# Patient Record
Sex: Female | Born: 1975 | Race: Black or African American | Hispanic: No | Marital: Single | Smoking: Former smoker
Health system: Southern US, Community
[De-identification: ages and names within clinical notes are randomized; demographics above are authoritative.]

## PROBLEM LIST (undated history)

## (undated) DIAGNOSIS — R519 Headache, unspecified: Secondary | ICD-10-CM

## (undated) DIAGNOSIS — G8929 Other chronic pain: Secondary | ICD-10-CM

## (undated) DIAGNOSIS — R011 Cardiac murmur, unspecified: Secondary | ICD-10-CM

## (undated) DIAGNOSIS — R51 Headache: Secondary | ICD-10-CM

## (undated) DIAGNOSIS — F419 Anxiety disorder, unspecified: Secondary | ICD-10-CM

## (undated) DIAGNOSIS — G4730 Sleep apnea, unspecified: Secondary | ICD-10-CM

## (undated) DIAGNOSIS — M1990 Unspecified osteoarthritis, unspecified site: Secondary | ICD-10-CM

## (undated) DIAGNOSIS — I639 Cerebral infarction, unspecified: Secondary | ICD-10-CM

## (undated) DIAGNOSIS — N946 Dysmenorrhea, unspecified: Secondary | ICD-10-CM

## (undated) DIAGNOSIS — D649 Anemia, unspecified: Secondary | ICD-10-CM

## (undated) HISTORY — DX: Dysmenorrhea, unspecified: N94.6

## (undated) HISTORY — DX: Headache, unspecified: R51.9

## (undated) HISTORY — PX: TUBAL LIGATION: SHX77

## (undated) HISTORY — DX: Headache: R51

## (undated) HISTORY — DX: Other chronic pain: G89.29

## (undated) HISTORY — DX: Cardiac murmur, unspecified: R01.1

## (undated) HISTORY — DX: Anemia, unspecified: D64.9

---

## 1998-01-20 ENCOUNTER — Emergency Department (HOSPITAL_COMMUNITY): Admission: EM | Admit: 1998-01-20 | Discharge: 1998-01-20 | Payer: Self-pay | Admitting: Emergency Medicine

## 1998-08-10 ENCOUNTER — Emergency Department (HOSPITAL_COMMUNITY): Admission: EM | Admit: 1998-08-10 | Discharge: 1998-08-10 | Payer: Self-pay | Admitting: Emergency Medicine

## 1998-12-23 ENCOUNTER — Emergency Department (HOSPITAL_COMMUNITY): Admission: EM | Admit: 1998-12-23 | Discharge: 1998-12-24 | Payer: Self-pay

## 1999-01-05 ENCOUNTER — Emergency Department (HOSPITAL_COMMUNITY): Admission: EM | Admit: 1999-01-05 | Discharge: 1999-01-05 | Payer: Self-pay | Admitting: Emergency Medicine

## 1999-01-19 ENCOUNTER — Inpatient Hospital Stay (HOSPITAL_COMMUNITY): Admission: AD | Admit: 1999-01-19 | Discharge: 1999-01-19 | Payer: Self-pay | Admitting: Obstetrics

## 1999-01-28 ENCOUNTER — Inpatient Hospital Stay (HOSPITAL_COMMUNITY): Admission: EM | Admit: 1999-01-28 | Discharge: 1999-01-28 | Payer: Self-pay | Admitting: Obstetrics

## 1999-09-06 ENCOUNTER — Inpatient Hospital Stay (HOSPITAL_COMMUNITY): Admission: AD | Admit: 1999-09-06 | Discharge: 1999-09-06 | Payer: Self-pay | Admitting: *Deleted

## 1999-11-18 ENCOUNTER — Encounter: Payer: Self-pay | Admitting: Obstetrics & Gynecology

## 1999-11-18 ENCOUNTER — Inpatient Hospital Stay (HOSPITAL_COMMUNITY): Admission: AD | Admit: 1999-11-18 | Discharge: 1999-11-18 | Payer: Self-pay | Admitting: *Deleted

## 1999-12-01 ENCOUNTER — Inpatient Hospital Stay (HOSPITAL_COMMUNITY): Admission: AD | Admit: 1999-12-01 | Discharge: 1999-12-01 | Payer: Self-pay | Admitting: Obstetrics & Gynecology

## 2000-01-30 ENCOUNTER — Emergency Department (HOSPITAL_COMMUNITY): Admission: EM | Admit: 2000-01-30 | Discharge: 2000-01-30 | Payer: Self-pay | Admitting: Emergency Medicine

## 2000-01-30 ENCOUNTER — Encounter: Payer: Self-pay | Admitting: Emergency Medicine

## 2000-02-03 ENCOUNTER — Inpatient Hospital Stay (HOSPITAL_COMMUNITY): Admission: AD | Admit: 2000-02-03 | Discharge: 2000-02-07 | Payer: Self-pay | Admitting: Obstetrics

## 2000-03-16 ENCOUNTER — Ambulatory Visit (HOSPITAL_COMMUNITY): Admission: RE | Admit: 2000-03-16 | Discharge: 2000-03-16 | Payer: Self-pay | Admitting: Obstetrics

## 2000-03-18 ENCOUNTER — Inpatient Hospital Stay (HOSPITAL_COMMUNITY): Admission: AD | Admit: 2000-03-18 | Discharge: 2000-03-18 | Payer: Self-pay | Admitting: Obstetrics

## 2000-03-25 ENCOUNTER — Observation Stay (HOSPITAL_COMMUNITY): Admission: AD | Admit: 2000-03-25 | Discharge: 2000-03-25 | Payer: Self-pay | Admitting: *Deleted

## 2000-03-25 ENCOUNTER — Encounter: Payer: Self-pay | Admitting: *Deleted

## 2000-04-01 ENCOUNTER — Inpatient Hospital Stay (HOSPITAL_COMMUNITY): Admission: AD | Admit: 2000-04-01 | Discharge: 2000-04-01 | Payer: Self-pay | Admitting: Orthopedic Surgery

## 2000-04-04 ENCOUNTER — Inpatient Hospital Stay (HOSPITAL_COMMUNITY): Admission: AD | Admit: 2000-04-04 | Discharge: 2000-04-04 | Payer: Self-pay | Admitting: *Deleted

## 2000-04-11 ENCOUNTER — Inpatient Hospital Stay (HOSPITAL_COMMUNITY): Admission: AD | Admit: 2000-04-11 | Discharge: 2000-04-14 | Payer: Self-pay | Admitting: Obstetrics

## 2000-04-13 ENCOUNTER — Encounter (INDEPENDENT_AMBULATORY_CARE_PROVIDER_SITE_OTHER): Payer: Self-pay

## 2000-07-20 ENCOUNTER — Emergency Department (HOSPITAL_COMMUNITY): Admission: EM | Admit: 2000-07-20 | Discharge: 2000-07-20 | Payer: Self-pay | Admitting: Emergency Medicine

## 2000-11-17 ENCOUNTER — Encounter: Payer: Self-pay | Admitting: Emergency Medicine

## 2000-11-17 ENCOUNTER — Emergency Department (HOSPITAL_COMMUNITY): Admission: EM | Admit: 2000-11-17 | Discharge: 2000-11-17 | Payer: Self-pay | Admitting: Emergency Medicine

## 2001-10-25 ENCOUNTER — Emergency Department (HOSPITAL_COMMUNITY): Admission: EM | Admit: 2001-10-25 | Discharge: 2001-10-25 | Payer: Self-pay | Admitting: Emergency Medicine

## 2001-12-22 ENCOUNTER — Encounter: Payer: Self-pay | Admitting: *Deleted

## 2001-12-22 ENCOUNTER — Emergency Department (HOSPITAL_COMMUNITY): Admission: EM | Admit: 2001-12-22 | Discharge: 2001-12-22 | Payer: Self-pay | Admitting: *Deleted

## 2002-02-16 ENCOUNTER — Emergency Department (HOSPITAL_COMMUNITY): Admission: EM | Admit: 2002-02-16 | Discharge: 2002-02-16 | Payer: Self-pay | Admitting: Emergency Medicine

## 2002-02-16 ENCOUNTER — Encounter: Payer: Self-pay | Admitting: Emergency Medicine

## 2002-02-19 ENCOUNTER — Emergency Department (HOSPITAL_COMMUNITY): Admission: EM | Admit: 2002-02-19 | Discharge: 2002-02-19 | Payer: Self-pay | Admitting: Emergency Medicine

## 2002-06-23 ENCOUNTER — Emergency Department (HOSPITAL_COMMUNITY): Admission: EM | Admit: 2002-06-23 | Discharge: 2002-06-23 | Payer: Self-pay | Admitting: Emergency Medicine

## 2002-07-02 ENCOUNTER — Emergency Department (HOSPITAL_COMMUNITY): Admission: EM | Admit: 2002-07-02 | Discharge: 2002-07-02 | Payer: Self-pay | Admitting: Emergency Medicine

## 2002-12-28 ENCOUNTER — Emergency Department (HOSPITAL_COMMUNITY): Admission: EM | Admit: 2002-12-28 | Discharge: 2002-12-28 | Payer: Self-pay | Admitting: Emergency Medicine

## 2003-01-09 ENCOUNTER — Emergency Department (HOSPITAL_COMMUNITY): Admission: EM | Admit: 2003-01-09 | Discharge: 2003-01-09 | Payer: Self-pay | Admitting: Emergency Medicine

## 2003-07-17 ENCOUNTER — Emergency Department (HOSPITAL_COMMUNITY): Admission: EM | Admit: 2003-07-17 | Discharge: 2003-07-17 | Payer: Self-pay | Admitting: Emergency Medicine

## 2004-06-18 ENCOUNTER — Emergency Department (HOSPITAL_COMMUNITY): Admission: EM | Admit: 2004-06-18 | Discharge: 2004-06-19 | Payer: Self-pay | Admitting: Emergency Medicine

## 2004-06-19 IMAGING — CR DG CHEST 2V
2 series · 2 of 2 positions shown · non-contrast
Comparison: none

CLINICAL DATA: Chest pain

CHEST - 2 VIEW

[view not recorded (1 of 2)]
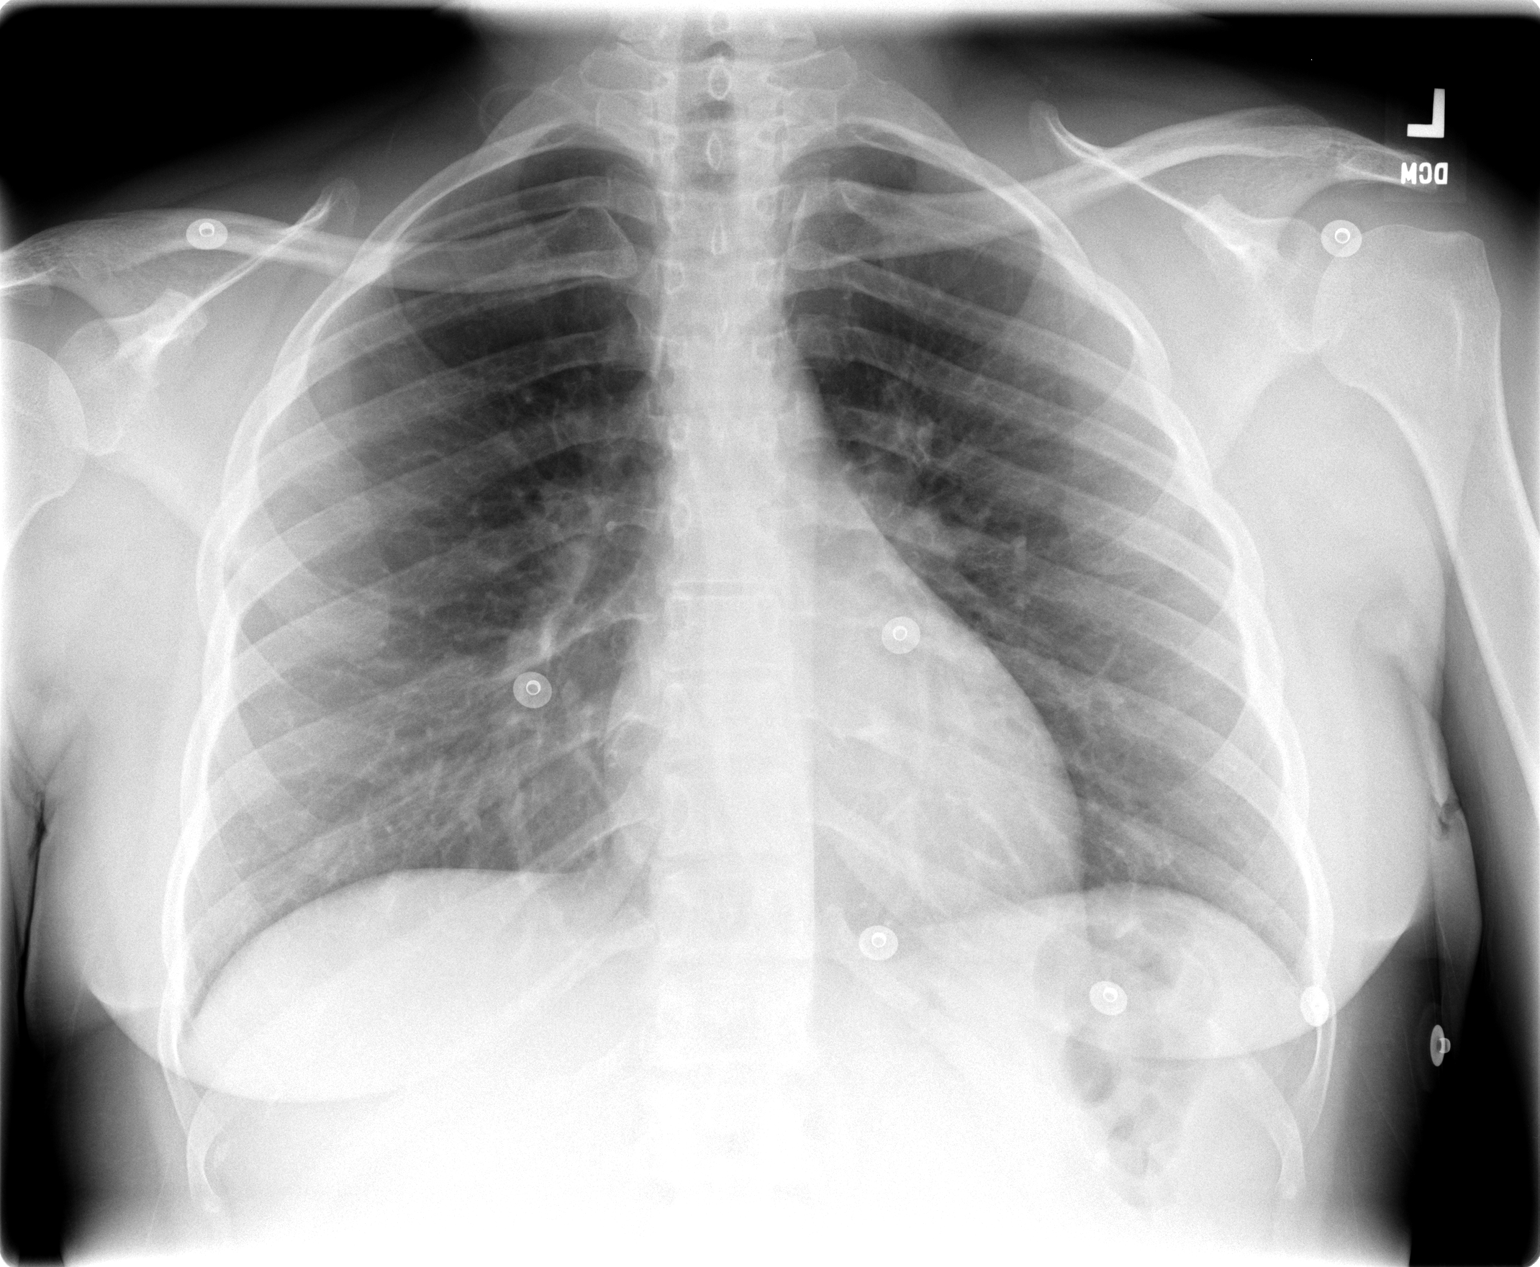

[view not recorded (2 of 2)]
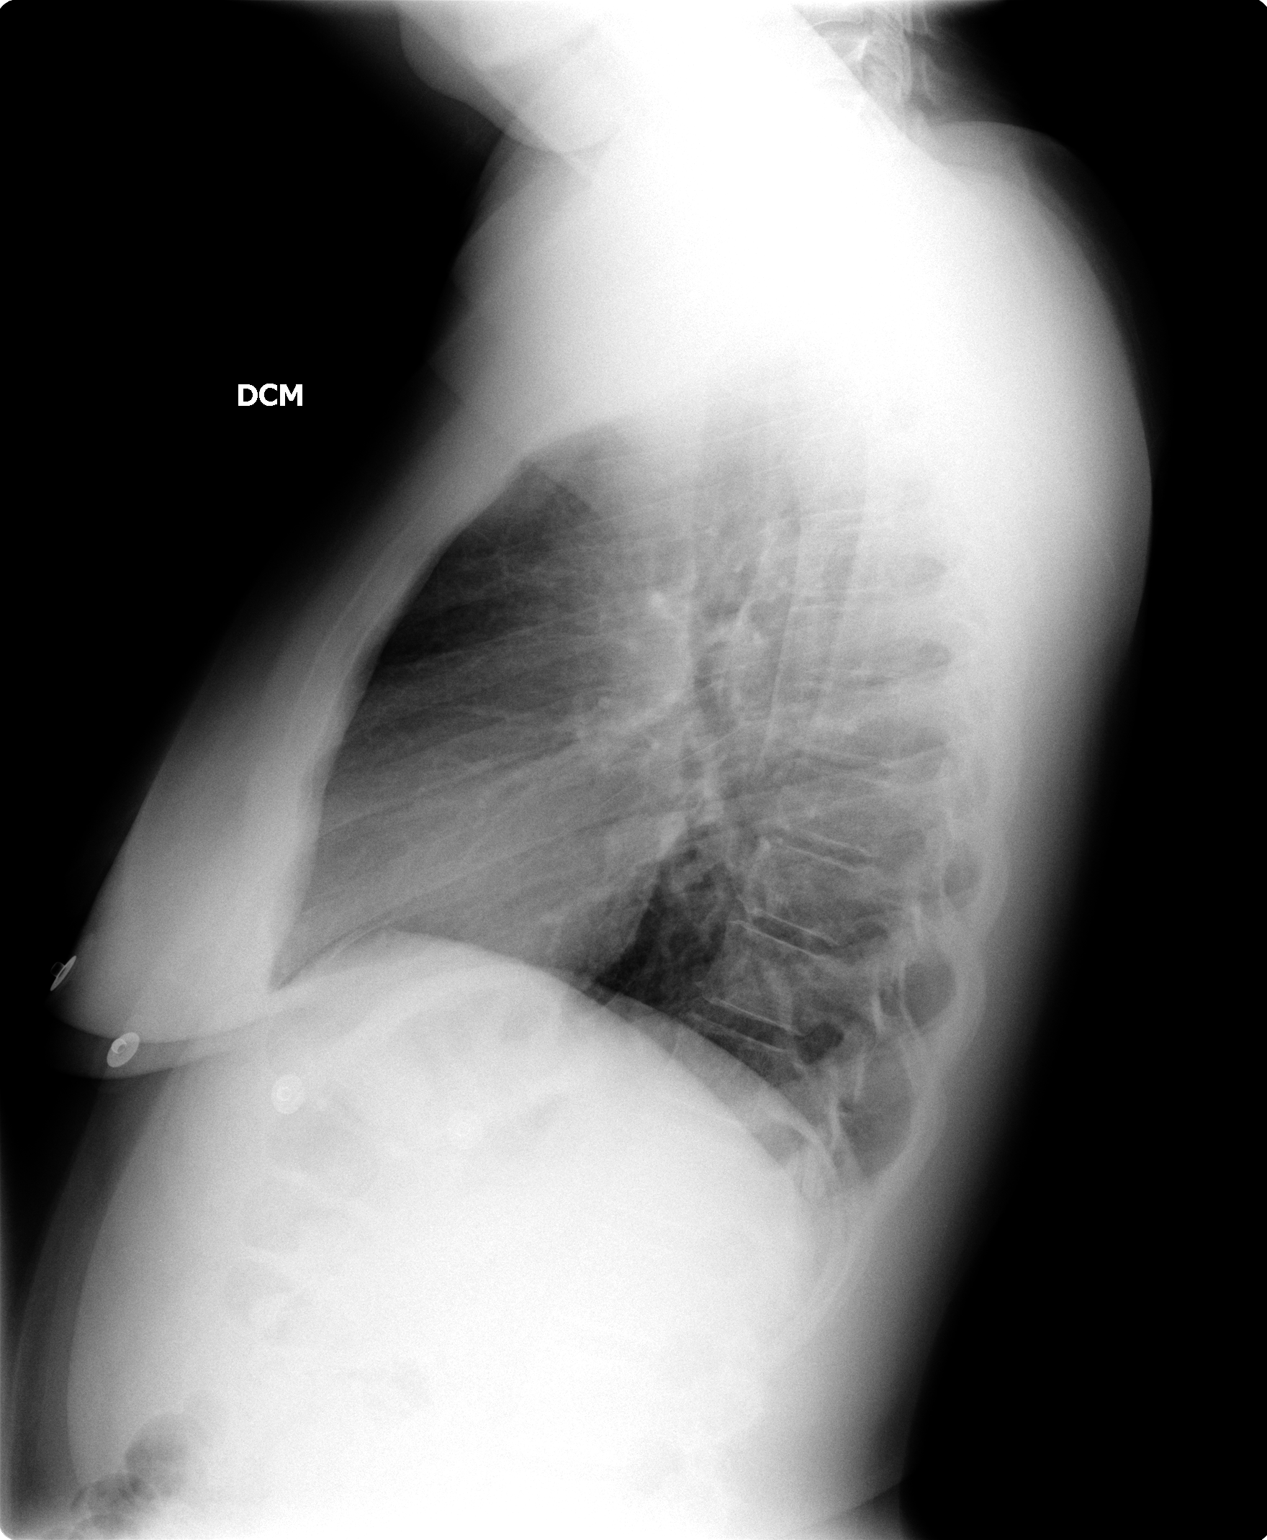

[2 of 2 positions shown; findings below may reference images not displayed]

FINDINGS: The heart size and mediastinal contours are within normal limits.  Both lungs are clear.
The visualized skeletal structures are unremarkable.

IMPRESSION

No active cardiopulmonary disease.

## 2004-10-28 ENCOUNTER — Emergency Department (HOSPITAL_COMMUNITY): Admission: EM | Admit: 2004-10-28 | Discharge: 2004-10-28 | Payer: Self-pay | Admitting: Emergency Medicine

## 2004-10-30 ENCOUNTER — Emergency Department (HOSPITAL_COMMUNITY): Admission: EM | Admit: 2004-10-30 | Discharge: 2004-10-30 | Payer: Self-pay | Admitting: Family Medicine

## 2007-02-25 ENCOUNTER — Emergency Department (HOSPITAL_COMMUNITY): Admission: EM | Admit: 2007-02-25 | Discharge: 2007-02-26 | Payer: Self-pay | Admitting: Emergency Medicine

## 2008-02-25 ENCOUNTER — Emergency Department (HOSPITAL_COMMUNITY): Admission: EM | Admit: 2008-02-25 | Discharge: 2008-02-25 | Payer: Self-pay | Admitting: Family Medicine

## 2009-04-13 ENCOUNTER — Emergency Department (HOSPITAL_COMMUNITY): Admission: EM | Admit: 2009-04-13 | Discharge: 2009-04-13 | Payer: Self-pay | Admitting: Emergency Medicine

## 2009-04-21 ENCOUNTER — Emergency Department (HOSPITAL_COMMUNITY): Admission: EM | Admit: 2009-04-21 | Discharge: 2009-04-21 | Payer: Self-pay | Admitting: Family Medicine

## 2009-04-23 ENCOUNTER — Emergency Department (HOSPITAL_COMMUNITY): Admission: EM | Admit: 2009-04-23 | Discharge: 2009-04-23 | Payer: Self-pay | Admitting: Family Medicine

## 2009-11-21 IMAGING — CR DG KNEE COMPLETE 4+V*L*
4 series · 4 of 4 positions shown · non-contrast
Comparison: None.

CLINICAL DATA: Left knee pain

LEFT KNEE - COMPLETE 4+ VIEW

[t knee ap left]
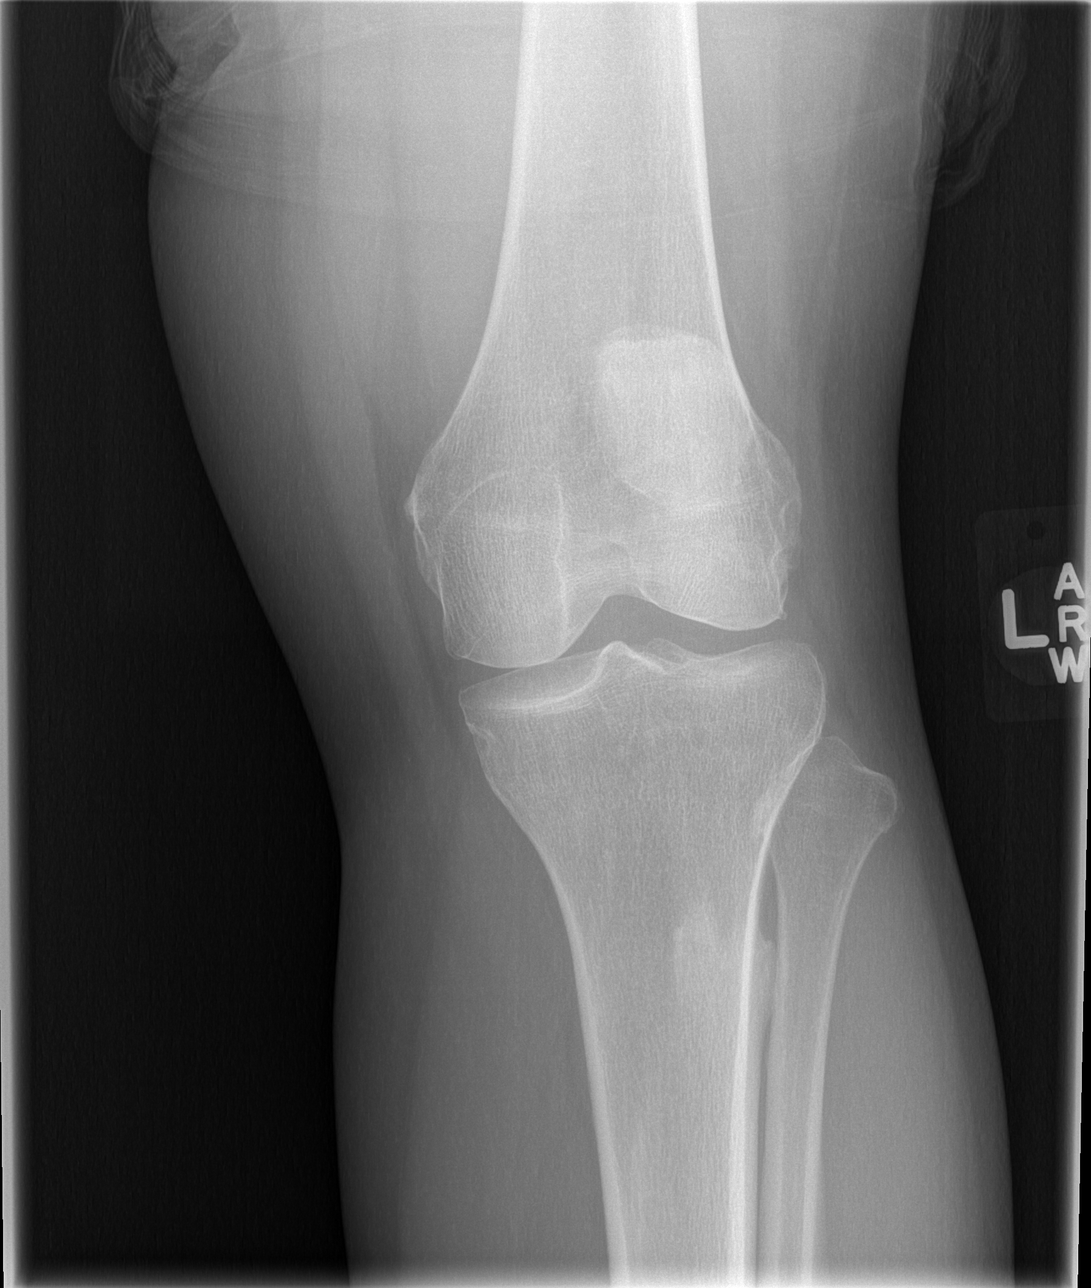

[t knee oblique left (1 of 2)]
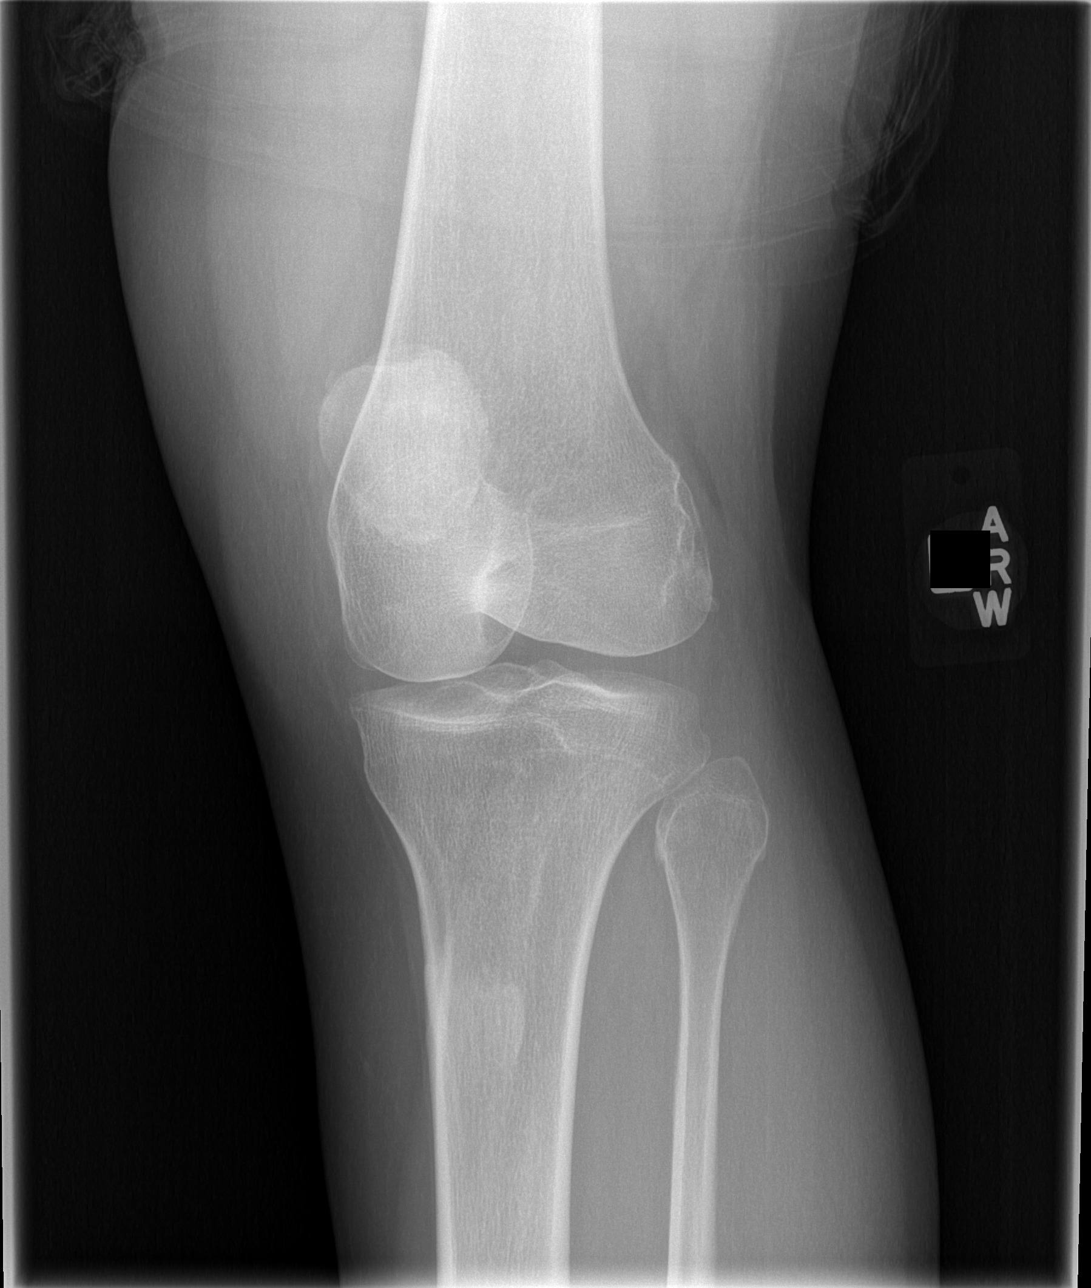

[t knee oblique left (2 of 2)]
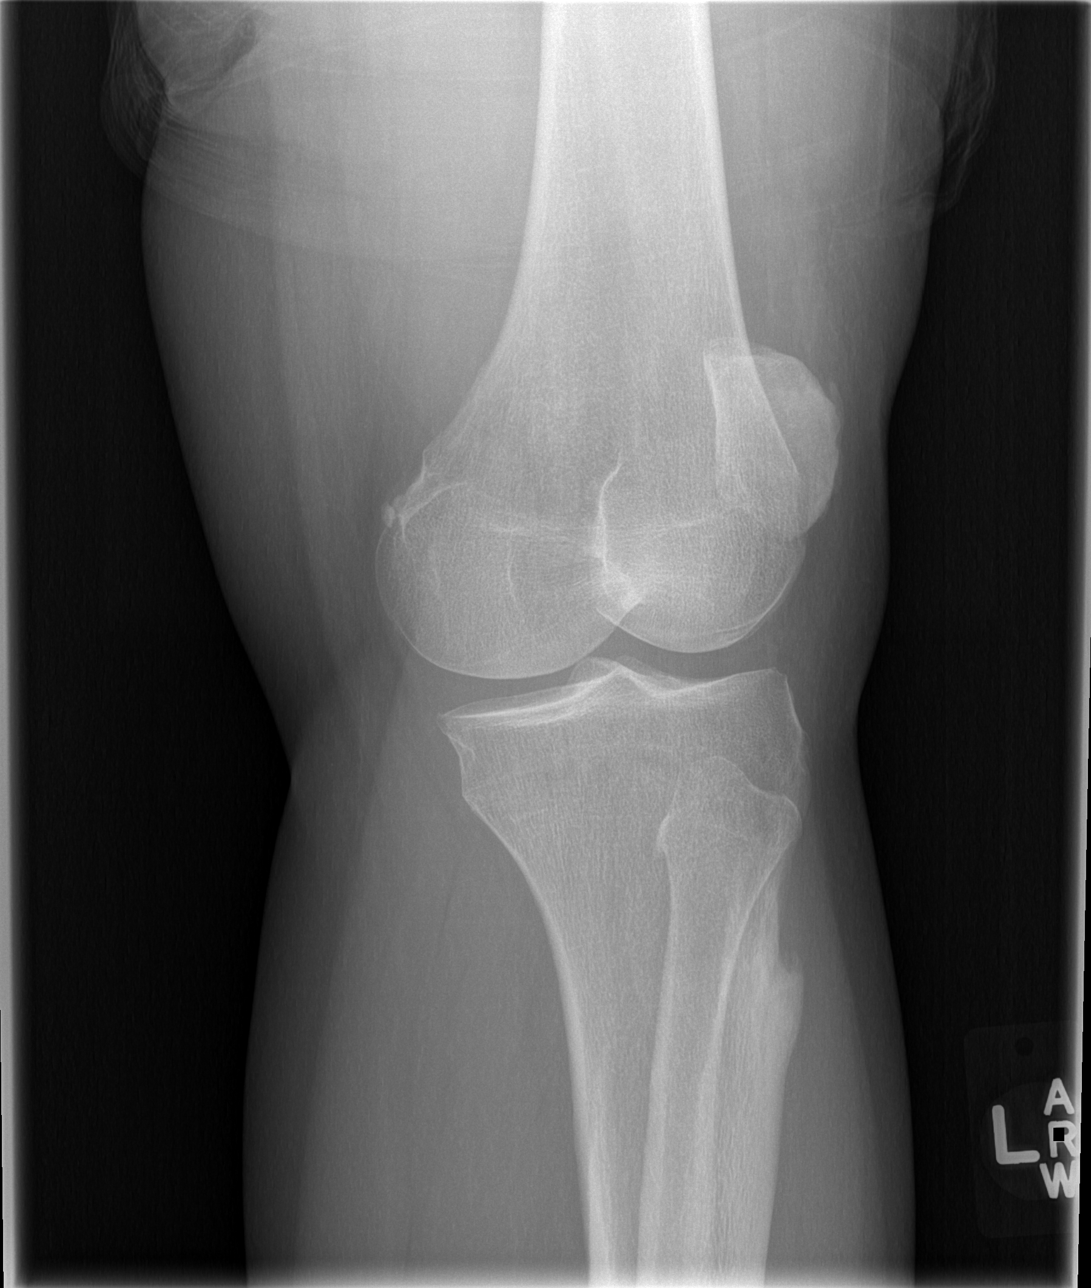

[t knee lat left]
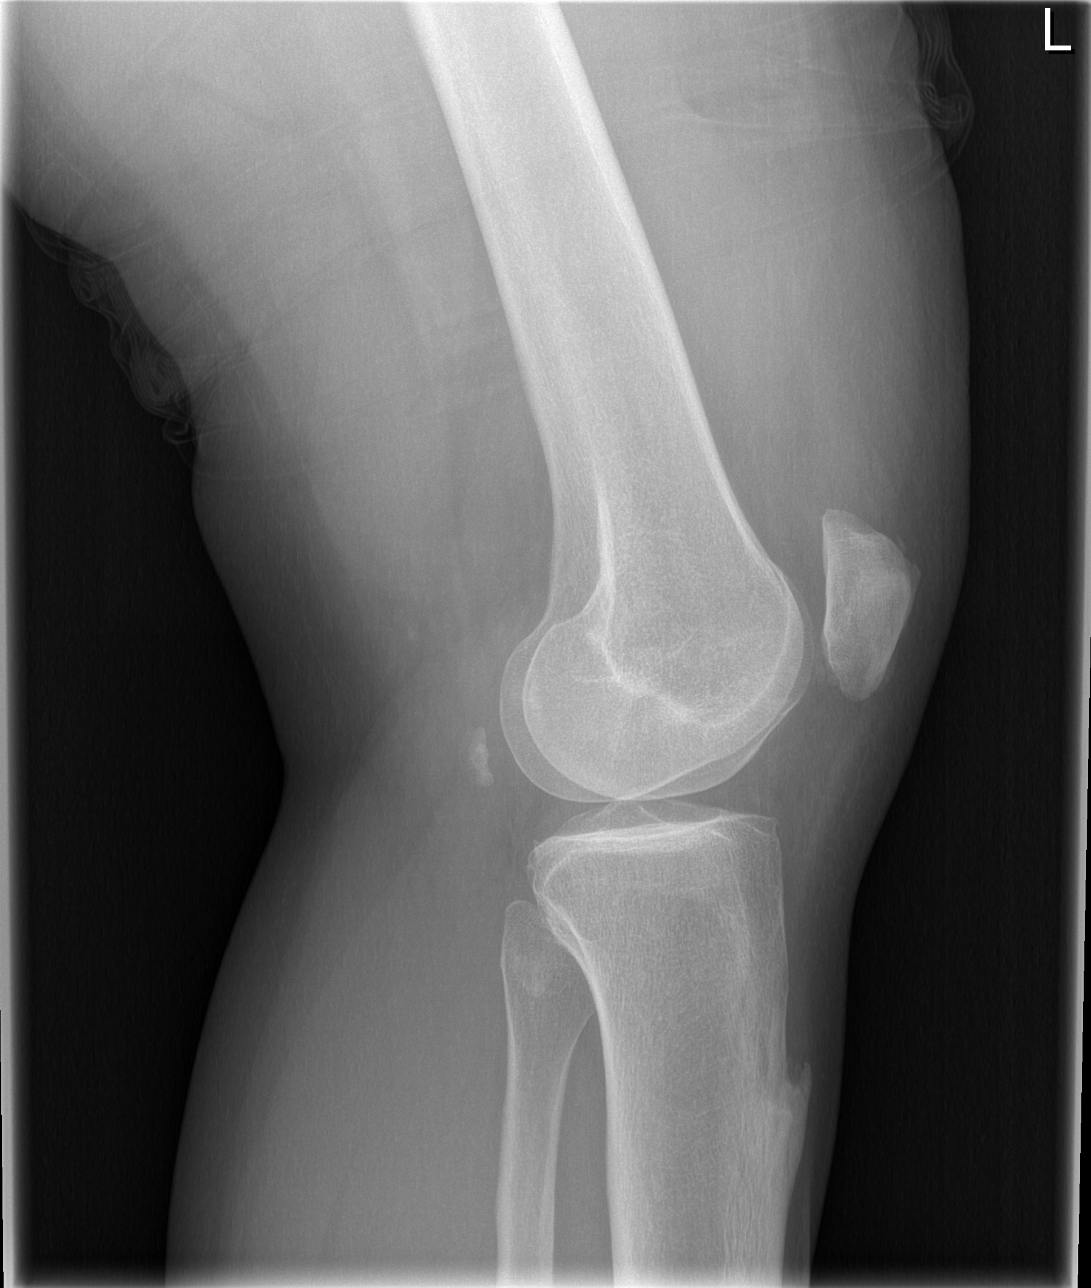

[4 of 4 positions shown; findings below may reference images not displayed]

FINDINGS: Four views of the left knee submitted.  No acute fracture
or subluxation.  Small joint effusion is noted.
IMPRESSION: No acute fracture or subluxation.  Small joint effusion.

## 2010-02-01 ENCOUNTER — Emergency Department (HOSPITAL_COMMUNITY): Admission: EM | Admit: 2010-02-01 | Discharge: 2010-02-01 | Payer: Self-pay | Admitting: Emergency Medicine

## 2010-04-11 ENCOUNTER — Telehealth: Payer: Self-pay | Admitting: Family Medicine

## 2010-09-27 NOTE — Progress Notes (Signed)
Summary: no show for new pt appt  Phone Note Other Incoming   Summary of Call: no show    

## 2010-12-10 ENCOUNTER — Emergency Department (HOSPITAL_COMMUNITY)
Admission: EM | Admit: 2010-12-10 | Discharge: 2010-12-10 | Disposition: A | Discharge status: Home / Self Care | Payer: Worker's Compensation | Attending: Emergency Medicine | Admitting: Emergency Medicine

## 2010-12-10 ENCOUNTER — Emergency Department (HOSPITAL_COMMUNITY): Payer: Worker's Compensation

## 2010-12-10 DIAGNOSIS — Y9269 Other specified industrial and construction area as the place of occurrence of the external cause: Secondary | ICD-10-CM | POA: Insufficient documentation

## 2010-12-10 DIAGNOSIS — S63509A Unspecified sprain of unspecified wrist, initial encounter: Secondary | ICD-10-CM | POA: Insufficient documentation

## 2010-12-10 DIAGNOSIS — Y990 Civilian activity done for income or pay: Secondary | ICD-10-CM | POA: Insufficient documentation

## 2010-12-10 DIAGNOSIS — X500XXA Overexertion from strenuous movement or load, initial encounter: Secondary | ICD-10-CM | POA: Insufficient documentation

## 2010-12-10 IMAGING — CR DG WRIST COMPLETE 3+V*L*
4 series · 4 of 4 positions shown · non-contrast
Comparison: None.

CLINICAL DATA: Hyperextended left wrist during transferred patient.
Radiating pain from the ulnar aspect.

LEFT WRIST - COMPLETE 3+ VIEW

[x wrist pa left]
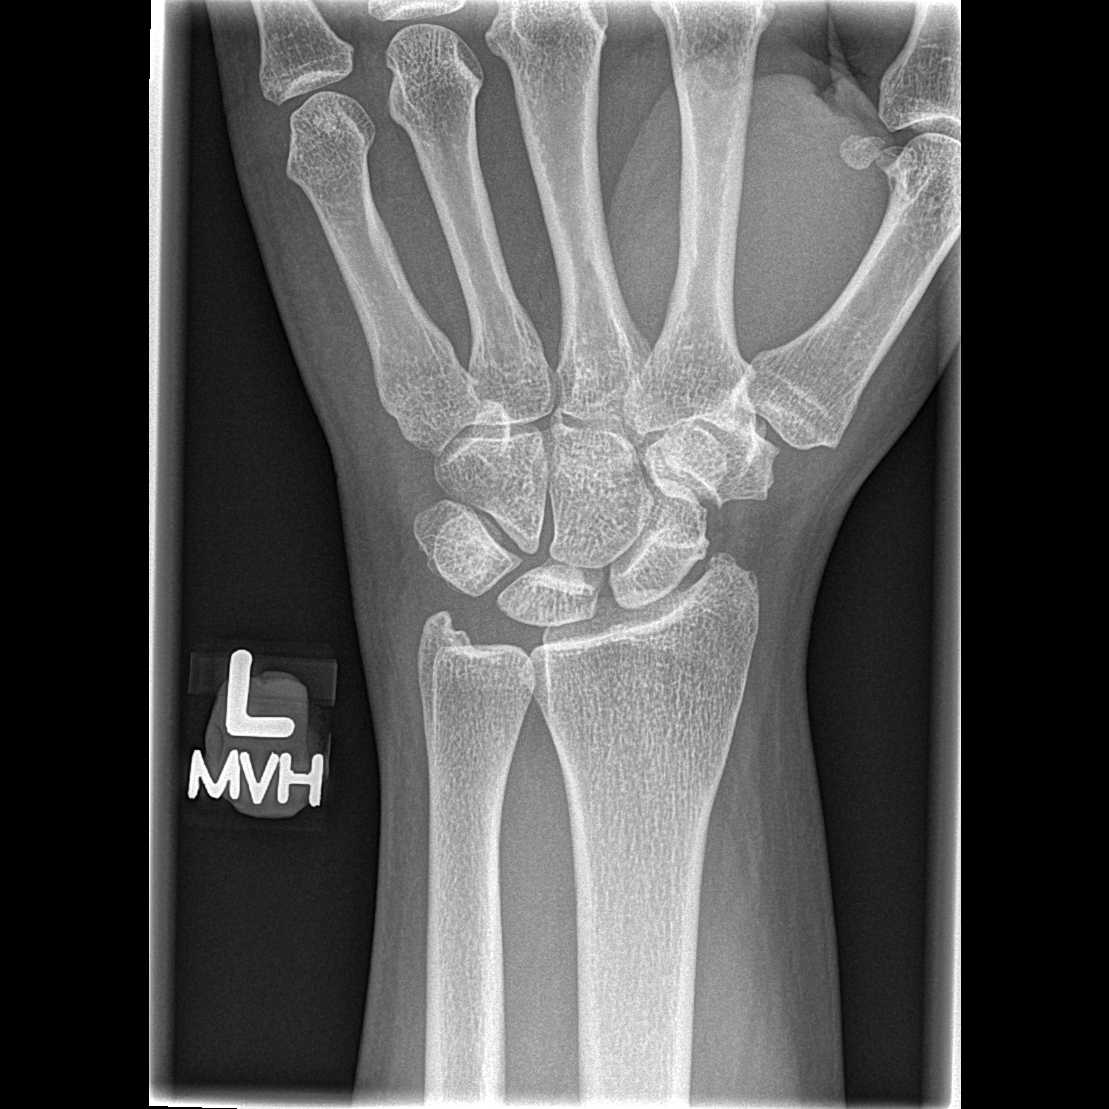

[x wrist obl left]
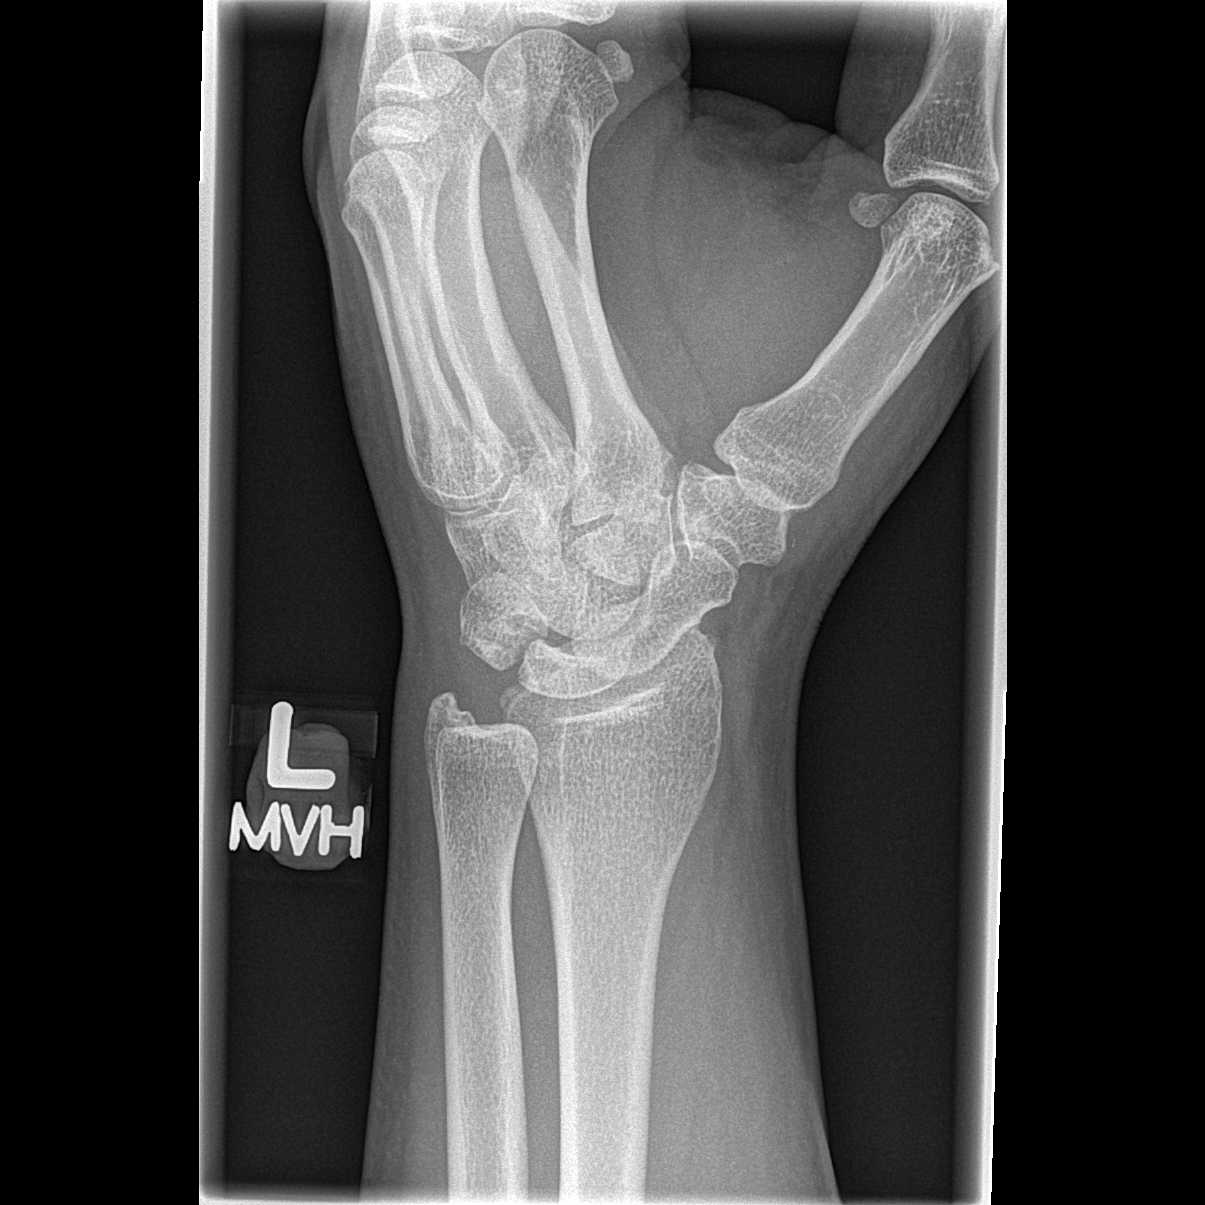

[x wrist lat left]
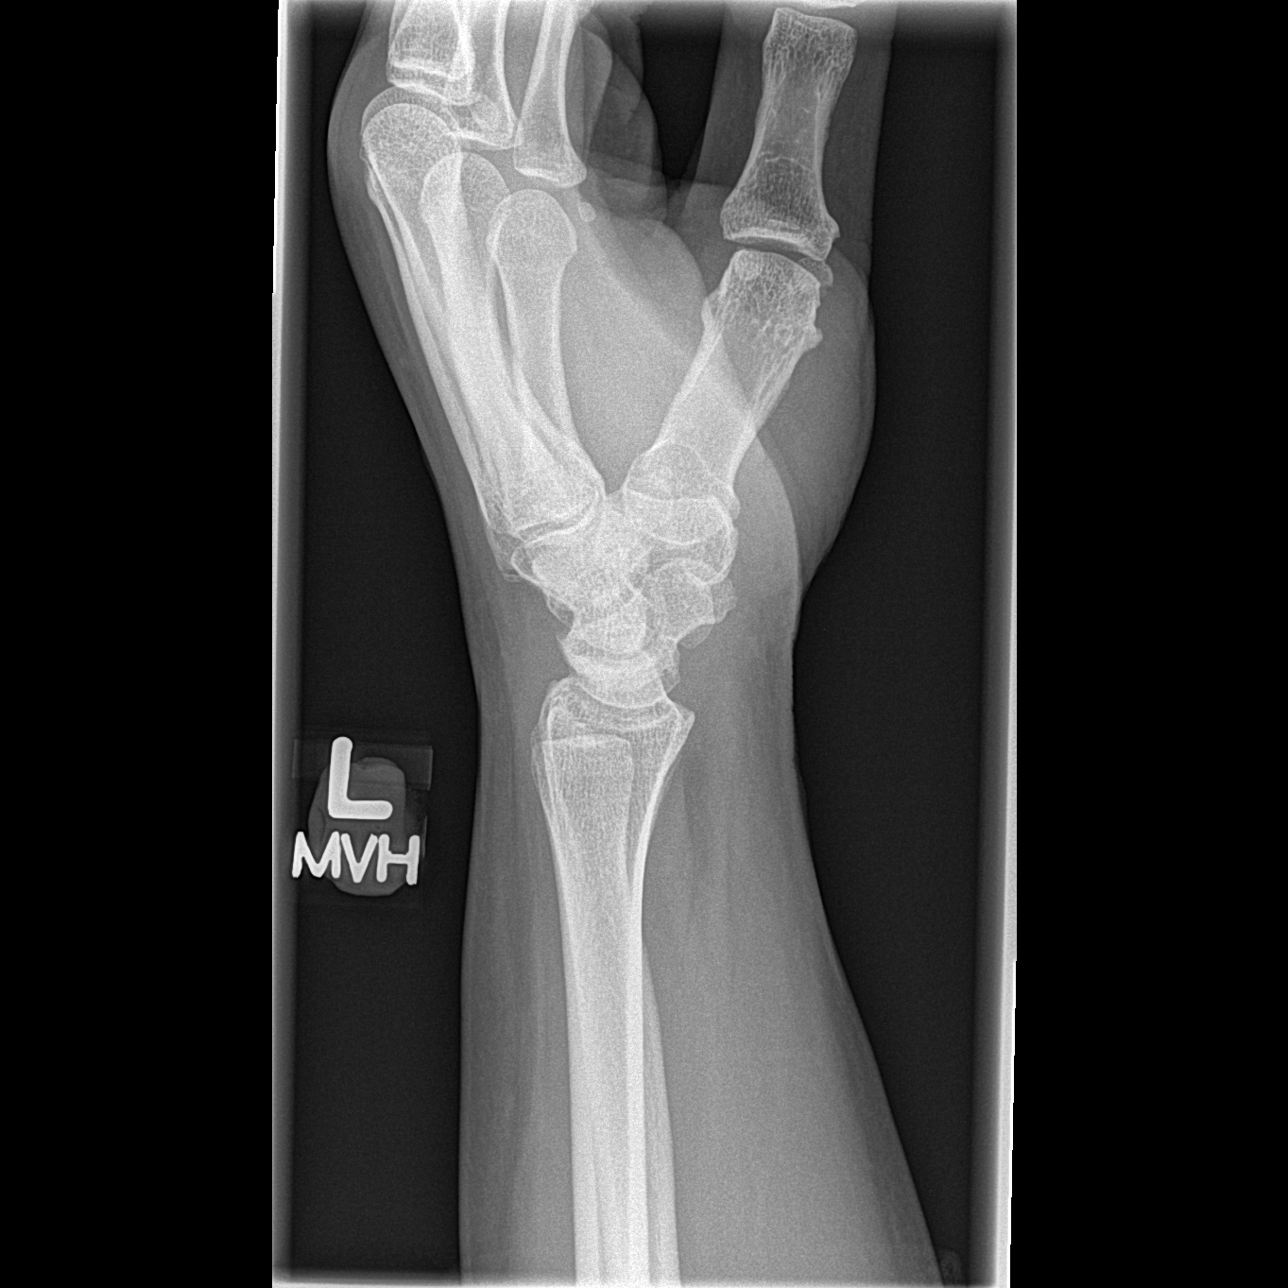

[x wrist navicular]
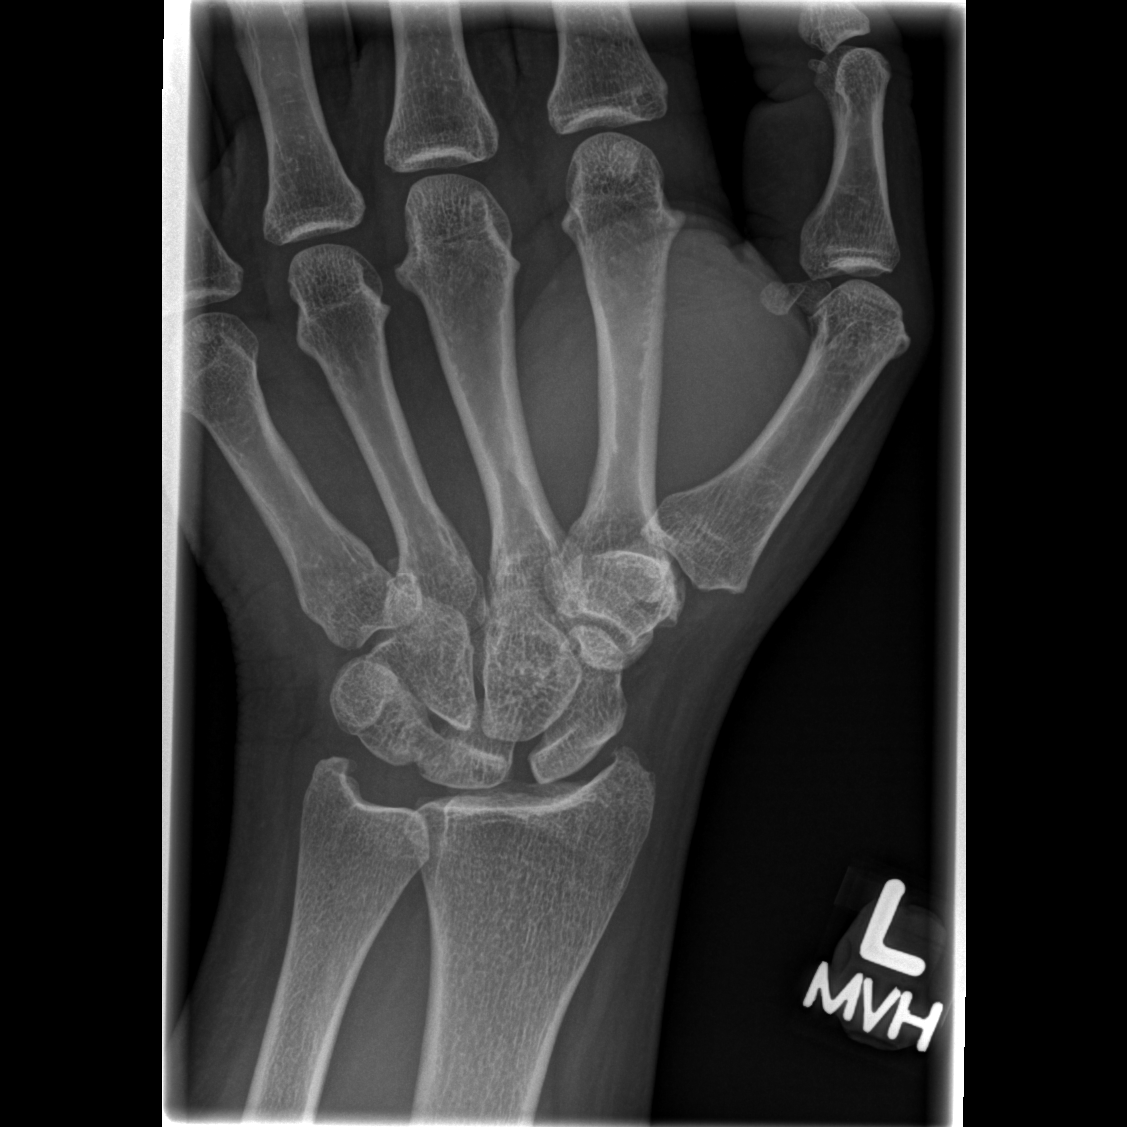

[4 of 4 positions shown; findings below may reference images not displayed]

FINDINGS: Mild degenerative changes of the radial carpal and STT
joints.  No evidence of acute fracture.  There is widening of the
scapholunate space suggesting scapholunate ligament injury.  No
focal bone lesion.  Bone cortex and bone matrix appear intact.
IMPRESSION: Widening of the scapholunate space suggesting scapholunate ligament
injury.  No evidence of acute fracture.  Mild degenerative changes.

## 2011-01-13 NOTE — Discharge Summary (Signed)
[facility]  Patient:    Angela Hahn, Angela Hahn                      MRN: [id number] Adm. Date:  [id number] Disc. Date: [id number] Attending:  Tammi Sou Dictator:   Creed Copper. Cornelius Moras, M.D.                           Discharge Summary  DISCHARGE DIAGNOSES:          1. Bacterial vaginosis with ascending infection                                  leading to preterm contractions.                               2. Intrauterine pregnancy at 31-5/7 weeks.  DISCHARGE MEDICATIONS:        1. Augmentin 875 mg p.o. b.i.d.  FOLLOW-UP:                    The patient is to keep her previously scheduled appointment with her obstetrician.  HISTORY OF PRESENT ILLNESS:   The patient had contractions on evening prior to admission.  Now pain is decreased in her abdomen, but started to have low back ain the day of admission.  Decreased fetal movement x 3 days and positive contractions. The patient denied any gush of fluid, no bleeding, and no change in her vaginal  discharge.  PHYSICAL EXAMINATION:         VITAL SIGNS: Temperature 99.6, blood pressure 126/67, pulse 117, fetal heart tones 170s, positive accellerations, no decellerations.  Toko showed uterine irritability.  CHEST: Clear to auscultation bilaterally. HEART: Regular rate and rhythm with no murmurs.  ABDOMEN: Soft, diffusely tender, especially in the right upper quadrant.  No CVA tenderness.  EXTREMITIES: Deep tendon reflexes 2/2, pulses 2+, no edema, and no clonus.  PELVIC: Examination of the cervix revealed 1 cm of dilation, 0 effacement, and -3 station.  Wet prep showed too many to count bacteria and white blood cells.  HOSPITAL COURSE:              The patient was admitted with bacterial vaginosis  with concern for ascending infection leading to preterm contractions.  She was placed on Unasyn 3 grams every six hours IV x 72 hours and ibuprofen 600 mg every six hours x 48 hours.  The patient did  well during this hospital course, did not have any further increase in her irritability once antibiotics were started. Contractions ceased and pain ceased.  The patient is discharged on February 07, 2000, with antibiotics for one week.  She is to follow up with her regular M.D. as previously noted. DD:  02/07/00 TD:  02/08/00 Job: [id number]

## 2011-01-13 NOTE — Op Note (Signed)
[facility]  Patient:    Angela Hahn, Angela Hahn                      MRN: [id number] Adm. Date:  [id number] Attending:  Tammi Sou Dictator:   Jamey Reas, M.D. CC:         Roseanna Rainbow, M.D.                           Operative Report  DATE OF BIRTH:                [birth year]  PREOPERATIVE DIAGNOSIS:       Desire for permanent sterilization.  POSTOPERATIVE DIAGNOSIS:      Desire for permanent sterilization.  PROCEDURE:                    Postpartum bilateral tubal ligation.  SURGEON:                      Jamey Reas, M.D.  FIRST ASSISTANT:              Roseanna Rainbow, M.D.  FINDINGS:                     Normal tubes other than minimal adhesions to the omentum on the right.  ANESTHESIA:                   General endotracheal.  FLUIDS:                       Lactated Ringers 700 cc.  ESTIMATED BLOOD LOSS:         Less than 50 cc.  SPECIMENS:                    Bilateral portions of tubes.  DESCRIPTION OF PROCEDURE:     The patient was brought to the range of motion after consent was obtained and was placed in the supine position on the operating room table.  Epidural anesthesia was attempted to be achieved without success.  It was then converted to general endotracheal anesthesia. The patient was put to sleep without complications.  The area was prepped and draped in normal sterile fashion.  A #10 blade scalpel was used to make a 4 cm incision just below the umbilicus.  This incision was extended down initially to the fascia and then through the fascia into the peritoneum.  The left tube was found, traced to the end using Babcocks to the fimbria.  A normal appearing tube was then traced back to 1/3 of the length from uterus to fimbria.  Two ties were placed first then second proximal to the first tie. Metzenbaum scissors were used to transect the mesosalpinx and then cut the tied portion of the  tube.  It was hemostatic and was released back into the peritoneal cavity.  The same procedure was used on the right.  The tube was traced to visualized fimbria, then traced back to 1/3 the distance from the uterus to the fimbria.  Using Babcocks, two ties were then placed on the right tube.  Metzenbaums scissors were used to transect the mesosalpinx.   The tube was then cut above the ties.  It was also hemostatic and was released back into the peritoneal cavity.  Then, 4-0 Vicryl suture was used to  close the peritoneal/fascial plane without complication using a running suture.  The subcuticular stitch was then placed using 4-0 Vicryl suture without complication.  The patient was hemostatic adequate was doing well and stable condition when taken to the recovery room.  There were no complications to the procedure. DD:  04/13/00 TD:  04/13/00 Job: [id number]

## 2011-03-27 ENCOUNTER — Ambulatory Visit (INDEPENDENT_AMBULATORY_CARE_PROVIDER_SITE_OTHER): Payer: BC Managed Care – PPO | Admitting: Family Medicine

## 2011-03-27 ENCOUNTER — Encounter: Payer: Self-pay | Admitting: Family Medicine

## 2011-03-27 VITALS — BP 116/78 | HR 120 | Temp 98.3°F | Ht 67.5 in | Wt 201.0 lb

## 2011-03-27 DIAGNOSIS — N920 Excessive and frequent menstruation with regular cycle: Secondary | ICD-10-CM

## 2011-03-27 DIAGNOSIS — J039 Acute tonsillitis, unspecified: Secondary | ICD-10-CM

## 2011-03-27 MED ORDER — NORGESTIM-ETH ESTRAD TRIPHASIC 0.18/0.215/0.25 MG-35 MCG PO TABS: 1.0000 | ORAL_TABLET | Freq: Every day | ORAL | Status: DC

## 2011-03-27 MED ORDER — CEPHALEXIN 500 MG PO CAPS: 500.0000 mg | ORAL_CAPSULE | Freq: Three times a day (TID) | ORAL | Status: AC

## 2011-03-27 NOTE — Progress Notes (Signed)
  Subjective:    Patient ID: Angela Hahn, female    DOB: 1975/11/26, 35 y.o.   MRN: [id number]  HPI 35 yr old female to establish with [place] and to discuss 2 issues. First she has a hx of irregular and heavy menses. She took BCP for several years, and these worked very well for her. [facility]. She stopped the BCP one year ago, and now her cycles have been off for the past 6 months. Also one week ago her right tonsil swelled up and has been painful ever since. She has a mild ST, no fever or cough.    Review of Systems  Constitutional: Negative.   HENT: Positive for sore throat. Negative for congestion, postnasal drip and sinus pressure.   Respiratory: Negative.   Cardiovascular: Negative.        Objective:   Physical Exam  Constitutional: She appears well-developed and well-nourished.  HENT:  Right Ear: External ear normal.  Left Ear: External ear normal.  Nose: Nose normal.  Mouth/Throat: No oropharyngeal exudate.       The right tonsil is swollen and red  Eyes: Conjunctivae are normal.  Neck: Normal range of motion. Neck supple. No thyromegaly present.  Pulmonary/Chest: Effort normal and breath sounds normal.  Lymphadenopathy:    She has no cervical adenopathy.          Assessment & Plan:   Treat the tonsillitis with Keflex. Get back on a BCP

## 2011-05-24 ENCOUNTER — Telehealth: Payer: Self-pay | Admitting: Family Medicine

## 2011-05-24 NOTE — Telephone Encounter (Addendum)
Pt is going on 8 day cruise requesting seasickness patches call into [facility] [address] [phone]

## 2011-05-25 LAB — POCT I-STAT, CHEM 8
Calcium, Ion: 1.27
Chloride: 104
Creatinine, Ser: 0.9
Glucose, Bld: 96
Potassium: 3.7

## 2011-05-26 MED ORDER — SCOPOLAMINE 1 MG/3DAYS TD PT72: 1.0000 | MEDICATED_PATCH | TRANSDERMAL | Status: DC

## 2011-05-26 NOTE — Telephone Encounter (Signed)
Call in transdermal Scopolamine to apply one patch every 3 days , #5 with 5 rf

## 2011-05-26 NOTE — Telephone Encounter (Signed)
Rx sent to pharmacy   

## 2011-06-08 ENCOUNTER — Ambulatory Visit (INDEPENDENT_AMBULATORY_CARE_PROVIDER_SITE_OTHER): Payer: BC Managed Care – PPO | Admitting: Family Medicine

## 2011-06-08 ENCOUNTER — Encounter: Payer: Self-pay | Admitting: Family Medicine

## 2011-06-08 VITALS — BP 120/74 | Temp 98.5°F | Wt 204.0 lb

## 2011-06-08 DIAGNOSIS — J45909 Unspecified asthma, uncomplicated: Secondary | ICD-10-CM

## 2011-06-08 MED ORDER — AZITHROMYCIN 250 MG PO TABS: ORAL_TABLET | ORAL | Status: AC

## 2011-06-08 MED ORDER — METHYLPREDNISOLONE ACETATE 80 MG/ML IJ SUSP
80.0000 mg | Freq: Once | INTRAMUSCULAR | Status: AC
  Administered 2011-06-08: 80 mg via INTRAMUSCULAR

## 2011-06-08 NOTE — Progress Notes (Signed)
  Subjective:    Patient ID: Angela Hahn, female    DOB: 1976-03-13, 35 y.o.   MRN: [id number]  HPI Acute visit. Onset of illness 3 nights ago Monday night. She's had cough with minimal nasal congestion. Cough productive of yellow sputum. Fever up to 101 yesterday. Significant bilateral rib pain from coughing. Has tried multiple medications including Tylenol, Advil, TheraFlu, and NyQuil without much relief. Some mild wheezing off-and-on. Quit smoking 4 months ago. No history of asthma. No dyspnea at rest. Denies any nausea, vomiting, or diarrhea   Review of Systems  Constitutional: Positive for fever, chills and fatigue.  HENT: Negative for sore throat and neck pain.   Respiratory: Positive for cough and wheezing. Negative for shortness of breath.   Cardiovascular: Negative for chest pain.  Neurological: Negative for syncope and headaches.       Objective:   Physical Exam  Constitutional: She appears well-developed and well-nourished.  HENT:  Right Ear: External ear normal.  Left Ear: External ear normal.  Mouth/Throat: Oropharynx is clear and moist.  Neck: Neck supple.  Cardiovascular: Normal rate and regular rhythm.   Pulmonary/Chest:       Patient has some faint diffuse wheezes. No retractions. No rales. Normal respiratory rate. Pulse oximetry 97%  Musculoskeletal: She exhibits no edema.  Lymphadenopathy:    She has no cervical adenopathy.  Neurological: She is alert.          Assessment & Plan:  Acute bronchitis. Mild reactive airway component. Depo-Medrol 80 mg given. No antibiotic at this time but start Zithromax if she has continued productive cough or any recurrent fever

## 2011-06-12 ENCOUNTER — Encounter: Payer: Self-pay | Admitting: Family Medicine

## 2011-06-13 LAB — POCT PREGNANCY, URINE
Operator id: 29459
Preg Test, Ur: NEGATIVE

## 2011-06-13 LAB — BASIC METABOLIC PANEL
BUN: 10
Chloride: 107
Creatinine, Ser: 0.78
Glucose, Bld: 105 — ABNORMAL HIGH
Potassium: 4.1

## 2011-06-13 LAB — URINALYSIS, ROUTINE W REFLEX MICROSCOPIC
Bilirubin Urine: NEGATIVE
Glucose, UA: NEGATIVE
Hgb urine dipstick: NEGATIVE
Specific Gravity, Urine: 1.023
pH: 7.5

## 2011-06-15 ENCOUNTER — Encounter: Payer: Self-pay | Admitting: Family Medicine

## 2011-07-19 ENCOUNTER — Telehealth: Payer: Self-pay | Admitting: Family Medicine

## 2011-07-19 NOTE — Telephone Encounter (Signed)
No she was seen 5 weeks ago. If she is still coughing, she needs another OV

## 2011-07-19 NOTE — Telephone Encounter (Signed)
Pt was given a zpak rx last month. She calls in today stating that the pharmacy lost her rx. She is requesting another [facility] to [facility].

## 2011-07-21 NOTE — Telephone Encounter (Signed)
lmoam with her instructions as indicated below. Thanks.

## 2011-07-25 ENCOUNTER — Encounter: Payer: Self-pay | Admitting: Family Medicine

## 2011-08-17 ENCOUNTER — Encounter: Payer: Self-pay | Admitting: Family Medicine

## 2011-09-19 ENCOUNTER — Encounter: Payer: Self-pay | Admitting: Family Medicine

## 2011-09-28 ENCOUNTER — Ambulatory Visit (INDEPENDENT_AMBULATORY_CARE_PROVIDER_SITE_OTHER): Payer: BC Managed Care – PPO | Admitting: Family

## 2011-09-28 ENCOUNTER — Other Ambulatory Visit: Payer: Self-pay | Admitting: *Deleted

## 2011-09-28 ENCOUNTER — Ambulatory Visit (INDEPENDENT_AMBULATORY_CARE_PROVIDER_SITE_OTHER)
Admission: RE | Admit: 2011-09-28 | Discharge: 2011-09-28 | Disposition: A | Discharge status: Home / Self Care | Payer: BC Managed Care – PPO | Source: Ambulatory Visit | Attending: Family | Admitting: Family

## 2011-09-28 ENCOUNTER — Encounter: Payer: Self-pay | Admitting: Family

## 2011-09-28 VITALS — BP 144/94 | HR 88 | Temp 97.9°F | Wt 210.0 lb

## 2011-09-28 DIAGNOSIS — R071 Chest pain on breathing: Secondary | ICD-10-CM

## 2011-09-28 LAB — CBC WITH DIFFERENTIAL/PLATELET
Basophils Relative: 0.9 % (ref 0.0–3.0)
Eosinophils Relative: 2 % (ref 0.0–5.0)
HCT: 30.5 % — ABNORMAL LOW (ref 36.0–46.0)
Hemoglobin: 9.7 g/dL — ABNORMAL LOW (ref 12.0–15.0)
MCV: 66 fl — ABNORMAL LOW (ref 78.0–100.0)
Monocytes Absolute: 0.5 10*3/uL (ref 0.1–1.0)
Neutro Abs: 3.7 10*3/uL (ref 1.4–7.7)
Neutrophils Relative %: 61.9 % (ref 43.0–77.0)
RBC: 4.63 Mil/uL (ref 3.87–5.11)
WBC: 6 10*3/uL (ref 4.5–10.5)

## 2011-09-28 LAB — BASIC METABOLIC PANEL
Chloride: 103 mEq/L (ref 96–112)
Creatinine, Ser: 0.7 mg/dL (ref 0.4–1.2)
Potassium: 3.9 mEq/L (ref 3.5–5.1)
Sodium: 139 mEq/L (ref 135–145)

## 2011-09-28 IMAGING — CT CT ANGIO CHEST
2 of 6 series · 19 of 36 positions shown · IV contrast (omnipaque)
Comparison: [DATE].

CLINICAL DATA: Chest pain.  Short of breath.

CT ANGIOGRAPHY CHEST
TECHNIQUE: Multidetector CT imaging of the chest using the
standard protocol during bolus administration of intravenous
contrast. Multiplanar reconstructed images including MIPs were
obtained and reviewed to evaluate the vascular anatomy.
Contrast: 100mL OMNIPAQUE IOHEXOL 350 MG/ML IV SOLN

[Series 6: thins (id) / (id) · axial · 0.67mm/px · z∈[-367,-116]mm · 18 of 279 slices shown]
[im 14/279  lung]
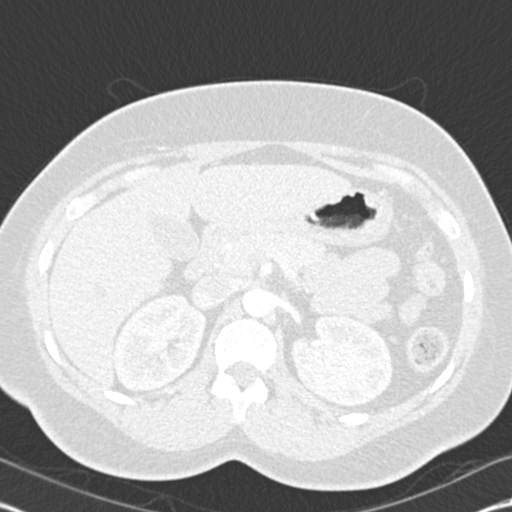
[im 28/279  mediastinal]
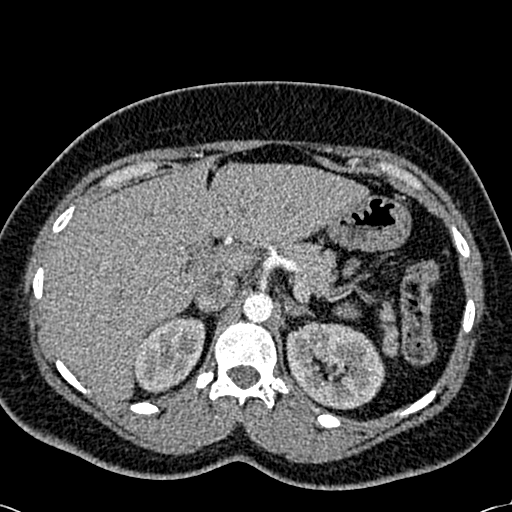
[im 42/279  lung]
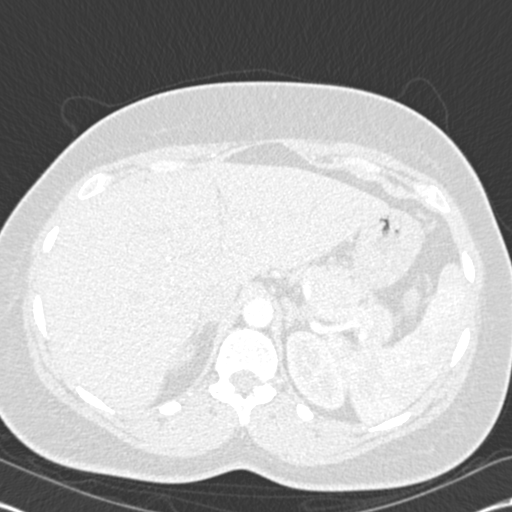
[im 56/279  mediastinal]
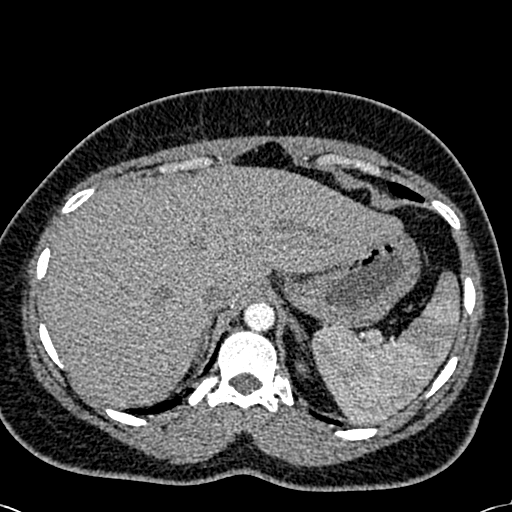
[im 70/279  lung]
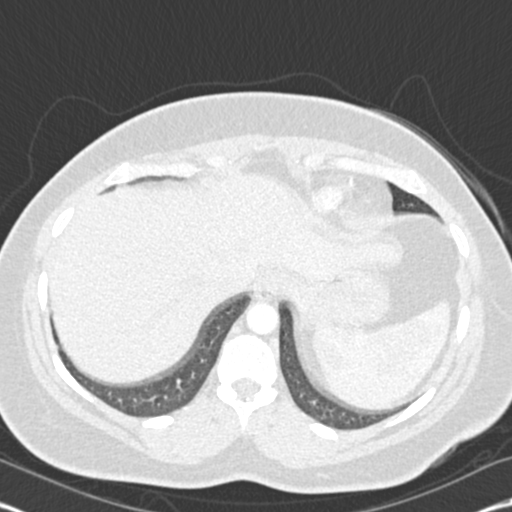
[im 84/279  mediastinal]
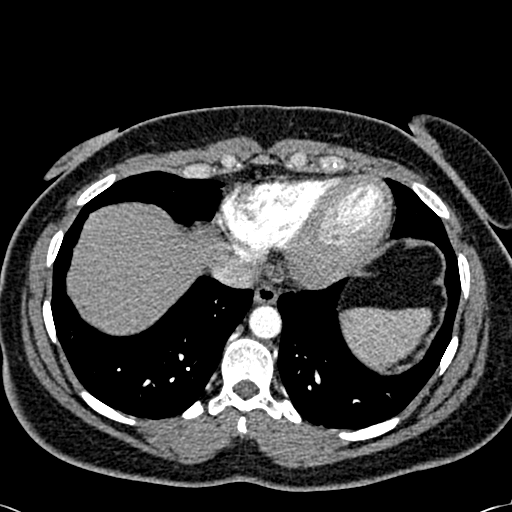
[im 98/279  lung]
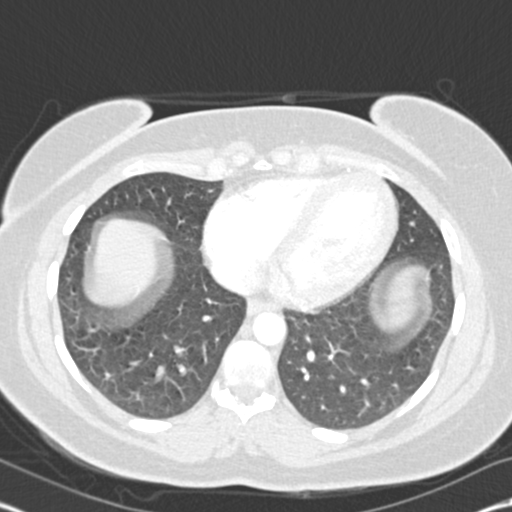
[im 112/279  mediastinal]
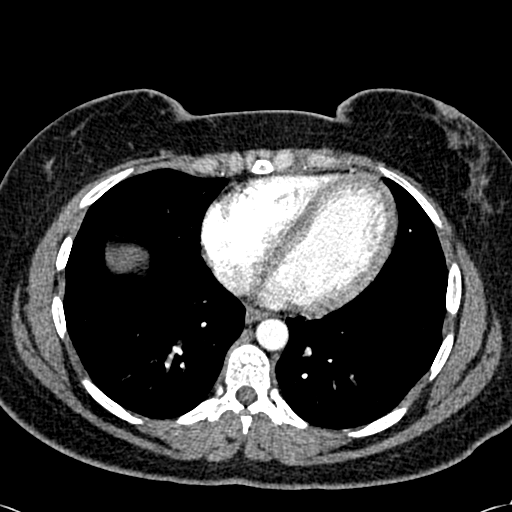
[im 126/279  lung]
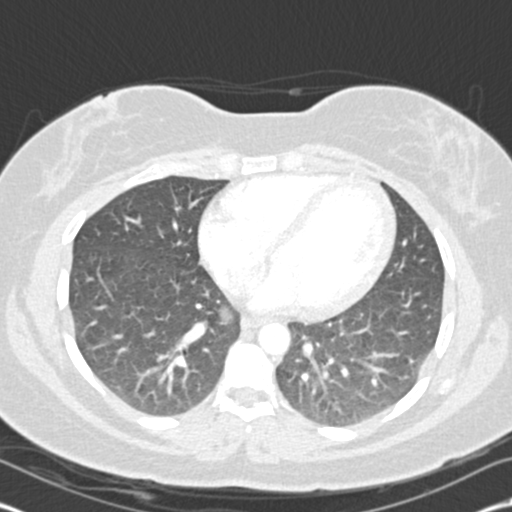
[im 153/279  mediastinal]
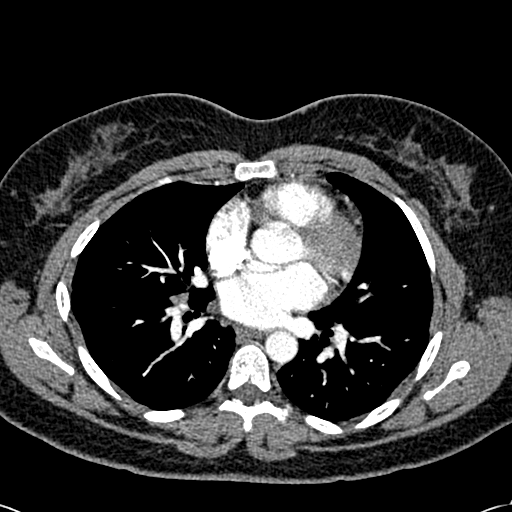
[im 167/279  lung]
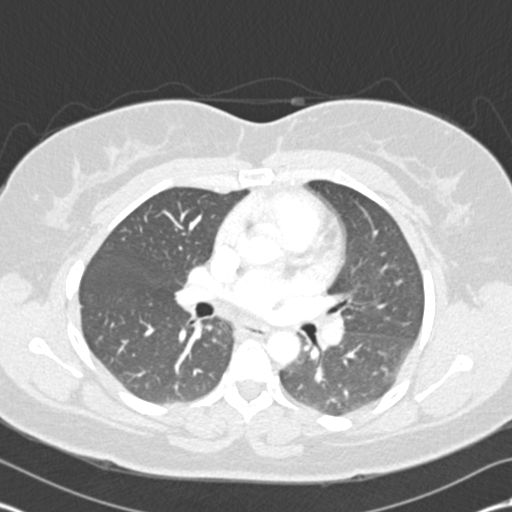
[im 181/279  mediastinal]
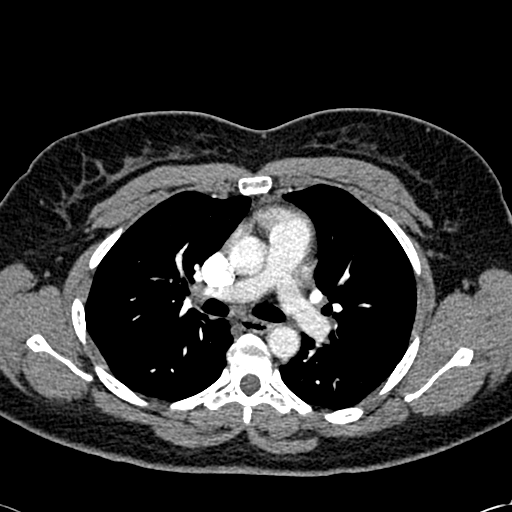
[im 195/279  lung]
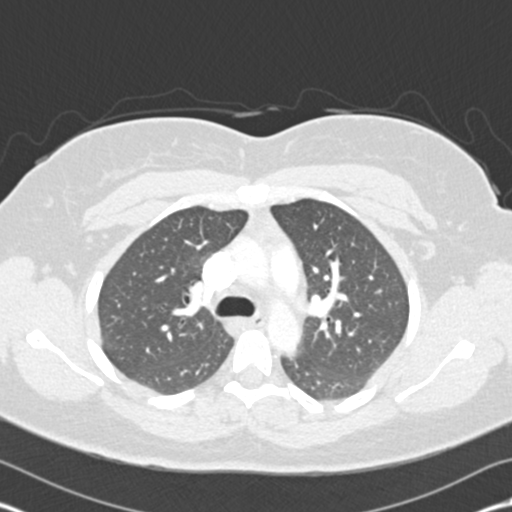
[im 209/279  mediastinal]
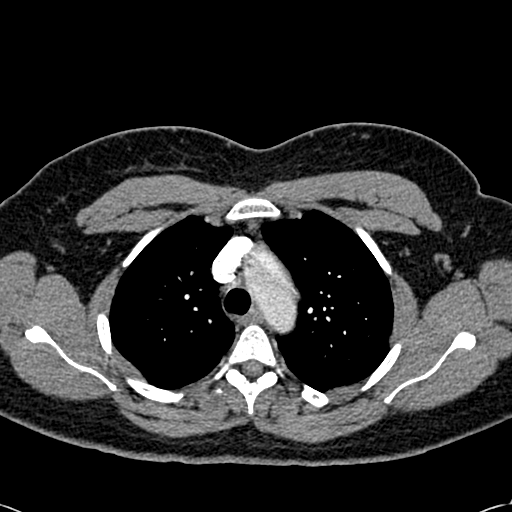
[im 223/279  lung]
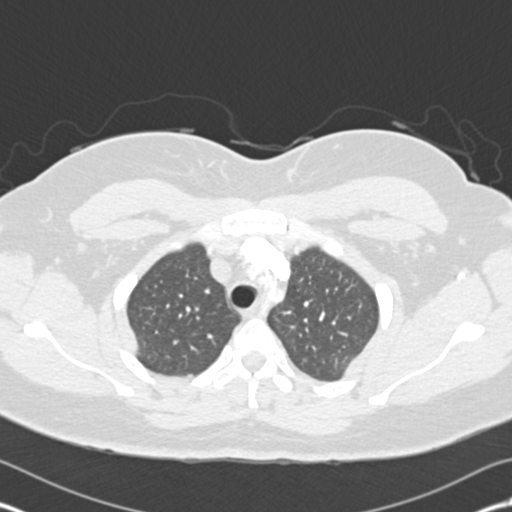
[im 237/279  mediastinal]
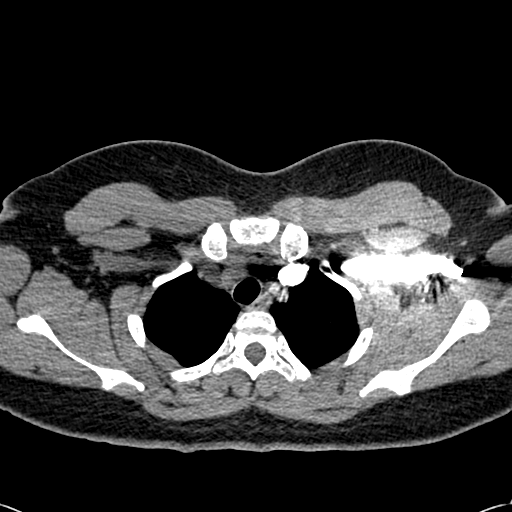
[im 251/279  lung]
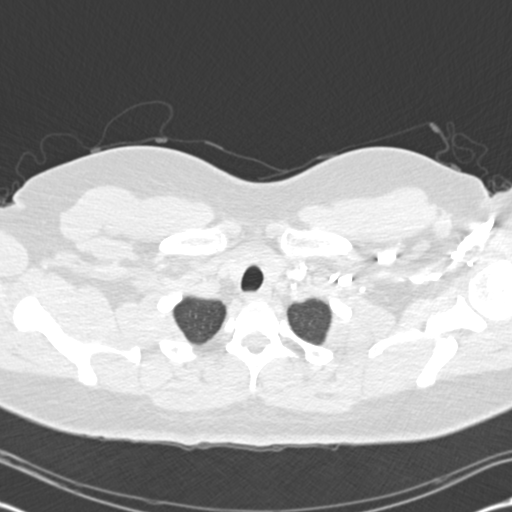
[im 265/279  mediastinal]
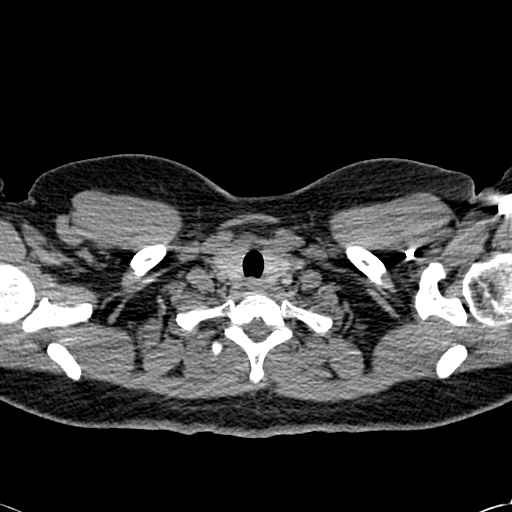

[Series 602: cor · coronal · 0.67mm/px · 1 of 119 slices shown]
[im 60/119  mediastinal]
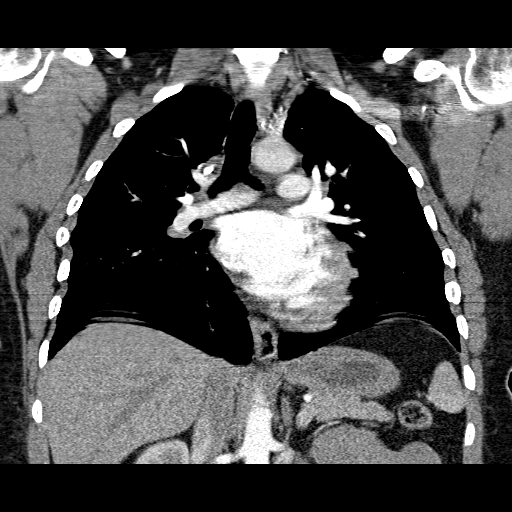

[19 of 36 positions shown; findings below may reference images not displayed]

FINDINGS: Technically adequate study without pulmonary embolus.  No
adenopathy.  No pleural effusion.  Heart grossly appears normal.
Incidental imaging the upper abdomen is normal.  Normal aorta.
Lungs demonstrate mild dependent atelectasis.  Respiratory motion
is present at the lung bases.
IMPRESSION: Technically adequate study.  Negative for pulmonary embolus.  No
acute abnormality.

## 2011-09-28 MED ORDER — TRAMADOL HCL 50 MG PO TABS: 50.0000 mg | ORAL_TABLET | Freq: Three times a day (TID) | ORAL | Status: AC | PRN

## 2011-09-28 MED ORDER — IOHEXOL 350 MG/ML SOLN
100.0000 mL | Freq: Once | INTRAVENOUS | Status: AC | PRN
  Administered 2011-09-28: 100 mL via INTRAVENOUS

## 2011-09-28 MED ORDER — CYCLOBENZAPRINE HCL 10 MG PO TABS: 10.0000 mg | ORAL_TABLET | Freq: Three times a day (TID) | ORAL | Status: AC | PRN

## 2011-09-28 NOTE — Progress Notes (Signed)
  Subjective:    Patient ID: Angela Hahn, female    DOB: 12/14/1975, 36 y.o.   MRN: [id number]  HPI 36 year old African American female, patient of Dr. Clent Ridges is in today with complaints of left upper back pain that occurred suddenly this morning she woke up from her sleep. She rates the pain a 10 out of 10. The pain is worse when she takes a deep breath and with sudden movement. She reports the pain being better when she standing and worse with sitting down.  The pain is sharp. The pain makes her feel short of breath. Patient denies any lightheadedness, dizziness, denies nausea, vomiting or edema. She is on oral contraceptive pills. nonsmoker.   Patient also has a history of GERD has been out of her medication. She denies any feelings of fullness, bloating, no increase in belching or burping, diarrhea or constipation.   Review of Systems  Constitutional: Negative.   HENT: Negative.   Eyes: Negative.   Respiratory: Positive for shortness of breath. Negative for cough, chest tightness, wheezing and stridor.   Cardiovascular: Positive for chest pain.       Worse with deep breathing.  Gastrointestinal: Negative.   Musculoskeletal: Positive for back pain.       Pain in her upper back  Skin: Negative.   Neurological: Negative.   Psychiatric/Behavioral: Negative.        Objective:   Physical Exam  Constitutional: She is oriented to person, place, and time. She appears well-developed and well-nourished.  HENT:  Right Ear: External ear normal.  Left Ear: External ear normal.  Mouth/Throat: Oropharynx is clear and moist.  Neck: Normal range of motion. Neck supple.  Cardiovascular: Normal rate, regular rhythm and normal heart sounds.   Pulmonary/Chest: Effort normal and breath sounds normal. No respiratory distress. She has no wheezes. She exhibits no tenderness.       Pain is not reproducible to palpation. Pain is not reproducible with flexion and extension of the chest/torso. Pain worse with  taking a deep breath.  Abdominal: Soft. Bowel sounds are normal.  Neurological: She is alert and oriented to person, place, and time.  Skin: Skin is warm and dry.  Psychiatric: She has a normal mood and affect.      EKG: Within normal limits    Assessment & Plan:  Assessment: Chest pain with breathing, rule out pulmonary embolism   Plan: Due to the patient's history of being on oral contraceptive pills and being 36 years of age I think it's important to rule out a pulmonary embolism. CTPA was ordered. Labs and to include BMP, d-dimer, CBC and ESR. I don't patient pending results. Patient was sent directly [facility].

## 2011-09-28 NOTE — Telephone Encounter (Signed)
Rx sent 

## 2011-09-29 ENCOUNTER — Other Ambulatory Visit: Payer: Self-pay

## 2011-09-29 LAB — SEDIMENTATION RATE: Sed Rate: 26 mm/hr — ABNORMAL HIGH (ref 0–22)

## 2011-11-23 ENCOUNTER — Encounter: Payer: Self-pay | Admitting: Family Medicine

## 2012-01-02 ENCOUNTER — Other Ambulatory Visit: Payer: Self-pay

## 2012-01-08 ENCOUNTER — Encounter: Payer: Self-pay | Admitting: Family Medicine

## 2012-01-08 DIAGNOSIS — Z0289 Encounter for other administrative examinations: Secondary | ICD-10-CM

## 2012-06-17 ENCOUNTER — Other Ambulatory Visit: Payer: Self-pay | Admitting: Family Medicine

## 2012-06-18 ENCOUNTER — Ambulatory Visit (INDEPENDENT_AMBULATORY_CARE_PROVIDER_SITE_OTHER): Payer: BC Managed Care – PPO | Admitting: Family Medicine

## 2012-06-18 ENCOUNTER — Encounter: Payer: Self-pay | Admitting: Family Medicine

## 2012-06-18 VITALS — BP 130/74 | HR 110 | Temp 98.6°F | Wt 228.0 lb

## 2012-06-18 DIAGNOSIS — M545 Other chronic pain: Secondary | ICD-10-CM | POA: Insufficient documentation

## 2012-06-18 DIAGNOSIS — G8929 Other chronic pain: Secondary | ICD-10-CM | POA: Insufficient documentation

## 2012-06-18 MED ORDER — MELOXICAM 15 MG PO TABS: 15.0000 mg | ORAL_TABLET | Freq: Every day | ORAL | Status: DC
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
## 2012-10-01 ENCOUNTER — Emergency Department (HOSPITAL_COMMUNITY)
Admission: EM | Admit: 2012-10-01 | Discharge: 2012-10-01 | Disposition: A | Discharge status: Home / Self Care | Payer: Self-pay | Attending: Emergency Medicine | Admitting: Emergency Medicine

## 2012-10-01 ENCOUNTER — Encounter (HOSPITAL_COMMUNITY): Payer: Self-pay | Admitting: Emergency Medicine

## 2012-10-01 DIAGNOSIS — Z8679 Personal history of other diseases of the circulatory system: Secondary | ICD-10-CM | POA: Insufficient documentation

## 2012-10-01 DIAGNOSIS — R112 Nausea with vomiting, unspecified: Secondary | ICD-10-CM | POA: Insufficient documentation

## 2012-10-01 DIAGNOSIS — Z862 Personal history of diseases of the blood and blood-forming organs and certain disorders involving the immune mechanism: Secondary | ICD-10-CM | POA: Insufficient documentation

## 2012-10-01 DIAGNOSIS — R011 Cardiac murmur, unspecified: Secondary | ICD-10-CM | POA: Insufficient documentation

## 2012-10-01 DIAGNOSIS — K047 Periapical abscess without sinus: Secondary | ICD-10-CM | POA: Insufficient documentation

## 2012-10-01 MED ORDER — CLINDAMYCIN HCL 150 MG PO CAPS: 150.0000 mg | ORAL_CAPSULE | Freq: Four times a day (QID) | ORAL | Status: DC

## 2012-10-01 MED ORDER — IBUPROFEN 600 MG PO TABS: 600.0000 mg | ORAL_TABLET | Freq: Four times a day (QID) | ORAL | Status: DC | PRN

## 2012-10-01 MED ORDER — PENICILLIN G BENZATHINE 1200000 UNIT/2ML IM SUSP
1.2000 10*6.[IU] | Freq: Once | INTRAMUSCULAR | Status: AC
  Administered 2012-10-01: 1.2 10*6.[IU] via INTRAMUSCULAR
  Filled 2012-10-01: qty 2

## 2012-10-01 NOTE — ED Provider Notes (Signed)
History     CSN: [id number]  Arrival date & time 10/01/12  1602   First MD Initiated Contact with Patient 10/01/12 1657      Chief Complaint  Patient presents with  . Vomiting  . Dental Pain    (Consider location/radiation/quality/duration/timing/severity/associated sxs/prior treatment) Patient is a 37 y.o. female presenting with tooth pain. The history is provided by the patient.  Dental Pain  patient here with left upper gum swelling and drainage that started 5 days ago. Patient noted facial swelling at that time and felt a soft fluctuant area in side of her upper gumline and did express pus from it. Her facial swelling greatly improved but she still has drainage at this time. When she takes the drainage she becomes nauseated. No actual fever. No facial swelling or erythema. Does have an appointment to see a dentist later this week  Past Medical History  Diagnosis Date  . Heart murmur   . Chronic headaches   . Anemia     Past Surgical History  Procedure Date  . Tubal ligation     Family History  Problem Relation Age of Onset  . Arthritis Mother   . Depression Mother   . Hypertension Father     History  Substance Use Topics  . Smoking status: Never Smoker   . Smokeless tobacco: Never Used  . Alcohol Use: No    OB History    Grav Para Term Preterm Abortions TAB SAB Ect Mult Living                  Review of Systems  All other systems reviewed and are negative.    Allergies  Hydrocodone  Home Medications   Current Outpatient Rx  Name  Route  Sig  Dispense  Refill  . CYCLOBENZAPRINE HCL 10 MG PO TABS   Oral   Take 10 mg by mouth 3 (three) times daily as needed.         . MELOXICAM 15 MG PO TABS   Oral   Take 1 tablet (15 mg total) by mouth daily.   30 tablet   11   . NORGESTIM-ETH ESTRAD TRIPHASIC 0.18/0.215/0.25 MG-35 MCG PO TABS   Oral   Take 1 tablet by mouth daily.   1 Package   11   . TRAMADOL HCL 50 MG PO TABS   Oral   Take 50 mg  by mouth every 6 (six) hours as needed.         . TRANSDERM-SCOP 1.5 MG TD PT72      PLACE 1 PATCH ONTO THE SKIN EVERY 3 DAYS.   5 patch   1     BP 133/79  Pulse 111  Temp 98.5 F (36.9 C) (Oral)  Resp 18  SpO2 100%  Physical Exam  Nursing note and vitals reviewed. Constitutional: She is oriented to person, place, and time. She appears well-developed and well-nourished.  Non-toxic appearance. No distress.  HENT:  Head: Normocephalic and atraumatic.       Poor dentition noted. Left upper incisor tooth fracture noted. Periodic drainage appreciated above the tooth with a small abscess.  Eyes: Conjunctivae normal, EOM and lids are normal. Pupils are equal, round, and reactive to light.  Neck: Normal range of motion. Neck supple. No tracheal deviation present. No mass present.  Cardiovascular: Regular rhythm and normal heart sounds.  Tachycardia present.  Exam reveals no gallop.   No murmur heard. Pulmonary/Chest: Effort normal and breath sounds normal. No stridor.  No respiratory distress. She has no decreased breath sounds. She has no wheezes. She has no rhonchi. She has no rales.  Abdominal: Soft. Normal appearance and bowel sounds are normal. She exhibits no distension. There is no tenderness. There is no rebound and no CVA tenderness.  Musculoskeletal: Normal range of motion. She exhibits no edema and no tenderness.  Neurological: She is alert and oriented to person, place, and time. She has normal strength. No cranial nerve deficit or sensory deficit. GCS eye subscore is 4. GCS verbal subscore is 5. GCS motor subscore is 6.  Skin: Skin is warm and dry. No abrasion and no rash noted.  Psychiatric: She has a normal mood and affect. Her speech is normal and behavior is normal.    ED Course  Procedures (including critical care time)  Labs Reviewed - No data to display No results found.   No diagnosis found.    MDM  Patient given Bicillin here and she will followup with  her dentist        Toy Baker, MD 10/01/12 [id number]

## 2012-10-01 NOTE — ED Notes (Signed)
Pt wishes to have blood drawn with IV start

## 2012-10-01 NOTE — ED Notes (Signed)
Pt sts vomiting x 2 over last 4 days and having right upper dental pain with purulent drainage

## 2013-01-26 ENCOUNTER — Encounter (HOSPITAL_COMMUNITY): Payer: Self-pay | Admitting: Emergency Medicine

## 2013-01-26 ENCOUNTER — Emergency Department (HOSPITAL_COMMUNITY)
Admission: EM | Admit: 2013-01-26 | Discharge: 2013-01-26 | Disposition: A | Discharge status: Home / Self Care | Payer: Self-pay | Attending: Emergency Medicine | Admitting: Emergency Medicine

## 2013-01-26 DIAGNOSIS — Z79899 Other long term (current) drug therapy: Secondary | ICD-10-CM | POA: Insufficient documentation

## 2013-01-26 DIAGNOSIS — Z8679 Personal history of other diseases of the circulatory system: Secondary | ICD-10-CM | POA: Insufficient documentation

## 2013-01-26 DIAGNOSIS — J029 Acute pharyngitis, unspecified: Secondary | ICD-10-CM | POA: Insufficient documentation

## 2013-01-26 DIAGNOSIS — J3489 Other specified disorders of nose and nasal sinuses: Secondary | ICD-10-CM | POA: Insufficient documentation

## 2013-01-26 DIAGNOSIS — Z862 Personal history of diseases of the blood and blood-forming organs and certain disorders involving the immune mechanism: Secondary | ICD-10-CM | POA: Insufficient documentation

## 2013-01-26 LAB — RAPID STREP SCREEN (MED CTR MEBANE ONLY): Streptococcus, Group A Screen (Direct): NEGATIVE

## 2013-01-26 MED ORDER — IBUPROFEN 800 MG PO TABS: 800.0000 mg | ORAL_TABLET | Freq: Three times a day (TID) | ORAL | Status: DC

## 2013-01-26 NOTE — ED Notes (Signed)
Pt. Stated, I started having burning in my throat and today its really sore.

## 2013-01-26 NOTE — ED Provider Notes (Signed)
History    This chart was scribed for non-physician practitioner, Lottie Mussel, PA-C, working with Geoffery Lyons, MD by Melba Coon, ED Scribe. This patient was seen in room TR04C/TR04C and the patient's care was started at 5:11PM.   CSN: [id number]  Arrival date & time 01/26/13  1451   None     Chief Complaint  Patient presents with  . Sore Throat    (Consider location/radiation/quality/duration/timing/severity/associated sxs/prior treatment) Patient is a 37 y.o. female presenting with pharyngitis. The history is provided by the patient. No language interpreter was used.  Sore Throat Pertinent negatives include no chest pain, no headaches and no shortness of breath.   HPI Comments: Angela Hahn is a 37 y.o. female who presents to the Emergency Department complaining of a constant, moderate to severe burning sore throat with a gradual onset yesterday. She reports she thought it was acid reflux at the time of onset. However the pain has gotten progressively worse today.She has not tried any treatments at home. Swallowing aggravates the pain. No other pertinent medical symptoms.  Past Medical History  Diagnosis Date  . Heart murmur   . Chronic headaches   . Anemia     Past Surgical History  Procedure Laterality Date  . Tubal ligation      Family History  Problem Relation Age of Onset  . Arthritis Mother   . Depression Mother   . Hypertension Father     History  Substance Use Topics  . Smoking status: Never Smoker   . Smokeless tobacco: Never Used  . Alcohol Use: No    OB History   Grav Para Term Preterm Abortions TAB SAB Ect Mult Living                  Review of Systems  Constitutional: Negative for fever and diaphoresis.  HENT: Positive for sore throat and rhinorrhea. Negative for ear pain, congestion, neck pain and neck stiffness.   Eyes: Negative for visual disturbance.  Respiratory: Negative for apnea, chest tightness and shortness of breath.    Cardiovascular: Negative for chest pain and palpitations.  Gastrointestinal: Negative for nausea, vomiting, diarrhea and constipation.  Genitourinary: Negative for dysuria.  Musculoskeletal: Negative for gait problem.  Skin: Negative for rash.  Neurological: Negative for dizziness, weakness, light-headedness, numbness and headaches.  All other systems reviewed and are negative.    Allergies  Hydrocodone  Home Medications   Current Outpatient Rx  Name  Route  Sig  Dispense  Refill  . acetaminophen (TYLENOL) 500 MG tablet   Oral   Take 500 mg by mouth every 6 (six) hours as needed for pain.         . meloxicam (MOBIC) 15 MG tablet   Oral   Take 15 mg by mouth daily.         Marland Kitchen omeprazole (PRILOSEC) 20 MG capsule   Oral   Take 20 mg by mouth daily as needed (for acid reflux).           BP 121/74  Pulse 98  Temp(Src) 97.8 F (36.6 C)  Resp 20  SpO2 100%  LMP 01/15/2013  Physical Exam  Nursing note and vitals reviewed. Constitutional: She is oriented to person, place, and time. She appears well-developed and well-nourished.  HENT:  Head: Normocephalic.  Right Ear: Tympanic membrane and external ear normal.  Left Ear: Tympanic membrane and external ear normal.  Nose: Nose normal.  Mouth/Throat: Uvula is midline and oropharynx is clear and moist.  No oropharyngeal exudate.  Bilaterally enlagred erythematous tonsils.  Eyes: EOM are normal. Pupils are equal, round, and reactive to light. Right eye exhibits no discharge. Left eye exhibits no discharge.  Neck: Normal range of motion. Neck supple. No tracheal deviation present.  No nuchal rigidity no meningeal signs  Cardiovascular: Normal rate, regular rhythm and normal heart sounds.   No murmur heard. Pulmonary/Chest: Effort normal and breath sounds normal. No stridor. No respiratory distress. She has no wheezes. She has no rales.  Abdominal: Soft. She exhibits no distension and no mass. There is no tenderness.  There is no rebound and no guarding.  Musculoskeletal: Normal range of motion. She exhibits no edema and no tenderness.  Lymphadenopathy:    She has no cervical adenopathy.  Neurological: She is alert and oriented to person, place, and time. She has normal reflexes. No cranial nerve deficit. Coordination normal.  Skin: Skin is warm. No rash noted. She is not diaphoretic. No erythema. No pallor.  No pettechia no purpura    ED Course  Procedures (including critical care time)  COORDINATION OF CARE:  5:14PM - rapid strep test results are negative. Group A strep culture will be sent off. She is advised to not share drinks or saliva at home. Will be contacted if her culture is positive. Advised to f/u with PCP or ED if her symptoms worsen. She is ready for d/c.   Labs Reviewed  RAPID STREP SCREEN  CULTURE, GROUP A STREP   No results found.   1. Pharyngitis       MDM  Pt with sore throat onset yesterday. Mild nasal congestion, mild cough. Strep negative. No signs of peritonsillar abscess. Pt afebrile, non toxic. Suspect most likely viral pharyngitis. Home with symptomatic treatment. Follow up as needed.   Filed Vitals:   01/26/13 1505  BP: 121/74  Pulse: 98  Temp: 97.8 F (36.6 C)  Resp: 20  SpO2: 100%   Filed Vitals:   01/26/13 1505  BP: 121/74  Pulse: 98  Temp: 97.8 F (36.6 C)  Resp: 20  SpO2: 100%        Angela Jacobson Jemeka Wagler, PA-C 01/27/13 0035

## 2013-01-27 NOTE — ED Provider Notes (Signed)
Medical screening examination/treatment/procedure(s) were performed by non-physician practitioner and as supervising physician I was immediately available for consultation/collaboration.  Geoffery Lyons, MD 01/27/13 [phone]

## 2013-01-28 LAB — CULTURE, GROUP A STREP

## 2013-02-03 ENCOUNTER — Ambulatory Visit: Payer: Self-pay | Admitting: Family Medicine

## 2013-02-04 ENCOUNTER — Ambulatory Visit (INDEPENDENT_AMBULATORY_CARE_PROVIDER_SITE_OTHER): Payer: Worker's Compensation | Admitting: Family Medicine

## 2013-02-04 ENCOUNTER — Encounter: Payer: Self-pay | Admitting: Family Medicine

## 2013-02-04 VITALS — BP 128/76 | HR 98 | Temp 98.3°F | Wt 234.0 lb

## 2013-02-04 DIAGNOSIS — IMO0001 Reserved for inherently not codable concepts without codable children: Secondary | ICD-10-CM

## 2013-02-04 DIAGNOSIS — M545 Low back pain: Secondary | ICD-10-CM

## 2013-02-04 NOTE — Progress Notes (Signed)
  Subjective:    Patient ID: Angela Hahn, female    DOB: Jan 19, 1976, 37 y.o.   MRN: [id number]  HPI Here to fill out a form and to discuss leg pains. Of course she has chronic low back pain and has been rated as 5% disabled. Now she is also having burning pains in the lower legs and feet. She is dieting but takes a multivitamin daily. She had taken out an educational loan and has an Brewing technologist on it so that she can be excused from repayment deadlines if she is injured and cannot work. She has a form to that effect.    Review of Systems  Constitutional: Negative.   Musculoskeletal: Positive for myalgias and back pain.       Objective:   Physical Exam  Constitutional: She is oriented to person, place, and time. She appears well-developed and well-nourished.  Cardiovascular: Intact distal pulses.   Musculoskeletal: Normal range of motion. She exhibits no edema and no tenderness.  Neurological: She is alert and oriented to person, place, and time. She has normal reflexes. No cranial nerve deficit. She exhibits normal muscle tone. Coordination normal.          Assessment & Plan:  Using Meloxicam for her back pain. I filled out the loan forgiveness form. Get labs today to investigate the burning pains in her feet.

## 2013-02-05 LAB — CBC WITH DIFFERENTIAL/PLATELET
Basophils Relative: 1.3 % (ref 0.0–3.0)
Eosinophils Relative: 2.8 % (ref 0.0–5.0)
Hemoglobin: 12.2 g/dL (ref 12.0–15.0)
Lymphocytes Relative: 38.4 % (ref 12.0–46.0)
Monocytes Relative: 7.4 % (ref 3.0–12.0)
Neutrophils Relative %: 50.1 % (ref 43.0–77.0)
RBC: 4.56 Mil/uL (ref 3.87–5.11)

## 2013-02-05 LAB — BASIC METABOLIC PANEL
BUN: 11 mg/dL (ref 6–23)
CO2: 25 mEq/L (ref 19–32)
Chloride: 106 mEq/L (ref 96–112)
Creatinine, Ser: 0.7 mg/dL (ref 0.4–1.2)
Glucose, Bld: 92 mg/dL (ref 70–99)
Potassium: 3.8 mEq/L (ref 3.5–5.1)

## 2013-02-05 LAB — VITAMIN B12: Vitamin B-12: 799 pg/mL (ref 211–911)

## 2013-02-05 LAB — VITAMIN D 25 HYDROXY (VIT D DEFICIENCY, FRACTURES): Vit D, 25-Hydroxy: 40 ng/mL (ref 30–89)

## 2013-02-07 ENCOUNTER — Telehealth: Payer: Self-pay | Admitting: Family Medicine

## 2013-02-07 NOTE — Telephone Encounter (Signed)
Pt called in and I did go over the lab results with her.

## 2013-02-07 NOTE — Progress Notes (Signed)
Quick Note:  I tried to reach pt by phone, no answer or option to leave a message. ______

## 2013-03-07 ENCOUNTER — Telehealth: Payer: Self-pay | Admitting: Family Medicine

## 2013-03-07 NOTE — Telephone Encounter (Signed)
Patient Information:  Caller Name: [place]  Phone: [phone]  Patient: Angela Hahn, Angela Hahn  Gender: Female  DOB: 1975-11-20  Age: 37 Years  PCP: [facility])  Pregnant: No  Office Follow Up:  Does the office need to follow up with this patient?: No  Instructions For The Office: N/A  RN Note:  She will call back later today to get appointment scheduled. She is busy at doctor's office with son. She may go to ER tonight if unable to call back. She is waiting for insurance to start next month to schedule dentist appointment  Symptoms  Reason For Call & Symptoms: Calling about swelling on gum on upper top L side- onset 03/04/13. She was checked in ER for tooth pain 4 months ago and given antibiotic. Afebrile today but taking pain reliever every 6 hours. She pressed on gum and felt large amount drainage at 1215 -03/07/13. Area feels better but thinks need antibiotic.  Reviewed Health History In EMR: Yes  Reviewed Medications In EMR: Yes  Reviewed Allergies In EMR: Yes  Reviewed Surgeries / Procedures: Yes  Date of Onset of Symptoms: 03/04/2013  Treatments Tried: Mobic 1 PO QD, Tylenol ES 2 tab PO BID and Ibupurofen 600mg s PO BID  Treatments Tried Worked: No OB / GYN:  LMP: 02/28/2013  Guideline(s) Used:  Toothache  Disposition Per Guideline:   Go to Office Now  Reason For Disposition Reached:   Face is swollen  Advice Given:  Pain Medicines:  For pain relief, you can take either acetaminophen, ibuprofen, or naproxen.  Acetaminophen (e.g., Tylenol):  Regular Strength Tylenol: Take 650 mg (two 325 mg pills) by mouth every 4-6 hours as needed. Each Regular Strength Tylenol pill has 325 mg of acetaminophen.  Extra Strength Tylenol: Take 1,000 mg (two 500 mg pills) every 8 hours as needed. Each Extra Strength Tylenol pill has 500 mg of acetaminophen.  The most you should take each day is 3,000 mg (10 Regular Strength or 6 Extra Strength pills a day).  Ibuprofen (e.g.,  Motrin, Advil):  Take 400 mg (two 200 mg pills) by mouth every 6 hours.  Another choice is to take 600 mg (three 200 mg pills) by mouth every 8 hours.  The most you should take each day is 1,200 mg (six 200 mg pills), unless your doctor has told you to take more.  Call Your Dentist If:  Toothache lasts longer than 24 hours  The toothache becomes worse  Apply Cold to the Area:   Apply an ice pack to the painful jaw for 20 minutes.  Patient Refused Recommendation:  Patient Will Follow Up With Office Later  Unable to schedule appointment at this time d/t busy schedule. She will call back later or go to ER.

## 2014-02-13 ENCOUNTER — Emergency Department (HOSPITAL_COMMUNITY)
Admission: EM | Admit: 2014-02-13 | Discharge: 2014-02-13 | Disposition: A | Discharge status: Home / Self Care | Payer: BC Managed Care – PPO | Attending: Emergency Medicine | Admitting: Emergency Medicine

## 2014-02-13 ENCOUNTER — Encounter (HOSPITAL_COMMUNITY): Payer: Self-pay | Admitting: Emergency Medicine

## 2014-02-13 DIAGNOSIS — K089 Disorder of teeth and supporting structures, unspecified: Secondary | ICD-10-CM | POA: Insufficient documentation

## 2014-02-13 DIAGNOSIS — Z862 Personal history of diseases of the blood and blood-forming organs and certain disorders involving the immune mechanism: Secondary | ICD-10-CM | POA: Insufficient documentation

## 2014-02-13 DIAGNOSIS — G8929 Other chronic pain: Secondary | ICD-10-CM | POA: Insufficient documentation

## 2014-02-13 DIAGNOSIS — Z791 Long term (current) use of non-steroidal anti-inflammatories (NSAID): Secondary | ICD-10-CM | POA: Insufficient documentation

## 2014-02-13 DIAGNOSIS — R011 Cardiac murmur, unspecified: Secondary | ICD-10-CM | POA: Insufficient documentation

## 2014-02-13 DIAGNOSIS — K0381 Cracked tooth: Secondary | ICD-10-CM | POA: Insufficient documentation

## 2014-02-13 DIAGNOSIS — K0889 Other specified disorders of teeth and supporting structures: Secondary | ICD-10-CM

## 2014-02-13 MED ORDER — PENICILLIN V POTASSIUM 500 MG PO TABS: 500.0000 mg | ORAL_TABLET | Freq: Four times a day (QID) | ORAL | Status: DC

## 2014-02-13 MED ORDER — OXYCODONE-ACETAMINOPHEN 5-325 MG PO TABS: 2.0000 | ORAL_TABLET | ORAL | Status: DC | PRN

## 2014-02-13 NOTE — Discharge Instructions (Signed)
Follow up with your dentist. Take Percocet as needed for pain. Take Veetid as directed until gone. Refer to attached documents for more information.

## 2014-02-13 NOTE — ED Provider Notes (Signed)
CSN: [id number]     Arrival date & time 02/13/14  2023 History  This chart was scribed for Alvina Chou, PA-C working with Alfonzo Feller, DO by Randa Evens, ED Scribe. This patient was seen in room TR09C/TR09C and the patient's care was started at 9:25 PM.    Chief Complaint  Patient presents with  . Dental Pain   The history is provided by the patient. No language interpreter was used.   HPI Comments: Angela Hahn is a 38 y.o. female who presents to the Emergency Department complaining of constant left upper dental pain onset 2 days prior. She states she has seen some drainage but she is unsure of the color. Shse states she has been taken ibuprofen and tylenol with no relief. She denies having a fever or other related symptoms    Past Medical History  Diagnosis Date  . Heart murmur   . Chronic headaches   . Anemia    Past Surgical History  Procedure Laterality Date  . Tubal ligation     Family History  Problem Relation Age of Onset  . Arthritis Mother   . Depression Mother   . Hypertension Father    History  Substance Use Topics  . Smoking status: Never Smoker   . Smokeless tobacco: Never Used  . Alcohol Use: No   OB History   Grav Para Term Preterm Abortions TAB SAB Ect Mult Living                 Review of Systems  Constitutional: Negative for fever.  HENT: Positive for dental problem.   All other systems reviewed and are negative.    Allergies  Hydrocodone  Home Medications   Prior to Admission medications   Medication Sig Start Date End Date Taking? Authorizing Provider  acetaminophen (TYLENOL) 500 MG tablet Take 1,200 mg by mouth. Arthritis brand   Yes Historical Provider, MD  ibuprofen (ADVIL,MOTRIN) 800 MG tablet Take 1 tablet (800 mg total) by mouth 3 (three) times daily. 01/26/13  Yes Tatyana A Kirichenko, PA-C   Triage Vitals: BP 161/95  Pulse 92  Temp(Src) 98.9 F (37.2 C) (Oral)  Resp 18  SpO2 99%  LMP 01/24/2014  Physical Exam   Nursing note and vitals reviewed. Constitutional: She is oriented to person, place, and time. She appears well-developed and well-nourished. No distress.  HENT:  Head: Normocephalic and atraumatic.  Fair dentition. Left upper molar cracked and tender to percussion. No abscess noted.   Eyes: Conjunctivae and EOM are normal.  Neck: Neck supple. No tracheal deviation present.  Cardiovascular: Normal rate.   Pulmonary/Chest: Effort normal. No respiratory distress.  Musculoskeletal: Normal range of motion.  Neurological: She is alert and oriented to person, place, and time.  Skin: Skin is warm and dry.  Psychiatric: She has a normal mood and affect. Her behavior is normal.    ED Course  NERVE BLOCK Date/Time: 02/13/2014 9:42 PM Performed by: Alvina Chou Authorized by: Alvina Chou Consent: Verbal consent obtained. Risks and benefits: risks, benefits and alternatives were discussed Consent given by: patient Patient understanding: patient states understanding of the procedure being performed Patient consent: the patient's understanding of the procedure matches consent given Time out: Immediately prior to procedure a "time out" was called to verify the correct patient, procedure, equipment, support staff and site/side marked as required. Indications: pain relief Body area: face/mouth Nerve: infraorbital Laterality: left Patient sedated: no Patient position: sitting Needle gauge: 27 G Location technique: anatomical landmarks Local  anesthetic: bupivacaine 0.5% with epinephrine Anesthetic total: 3 ml Outcome: pain improved Patient tolerance: Patient tolerated the procedure well with no immediate complications.   (including critical care time) DIAGNOSTIC STUDIES: Oxygen Saturation is 99% on RA, normal by my interpretation.    COORDINATION OF CARE:    Labs Review Labs Reviewed - No data to display  Imaging Review No results found.   EKG Interpretation None       MDM   Final diagnoses:  Pain, dental    9:41 PM Patient given dental block for pain relief. Patient reports improvement of her pain. Vitals stable and patient afebrile. Patient will be discharged with Percocet and Veetid. Patient has a dentist she will follow up with.   I personally performed the services described in this documentation, which was scribed in my presence. The recorded information has been reviewed and is accurate.      Alvina Chou, [place] 02/13/14 2146

## 2014-02-13 NOTE — ED Notes (Signed)
Pt c/o upper left dental pain x2 days. sts drainage, unsure of color.

## 2014-02-15 NOTE — ED Provider Notes (Signed)
Medical screening examination/treatment/procedure(s) were performed by non-physician practitioner and as supervising physician I was immediately available for consultation/collaboration.   EKG Interpretation None        Alfonzo Feller, DO 02/15/14 1448

## 2014-09-26 ENCOUNTER — Encounter (HOSPITAL_COMMUNITY): Payer: Self-pay | Admitting: *Deleted

## 2014-09-26 ENCOUNTER — Emergency Department (INDEPENDENT_AMBULATORY_CARE_PROVIDER_SITE_OTHER)
Admission: EM | Admit: 2014-09-26 | Discharge: 2014-09-26 | Disposition: A | Discharge status: Home / Self Care | Payer: BLUE CROSS/BLUE SHIELD | Source: Home / Self Care | Attending: Emergency Medicine | Admitting: Emergency Medicine

## 2014-09-26 ENCOUNTER — Emergency Department (INDEPENDENT_AMBULATORY_CARE_PROVIDER_SITE_OTHER): Payer: BLUE CROSS/BLUE SHIELD

## 2014-09-26 DIAGNOSIS — M25562 Pain in left knee: Secondary | ICD-10-CM

## 2014-09-26 DIAGNOSIS — M7652 Patellar tendinitis, left knee: Secondary | ICD-10-CM

## 2014-09-26 IMAGING — DX DG KNEE COMPLETE 4+V*L*
4 series · 4 of 4 positions shown · non-contrast
Comparison: [DATE].

CLINICAL DATA: 38-year-old female with anterior and medial left
knee pain for the past 2 weeks.

EXAM:
LEFT KNEE - COMPLETE 4+ VIEW

[knee ap]
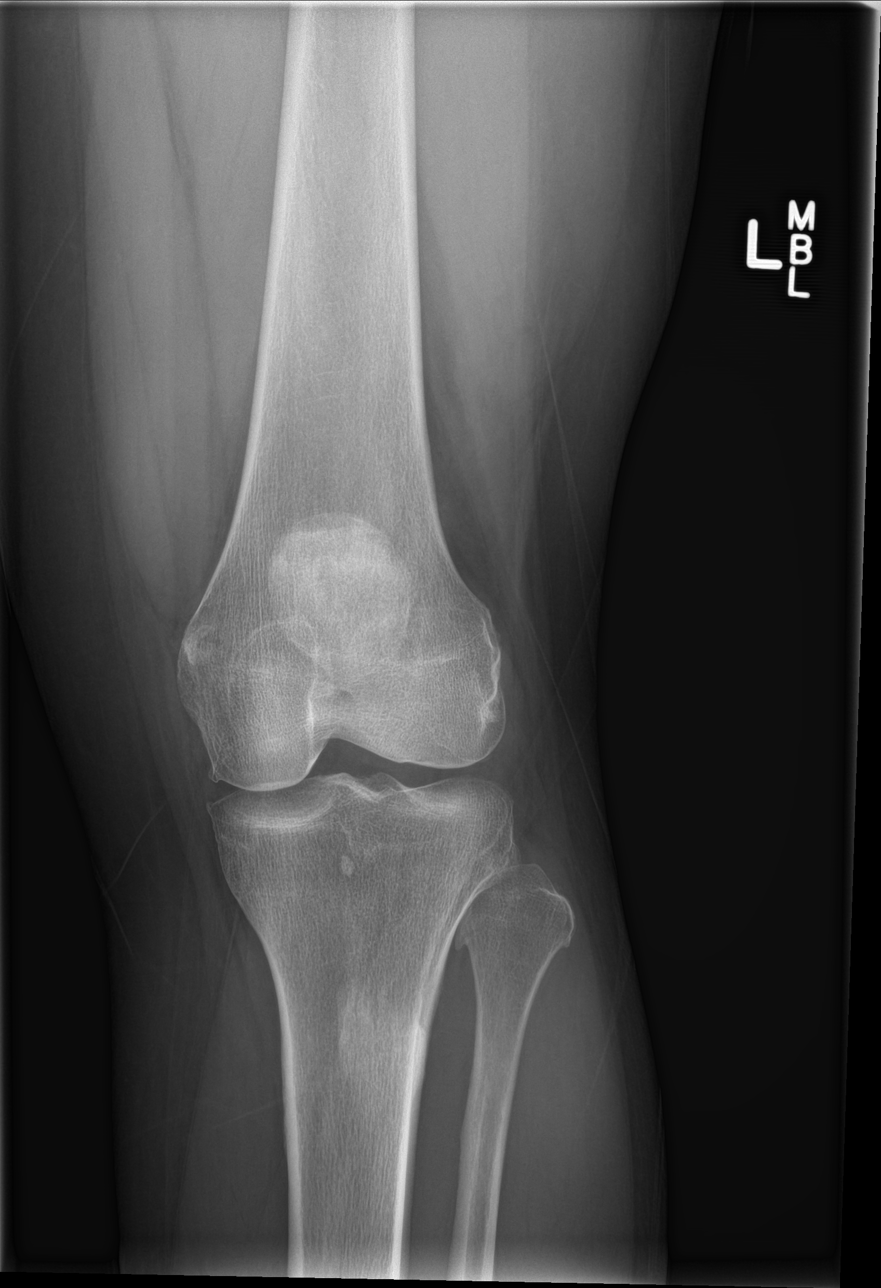

[knee obl (1 of 2)]
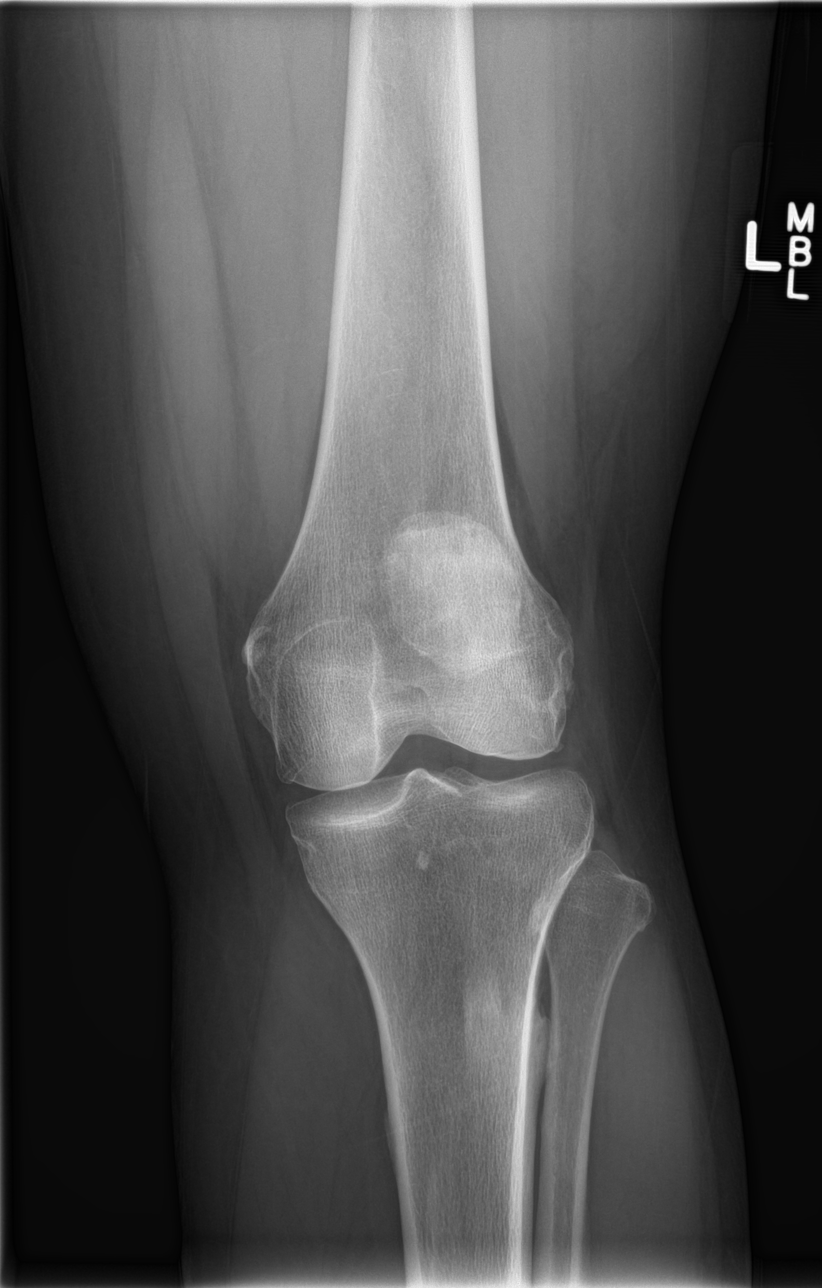

[knee obl (2 of 2)]
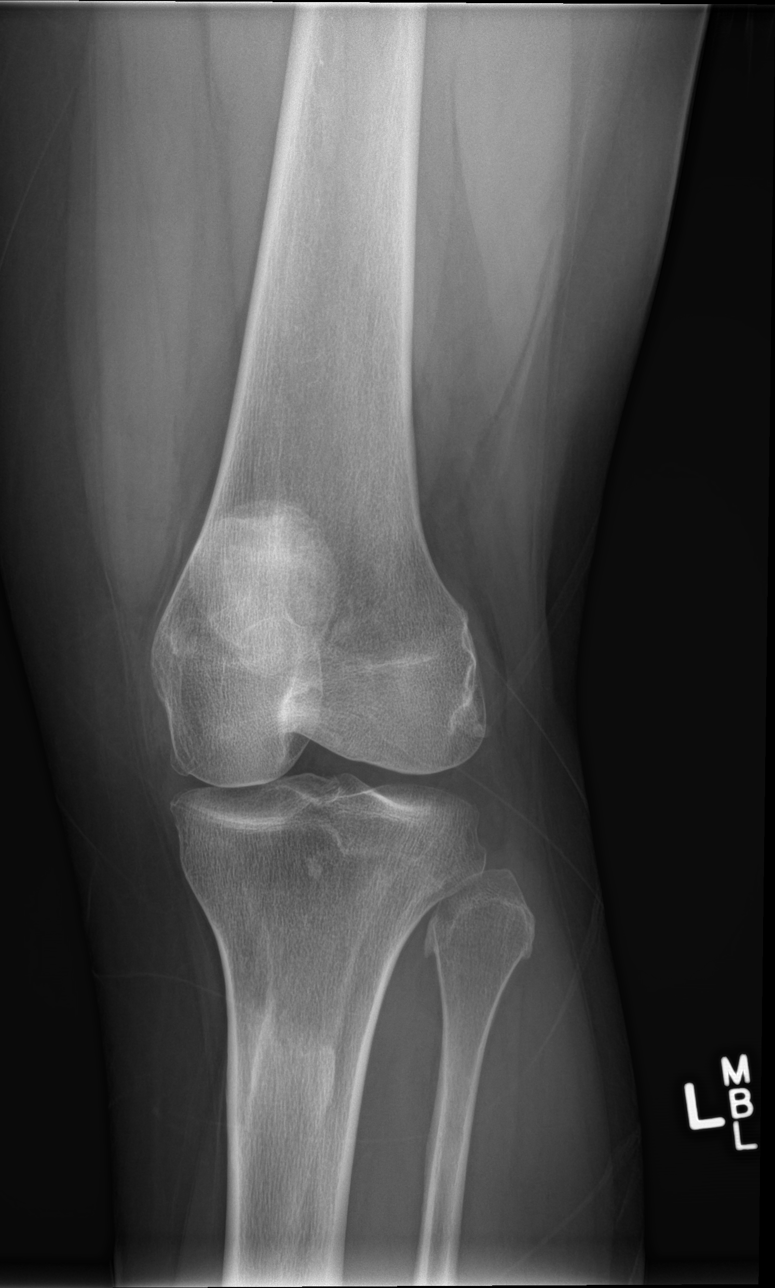

[knee lat]
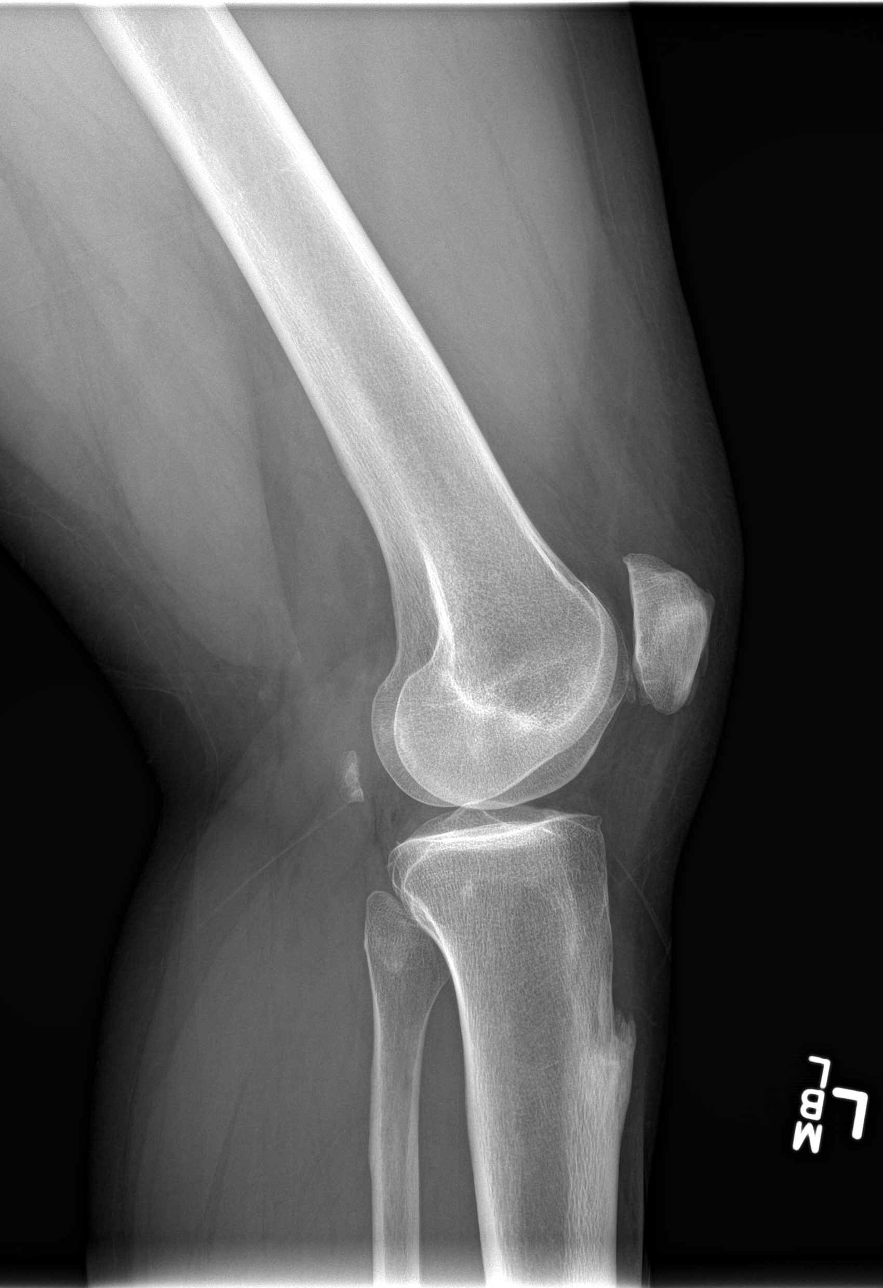

[4 of 4 positions shown; findings below may reference images not displayed]

FINDINGS: Multiple views of the left knee demonstrate no acute displaced
fracture, subluxation, dislocation, or soft tissue abnormality.
IMPRESSION: No acute radiographic abnormality of the left knee.

## 2014-09-26 MED ORDER — DICLOFENAC SODIUM 75 MG PO TBEC: 75.0000 mg | DELAYED_RELEASE_TABLET | Freq: Two times a day (BID) | ORAL | Status: DC

## 2014-09-26 NOTE — ED Notes (Signed)
Reports "banging" her left knee on object approx 3 months ago - did not have medical eval; continued with intermittent pains that occurred if pt sat for a period of time, then attempted to get up and ambulate.  Over past 2 wks pain had increased.  Last night pain was "excruciating" when she attempted to bear weight after being in sitting position.  Has tried 500mg  Tyl without relief.

## 2014-09-26 NOTE — ED Provider Notes (Signed)
CSN: [id number]     Arrival date & time 09/26/14  1440 History   First MD Initiated Contact with Patient 09/26/14 1615     Chief Complaint  Patient presents with  . Knee Pain   (Consider location/radiation/quality/duration/timing/severity/associated sxs/prior Treatment) HPI Comments: Patient presents with a painful left knee. As noted by RN; she had an injury 3 months ago hitting it on a wooden table. She had immediate pain following this; but then it improved. In the last few weeks she noted worsening pain along the patella and upper femur. Pain is worse with flexion and then standing up on an outstretched leg. No swelling, redness, locking or buckling is noted. No pain when the leg is stretched to extension. Pain when ambulating in a flexed view.   Patient is a 39 y.o. female presenting with knee pain. The history is provided by the patient.  Knee Pain   Past Medical History  Diagnosis Date  . Heart murmur   . Chronic headaches   . Anemia    Past Surgical History  Procedure Laterality Date  . Tubal ligation     Family History  Problem Relation Age of Onset  . Arthritis Mother   . Depression Mother   . Hypertension Father    History  Substance Use Topics  . Smoking status: Never Smoker   . Smokeless tobacco: Never Used  . Alcohol Use: No   OB History    No data available     Review of Systems  All other systems reviewed and are negative.   Allergies  Hydrocodone  Home Medications   Prior to Admission medications   Medication Sig Start Date End Date Taking? Authorizing Provider  acetaminophen (TYLENOL) 500 MG tablet Take 1,200 mg by mouth. Arthritis brand   Yes Historical Provider, MD  diclofenac (VOLTAREN) 75 MG EC tablet Take 1 tablet (75 mg total) by mouth 2 (two) times daily. 09/26/14   Bjorn Pippin, PA-C  ibuprofen (ADVIL,MOTRIN) 800 MG tablet Take 1 tablet (800 mg total) by mouth 3 (three) times daily. 01/26/13   Tatyana A Kirichenko, PA-C   oxyCODONE-acetaminophen (PERCOCET/ROXICET) 5-325 MG per tablet Take 2 tablets by mouth every 4 (four) hours as needed for moderate pain or severe pain. 02/13/14   Kaitlyn Szekalski, PA-C  penicillin v potassium (VEETID) 500 MG tablet Take 1 tablet (500 mg total) by mouth 4 (four) times daily. 02/13/14   Kaitlyn Szekalski, PA-C   BP 130/87 mmHg  Pulse 101  Temp(Src) 98 F (36.7 C) (Oral)  Resp 16  SpO2 100%  LMP 09/13/2014 (Exact Date) Physical Exam  Constitutional: She is oriented to person, place, and time. She appears well-developed and well-nourished. No distress.  Pulmonary/Chest: Effort normal.  Musculoskeletal: She exhibits tenderness. She exhibits no edema.  No effusion. Pain to palpation along the patellar inset. Full ROM with pain in full flexion. Positive patellar apprehension test. Negative [place]  Neurological: She is alert and oriented to person, place, and time.  Skin: Skin is warm and dry. She is not diaphoretic.  Psychiatric: Her behavior is normal.  Nursing note and vitals reviewed.   ED Course  Procedures (including critical care time) Labs Review Labs Reviewed - No data to display  Imaging Review Dg Knee Complete 4 Views Left  09/26/2014   CLINICAL DATA:  39 year old female with anterior and medial left knee pain for the past 2 weeks.  EXAM: LEFT KNEE - COMPLETE 4+ VIEW  COMPARISON:  02/01/2010.  FINDINGS: Multiple views of  the left knee demonstrate no acute displaced fracture, subluxation, dislocation, or soft tissue abnormality.  IMPRESSION: No acute radiographic abnormality of the left knee.   Electronically Signed   By: Vinnie Langton M.D.   On: 09/26/2014 16:51     MDM   1. Patellar tendonitis of left knee   2. Knee pain, acute, left    Treat symptomatically with knee brace, NSAIDs and rest. If symptoms persist f/u with Orthopedics (info given)    Bjorn Pippin, PA-C 09/26/14 1713

## 2014-09-26 NOTE — Discharge Instructions (Signed)
Patellofemoral Syndrome If you have had pain in the front of your knee for a long time, chances are good that you have patellofemoral syndrome. The word patella refers to the kneecap. Femoral (or femur) refers to the thigh bone. That is the bone the kneecap sits on. The kneecap is shaped like a triangle. Its job is to protect the knee and to improve the efficiency of your thigh muscles (quadriceps). The underside of the kneecap is made of smooth tissue (cartilage). This lets the kneecap slide up and down as the knee moves. Sometimes this cartilage becomes soft. Your healthcare provider may say the cartilage breaks down. That is patellofemoral syndrome. It can affect one knee, or both. The condition is sometimes called patellofemoral pain syndrome. That is because the condition is painful. The pain usually gets worse with activity. Sitting for a long time with the knee bent also makes the pain worse. It usually gets better with rest and proper treatment. CAUSES  No one is sure why some people develop this problem and others do not. Runners often get it. One name for the condition is "runner's knee." However, some people run for years and never have knee pain. Certain things seem to make patellofemoral syndrome more likely. They include:  Moving out of alignment. The kneecap is supposed to move in a straight line when the thigh muscle pulls on it. Sometimes the kneecap moves in poor alignment. That can make the knee swell and hurt. Some experts believe it also wears down the cartilage.  Injury to the kneecap.  Strain on the knee. This may occur during sports activity. Soccer, running, skiing and cycling can put excess stress on the knee.  Being flat-footed or knock-kneed. SYMPTOMS   Knee pain.  Pain under the kneecap. This is usually a dull, aching pain.  Pain in the knee when doing certain things: squatting, kneeling, going up or down stairs.  Pain in the knee when you stand up after sitting down  for awhile.  Tightness in the knee.  Loss of muscle strength in the thigh.  Swelling of the knee. DIAGNOSIS  Healthcare providers often send people with knee pain to an orthopedic caregiver. This person has special training to treat problems with bones and joints. To decide what is causing your knee pain, your caregiver will probably:  Do a physical exam. This will probably include:  Asking about symptoms you have noticed.  Asking about your activities and any injuries.  Feeling your knee. Moving it. This will help test the knee's strength. It will also check alignment (whether the knee and leg are aligned normally).  Order some tests, such as:  Imaging tests. They create pictures of the inside of the knee. Tests may include:  X-rays.  Computed tomography (CT) scan. This uses X-rays and a computer to show more detail.  Magnetic resonance imaging (MRI). This test uses magnets, radio waves and a computer to make pictures. TREATMENT   Medication is almost always used first. It can relieve pain. It also can reduce swelling. Non-steroidal anti-inflammatory medicines (called NSAIDs) are usually suggested. Sometimes a stronger form is needed. A stronger form would require a prescription.  Other treatment may be needed after the swelling goes down. Possibilities include:  Exercise. Certain exercises can make the muscles around the knee stronger which decreases the pressure on the knee cap. This includes the thigh muscle. Certain exercises also may be suggested to increase your flexibility.  A knee brace. This gives the knee extra support  and helps align the movement of the knee cap.  Orthotics. These are special shoe inserts. They can help keep your leg and knee aligned.  Surgery is sometimes needed. This is rare. Options include:  Arthroscopy. The surgeon uses a special tool to remove any damaged pieces of the kneecap. Only a few small incisions (cuts) are needed.  Realignment.  This is open surgery. The goals are to reduce pressure and fix the way the kneecap moves. HOME CARE INSTRUCTIONS   Take any medication prescribed by your healthcare provider. Follow the directions carefully.  If your knee is swollen:  Put ice or cold packs on it. Do this for 20 to 30 minutes, 3 to 4 times a day.  Keep the knee raised. Make sure it is supported. Put a pillow under it.  Rest your knee. For example, take the elevator instead of the stairs for awhile. Or, take a break from sports activity that strain your knee. Try walking or swimming instead.  Whenever you are active:  Use an elastic bandage on your knee. This gives it support.  After any activity, put ice or cold packs on your knees. Do this for about 10 to 20 minutes.  Make sure you wear shoes that give good support. Make sure they are not worn down. The heels should not slant in or out. SEEK MEDICAL CARE IF:   Knee pain gets worse. Or it does not go away, even after taking pain medicine.  Swelling does not go down.  Your thigh muscle becomes weak.  You have an oral temperature above 102 F (38.9 C). SEEK IMMEDIATE MEDICAL CARE IF:  You have an oral temperature above 102 F (38.9 C), not controlled by medicine. Document Released: 08/02/2009 Document Revised: 11/06/2011 Document Reviewed: 11/03/2013 [facility] Patient Information 2015 [place], [place]. This information is not intended to replace advice given to you by your health care provider. Make sure you discuss any questions you have with your health care provider.

## 2014-11-19 ENCOUNTER — Ambulatory Visit (INDEPENDENT_AMBULATORY_CARE_PROVIDER_SITE_OTHER): Payer: BLUE CROSS/BLUE SHIELD | Admitting: Family Medicine

## 2014-11-19 ENCOUNTER — Encounter: Payer: Self-pay | Admitting: Family Medicine

## 2014-11-19 VITALS — BP 122/80 | Temp 98.4°F | Wt 228.2 lb

## 2014-11-19 DIAGNOSIS — N920 Excessive and frequent menstruation with regular cycle: Secondary | ICD-10-CM

## 2014-11-19 DIAGNOSIS — D649 Anemia, unspecified: Secondary | ICD-10-CM | POA: Diagnosis not present

## 2014-11-19 LAB — HEPATIC FUNCTION PANEL
ALT: 17 U/L (ref 0–35)
AST: 16 U/L (ref 0–37)
Albumin: 4.3 g/dL (ref 3.5–5.2)
Alkaline Phosphatase: 62 U/L (ref 39–117)
BILIRUBIN DIRECT: 0.1 mg/dL (ref 0.0–0.3)
BILIRUBIN TOTAL: 0.3 mg/dL (ref 0.2–1.2)
Total Protein: 8.3 g/dL (ref 6.0–8.3)

## 2014-11-19 LAB — BASIC METABOLIC PANEL
BUN: 10 mg/dL (ref 6–23)
CALCIUM: 9.8 mg/dL (ref 8.4–10.5)
CO2: 28 mEq/L (ref 19–32)
Chloride: 105 mEq/L (ref 96–112)
Creatinine, Ser: 0.75 mg/dL (ref 0.40–1.20)
GFR: 109.8 mL/min (ref >60.00)
GLUCOSE: 117 mg/dL — AB (ref 70–99)
Potassium: 4.1 mEq/L (ref 3.5–5.1)
Sodium: 137 mEq/L (ref 135–145)

## 2014-11-19 LAB — CBC WITH DIFFERENTIAL/PLATELET
BASOS ABS: 0 10*3/uL (ref 0.0–0.1)
Basophils Relative: 0.6 % (ref 0.0–3.0)
Eosinophils Absolute: 0.1 10*3/uL (ref 0.0–0.7)
Eosinophils Relative: 2.1 % (ref 0.0–5.0)
HCT: 34.3 % — ABNORMAL LOW (ref 36.0–46.0)
Hemoglobin: 10.9 g/dL — ABNORMAL LOW (ref 12.0–15.0)
LYMPHS ABS: 1.7 10*3/uL (ref 0.7–4.0)
Lymphocytes Relative: 25.8 % (ref 12.0–46.0)
MCHC: 31.7 g/dL (ref 30.0–36.0)
Monocytes Absolute: 0.6 10*3/uL (ref 0.1–1.0)
Monocytes Relative: 8.3 % (ref 3.0–12.0)
NEUTROS PCT: 63.2 % (ref 43.0–77.0)
Neutro Abs: 4.2 10*3/uL (ref 1.4–7.7)
PLATELETS: 275 10*3/uL (ref 150.0–400.0)
RBC: 5.06 Mil/uL (ref 3.87–5.11)
RDW: 16 % — ABNORMAL HIGH (ref 11.5–15.5)
WBC: 6.7 10*3/uL (ref 4.0–10.5)

## 2014-11-19 LAB — FERRITIN: Ferritin: 3.7 ng/mL — ABNORMAL LOW (ref 10.0–291.0)

## 2014-11-19 LAB — IRON: IRON: 28 ug/dL — AB (ref 42–145)

## 2014-11-19 LAB — FOLATE: Folate: 14.1 ng/mL (ref >5.9)

## 2014-11-19 LAB — TSH: TSH: 2.14 u[IU]/mL (ref 0.35–4.50)

## 2014-11-19 LAB — VITAMIN B12: VITAMIN B 12: 751 pg/mL (ref 211–911)

## 2014-11-19 MED ORDER — PHENTERMINE HCL 37.5 MG PO CAPS
37.5000 mg | ORAL_CAPSULE | ORAL | Status: DC
Start: 2014-11-19 — End: 2015-06-07

## 2014-11-19 MED ORDER — TOPIRAMATE 50 MG PO TABS: 50.0000 mg | ORAL_TABLET | Freq: Every day | ORAL | Status: DC

## 2014-11-19 NOTE — Progress Notes (Signed)
   Subjective:    Patient ID: Angela Hahn, female    DOB: Dec 18, 1975, 39 y.o.   MRN: [id number]  HPI Here for several issues. First she asks for help with weight loss. She tried plain phentermine last year but this did not help her at all. Then she tried some Qsymia as part of a research trial and she had great success for this. However now that the trial is over, she cannot afford to buy this. Also she has been extremely tired for the past few months. She has a hx of anemia and she thinks she is anemic again. She has been taking OTC iron pills with no help. She notes that her menses are very heavy for a full 7 days. She has not had a GYN exam for several years. She is S/P a tubal ligation.    Review of Systems  Constitutional: Positive for fatigue.  Respiratory: Negative.   Cardiovascular: Negative.   Endocrine: Negative.        Objective:   Physical Exam  Constitutional: She appears well-developed and well-nourished.  Neck: No thyromegaly present.  Cardiovascular: Normal rate, regular rhythm, normal heart sounds and intact distal pulses.   Pulmonary/Chest: Effort normal and breath sounds normal.  Lymphadenopathy:    She has no cervical adenopathy.          Assessment & Plan:  First she will try a combination of phentermine and topiramate for the weight problem. As for the fatigue, she most likely is very anemic again. We will get labs today to check her Hgb, ferritin, B12, TSH, etc. I will refer her to GYN for an exam and to evaluate her heavy menses. I think she would be an excellent candidate for endometrial ablation.

## 2014-11-19 NOTE — Progress Notes (Signed)
Pre visit review using our clinic review tool, if applicable. No additional management support is needed unless otherwise documented below in the visit note. 

## 2014-11-24 ENCOUNTER — Telehealth: Payer: Self-pay | Admitting: Family Medicine

## 2014-11-24 ENCOUNTER — Telehealth: Payer: Self-pay | Admitting: Obstetrics and Gynecology

## 2014-11-24 NOTE — Telephone Encounter (Signed)
Mailbox full

## 2014-11-24 NOTE — Telephone Encounter (Signed)
Called patient to schedule a new patient doctor referral but did not leave a message due to "mailbox full."

## 2014-11-24 NOTE — Telephone Encounter (Signed)
Can you take a look at recent lab results? Do we need to do anything now or can this wait until Dr. Sarajane Jews returns?

## 2014-11-24 NOTE — Telephone Encounter (Signed)
Pt would results of labs done last week.  pls call

## 2014-11-24 NOTE — Telephone Encounter (Signed)
Labs show recurrence of anemia  She should  Increase her iron intake and see gyne  theyr are trying to get in touch with her but  mailbox is full Need fu for dr Sarajane Jews review and  Plan FU.

## 2014-11-25 NOTE — Telephone Encounter (Signed)
I tried to reach pt by phone and the mail box was full.

## 2014-11-26 NOTE — Telephone Encounter (Signed)
I left a detailed message for pt with below information.

## 2014-12-02 ENCOUNTER — Encounter: Payer: Self-pay | Admitting: Obstetrics and Gynecology

## 2014-12-02 ENCOUNTER — Ambulatory Visit (INDEPENDENT_AMBULATORY_CARE_PROVIDER_SITE_OTHER): Payer: BLUE CROSS/BLUE SHIELD | Admitting: Obstetrics and Gynecology

## 2014-12-02 VITALS — BP 122/84 | HR 84 | Resp 16 | Ht 66.0 in | Wt 222.4 lb

## 2014-12-02 DIAGNOSIS — D509 Iron deficiency anemia, unspecified: Secondary | ICD-10-CM | POA: Diagnosis not present

## 2014-12-02 DIAGNOSIS — N920 Excessive and frequent menstruation with regular cycle: Secondary | ICD-10-CM | POA: Diagnosis not present

## 2014-12-02 NOTE — Patient Instructions (Signed)
Endometrial Biopsy Endometrial biopsy is a procedure in which a tissue sample is taken from inside the uterus. The tissue sample is then looked at under a microscope to see if the tissue is normal or abnormal. The endometrium is the lining of the uterus. This procedure helps determine where you are in your menstrual cycle and how hormone levels are affecting the lining of the uterus. This procedure may also be used to evaluate uterine bleeding or to diagnose endometrial cancer, tuberculosis, polyps, or inflammatory conditions.  LET [facility] CARE PROVIDER KNOW ABOUT:  Any allergies you have.  All medicines you are taking, including vitamins, herbs, eye drops, creams, and over-the-counter medicines.  Previous problems you or members of your family have had with the use of anesthetics.  Any blood disorders you have.  Previous surgeries you have had.  Medical conditions you have.  Possibility of pregnancy. RISKS AND COMPLICATIONS Generally, this is a safe procedure. However, as with any procedure, complications can occur. Possible complications include:  Bleeding.  Pelvic infection.  Puncture of the uterine wall with the biopsy device (rare). BEFORE THE PROCEDURE   Keep a record of your menstrual cycles as directed by your health care provider. You may need to schedule your procedure for a specific time in your cycle.  You may want to bring a sanitary pad to wear home after the procedure.  Arrange for someone to drive you home after the procedure if you will be given a medicine to help you relax (sedative). PROCEDURE   You may be given a sedative to relax you.  You will lie on an exam table with your feet and legs supported as in a pelvic exam.  Your health care provider will insert an instrument (speculum) into your vagina to see your cervix.  Your cervix will be cleansed with an antiseptic solution. A medicine (local anesthetic) will be used to numb the cervix.  A forceps  instrument (tenaculum) will be used to hold your cervix steady for the biopsy.  A thin, rodlike instrument (uterine sound) will be inserted through your cervix to determine the length of your uterus and the location where the biopsy sample will be removed.  A thin, flexible tube (catheter) will be inserted through your cervix and into the uterus. The catheter is used to collect the biopsy sample from your endometrial tissue.  The catheter and speculum will then be removed, and the tissue sample will be sent to a lab for examination. AFTER THE PROCEDURE  You will rest in a recovery area until you are ready to go home.  You may have mild cramping and a small amount of vaginal bleeding for a few days after the procedure. This is normal.  Make sure you find out how to get your test results. Document Released: 12/15/2004 Document Revised: 04/16/2013 Document Reviewed: 01/29/2013 [facility] Patient Information 2015 [place], [place]. This information is not intended to replace advice given to you by your health care provider. Make sure you discuss any questions you have with your health care provider.  Abdominal or Pelvic Ultrasound Ultrasound uses harmless sound waves instead of X-rays to take pictures of the inside of your body. A probe or wand device (transducer) is held up against your body to capture these pictures. The continually changing images can be recorded on videotape or film. Diagnostic ultrasound imaging is commonly called sonography or ultrasonography. There are different types of ultrasound exams. An ultrasound of the gallbladder, liver, and pancreas can show gallstones, masses, cysts,  inflammation, infection, or enlarged organs. An ultrasound of the kidneys can show cysts, masses, kidney stones, and kidney size and shape. A pelvic ultrasound can show the uterus, ovaries, and cysts or masses. An obstetrical ultrasound shows the position of the fetus, measurements for maturity, fetal  heartbeat, and fetal organs. A breast, thyroid, or testicular ultrasound can show if a nodule is solid or cystic. RISKS AND COMPLICATIONS Ultrasound has been used for many years and has never shown any harmful effects. Studies in humans have shown no direct link between the use of ultrasound and adverse outcomes. BEFORE THE PROCEDURE Other than drinking water, do not eat or drink for 8 to 12 hours before the test or as directed by your caregiver. Follow any other diet instructions from your caregiver. If you are having a pelvic ultrasound, you may need to drink a lot of liquid before the exam. A full bladder helps to see the organs behind the bladder better. PROCEDURE  There is no pain in an ultrasound exam. A gel is applied to your skin, and the transducer is then placed on the area to be examined. The gel may feel cool. The gel wipes off easily, but it is a good idea to wear clothing that is easily washable. The images from inside your body are displayed on one or more monitors that look like small television screens. The returning sound waves produce pictures of the organs that were in the path of the sound sent from the transducer. The room is usually darkened during the exam. This makes it easier to see the images on the monitor. The ultrasound exam should take less than 1 hour. AFTER THE PROCEDURE You can safely drive home and return to regular activities immediately after the exam. Ask when your test results will be ready. Make sure you get your test results. Document Released: 08/11/2000 Document Revised: 11/06/2011 Document Reviewed: 02/02/2011 [facility] Patient Information 2015 [place], [place]. This information is not intended to replace advice given to you by your health care provider. Make sure you discuss any questions you have with your health care provider.

## 2014-12-02 NOTE — Progress Notes (Signed)
Patient ID: Angela Hahn, female   DOB: 1975-12-13, 39 y.o.   MRN: [id number] GYNECOLOGY VISIT  PCP:   Alysia Penna, MD  Referring provider:   HPI: 39 y.o.   Single  African American  female   [phone] with Patient's last menstrual period was 11/29/2014 (exact date).   here for Menorrhagia.  Patient's mother is present for the discussion portion of the visit prior to her examination.   Patient states cycles last 7-10 days.   Painful and heavy menses.  Pad change every 1.5 hours.  Misses work for 3 days.  Doubles pad use at night.   Has a headache the whole time she is on her menses.  Takes iron sulfate 325 mg po q day.  Cycle can come less than every 3 weeks.  No bleeding in between menses.   Status post BTL.  Declines future childbearing.  Took birth control pills in the past and had no problems but still had cramping.  Bleeding was a little better.  Used Depo Provera in the past but had weight gain and hair loss.   Had labs on 10/2414 -  Hgb 10.9, Ferritin 3.7, Iron 28, TSH 2.14.   Pelvic ultrasound - Not done to date.   On weight loss medication - Phentermine and Topamax.  GYNECOLOGIC HISTORY: Patient's last menstrual period was 11/29/2014 (exact date). Sexually active:yes Partner preference: female Contraception:   Tubal Menopausal hormone therapy: none DES exposure: no  Blood transfusions:   no Sexually transmitted diseases:  no  GYN procedures and prior surgeries:  Tubal ligation Last mammogram:  never               Last pap and high risk HPV testing:   18 months ago with [facility]--normal per patient History of abnormal pap smear:  no   OB History    Gravida Para Term Preterm AB TAB SAB Ectopic Multiple Living   3 3 3       3        Past Medical History  Diagnosis Date  . Chronic headaches   . Anemia   . Dysmenorrhea   . Heart murmur     dx'd since childhood    Past Surgical History  Procedure Laterality Date  . Tubal ligation      Current Outpatient  Prescriptions  Medication Sig Dispense Refill  . acetaminophen (TYLENOL) 500 MG tablet Take 1,200 mg by mouth. Arthritis brand    . ferrous sulfate 325 (65 FE) MG tablet Take 325 mg by mouth daily with breakfast.    . phentermine 37.5 MG capsule Take 1 capsule (37.5 mg total) by mouth every morning. 30 capsule 5  . topiramate (TOPAMAX) 50 MG tablet Take 1 tablet (50 mg total) by mouth daily. 30 tablet 5   No current facility-administered medications for this visit.     ALLERGIES: Hydrocodone  Family History  Problem Relation Age of Onset  . Arthritis Mother   . Depression Mother   . Hypertension Mother   . Diabetes Mother   . Hypertension Father   . Diabetes Father     History   Social History  . Marital Status: Single    Spouse Name: N/A  . Number of Children: N/A  . Years of Education: N/A   Occupational History  . Not on file.   Social History Main Topics  . Smoking status: Former Smoker    Quit date: 05/28/2014  . Smokeless tobacco: Never Used  . Alcohol Use: No  .  Drug Use: No  . Sexual Activity:    Partners: Male    Birth Control/ Protection: Surgical     Comment: Tubal   Other Topics Concern  . Not on file   Social History Narrative    ROS:  Pertinent items are noted in HPI.  PHYSICAL EXAMINATION:    BP 122/84 mmHg  Pulse 84  Resp 16  Ht 5\' 6"  (1.676 m)  Wt 222 lb 6.4 oz (100.88 kg)  BMI 35.91 kg/m2  LMP 11/29/2014 (Exact Date)   Wt Readings from Last 3 Encounters:  12/02/14 222 lb 6.4 oz (100.88 kg)  11/19/14 228 lb 3.2 oz (103.511 kg)  02/04/13 234 lb (106.142 kg)     Ht Readings from Last 3 Encounters:  12/02/14 5\' 6"  (1.676 m)  03/27/11 5' 7.5" (1.715 m)    General appearance: alert, cooperative and appears stated age Head: Normocephalic, without obvious abnormality, atraumatic Neck: no adenopathy, supple, symmetrical, trachea midline and thyroid not enlarged, symmetric, no tenderness/mass/nodules Lungs: clear to auscultation  bilaterally Heart: regular rate and rhythm Abdomen: obese, soft, non-tender; no masses,  no organomegaly Extremities: extremities normal, atraumatic, no cyanosis or edema No abnormal inguinal nodes palpated Neurologic: Grossly normal  Pelvic: External genitalia:  no lesions              Urethra:  normal appearing urethra with no masses, tenderness or lesions              Bartholins and Skenes: normal                 Vagina: normal appearing vagina with normal color and discharge, no lesions, small amount of red blood in vagina.               Cervix: normal appearance                 Bimanual Exam:  Uterus:  uterus is normal size, shape, consistency and nontender                                      Adnexa: normal adnexa in size, nontender and no masses  ASSESSMENT  Menorrhagia with iron deficiency anemia.  Dysmenorrhea.  Headache during menses.  Status post BTL.  On weight loss medications.  PLAN  Continue with iron sulfate and take with orange juice.  Discussed foods rich in iron.  Return for pelvic ultrasound and potential EMB.  Explained procedures and reasoning for these.  Discussed potential options for treatment - OCPs, Mirena IUD, endometrial ablation, hysterectomy.   30 minutes face to face time of which over 50% was spent in counseling.   An After Visit Summary was printed and given to the patient.

## 2014-12-03 ENCOUNTER — Other Ambulatory Visit: Payer: Self-pay | Admitting: *Deleted

## 2014-12-03 MED ORDER — FERROUS SULFATE 325 (65 FE) MG PO TABS: 325.0000 mg | ORAL_TABLET | Freq: Two times a day (BID) | ORAL | Status: DC

## 2015-04-30 ENCOUNTER — Encounter: Payer: Self-pay | Admitting: Obstetrics and Gynecology

## 2015-06-02 ENCOUNTER — Other Ambulatory Visit: Payer: Self-pay | Admitting: Family Medicine

## 2015-06-03 ENCOUNTER — Telehealth: Payer: Self-pay | Admitting: Family Medicine

## 2015-06-03 NOTE — Telephone Encounter (Signed)
Pt needs refill on phentermine 37.5 mg send to [facility] on [address]

## 2015-06-04 NOTE — Telephone Encounter (Signed)
Call in #[phone] with 5 rf 

## 2015-06-07 ENCOUNTER — Other Ambulatory Visit: Payer: Self-pay | Admitting: Family Medicine

## 2015-06-07 MED ORDER — PHENTERMINE HCL 37.5 MG PO CAPS: 37.5000 mg | ORAL_CAPSULE | ORAL | Status: DC

## 2015-06-07 NOTE — Telephone Encounter (Signed)
I called in script 

## 2015-06-21 ENCOUNTER — Encounter: Payer: Self-pay | Admitting: Obstetrics and Gynecology

## 2015-06-30 ENCOUNTER — Ambulatory Visit: Payer: BLUE CROSS/BLUE SHIELD | Admitting: Family Medicine

## 2015-07-09 ENCOUNTER — Other Ambulatory Visit: Payer: Self-pay | Admitting: *Deleted

## 2015-07-09 MED ORDER — TOPIRAMATE 50 MG PO TABS: 50.0000 mg | ORAL_TABLET | Freq: Every day | ORAL | Status: DC

## 2015-09-15 ENCOUNTER — Encounter (HOSPITAL_COMMUNITY): Payer: Self-pay | Admitting: *Deleted

## 2015-09-15 ENCOUNTER — Emergency Department (HOSPITAL_COMMUNITY)
Admission: EM | Admit: 2015-09-15 | Discharge: 2015-09-15 | Disposition: A | Discharge status: Home / Self Care | Payer: 59 | Attending: Emergency Medicine | Admitting: Emergency Medicine

## 2015-09-15 ENCOUNTER — Emergency Department (HOSPITAL_COMMUNITY): Payer: 59

## 2015-09-15 DIAGNOSIS — Y9389 Activity, other specified: Secondary | ICD-10-CM | POA: Diagnosis not present

## 2015-09-15 DIAGNOSIS — R011 Cardiac murmur, unspecified: Secondary | ICD-10-CM | POA: Insufficient documentation

## 2015-09-15 DIAGNOSIS — Z79899 Other long term (current) drug therapy: Secondary | ICD-10-CM | POA: Diagnosis not present

## 2015-09-15 DIAGNOSIS — Y92410 Unspecified street and highway as the place of occurrence of the external cause: Secondary | ICD-10-CM | POA: Insufficient documentation

## 2015-09-15 DIAGNOSIS — D649 Anemia, unspecified: Secondary | ICD-10-CM | POA: Diagnosis not present

## 2015-09-15 DIAGNOSIS — S3992XA Unspecified injury of lower back, initial encounter: Secondary | ICD-10-CM | POA: Diagnosis not present

## 2015-09-15 DIAGNOSIS — S199XXA Unspecified injury of neck, initial encounter: Secondary | ICD-10-CM | POA: Insufficient documentation

## 2015-09-15 DIAGNOSIS — S0990XA Unspecified injury of head, initial encounter: Secondary | ICD-10-CM | POA: Insufficient documentation

## 2015-09-15 DIAGNOSIS — S79911A Unspecified injury of right hip, initial encounter: Secondary | ICD-10-CM | POA: Diagnosis not present

## 2015-09-15 DIAGNOSIS — Z8742 Personal history of other diseases of the female genital tract: Secondary | ICD-10-CM | POA: Diagnosis not present

## 2015-09-15 DIAGNOSIS — G8929 Other chronic pain: Secondary | ICD-10-CM | POA: Diagnosis not present

## 2015-09-15 DIAGNOSIS — Z87891 Personal history of nicotine dependence: Secondary | ICD-10-CM | POA: Diagnosis not present

## 2015-09-15 DIAGNOSIS — Y998 Other external cause status: Secondary | ICD-10-CM | POA: Insufficient documentation

## 2015-09-15 DIAGNOSIS — S8991XA Unspecified injury of right lower leg, initial encounter: Secondary | ICD-10-CM | POA: Insufficient documentation

## 2015-09-15 LAB — CBC
HCT: 35.7 % — ABNORMAL LOW (ref 36.0–46.0)
Hemoglobin: 11 g/dL — ABNORMAL LOW (ref 12.0–15.0)
MCH: 23.6 pg — ABNORMAL LOW (ref 26.0–34.0)
MCHC: 30.8 g/dL (ref 30.0–36.0)
MCV: 76.4 fL — ABNORMAL LOW (ref 78.0–100.0)
Platelets: 278 10*3/uL (ref 150–400)
RBC: 4.67 MIL/uL (ref 3.87–5.11)
RDW: 15.6 % — AB (ref 11.5–15.5)
WBC: 7.6 10*3/uL (ref 4.0–10.5)

## 2015-09-15 LAB — BASIC METABOLIC PANEL
Anion gap: 7 (ref 5–15)
BUN: 6 mg/dL (ref 6–20)
CALCIUM: 9.5 mg/dL (ref 8.9–10.3)
CO2: 26 mmol/L (ref 22–32)
CREATININE: 0.74 mg/dL (ref 0.44–1.00)
Chloride: 104 mmol/L (ref 101–111)
GFR calc non Af Amer: >60 mL/min (ref >60)
Glucose, Bld: 117 mg/dL — ABNORMAL HIGH (ref 65–99)
Potassium: 4.8 mmol/L (ref 3.5–5.1)
SODIUM: 137 mmol/L (ref 135–145)
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
## 2015-09-15 MED ORDER — KETOROLAC TROMETHAMINE 15 MG/ML IJ SOLN
15.0000 mg | Freq: Once | INTRAMUSCULAR | Status: AC
  Administered 2015-09-15: 15 mg via INTRAMUSCULAR
  Filled 2015-09-15: qty 1

## 2015-09-15 MED ORDER — CYCLOBENZAPRINE HCL 10 MG PO TABS: 10.0000 mg | ORAL_TABLET | Freq: Two times a day (BID) | ORAL | Status: DC | PRN

## 2015-09-15 MED ORDER — IBUPROFEN 800 MG PO TABS
800.0000 mg | ORAL_TABLET | Freq: Once | ORAL | Status: AC
  Administered 2015-09-15: 800 mg via ORAL
  Filled 2015-09-15: qty 1

## 2015-09-15 NOTE — ED Notes (Signed)
Patient transported to X-ray 
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
## 2015-09-15 NOTE — ED Notes (Signed)
Pt to CT

## 2015-09-20 ENCOUNTER — Ambulatory Visit: Payer: Self-pay | Admitting: Family Medicine

## 2015-09-21 ENCOUNTER — Ambulatory Visit: Payer: 59 | Admitting: Family Medicine

## 2015-09-21 ENCOUNTER — Telehealth: Payer: Self-pay | Admitting: Family Medicine

## 2015-09-21 NOTE — Telephone Encounter (Signed)
Pt was on schedule to see Dr. Sarajane Jews today, I tried to call pt and advise to reschedule, however no answer just busy signal.

## 2015-09-24 ENCOUNTER — Ambulatory Visit (INDEPENDENT_AMBULATORY_CARE_PROVIDER_SITE_OTHER): Payer: 59 | Admitting: Family Medicine

## 2015-09-24 ENCOUNTER — Encounter: Payer: Self-pay | Admitting: Family Medicine

## 2015-09-24 VITALS — BP 111/73 | HR 110 | Temp 99.3°F | Ht 66.0 in | Wt 200.0 lb

## 2015-09-24 DIAGNOSIS — S060X1D Concussion with loss of consciousness of 30 minutes or less, subsequent encounter: Secondary | ICD-10-CM

## 2015-09-24 DIAGNOSIS — S060X9A Concussion with loss of consciousness of unspecified duration, initial encounter: Secondary | ICD-10-CM

## 2015-09-24 DIAGNOSIS — S39012D Strain of muscle, fascia and tendon of lower back, subsequent encounter: Secondary | ICD-10-CM | POA: Diagnosis not present

## 2015-09-24 DIAGNOSIS — S161XXD Strain of muscle, fascia and tendon at neck level, subsequent encounter: Secondary | ICD-10-CM | POA: Diagnosis not present

## 2015-09-24 HISTORY — DX: Concussion with loss of consciousness of unspecified duration, initial encounter: S06.0X9A

## 2015-09-24 MED ORDER — TEMAZEPAM 30 MG PO CAPS: 30.0000 mg | ORAL_CAPSULE | Freq: Every evening | ORAL | Status: DC | PRN

## 2015-09-24 MED ORDER — INDOMETHACIN 50 MG PO CAPS: 50.0000 mg | ORAL_CAPSULE | Freq: Three times a day (TID) | ORAL | Status: DC
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
## 2015-09-24 NOTE — Progress Notes (Signed)
Pre visit review using our clinic review tool, if applicable. No additional management support is needed unless otherwise documented below in the visit note. 

## 2015-09-28 ENCOUNTER — Ambulatory Visit: Payer: Self-pay | Admitting: Family Medicine

## 2015-09-28 ENCOUNTER — Telehealth: Payer: Self-pay | Admitting: Family Medicine

## 2015-09-28 NOTE — Telephone Encounter (Signed)
Pt was on schedule to see Dr. Sarajane Jews today, I called pt and no answer.

## 2015-10-06 ENCOUNTER — Telehealth: Payer: Self-pay | Admitting: Family Medicine

## 2015-10-06 NOTE — Telephone Encounter (Signed)
Patient would like a referral for [facility] for severe back pain.  They have appointments open now and she is trying to get in ASAP.  They just need a referral.

## 2015-10-06 NOTE — Telephone Encounter (Signed)
I need more information. Is this for neck pain or low back pain? Also I find it hard to believe they would see her for the low back pain when she has had no imaging studies of this at all. Neurosurgeons almost always want scans done first

## 2015-10-07 NOTE — Telephone Encounter (Signed)
Pt is on schedule for 10/08/2015 to see Dr. Sarajane Jews to discuss this.

## 2015-10-08 ENCOUNTER — Ambulatory Visit (INDEPENDENT_AMBULATORY_CARE_PROVIDER_SITE_OTHER): Payer: Self-pay | Admitting: Family Medicine

## 2015-10-08 ENCOUNTER — Telehealth: Payer: Self-pay | Admitting: Family Medicine

## 2015-10-08 ENCOUNTER — Encounter: Payer: Self-pay | Admitting: Family Medicine

## 2015-10-08 ENCOUNTER — Ambulatory Visit: Payer: Self-pay | Admitting: Family Medicine

## 2015-10-08 VITALS — BP 116/80 | HR 106 | Temp 98.5°F | Ht 66.0 in | Wt 200.0 lb

## 2015-10-08 DIAGNOSIS — S161XXD Strain of muscle, fascia and tendon at neck level, subsequent encounter: Secondary | ICD-10-CM

## 2015-10-08 DIAGNOSIS — S060X1D Concussion with loss of consciousness of 30 minutes or less, subsequent encounter: Secondary | ICD-10-CM

## 2015-10-08 NOTE — Telephone Encounter (Signed)
I spoke with pt and she tried to call this morning to reschedule, she does not have transportation. She is going to reschedule for this afternoon. Can we remove the no show fee for this morning, okay per Dr. Sarajane Jews.
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
## 2015-10-08 NOTE — Progress Notes (Signed)
Pre visit review using our clinic review tool, if applicable. No additional management support is needed unless otherwise documented below in the visit note. 

## 2015-10-20 ENCOUNTER — Telehealth: Payer: Self-pay | Admitting: Family Medicine

## 2015-10-20 NOTE — Telephone Encounter (Signed)
Pt said she is still having back pain and does not see the neurologist until 10/26/15. She said she can not take tramadol it gives her a headache   Pharmacy; [address]

## 2015-10-20 NOTE — Telephone Encounter (Signed)
Call in a Medrol dose pack  

## 2015-10-21 ENCOUNTER — Encounter: Payer: Self-pay | Admitting: Neurology

## 2015-10-21 ENCOUNTER — Ambulatory Visit (INDEPENDENT_AMBULATORY_CARE_PROVIDER_SITE_OTHER): Payer: 59 | Admitting: Neurology

## 2015-10-21 VITALS — BP 118/76 | HR 68 | Ht 66.0 in | Wt 205.0 lb

## 2015-10-21 DIAGNOSIS — M545 Low back pain: Secondary | ICD-10-CM | POA: Diagnosis not present

## 2015-10-21 DIAGNOSIS — H8110 Benign paroxysmal vertigo, unspecified ear: Secondary | ICD-10-CM

## 2015-10-21 DIAGNOSIS — F0781 Postconcussional syndrome: Secondary | ICD-10-CM

## 2015-10-21 NOTE — Patient Instructions (Addendum)
  I want you to be active.  Start with walking outside daily down the street and back and add a little distance daily.   In addition to this I recommend......    1.  To help reduce HEADACHES: Coenzyme Q10 160mg  ONCE DAILY Riboflavin/Vitamin B2 400mg  ONCE DAILY Magnesium oxide 400mg  ONCE - TWICE DAILY May stop after headaches are resolved.                                                                                               To help with INSOMNIA: Melatonin 3-5mg  AT BEDTIME       Other medicines to help decrease inflammation Alpha Lipoic Acid 100mg  TWICE DAILY Turmeric 500mg  twice daily Iron 65mg  elemental daily  CALL IN 4 WEEKS.  IF SYMPTOMS NOT IMPROVED, THEN WE WILL START ANOTHER MEDICATION (NORTRIPTYLINE) 2.  Limit use of pain relievers to no more than 2 days out of the week 3.  Refer to physical therapy for back pain and vestibular rehab  4.  Follow up in 3 months.
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
## 2015-10-22 MED ORDER — METHYLPREDNISOLONE 4 MG PO TBPK: ORAL_TABLET | ORAL | Status: DC

## 2015-10-22 NOTE — Telephone Encounter (Signed)
I spoke with pt and sent script e-scribe. 

## 2015-10-22 NOTE — Addendum Note (Signed)
Addended by: Aggie Hacker A on: 10/22/2015 10:58 AM   Modules accepted: Orders

## 2015-10-26 ENCOUNTER — Ambulatory Visit: Payer: Self-pay | Admitting: Neurology

## 2015-10-29 ENCOUNTER — Telehealth: Payer: Self-pay | Admitting: Neurology

## 2015-10-29 NOTE — Telephone Encounter (Signed)
2nd fax sent to schedule pt. Also left vm on referral coordinator's voicemail. Will call pt back when I have more information.

## 2015-10-29 NOTE — Telephone Encounter (Signed)
PT called and said she has not heard from a physical therapist yet/Dawn CB#[phone]

## 2015-11-04 ENCOUNTER — Telehealth: Payer: Self-pay

## 2015-11-04 NOTE — Telephone Encounter (Signed)
Voicemail from pt states she has not heard about her PT yet. Referral was sent to [facility]. Per referral, Carole Civil called pt on 10/21/15 @ 2:53, but, "Called pt, vm is full, unable to leave a message". Will contact pt at reasonable hour to give her [facility]'s contact info.   [facility] [address] [place], [place] [address]  [place]: [phone]

## 2015-11-11 ENCOUNTER — Ambulatory Visit: Payer: 59 | Attending: Neurology | Admitting: Physical Therapy

## 2015-11-11 DIAGNOSIS — R42 Dizziness and giddiness: Secondary | ICD-10-CM | POA: Insufficient documentation

## 2015-11-11 DIAGNOSIS — G8929 Other chronic pain: Secondary | ICD-10-CM | POA: Diagnosis present

## 2015-11-11 DIAGNOSIS — M2560 Stiffness of unspecified joint, not elsewhere classified: Secondary | ICD-10-CM | POA: Insufficient documentation

## 2015-11-11 DIAGNOSIS — M545 Low back pain, unspecified: Secondary | ICD-10-CM

## 2015-11-11 NOTE — Patient Instructions (Signed)
Hip Flexor Stretch    Lying on back near edge of bed, bend one leg, foot flat. Hang other leg over edge, relaxed, thigh resting entirely on bed for __20-30__ seconds. Repeat __3__ times. Do __2__ sessions per day. Advanced Exercise: Bend knee back keeping thigh in contact with bed.  http://gt2.exer.us/347   Copyright  VHI. All rights reserved.  Piriformis Stretch, Sitting    Sit, one ankle on opposite knee, same-side hand on crossed knee. Push down on knee, keeping spine straight. Lean torso forward, with flat back, until tension is felt in hamstrings and gluteals of crossed-leg side. Hold _20-30__ seconds.  Repeat _3__ times per session. Do __2_ sessions per day.  Copyright  VHI. All rights reserved.  Chair Sitting    Sit at edge of seat, spine straight, one leg extended. Put a hand on each thigh and bend forward from the hip, keeping spine straight. Allow hand on extended leg to reach toward toes. Support upper body with other arm. Hold _20-30__ seconds. Repeat _3__ times per session. Do _2__ sessions per day.  Copyright  VHI. All rights reserved.
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
## 2015-11-19 ENCOUNTER — Ambulatory Visit: Payer: 59 | Admitting: Physical Therapy

## 2015-11-26 ENCOUNTER — Ambulatory Visit: Payer: 59 | Attending: Neurology | Admitting: Physical Therapy

## 2015-11-26 ENCOUNTER — Telehealth: Payer: Self-pay | Admitting: Physical Therapy

## 2015-11-26 NOTE — Telephone Encounter (Signed)
Attempted to contact patient to notify of missed PT appointments on 11/19/15 and 11/26/15. Unable to leave voicemail due to mailbox full. Will plan to discharge if patient not present for next schedule PT session (12/03/15 at 11:45 am) without calling to cancel.  Billie Ruddy, PT, DPT [facility] [address] [place], [place], [address] Phone: [phone]   Fax:  [phone] 11/26/2015, 12:44 PM

## 2015-12-03 ENCOUNTER — Telehealth: Payer: Self-pay | Admitting: Physical Therapy

## 2015-12-03 ENCOUNTER — Ambulatory Visit: Payer: 59 | Attending: Neurology | Admitting: Physical Therapy

## 2015-12-03 NOTE — Telephone Encounter (Signed)
Attempted to contact patient due to 3 consecutive no-shows. No answer; unable to leave voicemail. Will discharge patient from PT at this time.  Billie Ruddy, PT, DPT [facility] [address] [place], [place], [address] Phone: [phone]   Fax:  [phone] 12/03/2015, 3:27 PM

## 2015-12-04 ENCOUNTER — Encounter: Payer: Self-pay | Admitting: Physical Therapy

## 2015-12-04 NOTE — Therapy (Signed)
[facility] [address] [place], [place], [address] Phone: [phone]   Fax:  [phone]  Patient Details  Name: Angela Hahn MRN: [id number] Date of Birth: 1975/10/12 Referring Provider:  Metta Clines, DO  Encounter Date: 12/04/2015  PHYSICAL THERAPY DISCHARGE SUMMARY  Visits from Start of Care: 1   Current functional level related to goals / functional outcomes: Unknown, as patient did not return to PT after initial session.      PT Long Term Goals - 11/11/15 1523    PT LONG TERM GOAL #1   Title Pt will demonstrate initiation and independence with HEP in ordrer to maximize functional gains made in PT.   Baseline TARGET DATE = 12/11/15   Time 4   Period Weeks   Status New   PT LONG TERM GOAL #2   Title The patient will negotiate 10 stairs with less than or equal to 2 point increase in low back pain to demonstrate improvements in pain and functional mobility.   Time 4   Period Weeks   Status New   PT LONG TERM GOAL #3   Title Assess and treat for positional vertigo/vestibular disorder if warranted.   Time 4   Period Weeks   Status New   PT LONG TERM GOAL #4   Title The patient will improve DHI by at least 18 points in order to demonstrate decreased dizziness and improved function.   Baseline DHI = 74% on 11/11/15   Time 4   Period Weeks   Status New   PT LONG TERM GOAL #5   Title The patient will improve 90/90 hamstring test on R by at least 10 degrees in order to demonstrate inc flexibility to dec pain in low back.   Baseline 90/90 hamstring test R = 68 degrees from 0 deg   Time 4   Period Weeks   Status New        Remaining deficits: Unknown, as patient did not return to PT after initial session.    Education / Equipment: HEP.  Plan:                                                    Patient goals were not met. Patient is being discharged due to not returning since the last visit.  ?????        Patient missed 3 consecutive PT visits without calling/cancelling. This PT attempted to call patient twice; no answer and unable to leave voicemail.    Billie Ruddy, PT, DPT [facility] [address] [place], [place], [address] Phone: [phone]   Fax:  [phone] 12/04/2015, 3:10 PM

## 2015-12-06 ENCOUNTER — Ambulatory Visit: Payer: Self-pay | Admitting: Adult Health

## 2015-12-06 DIAGNOSIS — Z0289 Encounter for other administrative examinations: Secondary | ICD-10-CM

## 2015-12-07 ENCOUNTER — Ambulatory Visit: Payer: Self-pay | Admitting: Physical Therapy

## 2015-12-20 ENCOUNTER — Telehealth: Payer: Self-pay | Admitting: Family Medicine

## 2015-12-20 NOTE — Telephone Encounter (Signed)
Pt is going to need another office visit to discuss time that was missed and also if she is going to miss anymore work. 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 I don't have any record of any forms.
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
## 2015-12-20 NOTE — Telephone Encounter (Signed)
Mailbox is full, unable to leave message.

## 2015-12-21 ENCOUNTER — Ambulatory Visit: Payer: Self-pay | Admitting: Family Medicine

## 2015-12-22 ENCOUNTER — Ambulatory Visit: Payer: 59 | Admitting: Family Medicine

## 2015-12-22 ENCOUNTER — Telehealth: Payer: Self-pay | Admitting: Family Medicine

## 2015-12-22 NOTE — Telephone Encounter (Signed)
Pt was on schedule to see Dr. Sarajane Jews today. I tried to reach pt by phone and no answer, mail box was full. I would have advised pt to reschedule this appointment.

## 2016-01-06 NOTE — Telephone Encounter (Signed)
Pt was a no show on 4/26

## 2016-01-07 ENCOUNTER — Ambulatory Visit (INDEPENDENT_AMBULATORY_CARE_PROVIDER_SITE_OTHER): Payer: 59 | Admitting: Family Medicine

## 2016-01-07 ENCOUNTER — Encounter: Payer: Self-pay | Admitting: Family Medicine

## 2016-01-07 VITALS — BP 115/66 | HR 95 | Temp 98.4°F | Ht 66.0 in | Wt 202.0 lb

## 2016-01-07 DIAGNOSIS — M545 Low back pain: Secondary | ICD-10-CM

## 2016-01-07 DIAGNOSIS — G8929 Other chronic pain: Secondary | ICD-10-CM | POA: Diagnosis not present

## 2016-01-07 MED ORDER — TRIAMCINOLONE ACETONIDE 0.1 % EX CREA: 1.0000 "application " | TOPICAL_CREAM | Freq: Two times a day (BID) | CUTANEOUS | Status: DC

## 2016-01-07 NOTE — Progress Notes (Signed)
Pre visit review using our clinic review tool, if applicable. No additional management support is needed unless otherwise documented below in the visit note. 
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
## 2016-01-10 DIAGNOSIS — Z7689 Persons encountering health services in other specified circumstances: Secondary | ICD-10-CM

## 2016-02-15 ENCOUNTER — Ambulatory Visit: Payer: Self-pay | Admitting: Neurology

## 2016-02-21 ENCOUNTER — Telehealth: Payer: Self-pay | Admitting: Family Medicine

## 2016-02-21 NOTE — Telephone Encounter (Signed)
Pt needs dr fry to check intermittent block on FLMA for her back pain. And refax. Pt said md has the original

## 2016-02-21 NOTE — Telephone Encounter (Signed)
Refill request for Phentermine 37.5 mg capsule and send to [facility].

## 2016-02-21 NOTE — Telephone Encounter (Signed)
I do not have any recent forms, she will need to request another form for signature. I tried to call pt and the mail box was full.

## 2016-02-21 NOTE — Telephone Encounter (Signed)
Call in #[phone] with 5 rf 

## 2016-02-22 MED ORDER — PHENTERMINE HCL 37.5 MG PO CAPS: 37.5000 mg | ORAL_CAPSULE | ORAL | Status: DC

## 2016-02-22 NOTE — Telephone Encounter (Signed)
I called in script 

## 2016-02-23 NOTE — Telephone Encounter (Signed)
Mailbox is full.

## 2016-02-25 NOTE — Telephone Encounter (Signed)
Pt will needs an appt to discuss intermittent fmla. Pt mailbox is full

## 2016-03-14 ENCOUNTER — Telehealth: Payer: Self-pay | Admitting: Family Medicine

## 2016-03-14 NOTE — Telephone Encounter (Signed)
Pt state that her ezcema cream is not working and would like something stronger.   Pharm: [facility]

## 2016-03-14 NOTE — Telephone Encounter (Signed)
Mailbox is full.

## 2016-03-14 NOTE — Telephone Encounter (Signed)
Call in Betamethasone valerate 0.1% cream to apply bid prn, 30 grams with 5 rf

## 2016-03-14 NOTE — Telephone Encounter (Signed)
Pt scheduled and I made patient aware her mail box is full unable to leave msgs.

## 2016-03-15 MED ORDER — BETAMETHASONE VALERATE 0.1 % EX CREA
TOPICAL_CREAM | CUTANEOUS | Status: DC
Start: 2016-03-15 — End: 2020-12-17

## 2016-03-15 NOTE — Telephone Encounter (Signed)
Rx sent to pharmacy   

## 2016-03-16 ENCOUNTER — Ambulatory Visit: Payer: 59 | Admitting: Family Medicine

## 2016-03-21 ENCOUNTER — Ambulatory Visit: Payer: 59 | Admitting: Family Medicine

## 2016-03-21 ENCOUNTER — Telehealth: Payer: Self-pay | Admitting: Family Medicine

## 2016-03-21 DIAGNOSIS — Z0289 Encounter for other administrative examinations: Secondary | ICD-10-CM

## 2016-03-21 NOTE — Telephone Encounter (Signed)
Pt was on schedule to see Dr. Sarajane Jews, I tried to reach pt by phone and no answer, pt can re schedule as needed.

## 2016-04-05 ENCOUNTER — Encounter: Payer: Self-pay | Admitting: Family Medicine

## 2016-04-05 ENCOUNTER — Ambulatory Visit (INDEPENDENT_AMBULATORY_CARE_PROVIDER_SITE_OTHER): Payer: 59 | Admitting: Family Medicine

## 2016-04-05 VITALS — BP 118/70 | HR 101 | Temp 99.0°F | Ht 66.0 in | Wt 199.0 lb

## 2016-04-05 DIAGNOSIS — G8929 Other chronic pain: Secondary | ICD-10-CM | POA: Diagnosis not present

## 2016-04-05 DIAGNOSIS — M545 Low back pain: Secondary | ICD-10-CM | POA: Diagnosis not present

## 2016-04-05 MED ORDER — GABAPENTIN 100 MG PO CAPS: 100.0000 mg | ORAL_CAPSULE | Freq: Three times a day (TID) | ORAL | 2 refills | Status: DC

## 2016-04-05 NOTE — Progress Notes (Signed)
Pre visit review using our clinic review tool, if applicable. No additional management support is needed unless otherwise documented below in the visit note. 
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
## 2016-05-26 ENCOUNTER — Other Ambulatory Visit: Payer: Self-pay | Admitting: Family Medicine

## 2016-05-26 NOTE — Telephone Encounter (Signed)
Call in #[phone] with 5 rf 

## 2016-07-25 ENCOUNTER — Other Ambulatory Visit: Payer: Self-pay | Admitting: Family Medicine

## 2016-07-25 NOTE — Telephone Encounter (Signed)
Is this okay to rx?

## 2016-09-05 ENCOUNTER — Other Ambulatory Visit: Payer: Self-pay | Admitting: Family Medicine

## 2016-09-05 NOTE — Telephone Encounter (Signed)
Call in Phentermine #30 with 5 rf, also Triamcinolone 45 grams with 2 rf

## 2016-09-06 NOTE — Telephone Encounter (Signed)
Rx called in to [facility] for phentermine and Triamcinolone cream. Patient is aware of the the Rx refill.

## 2016-10-21 ENCOUNTER — Encounter (HOSPITAL_COMMUNITY): Payer: Self-pay | Admitting: Emergency Medicine

## 2016-10-21 ENCOUNTER — Emergency Department (HOSPITAL_COMMUNITY): Payer: 59

## 2016-10-21 ENCOUNTER — Emergency Department (HOSPITAL_COMMUNITY)
Admission: EM | Admit: 2016-10-21 | Discharge: 2016-10-21 | Disposition: A | Discharge status: Home / Self Care | Payer: 59 | Attending: Emergency Medicine | Admitting: Emergency Medicine

## 2016-10-21 DIAGNOSIS — S8992XA Unspecified injury of left lower leg, initial encounter: Secondary | ICD-10-CM | POA: Insufficient documentation

## 2016-10-21 DIAGNOSIS — Y939 Activity, unspecified: Secondary | ICD-10-CM | POA: Insufficient documentation

## 2016-10-21 DIAGNOSIS — Y929 Unspecified place or not applicable: Secondary | ICD-10-CM | POA: Diagnosis not present

## 2016-10-21 DIAGNOSIS — Z87891 Personal history of nicotine dependence: Secondary | ICD-10-CM | POA: Diagnosis not present

## 2016-10-21 DIAGNOSIS — W010XXA Fall on same level from slipping, tripping and stumbling without subsequent striking against object, initial encounter: Secondary | ICD-10-CM | POA: Insufficient documentation

## 2016-10-21 DIAGNOSIS — Y999 Unspecified external cause status: Secondary | ICD-10-CM | POA: Diagnosis not present

## 2016-10-21 DIAGNOSIS — Z79899 Other long term (current) drug therapy: Secondary | ICD-10-CM | POA: Insufficient documentation

## 2016-10-21 IMAGING — CR DG KNEE COMPLETE 4+V*L*
4 series · 4 of 4 positions shown · non-contrast
Comparison: None.

CLINICAL DATA: Fall.  Injury to left knee.

EXAM:
LEFT KNEE - COMPLETE 4+ VIEW

[knee ap]
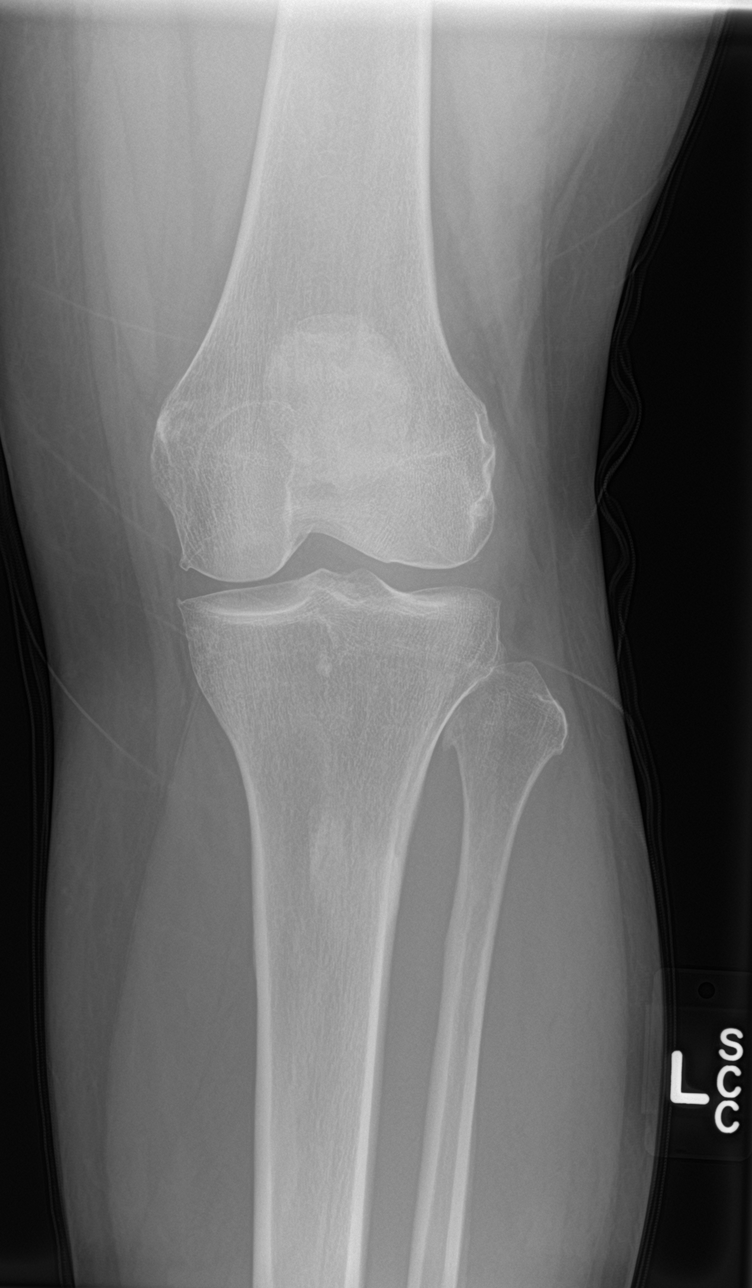

[knee lat]
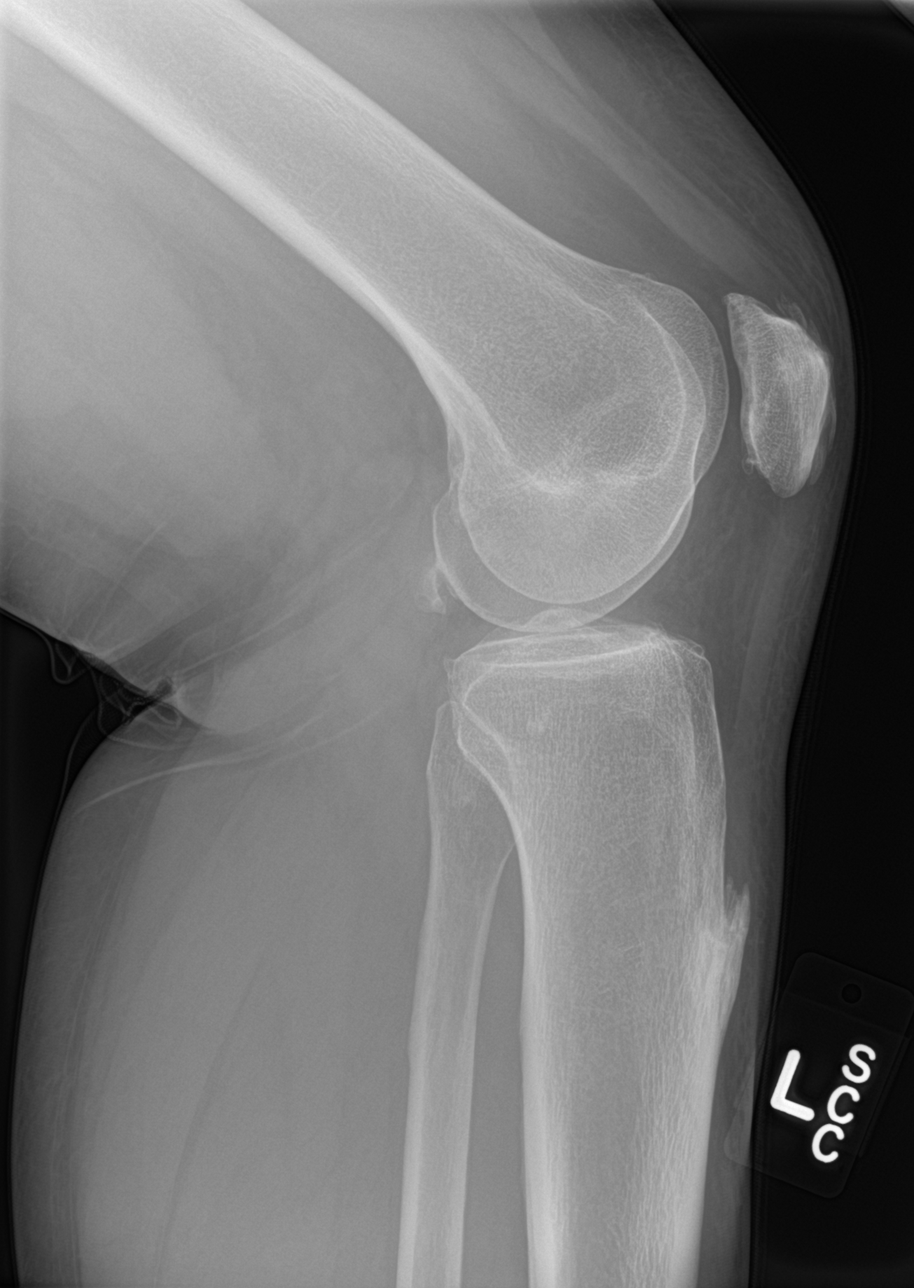

[knee obl (1 of 2)]
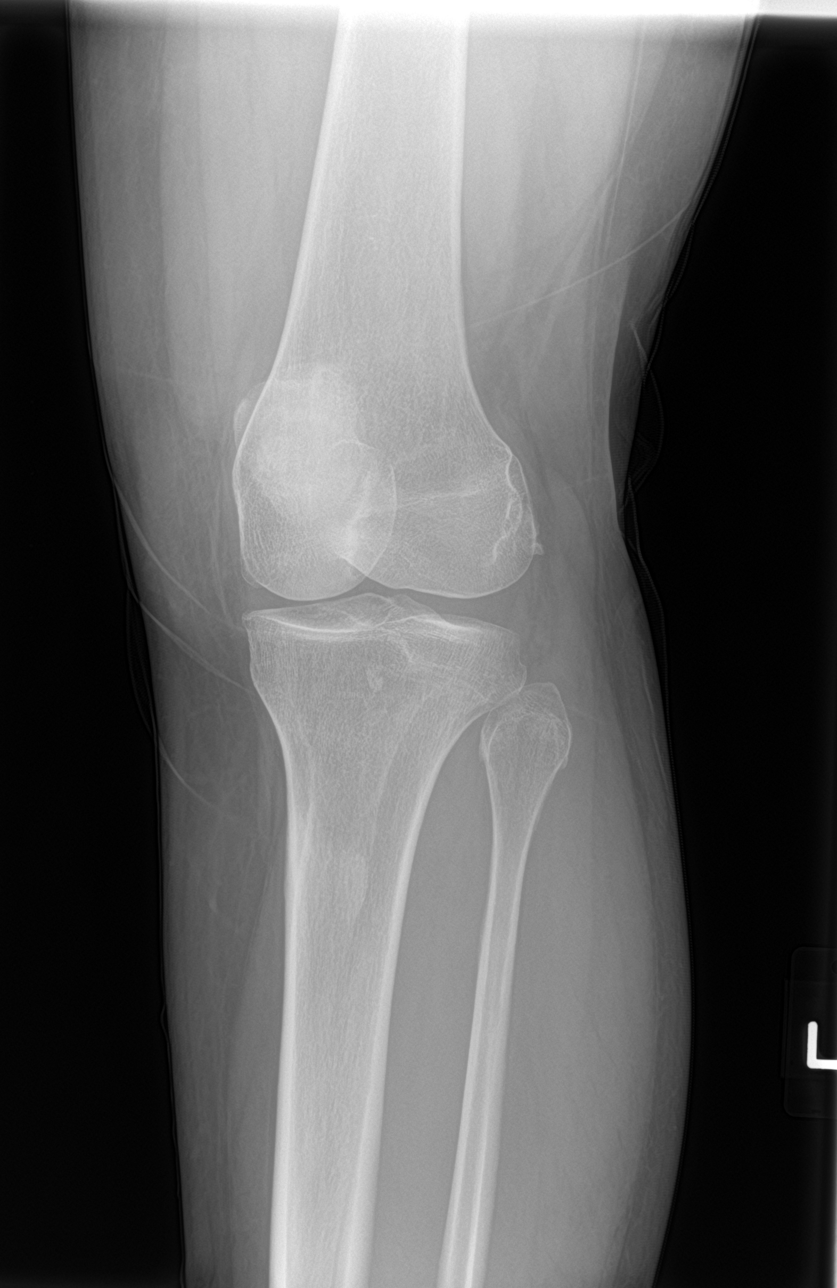

[knee obl (2 of 2)]
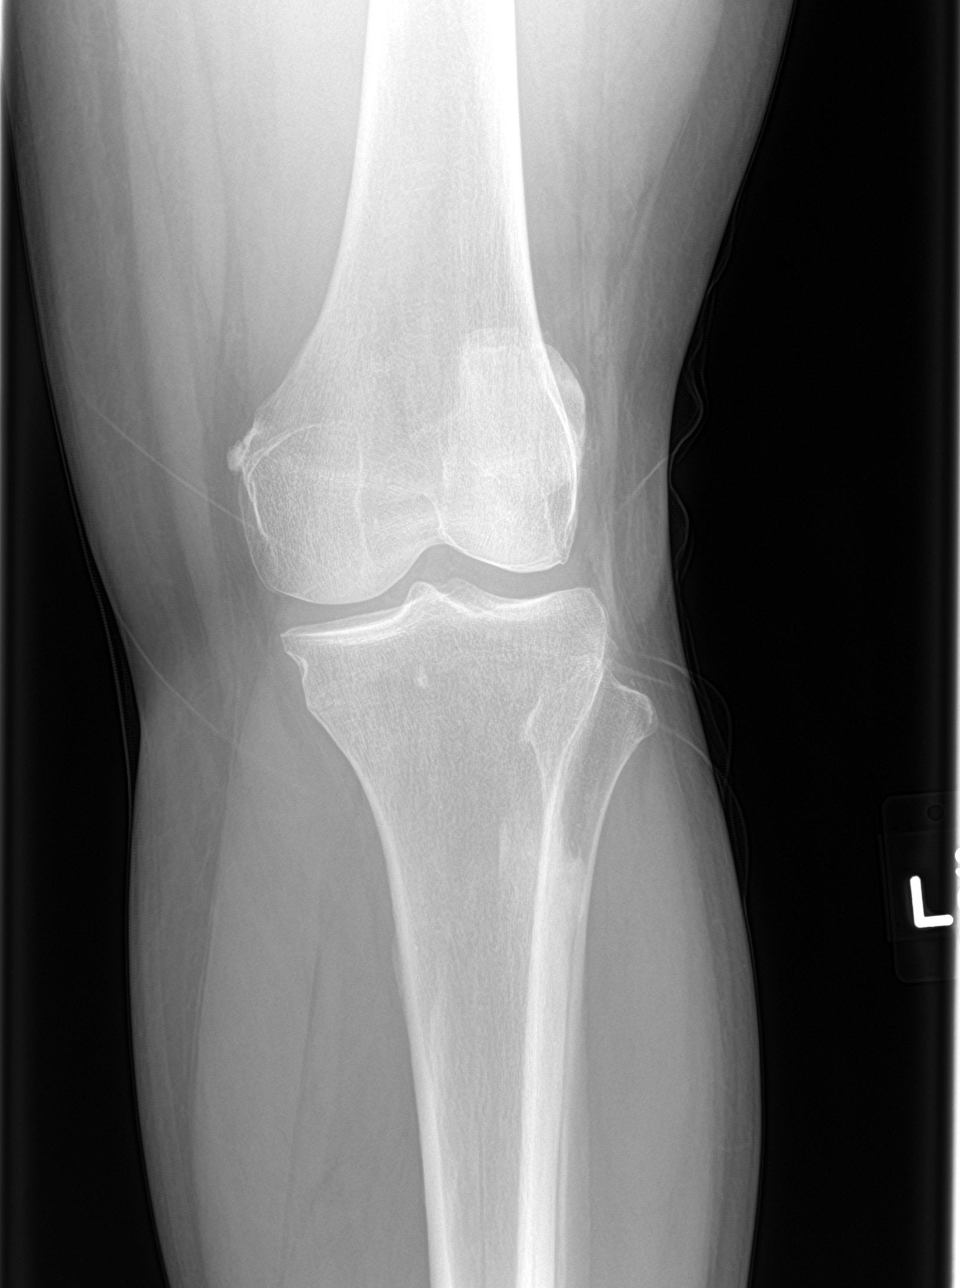

[4 of 4 positions shown; findings below may reference images not displayed]

FINDINGS: No joint effusion. No fracture or subluxation identified. Mild
patellofemoral and medial compartment osteoarthritis noted.
IMPRESSION: 1. No acute bone abnormality.
2. Mild osteoarthritis.

## 2016-10-21 MED ORDER — IBUPROFEN 800 MG PO TABS
800.0000 mg | ORAL_TABLET | Freq: Once | ORAL | Status: AC
  Administered 2016-10-21: 800 mg via ORAL
  Filled 2016-10-21: qty 1

## 2016-10-21 NOTE — ED Notes (Signed)
Patient Alert and oriented X4. Stable and ambulatory. Patient verbalized understanding of the discharge instructions.  Patient belongings were taken by the patient.  

## 2016-10-21 NOTE — Discharge Instructions (Signed)
You may need to have an MRI of your knee.  Please speak with either your primary care doctor or the orthopedic doctor about this.  Continue ibuprofen, ice, rest, and elevation.

## 2016-10-21 NOTE — ED Triage Notes (Signed)
Pt c/o trip and fall onto left knee today. Pt reports usually she constantly bangs that knee on her desk at work. Pt reports pain with bending knee.

## 2016-10-21 NOTE — Progress Notes (Signed)
Orthopedic Tech Progress Note Patient Details:  Angela Hahn [birth year] [id number]  Ortho Devices Type of Ortho Device: Knee Sleeve Ortho Device/Splint Location: lle Ortho Device/Splint Interventions: Ordered, Application   Karolee Stamps 10/21/2016, 11:17 PM

## 2016-10-21 NOTE — ED Provider Notes (Signed)
[facility] DEPT Provider Note   CSN: [id number] Arrival date & time: 10/21/16  1840  By signing my name below, I, Margit Banda and Hansel Feinstein, attest that this documentation has been prepared under the direction and in the presence of Montine Circle, PA-C.  Electronically Signed: Margit Banda and Hansel Feinstein, ED Scribe. 10/21/16. 9:52 PM.   History   Chief Complaint Chief Complaint  Patient presents with  . Knee Injury    HPI Angela Hahn is a 40 y.o. female who presents to the Emergency Department complaining of moderate left knee pain s/p fall that occurred today. Per pt, she tripped and fell forward onto the left knee today. She denies LOC or head injury. Pt also reports that she tripped and fell in the snow onto her left knee weeks ago and has had mild persistent pain since, which was worsened today. Pt states walking doesn't exacerbate the pain, but bending it does. She also reports occasional "popping" with ambulation and the sensation of knee instability. Pt notes that she has tried taking 800 mg ibuprofen with no relief, has elevated the knee applied an ace wrap with mild relief. Pt denies additional injuries.   The history is provided by the patient. No language interpreter was used.    Past Medical History:  Diagnosis Date  . Anemia   . Chronic headaches   . Dysmenorrhea   . Heart murmur    dx'd since childhood    Patient Active Problem List   Diagnosis Date Noted  . Concussion with loss of consciousness 09/24/2015  . Chronic low back pain 06/18/2012    Past Surgical History:  Procedure Laterality Date  . TUBAL LIGATION      OB History    Gravida Para Term Preterm AB Living   3 3 3     3    SAB TAB Ectopic Multiple Live Births                   Home Medications    Prior to Admission medications   Medication Sig Start Date End Date Taking? Authorizing Provider  betamethasone valerate (VALISONE) 0.1 % cream Apply twice a day as needed.  03/15/16   Laurey Morale, MD  ferrous sulfate 325 (65 FE) MG tablet Take 1 tablet (325 mg total) by mouth 2 (two) times daily with a meal. 12/03/14   Laurey Morale, MD  gabapentin (NEURONTIN) 100 MG capsule Take 1 capsule (100 mg total) by mouth 3 (three) times daily. 04/05/16   Laurey Morale, MD  ibuprofen (ADVIL,MOTRIN) 200 MG tablet Take 1,000 mg by mouth 2 (two) times daily.    Historical Provider, MD  phentermine 37.5 MG capsule take 1 capsule by mouth every morning 09/06/16   Laurey Morale, MD  temazepam (RESTORIL) 30 MG capsule take 1 capsule by mouth at bedtime 05/26/16   Laurey Morale, MD  topiramate (TOPAMAX) 50 MG tablet take 1 tablet by mouth once daily 07/27/16   Laurey Morale, MD  triamcinolone cream (KENALOG) 0.1 % APPLY TOPICALLY TWICE DAILY 09/06/16   Laurey Morale, MD    Family History Family History  Problem Relation Age of Onset  . Arthritis Mother   . Depression Mother   . Hypertension Mother   . Diabetes Mother   . Hypertension Father   . Diabetes Father     Social History Social History  Substance Use Topics  . Smoking status: Former Smoker    Quit date: 05/28/2014  .  Smokeless tobacco: Never Used  . Alcohol use No     Allergies   Hydrocodone and Tramadol   Review of Systems Review of Systems  Musculoskeletal: Positive for arthralgias (left knee).  Neurological: Negative for syncope.    Physical Exam Updated Vital Signs BP 148/84 (BP Location: Left Arm)   Pulse 114   Temp 98.4 F (36.9 C) (Oral)   Resp 16   Ht 5\' 6"  (1.676 m)   Wt 204 lb (92.5 kg)   LMP 10/10/2016   SpO2 100%   BMI 32.93 kg/m   Physical Exam Nursing note and vitals reviewed.  Constitutional: Pt appears well-developed and well-nourished. No distress.  HENT:  Head: Normocephalic and atraumatic.  Eyes: Conjunctivae are normal.  Neck: Normal range of motion.  Cardiovascular: Normal rate, regular rhythm. Intact distal pulses.   Capillary refill < 3 sec.  Pulmonary/Chest:  Effort normal and breath sounds normal.  Musculoskeletal:  Left lower extremity Pt exhibits no bony abnormality or deformity about the left knee, there is no obvious swelling, there is mild tenderness to palpation along the medial joint line.   ROM: 5/5  Strength: 5/5 Ambulates without difficulty  Neurological: Pt  is alert. Coordination normal.  Sensation: 5/5  Skin: Skin is warm and dry. Pt is not diaphoretic.  No evidence of open wound or skin tenting Psychiatric: Pt has a normal mood and affect.        ED Treatments / Results  DIAGNOSTIC STUDIES: Oxygen Saturation is 100% on RA, normal by my interpretation.   COORDINATION OF CARE: 9:32 PM-Discussed next steps with pt which includes a knee brace. Pt verbalized understanding and is agreeable with the plan.    Labs (all labs ordered are listed, but only abnormal results are displayed) Labs Reviewed - No data to display  EKG  EKG Interpretation None       Radiology Dg Knee Complete 4 Views Left  Result Date: 10/21/2016 CLINICAL DATA:  Fall.  Injury to left knee. EXAM: LEFT KNEE - COMPLETE 4+ VIEW COMPARISON:  None. FINDINGS: No joint effusion. No fracture or subluxation identified. Mild patellofemoral and medial compartment osteoarthritis noted. IMPRESSION: 1. No acute bone abnormality. 2. Mild osteoarthritis. Electronically Signed   By: Kerby Moors M.D.   On: 10/21/2016 20:02    Procedures Procedures (including critical care time)  Medications Ordered in ED Medications - No data to display   Initial Impression / Assessment and Plan / ED Course  I have reviewed the triage vital signs and the nursing notes.  Pertinent labs & imaging results that were available during my care of the patient were reviewed by me and considered in my medical decision making (see chart for details).     Patient X-Ray negative for obvious fracture or dislocation.  Pt advised to follow up with orthopedics. Patient given knee sleeve  while in ED, conservative therapy recommended and discussed. Patient will be discharged home & is agreeable with above plan. Returns precautions discussed. Pt appears safe for discharge.   Final Clinical Impressions(s) / ED Diagnoses   Final diagnoses:  Injury of left knee, initial encounter    New Prescriptions New Prescriptions   No medications on file    I personally performed the services described in this documentation, which was scribed in my presence. The recorded information has been reviewed and is accurate.      Montine Circle, PA-C 10/21/16 [place], MD 10/23/16 [phone]

## 2017-03-29 ENCOUNTER — Other Ambulatory Visit: Payer: Self-pay | Admitting: Family Medicine

## 2017-03-29 NOTE — Telephone Encounter (Signed)
Call in #30 with no rf. She needs an OV soon

## 2017-04-04 ENCOUNTER — Encounter (HOSPITAL_COMMUNITY): Payer: Self-pay | Admitting: *Deleted

## 2017-04-04 ENCOUNTER — Emergency Department (HOSPITAL_COMMUNITY)
Admission: EM | Admit: 2017-04-04 | Discharge: 2017-04-04 | Disposition: A | Discharge status: Home / Self Care | Payer: 59 | Attending: Emergency Medicine | Admitting: Emergency Medicine

## 2017-04-04 ENCOUNTER — Emergency Department (HOSPITAL_COMMUNITY): Payer: 59

## 2017-04-04 DIAGNOSIS — M79641 Pain in right hand: Secondary | ICD-10-CM | POA: Diagnosis present

## 2017-04-04 DIAGNOSIS — D649 Anemia, unspecified: Secondary | ICD-10-CM | POA: Diagnosis not present

## 2017-04-04 DIAGNOSIS — Z791 Long term (current) use of non-steroidal anti-inflammatories (NSAID): Secondary | ICD-10-CM | POA: Insufficient documentation

## 2017-04-04 DIAGNOSIS — Z87891 Personal history of nicotine dependence: Secondary | ICD-10-CM | POA: Diagnosis not present

## 2017-04-04 DIAGNOSIS — Z79899 Other long term (current) drug therapy: Secondary | ICD-10-CM | POA: Insufficient documentation

## 2017-04-04 IMAGING — DX DG HAND COMPLETE 3+V*R*
3 series · 3 of 3 positions shown · non-contrast
Comparison: None.

CLINICAL DATA: Crush injury in car door 3 days ago.

EXAM:
RIGHT HAND - COMPLETE 3+ VIEW

[hand pa]
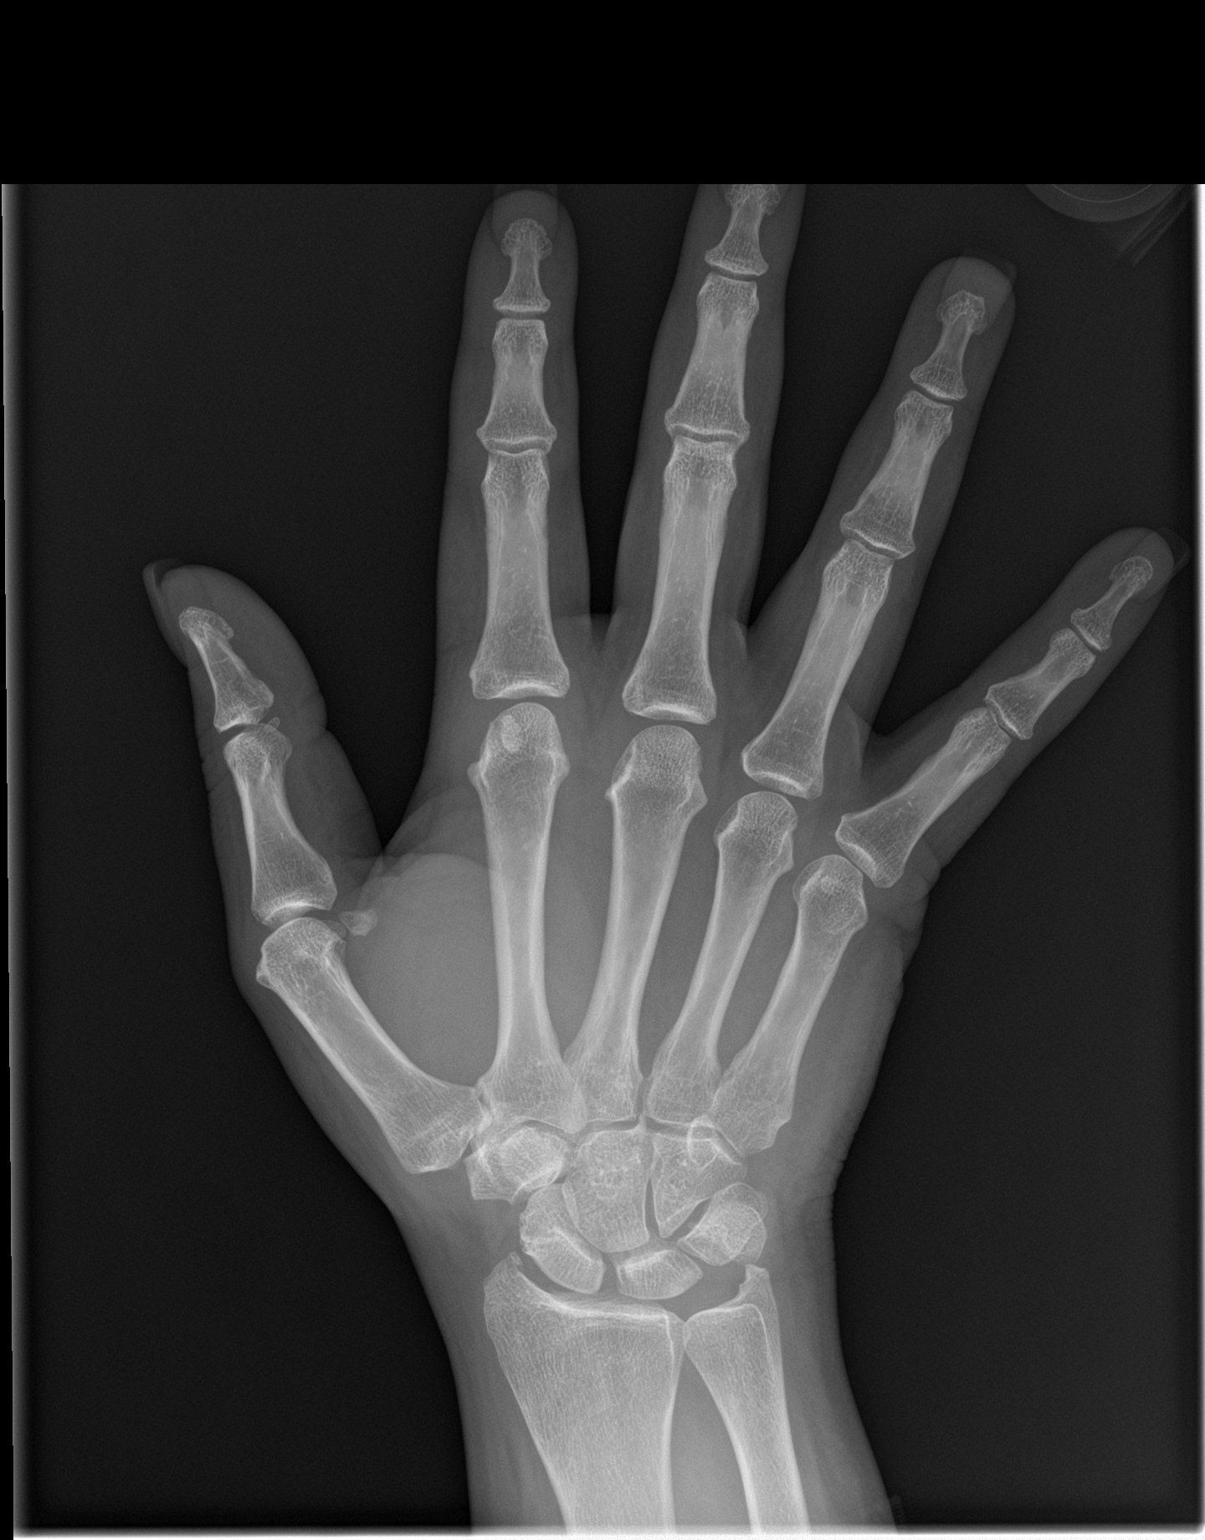

[hand obl]
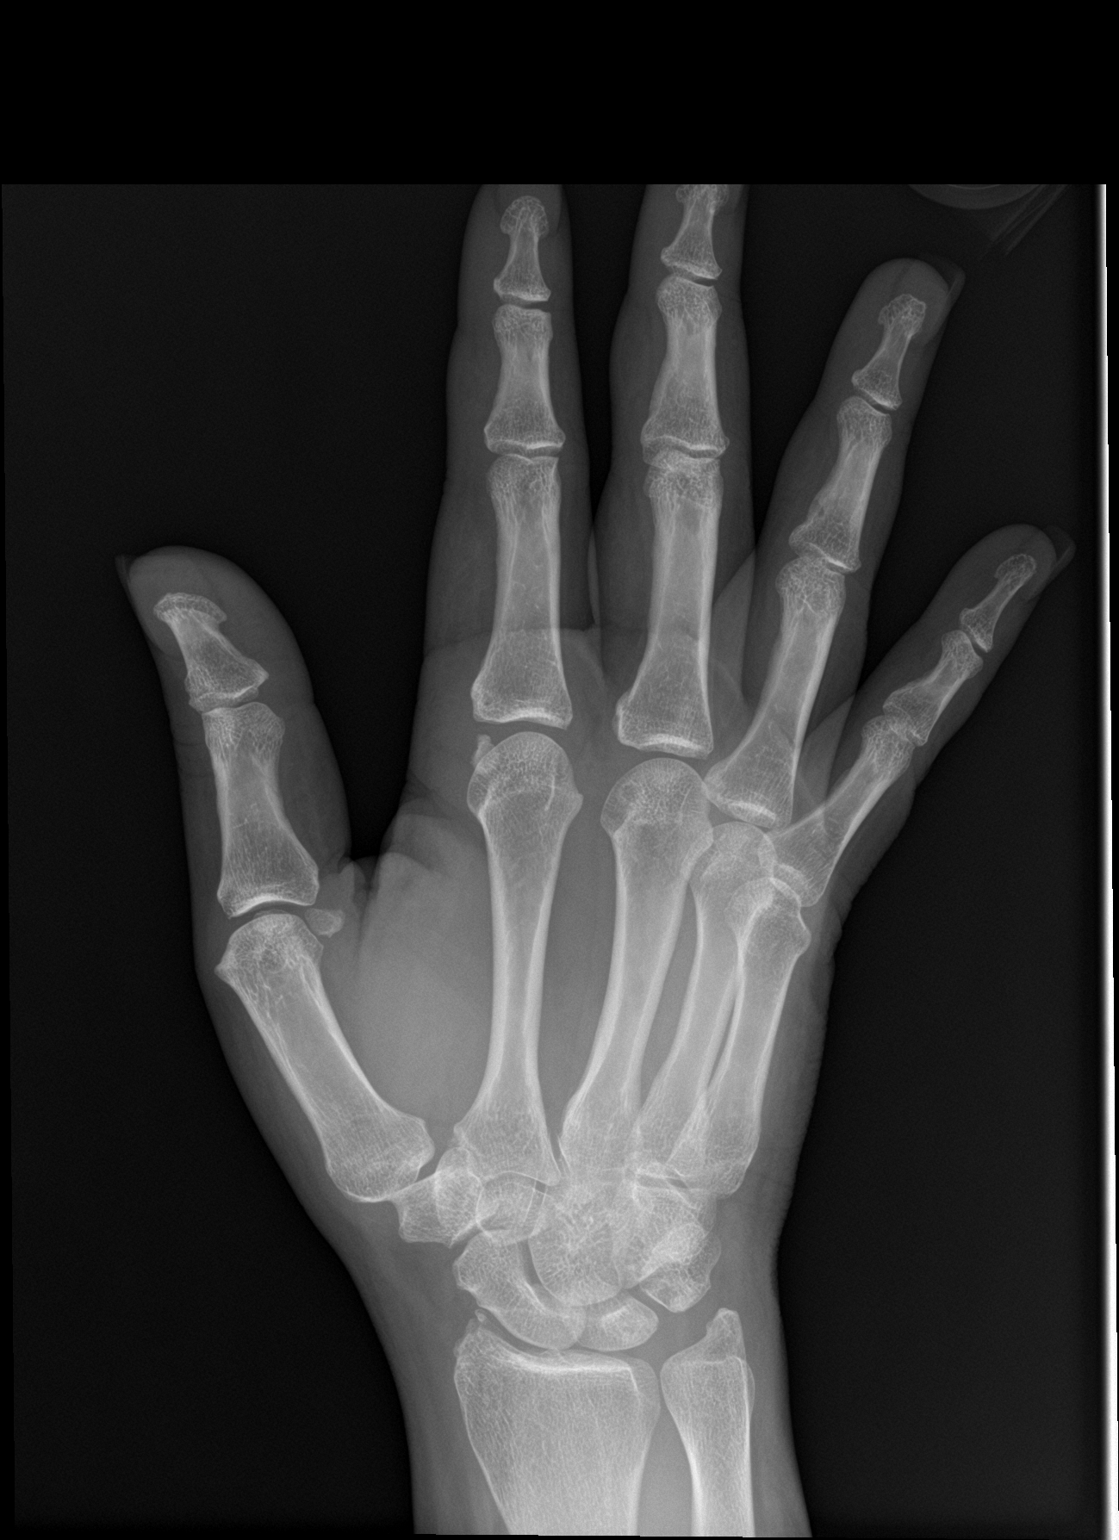

[hand lat]
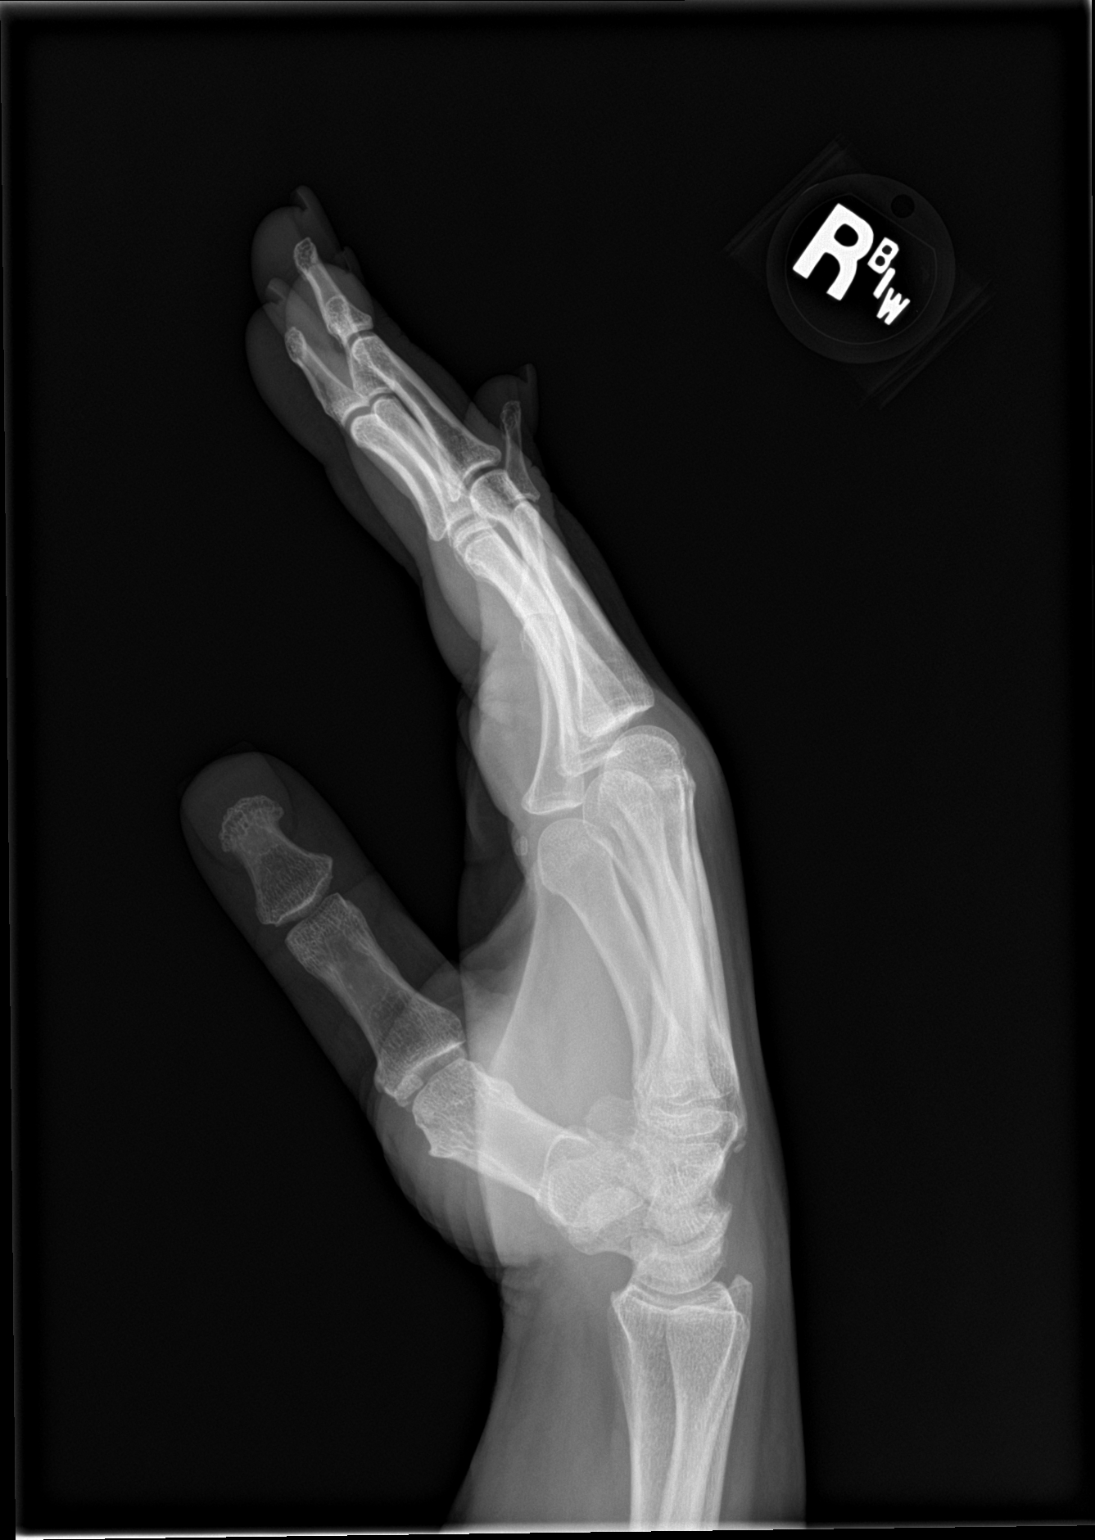

[3 of 3 positions shown; findings below may reference images not displayed]

FINDINGS: No fracture. No subluxation dislocation. Mild degenerative changes
are seen in the radiocarpal joint.
IMPRESSION: Negative.

## 2017-04-04 MED ORDER — IBUPROFEN 800 MG PO TABS
800.0000 mg | ORAL_TABLET | Freq: Once | ORAL | Status: AC
  Administered 2017-04-04: 800 mg via ORAL
  Filled 2017-04-04: qty 1

## 2017-04-04 MED ORDER — IBUPROFEN 800 MG PO TABS: 800.0000 mg | ORAL_TABLET | Freq: Three times a day (TID) | ORAL | 0 refills | Status: DC

## 2017-04-04 NOTE — ED Triage Notes (Signed)
Pt reports closing a car door several days ago and onset of right hand pain. Still has severe pain and mild swelling.

## 2017-04-04 NOTE — ED Provider Notes (Signed)
[facility] DEPT Provider Note   CSN: [id number] Arrival date & time: 04/04/17  7902  By signing my name below, I, Theresia Bough, attest that this documentation has been prepared under the direction and in the presence of non-physician practitioner, Larene Pickett., PA-C. Electronically Signed: Theresia Bough, ED Scribe. 04/04/17. 11:18 AM.  History   Chief Complaint Chief Complaint  Patient presents with  . Hand Pain   The history is provided by the patient. No language interpreter was used.   HPI Comments: Angela Hahn is a 41 y.o. female who presents to the Emergency Department complaining of gradual onset, moderate right hand onset yesterday. Pt's hand was slammed in a car door 2 days ago. Pt is left hand dominant. Pt describes pain as throbbing. Pt states pain is exacerbated by movement. No hx of surgery to the hand. Pt denies numbness or any other complaints at this time.  Past Medical History:  Diagnosis Date  . Anemia   . Chronic headaches   . Dysmenorrhea   . Heart murmur    dx'd since childhood    Patient Active Problem List   Diagnosis Date Noted  . Concussion with loss of consciousness 09/24/2015  . Chronic low back pain 06/18/2012    Past Surgical History:  Procedure Laterality Date  . TUBAL LIGATION      OB History    Gravida Para Term Preterm AB Living   3 3 3     3    SAB TAB Ectopic Multiple Live Births                   Home Medications    Prior to Admission medications   Medication Sig Start Date End Date Taking? Authorizing Provider  betamethasone valerate (VALISONE) 0.1 % cream Apply twice a day as needed. 03/15/16   Laurey Morale, MD  ferrous sulfate 325 (65 FE) MG tablet Take 1 tablet (325 mg total) by mouth 2 (two) times daily with a meal. 12/03/14   Laurey Morale, MD  gabapentin (NEURONTIN) 100 MG capsule Take 1 capsule (100 mg total) by mouth 3 (three) times daily. 04/05/16   Laurey Morale, MD  ibuprofen (ADVIL,MOTRIN) 200 MG tablet  Take 1,000 mg by mouth 2 (two) times daily.    [provider]  phentermine 37.5 MG capsule take 1 capsule by mouth every morning 03/30/17   Laurey Morale, MD  temazepam (RESTORIL) 30 MG capsule take 1 capsule by mouth at bedtime 05/26/16   Laurey Morale, MD  topiramate (TOPAMAX) 50 MG tablet take 1 tablet by mouth once daily 07/27/16   Laurey Morale, MD  triamcinolone cream (KENALOG) 0.1 % APPLY TOPICALLY TWICE DAILY 09/06/16   Laurey Morale, MD    Family History Family History  Problem Relation Age of Onset  . Arthritis Mother   . Depression Mother   . Hypertension Mother   . Diabetes Mother   . Hypertension Father   . Diabetes Father     Social History Social History  Substance Use Topics  . Smoking status: Former Smoker    Quit date: 05/28/2014  . Smokeless tobacco: Never Used  . Alcohol use No     Allergies   Hydrocodone and Tramadol   Review of Systems Review of Systems  Musculoskeletal: Positive for arthralgias and myalgias.  Neurological: Negative for numbness.     Physical Exam Updated Vital Signs BP 130/70 (BP Location: Left Arm)   Pulse 97  Temp 98.7 F (37.1 C) (Oral)   Resp 18   LMP 03/13/2017   SpO2 100%   Physical Exam  Constitutional: She is oriented to person, place, and time. She appears well-developed and well-nourished.  HENT:  Head: Normocephalic and atraumatic.  Mouth/Throat: Oropharynx is clear and moist.  Eyes: Pupils are equal, round, and reactive to light. Conjunctivae and EOM are normal.  Neck: Normal range of motion.  Cardiovascular: Normal rate, regular rhythm and normal heart sounds.   Pulmonary/Chest: Effort normal and breath sounds normal.  Abdominal: Soft. Bowel sounds are normal.  Musculoskeletal: She exhibits tenderness. She exhibits no edema or deformity.  Tenderness over the right 1st and 2nd metacarpals. There is no bony deformity or significant swelling. Difficulty with ROM secondary to pain.   Neurological:  She is alert and oriented to person, place, and time.  Skin: Skin is warm and dry.  Psychiatric: She has a normal mood and affect.  Nursing note and vitals reviewed.    ED Treatments / Results  DIAGNOSTIC STUDIES: Oxygen Saturation is 100% on RA, normal by my interpretation.   COORDINATION OF CARE: 11:16 AM-Discussed next steps with pt including an XR. Pt verbalized understanding and is agreeable with the plan.   Labs (all labs ordered are listed, but only abnormal results are displayed) Labs Reviewed - No data to display  EKG  EKG Interpretation None       Radiology Dg Hand Complete Right  Result Date: 04/04/2017 CLINICAL DATA:  Crush injury in car door 3 days ago. EXAM: RIGHT HAND - COMPLETE 3+ VIEW COMPARISON:  None. FINDINGS: No fracture. No subluxation dislocation. Mild degenerative changes are seen in the radiocarpal joint. IMPRESSION: Negative. Electronically Signed   By: Misty Stanley M.D.   On: 04/04/2017 11:40    Procedures Procedures (including critical care time)  Medications Ordered in ED Medications - No data to display   Initial Impression / Assessment and Plan / ED Course  I have reviewed the triage vital signs and the nursing notes.  Pertinent labs & imaging results that were available during my care of the patient were reviewed by me and considered in my medical decision making (see chart for details).  41 year old female here with right hand pain after excellently signing her hand in a car door. She has some tenderness over the first and second right metacarpals but there is no acute bony deformity. Her hand is neurovascularly intact. Screening x-ray negative for acute findings. Ace wrap applied for comfort. Recommended anti-inflammatories, ice and elevation at home. Can follow-up with PCP for any ongoing issues.  Patient discharged home in stable condition.  Final Clinical Impressions(s) / ED Diagnoses   Final diagnoses:  Right hand pain    New  Prescriptions Discharge Medication List as of 04/04/2017 12:15 PM    I personally performed the services described in this documentation, which was scribed in my presence. The recorded information has been reviewed and is accurate.    Larene Pickett, PA-C 04/04/17 1307    Tanna Furry, MD 04/05/17 1045

## 2017-04-04 NOTE — Discharge Instructions (Signed)
Can leave hand wrapped with Ace wrap for comfort. Take the prescribed medication as directed.  Can use ice/elevation as well to help. Follow-up with your primary care doctor. Return to the ED for new or worsening symptoms.

## 2017-12-27 ENCOUNTER — Telehealth: Payer: Self-pay | Admitting: Family Medicine

## 2017-12-27 NOTE — Telephone Encounter (Signed)
Copied from [place] [id number]. Topic: Quick Communication - Rx Refill/Question >> Dec 27, 2017 12:19 PM Oliver Pila B wrote: Medication: phentermine 37.5 MG capsule [[id number]] , topiramate (TOPAMAX) 50 MG tablet [[id number]]  Has the patient contacted their pharmacy? Yes.   (Agent: If no, request that the patient contact the pharmacy for the refill.) Preferred Pharmacy (with phone number or street name): [facility] [address] Agent: Please be advised that RX refills may take up to 3 business days. We ask that you follow-up with your pharmacy.

## 2017-12-27 NOTE — Telephone Encounter (Signed)
Patient called, left a detailed VM that she will need to schedule an appointment to scheduled and seen by Dr. Sarajane Jews before refills, she has not been seen in the office since 2017. Advised to call the office back to schedule the appointment.

## 2018-05-30 ENCOUNTER — Ambulatory Visit: Payer: Self-pay

## 2018-05-30 ENCOUNTER — Other Ambulatory Visit: Payer: Self-pay | Admitting: Occupational Medicine

## 2018-05-30 DIAGNOSIS — M79644 Pain in right finger(s): Secondary | ICD-10-CM

## 2018-05-30 IMAGING — DX DG FINGER THUMB 2+V*R*
3 series · 3 of 3 positions shown · non-contrast
Comparison: None.

CLINICAL DATA: Crush injury.  Pain.

EXAM:
RIGHT THUMB 2+V

[finger pa]
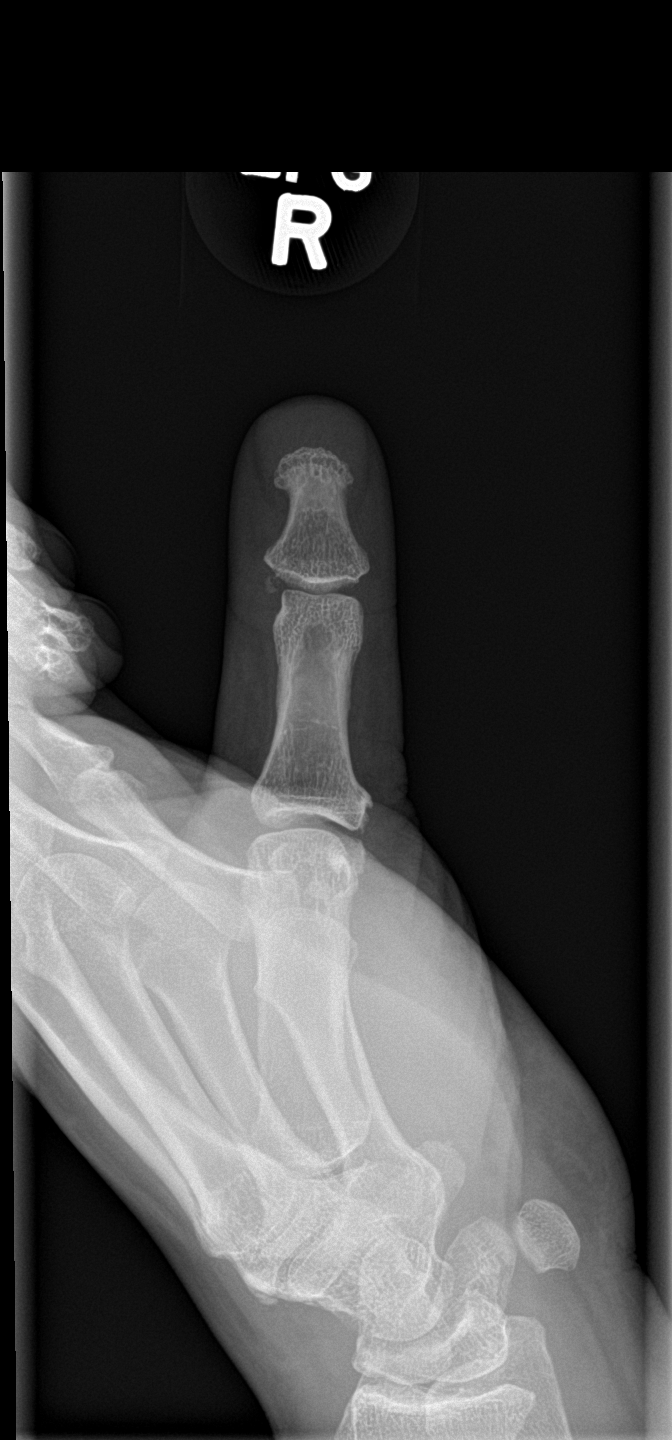

[finger obl]
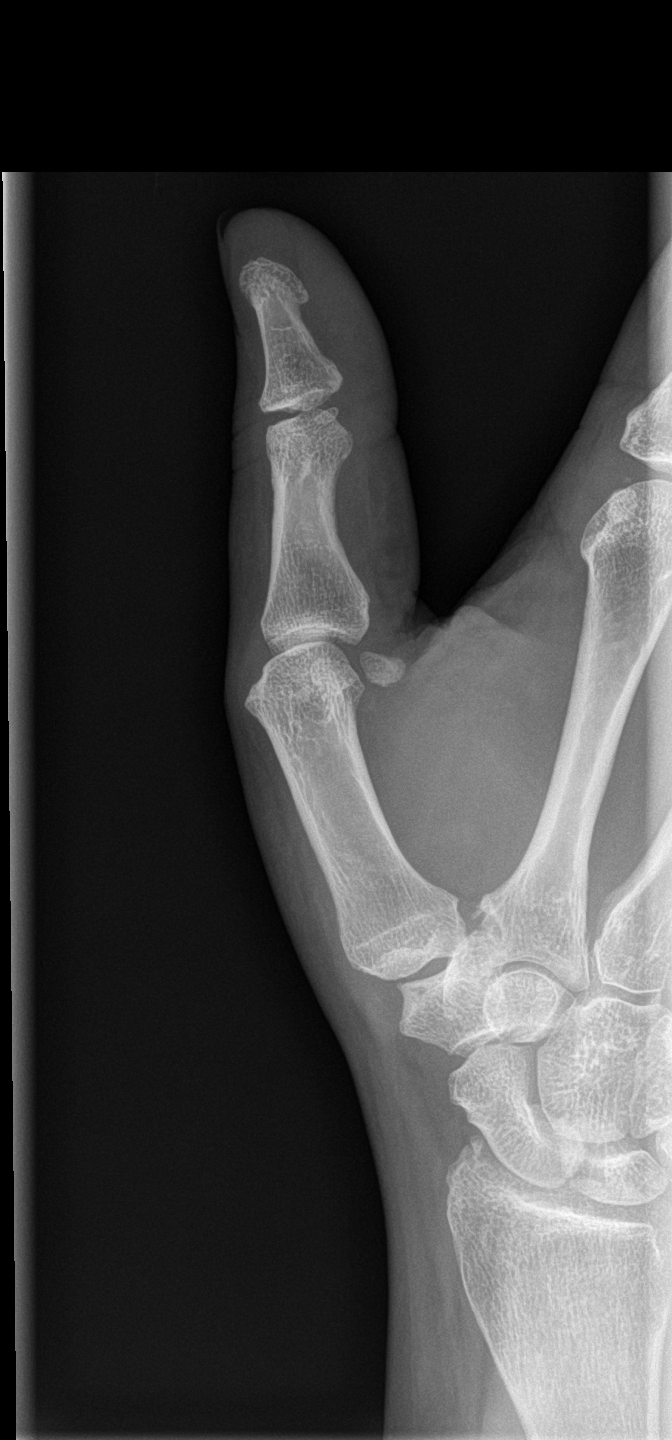

[finger lat]
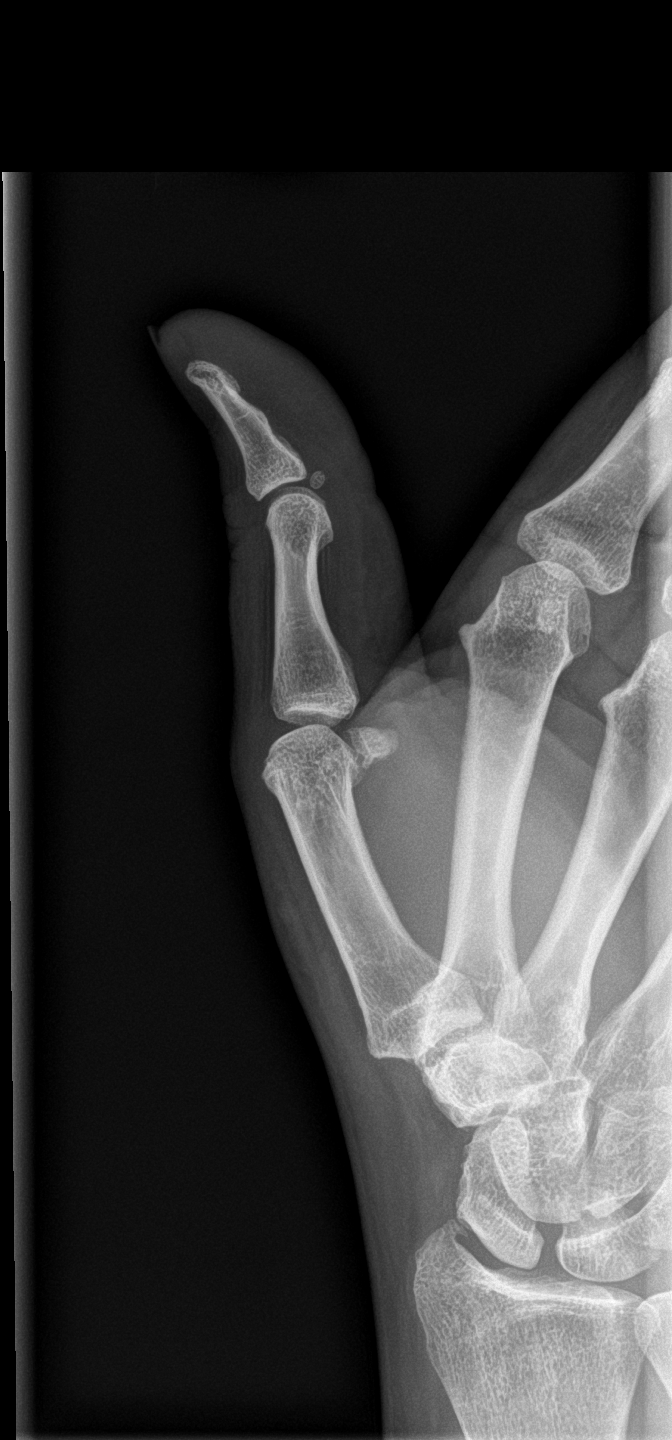

[3 of 3 positions shown; findings below may reference images not displayed]

FINDINGS: The proximal and distal phalanges appear normally aligned without
dislocation. There is a small chip fracture, avulsion fragment from
the base of the distal phalanx, at the interphalangeal joint, non
radial side, seen best only on the AP view.
IMPRESSION: Small chip fracture, interphalangeal joint of the thumb, appears to
arise from the base of the distal phalanx, non radial side.

## 2018-06-03 ENCOUNTER — Ambulatory Visit (INDEPENDENT_AMBULATORY_CARE_PROVIDER_SITE_OTHER): Payer: 59 | Admitting: Family Medicine

## 2018-06-03 ENCOUNTER — Encounter: Payer: Self-pay | Admitting: Family Medicine

## 2018-06-03 VITALS — BP 136/76 | HR 76 | Wt 211.0 lb

## 2018-06-03 DIAGNOSIS — M79644 Pain in right finger(s): Secondary | ICD-10-CM

## 2018-06-03 NOTE — Progress Notes (Signed)
Angela Hahn - 42 y.o. female MRN [id number]  Date of birth: 1976-02-03  SUBJECTIVE:  Including CC & ROS.  Chief Complaint  Patient presents with  . Hand Pain    Angela Hahn is a 42 y.o. female that is presenting with right hand pain. She broke her thumb on 05/29/18, she closed her hand on her thumb in door. She has been wearing a brace daily, applying ice and keeping her thumb elevated. She  has been taking Motrin with no improvement in her pain. Pain in constant burning sensation.   Independent review of the thumb x-ray from 10/3 shows a small chip fracture of the IP joint of the thumb.   Review of Systems  Constitutional: Negative for fever.  HENT: Negative for congestion.   Respiratory: Negative for cough.   Cardiovascular: Negative for chest pain.  Gastrointestinal: Negative for abdominal pain.  Musculoskeletal: Negative for gait problem.  Skin: Negative for color change.  Neurological: Negative for weakness.  Hematological: Negative for adenopathy.  Psychiatric/Behavioral: Negative for agitation.    HISTORY: Past Medical, Surgical, Social, and Family History Reviewed & Updated per EMR.   Pertinent Historical Findings include:  Past Medical History:  Diagnosis Date  . Anemia   . Chronic headaches   . Dysmenorrhea   . Heart murmur    dx'd since childhood    Past Surgical History:  Procedure Laterality Date  . TUBAL LIGATION      Allergies  Allergen Reactions  . Hydrocodone     itching  . Tramadol Other (See Comments)    headache    Family History  Problem Relation Age of Onset  . Arthritis Mother   . Depression Mother   . Hypertension Mother   . Diabetes Mother   . Hypertension Father   . Diabetes Father      Social History   Socioeconomic History  . Marital status: Married    Spouse name: Not on file  . Number of children: Not on file  . Years of education: Not on file  . Highest education level: Not on file  Occupational History  . Not  on file  Social Needs  . Financial resource strain: Not on file  . Food insecurity:    Worry: Not on file    Inability: Not on file  . Transportation needs:    Medical: Not on file    Non-medical: Not on file  Tobacco Use  . Smoking status: Former Smoker    Last attempt to quit: 05/28/2014    Years since quitting: 4.0  . Smokeless tobacco: Never Used  Substance and Sexual Activity  . Alcohol use: No    Alcohol/week: 0.0 standard drinks  . Drug use: No  . Sexual activity: Yes    Partners: Male    Birth control/protection: Surgical    Comment: Tubal  Lifestyle  . Physical activity:    Days per week: Not on file    Minutes per session: Not on file  . Stress: Not on file  Relationships  . Social connections:    Talks on phone: Not on file    Gets together: Not on file    Attends religious service: Not on file    Active member of club or organization: Not on file    Attends meetings of clubs or organizations: Not on file    Relationship status: Not on file  . Intimate partner violence:    Fear of current or ex partner: Not on file  Emotionally abused: Not on file    Physically abused: Not on file    Forced sexual activity: Not on file  Other Topics Concern  . Not on file  Social History Narrative  . Not on file     PHYSICAL EXAM:  VS: BP 136/76   Pulse 76   Wt 211 lb (95.7 kg)   LMP 05/18/2018   SpO2 98%   BMI 34.06 kg/m  Physical Exam Gen: NAD, alert, cooperative with exam, well-appearing ENT: normal lips, normal nasal mucosa,  Eye: normal EOM, normal conjunctiva and lids CV:  no edema, +2 pedal pulses   Resp: no accessory muscle use, non-labored,   Skin: no rashes, no areas of induration  Neuro: normal tone, normal sensation to touch Psych:  normal insight, alert and oriented MSK:  Right hand/thumb:  Normal MP flexion and extension  TTP along the IP joint of the thumb  Flexion and extension are intact of the IP joint  No pain with grind  Pain with  passive flexion and extension  No locking of the IP joint.  Neurovascularly intact       ASSESSMENT & PLAN:   Pain of right thumb Small chip fracture seen on xray. Had a trauma of smashing her hand in doorway. Normal function.  - try splint just for thumb for comfort. Would try to transition of this to avoid stiffness - may need PT - would xray or [place] at f/u

## 2018-06-03 NOTE — Patient Instructions (Signed)
Nice to meet you  Please try just a thumb brace to keep it straight.  Please follow up with me in one week and we'll see how the thumb is doing.  Please try tylenol and the rub on medicine for pain.

## 2018-06-04 DIAGNOSIS — M79644 Pain in right finger(s): Secondary | ICD-10-CM | POA: Insufficient documentation

## 2018-06-04 NOTE — Assessment & Plan Note (Signed)
Small chip fracture seen on xray. Had a trauma of smashing her hand in doorway. Normal function.  - try splint just for thumb for comfort. Would try to transition of this to avoid stiffness - may need PT - would xray or [place] at f/u

## 2018-06-11 ENCOUNTER — Encounter: Payer: Self-pay | Admitting: Family Medicine

## 2018-06-11 ENCOUNTER — Ambulatory Visit (INDEPENDENT_AMBULATORY_CARE_PROVIDER_SITE_OTHER): Payer: 59 | Admitting: Family Medicine

## 2018-06-11 VITALS — BP 140/84 | HR 85 | Temp 98.3°F | Wt 217.3 lb

## 2018-06-11 DIAGNOSIS — S62514G Nondisplaced fracture of proximal phalanx of right thumb, subsequent encounter for fracture with delayed healing: Secondary | ICD-10-CM | POA: Diagnosis not present

## 2018-06-11 MED ORDER — GABAPENTIN 100 MG PO CAPS: 100.0000 mg | ORAL_CAPSULE | Freq: Three times a day (TID) | ORAL | 5 refills | Status: DC

## 2018-06-11 MED ORDER — FERROUS SULFATE 325 (65 FE) MG PO TABS: 325.0000 mg | ORAL_TABLET | Freq: Two times a day (BID) | ORAL | 11 refills | Status: DC

## 2018-06-11 MED ORDER — IBUPROFEN 800 MG PO TABS
800.0000 mg | ORAL_TABLET | Freq: Three times a day (TID) | ORAL | 5 refills | Status: DC
Start: 2018-06-11 — End: 2018-10-04

## 2018-06-11 MED ORDER — PHENTERMINE HCL 37.5 MG PO CAPS: 37.5000 mg | ORAL_CAPSULE | Freq: Every morning | ORAL | 5 refills | Status: DC

## 2018-06-11 MED ORDER — TOPIRAMATE 50 MG PO TABS: 50.0000 mg | ORAL_TABLET | Freq: Every day | ORAL | 11 refills | Status: DC

## 2018-06-11 NOTE — Progress Notes (Signed)
   Subjective:    Patient ID: Angela Hahn, female    DOB: [birth year], 42 y.o.   MRN: [id number]  HPI Here for a Workers Comp injury to the right thumb that occurred on 05-29-18. While at work that day she shut a door on her thumb. She was sent to an Occupational Med clinic on 05-30-18 and Xrays revealed a small chip fracture off the proximal phalanx. She was placed in a splint and was told to ice it and take Ibuprofen. 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 She complains of intermittent severe burning pains in the thumb.    Review of Systems  Constitutional: Negative.   Respiratory: Negative.   Cardiovascular: Negative.   Musculoskeletal: Positive for arthralgias.  Neurological: Negative.        Objective:   Physical Exam  Constitutional: She appears well-developed and well-nourished.  Cardiovascular: Normal rate, regular rhythm, normal heart sounds and intact distal pulses.  Pulmonary/Chest: Effort normal and breath sounds normal.  Musculoskeletal:  The right thumb appears normal. She is quite tender at the first IP joint. ROM is limited by pain           Assessment & Plan:  Right thumb pain after a crush injury, likely due to some nerve damage. We will refer her to Hand Surgery to evaluate. She is written out of work for today.  Alysia Penna, MD

## 2018-06-21 ENCOUNTER — Ambulatory Visit (INDEPENDENT_AMBULATORY_CARE_PROVIDER_SITE_OTHER): Payer: Worker's Compensation | Admitting: Orthopaedic Surgery

## 2018-06-21 DIAGNOSIS — S62521A Displaced fracture of distal phalanx of right thumb, initial encounter for closed fracture: Secondary | ICD-10-CM

## 2018-06-21 MED ORDER — NAPROXEN 500 MG PO TABS: 500.0000 mg | ORAL_TABLET | Freq: Two times a day (BID) | ORAL | 3 refills | Status: DC

## 2018-06-21 MED ORDER — DICLOFENAC SODIUM 2 % TD SOLN: 2.0000 g | Freq: Two times a day (BID) | TRANSDERMAL | 3 refills | Status: DC | PRN

## 2018-06-30 NOTE — Progress Notes (Signed)
Office Visit Note   Patient: Angela Hahn           Date of Birth: [birth year]           MRN: [id number] Visit Date: 06/21/2018              Requested by: Laurey Morale, MD [place], [place] [address] PCP: Laurey Morale, MD   Assessment & Plan: Visit Diagnoses:  1. Displaced fracture of distal phalanx of right thumb, initial encounter for closed fracture     Plan: Impression is a small distal phalanx fracture of the thumb IP joint.  I gave her a prescription for hand therapy to help with regaining him function.  Prescription for pennsaid and naproxen.  Work note was provided for wearing a splint while typing.  Recheck in 6 weeks with 2 view x-rays of the right thumb.  Follow-Up Instructions: Return in about 6 weeks (around 08/02/2018).   Orders:  No orders of the defined types were placed in this encounter.  Meds ordered this encounter  Medications  . Diclofenac Sodium (PENNSAID) 2 % SOLN    Sig: Apply 2 g topically 2 (two) times daily as needed (to affected area).    Dispense:  112 g    Refill:  3  . naproxen (NAPROSYN) 500 MG tablet    Sig: Take 1 tablet (500 mg total) by mouth 2 (two) times daily with a meal.    Dispense:  60 tablet    Refill:  3      Procedures: No procedures performed   Clinical Data: No additional findings.   Subjective: Chief Complaint  Patient presents with  . Right Thumb - Pain, Injury    DOI 05/29/18 - door closed on thumb    Patient is a 42 year old female comes in with a work-related injury to her right thumb that happened on 05/29/2018.  Her right thumb was shut in a door.  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  She states that the pain only occurs with flexion of the thumb.  She has been using ice and ibuprofen and elevation.  Denies any numbness and tingling.   Review of Systems  Constitutional: Negative.   HENT: Negative.   Eyes: Negative.   Respiratory: Negative.     Cardiovascular: Negative.   Endocrine: Negative.   Musculoskeletal: Negative.   Neurological: Negative.   Hematological: Negative.   Psychiatric/Behavioral: Negative.   All other systems reviewed and are negative.    Objective: Vital Signs: There were no vitals taken for this visit.  Physical Exam  Constitutional: She is oriented to person, place, and time. She appears well-developed and well-nourished.  HENT:  Head: Normocephalic and atraumatic.  Eyes: EOM are normal.  Neck: Neck supple.  Pulmonary/Chest: Effort normal.  Abdominal: Soft.  Neurological: She is alert and oriented to person, place, and time.  Skin: Skin is warm. Capillary refill takes less than 2 seconds.  Psychiatric: She has a normal mood and affect. Her behavior is normal. Judgment and thought content normal.  Nursing note and vitals reviewed.   Ortho Exam Right thumb exam shows some moderate tenderness palpation of the ulnar aspect of the IP joint.  Collaterals are stable.  There is no swelling.  There is a minimal amount of stiffness of the IP joint. Specialty Comments:  No specialty comments available.  Imaging: No results found.   PMFS History: Patient Active Problem List  Diagnosis Date Noted  . Pain of right thumb 06/04/2018  . Concussion with loss of consciousness 09/24/2015  . Chronic low back pain 06/18/2012   Past Medical History:  Diagnosis Date  . Anemia   . Chronic headaches   . Dysmenorrhea   . Heart murmur    dx'd since childhood    Family History  Problem Relation Age of Onset  . Arthritis Mother   . Depression Mother   . Hypertension Mother   . Diabetes Mother   . Hypertension Father   . Diabetes Father     Past Surgical History:  Procedure Laterality Date  . TUBAL LIGATION     Social History   Occupational History  . Not on file  Tobacco Use  . Smoking status: Former Smoker    Last attempt to quit: 05/28/2014    Years since quitting: 4.0  . Smokeless  tobacco: Never Used  Substance and Sexual Activity  . Alcohol use: No    Alcohol/week: 0.0 standard drinks  . Drug use: No  . Sexual activity: Yes    Partners: Male    Birth control/protection: Surgical    Comment: Tubal

## 2018-08-09 ENCOUNTER — Encounter: Payer: Self-pay | Admitting: Family Medicine

## 2018-08-09 ENCOUNTER — Ambulatory Visit (INDEPENDENT_AMBULATORY_CARE_PROVIDER_SITE_OTHER): Payer: 59 | Admitting: Family Medicine

## 2018-08-09 VITALS — BP 124/82 | HR 91 | Temp 98.3°F | Wt 221.4 lb

## 2018-08-09 DIAGNOSIS — S62524G Nondisplaced fracture of distal phalanx of right thumb, subsequent encounter for fracture with delayed healing: Secondary | ICD-10-CM

## 2018-08-09 NOTE — Progress Notes (Signed)
   Subjective:    Patient ID: Angela Hahn, female    DOB: 1976/08/12, 42 y.o.   MRN: [id number]  HPI Here complaining of mild pain in the right thumb. She had a fracture to the thumb and she saw Dr. Frankey Shown in Orthopedics. He recommended she wear a splint and begin PT. However she says she never got the splint and never heard from PT. She has been working every day but favoring the hand. Several days ago it started having some pain again after not bothering her for weeks. She has taken nothing for this.    Review of Systems  Constitutional: Negative.   Respiratory: Negative.   Cardiovascular: Negative.   Musculoskeletal: Positive for arthralgias.       Objective:   Physical Exam Constitutional:      Appearance: Normal appearance.  Cardiovascular:     Rate and Rhythm: Normal rate and regular rhythm.     Pulses: Normal pulses.     Heart sounds: Normal heart sounds.  Pulmonary:     Effort: Pulmonary effort is normal.     Breath sounds: Normal breath sounds.  Musculoskeletal:     Comments: The right distal thumb is tender, beginning at the IP joint. No swelling, full ROM.   Neurological:     Mental Status: She is alert.           Assessment & Plan:  Thumb pain. She will rest the hand over the weekend. She can use Naproxen 500 mg bid as needed. She was to follow up with Dr. Erlinda Hong to get Xrays in 6 weeks, so I advised her to contact his office to set up this appt.  Alysia Penna, MD

## 2018-08-26 ENCOUNTER — Ambulatory Visit: Payer: Self-pay

## 2018-08-26 ENCOUNTER — Ambulatory Visit: Payer: Self-pay | Admitting: *Deleted

## 2018-08-26 NOTE — Telephone Encounter (Signed)
Patient returning call . Related to Patient that Workman's comp will have to be approved.  Patient states that All visits are approved.  Patient scheduled for appointment Tuesday 12/ 31/19 patient to arrive at 08:45am .  Appointment with  Dr.  Teresa Coombs.  Patient voiced understanding.  Provided address and directions.  Patient voiced understanding.

## 2018-08-26 NOTE — Telephone Encounter (Signed)
RN has not been able to reach pt to advise. LVM. Waiting on pt to return call.

## 2018-08-26 NOTE — Telephone Encounter (Signed)
Pt called with complaints of right thumb swelling and pain which started 08/23/18; elevation did help the swelling but not the pain; she states that she attempted to reach orthopedics without success; nurse triage initiated and recommendations made per protocol; the pt normally sees Dr Sarajane Jews, [facility], but there is no availability; attempted to contact [facility] but office closed until 1230; pt disconnected before information could be relayed; spoke with [place] at [facility] and she states that Dr Paulla Fore can see the pt but first the pt should verify that workman's comp will approve this visit; she does not the pt to incur a large co-payment; attempted to contact pt; left message on voicemail [phone]; no appointments offered or scheduled; notified Sheena at [facility] of events.       Reason for Disposition . [1] SEVERE pain AND [2] not improved 2 hours after pain medicine/ice packs . Finger joint can't be opened (straightened) or closed (bent) completely    (Note: injured person should be able to do this without assistance)  Answer Assessment - Initial Assessment Questions 1. ONSET: "When did the pain start?"     08/23/18 2. LOCATION: "Where is the pain located?"     Right thumb 3. PAIN: "How bad is the pain?" (Scale 1-10; or mild, moderate, severe)   - MILD (1-3): doesn't interfere with normal activities   - MODERATE (4-7): interferes with normal activities (e.g., work or school) or awakens from sleep   - SEVERE (8-10): excruciating pain, unable to use hand at all     Rated 9 out of 10 4. WORK OR EXERCISE: "Has there been any recent work or exercise that involved this part of the body?"     no 5. CAUSE: "What do you think is causing the pain?"     Broken in October 2019 6. AGGRAVATING FACTORS: "What makes the pain worse?" (e.g., using computer)     Not sure what makes it hurt the way it does 7. OTHER SYMPTOMS: "Do you have any other symptoms?" (e.g., neck pain, swelling,  rash, numbness, fever)     Swelling that  Is better with elevation 8. PREGNANCY: "Is there any chance you are pregnant?" "When was your last menstrual period?"     No LMP 08/25/18  Protocols used: FINGER INJURY-A-AH, HAND AND WRIST PAIN-A-AH

## 2018-08-27 ENCOUNTER — Ambulatory Visit: Payer: Self-pay | Admitting: Sports Medicine

## 2018-09-03 ENCOUNTER — Ambulatory Visit (INDEPENDENT_AMBULATORY_CARE_PROVIDER_SITE_OTHER): Payer: Worker's Compensation | Admitting: Orthopaedic Surgery

## 2018-09-03 ENCOUNTER — Encounter (INDEPENDENT_AMBULATORY_CARE_PROVIDER_SITE_OTHER): Payer: Self-pay | Admitting: Orthopaedic Surgery

## 2018-09-03 ENCOUNTER — Ambulatory Visit (INDEPENDENT_AMBULATORY_CARE_PROVIDER_SITE_OTHER): Payer: Worker's Compensation

## 2018-09-03 DIAGNOSIS — S62521A Displaced fracture of distal phalanx of right thumb, initial encounter for closed fracture: Secondary | ICD-10-CM

## 2018-09-03 DIAGNOSIS — M79644 Pain in right finger(s): Secondary | ICD-10-CM | POA: Diagnosis not present

## 2018-09-03 NOTE — Progress Notes (Signed)
Office Visit Note   Patient: Angela Hahn           Date of Birth: May 14, 1976           MRN: [id number] Visit Date: 09/03/2018              Requested by: Laurey Morale, MD [place], [place] [address] PCP: Laurey Morale, MD   Assessment & Plan: Visit Diagnoses:  1. Displaced fracture of distal phalanx of right thumb, initial encounter for closed fracture   2. Pain of right thumb     Plan: Impression is right thumb distal phalanx fracture that has healed.  The patient still exhibits stiffness and we would like for her to attend hand therapy for this.  We will write a new prescription today.  She will follow-up with [place] in 6 weeks time for final check.  Follow-Up Instructions: Return in about 6 weeks (around 10/15/2018).   Orders:  Orders Placed This Encounter  Procedures  . XR Finger Thumb Right  . Ambulatory referral to Physical Therapy   No orders of the defined types were placed in this encounter.     Procedures: No procedures performed   Clinical Data: No additional findings.   Subjective: Chief Complaint  Patient presents with  . Right Thumb - Follow-up    HPI patient is a pleasant 43 year old female who presents to our clinic today approximately 4 months out right distal phalanx fracture to the thumb.  Date of injury 10-19.  This is filed under [employer].  She was seen in our office on 06/21/2018 where she was to start in hand therapy.  She was never called for this.  She does note improved pain but notes sharp sensation at times.  Using Advil, ice and heat for this.  Review of Systems as detailed in HPI.  All others reviewed and are negative.   Objective: Vital Signs: There were no vitals taken for this visit.  Physical Exam well-developed and well-nourished female in no acute distress.  Alert and oriented x3.  Ortho Exam examination of her right thumb shows no tenderness.  No swelling.  She does have limited range of  motion secondary to stiffness.  Specialty Comments:  No specialty comments available.  Imaging: Xr Finger Thumb Right  Result Date: 09/03/2018 X-rays demonstrate a healed distal phalanx fracture with good bony consolidation    PMFS History: Patient Active Problem List   Diagnosis Date Noted  . Pain of right thumb 06/04/2018  . Concussion with loss of consciousness 09/24/2015  . Chronic low back pain 06/18/2012   Past Medical History:  Diagnosis Date  . Anemia   . Chronic headaches   . Dysmenorrhea   . Heart murmur    dx'd since childhood    Family History  Problem Relation Age of Onset  . Arthritis Mother   . Depression Mother   . Hypertension Mother   . Diabetes Mother   . Hypertension Father   . Diabetes Father     Past Surgical History:  Procedure Laterality Date  . TUBAL LIGATION     Social History   Occupational History  . Not on file  Tobacco Use  . Smoking status: Former Smoker    Last attempt to quit: 05/28/2014    Years since quitting: 4.2  . Smokeless tobacco: Never Used  Substance and Sexual Activity  . Alcohol use: No    Alcohol/week: 0.0 standard drinks  . Drug use: No  .  Sexual activity: Yes    Partners: Male    Birth control/protection: Surgical    Comment: Tubal

## 2018-09-30 ENCOUNTER — Ambulatory Visit (HOSPITAL_COMMUNITY)
Admission: EM | Admit: 2018-09-30 | Discharge: 2018-09-30 | Disposition: A | Discharge status: Home / Self Care | Payer: 59 | Attending: Family Medicine | Admitting: Family Medicine

## 2018-09-30 ENCOUNTER — Encounter (HOSPITAL_COMMUNITY): Payer: Self-pay

## 2018-09-30 ENCOUNTER — Other Ambulatory Visit: Payer: Self-pay

## 2018-09-30 DIAGNOSIS — R6889 Other general symptoms and signs: Secondary | ICD-10-CM | POA: Diagnosis present

## 2018-09-30 DIAGNOSIS — R05 Cough: Secondary | ICD-10-CM

## 2018-09-30 DIAGNOSIS — R509 Fever, unspecified: Secondary | ICD-10-CM

## 2018-09-30 DIAGNOSIS — R51 Headache: Secondary | ICD-10-CM

## 2018-09-30 DIAGNOSIS — M791 Myalgia, unspecified site: Secondary | ICD-10-CM

## 2018-09-30 MED ORDER — OSELTAMIVIR PHOSPHATE 75 MG PO CAPS: 75.0000 mg | ORAL_CAPSULE | Freq: Two times a day (BID) | ORAL | 0 refills | Status: DC

## 2018-09-30 MED ORDER — BENZONATATE 100 MG PO CAPS: 100.0000 mg | ORAL_CAPSULE | Freq: Three times a day (TID) | ORAL | 0 refills | Status: DC

## 2018-09-30 NOTE — ED Provider Notes (Signed)
[facility]    CSN: [id number] Arrival date & time: 09/30/18  1622     History   Chief Complaint Chief Complaint  Patient presents with  . Influenza    HPI Angela Hahn is a 43 y.o. female.   Patient has 2 to 3-day history of fever to 103 generalized myalgias cough and headache.  She did not take flu shot.  She is used several OTC medicines without much relief.  HPI  Past Medical History:  Diagnosis Date  . Anemia   . Chronic headaches   . Dysmenorrhea   . Heart murmur    dx'd since childhood    Patient Active Problem List   Diagnosis Date Noted  . Pain of right thumb 06/04/2018  . Concussion with loss of consciousness 09/24/2015  . Chronic low back pain 06/18/2012    Past Surgical History:  Procedure Laterality Date  . TUBAL LIGATION      OB History    Gravida  3   Para  3   Term  3   Preterm      AB      Living  3     SAB      TAB      Ectopic      Multiple      Live Births               Home Medications    Prior to Admission medications   Medication Sig Start Date End Date Taking? Authorizing Provider  betamethasone valerate (VALISONE) 0.1 % cream Apply twice a day as needed. 03/15/16   Laurey Morale, MD  Diclofenac Sodium (PENNSAID) 2 % SOLN Apply 2 g topically 2 (two) times daily as needed (to affected area). 06/21/18   Leandrew Koyanagi, MD  ferrous sulfate 325 (65 FE) MG tablet Take 1 tablet (325 mg total) by mouth 2 (two) times daily with a meal. 06/11/18   Laurey Morale, MD  gabapentin (NEURONTIN) 100 MG capsule Take 1 capsule (100 mg total) by mouth 3 (three) times daily. 06/11/18   Laurey Morale, MD  ibuprofen (ADVIL,MOTRIN) 800 MG tablet Take 1 tablet (800 mg total) by mouth 3 (three) times daily. 06/11/18   Laurey Morale, MD  naproxen (NAPROSYN) 500 MG tablet Take 1 tablet (500 mg total) by mouth 2 (two) times daily with a meal. 06/21/18   Leandrew Koyanagi, MD  phentermine 37.5 MG capsule Take 1 capsule (37.5 mg  total) by mouth every morning. 06/11/18   Laurey Morale, MD  topiramate (TOPAMAX) 50 MG tablet Take 1 tablet (50 mg total) by mouth daily. 06/11/18   Laurey Morale, MD  triamcinolone cream (KENALOG) 0.1 % APPLY TOPICALLY TWICE DAILY 09/06/16   Laurey Morale, MD    Family History Family History  Problem Relation Age of Onset  . Arthritis Mother   . Depression Mother   . Hypertension Mother   . Diabetes Mother   . Hypertension Father   . Diabetes Father     Social History Social History   Tobacco Use  . Smoking status: Former Smoker    Last attempt to quit: 05/28/2014    Years since quitting: 4.3  . Smokeless tobacco: Never Used  Substance Use Topics  . Alcohol use: No    Alcohol/week: 0.0 standard drinks  . Drug use: No     Allergies   Hydrocodone and Tramadol   Review of Systems Review of Systems  Constitutional: Positive for fever.  Respiratory: Positive for cough.   Musculoskeletal: Positive for myalgias.  Neurological: Positive for headaches.  All other systems reviewed and are negative.    Physical Exam Triage Vital Signs ED Triage Vitals  Enc Vitals Group     BP 09/30/18 1720 (!) 147/87     Pulse Rate 09/30/18 1720 100     Resp 09/30/18 1720 18     Temp 09/30/18 1720 99.6 F (37.6 C)     Temp src --      SpO2 09/30/18 1720 100 %     Weight 09/30/18 1724 188 lb (85.3 kg)     Height --      Head Circumference --      Peak Flow --      Pain Score 09/30/18 1724 10     Pain Loc --      Pain Edu? --      Excl. in [place]? --    No data found.  Updated Vital Signs BP (!) 147/87 (BP Location: Left Arm)   Pulse 100   Temp 99.6 F (37.6 C)   Resp 18   Wt 85.3 kg   LMP 09/25/2018   SpO2 100%   BMI 30.34 kg/m   Visual Acuity Right Eye Distance:   Left Eye Distance:   Bilateral Distance:    Right Eye Near:   Left Eye Near:    Bilateral Near:     Physical Exam Constitutional:      Appearance: Normal appearance. She is obese.  HENT:      Head: Normocephalic.     Right Ear: Tympanic membrane normal.     Left Ear: Tympanic membrane normal.     Nose: Nose normal.     Mouth/Throat:     Mouth: Mucous membranes are moist.     Pharynx: Oropharynx is clear.  Neck:     Musculoskeletal: Normal range of motion and neck supple.  Cardiovascular:     Rate and Rhythm: Normal rate and regular rhythm.  Pulmonary:     Effort: Pulmonary effort is normal.     Breath sounds: Normal breath sounds.  Abdominal:     Palpations: Abdomen is soft.  Neurological:     General: No focal deficit present.     Mental Status: She is alert and oriented to person, place, and time.      UC Treatments / Results  Labs (all labs ordered are listed, but only abnormal results are displayed) Labs Reviewed - No data to display  EKG None  Radiology No results found.  Procedures Procedures (including critical care time)  Medications Ordered in UC Medications - No data to display  Initial Impression / Assessment and Plan / UC Course  I have reviewed the triage vital signs and the nursing notes.  Pertinent labs & imaging results that were available during my care of the patient were reviewed by me and considered in my medical decision making (see chart for details).    Flulike illness.  Will offer Tamiflu and cough syrup with hydrocodone  Final Clinical Impressions(s) / UC Diagnoses   Final diagnoses:  None   Discharge Instructions   None    ED Prescriptions    None     Controlled Substance Prescriptions [place] Controlled Substance Registry consulted? Yes, I have consulted the [place] Controlled Substances Registry for this patient, and feel the risk/benefit ratio today is favorable for proceeding with this prescription for a controlled substance.   Alain Honey  M, MD 09/30/18 1746

## 2018-09-30 NOTE — ED Triage Notes (Signed)
Pt cc body aches , headaches,cough, congestion chills and fever. Pt has been using Mucinex and Tylenol nothing is working. Coughing up thick yellowish mucus.

## 2018-10-04 ENCOUNTER — Ambulatory Visit (INDEPENDENT_AMBULATORY_CARE_PROVIDER_SITE_OTHER): Payer: Self-pay | Admitting: Family Medicine

## 2018-10-04 ENCOUNTER — Encounter: Payer: Self-pay | Admitting: Family Medicine

## 2018-10-04 VITALS — BP 140/70 | HR 103 | Temp 98.0°F | Wt 215.3 lb

## 2018-10-04 DIAGNOSIS — R51 Headache: Secondary | ICD-10-CM

## 2018-10-04 DIAGNOSIS — R110 Nausea: Secondary | ICD-10-CM

## 2018-10-04 DIAGNOSIS — R519 Headache, unspecified: Secondary | ICD-10-CM

## 2018-10-04 DIAGNOSIS — R062 Wheezing: Secondary | ICD-10-CM

## 2018-10-04 MED ORDER — KETOROLAC TROMETHAMINE 60 MG/2ML IM SOLN
60.0000 mg | Freq: Once | INTRAMUSCULAR | Status: AC
  Administered 2018-10-04: 30 mg via INTRAMUSCULAR

## 2018-10-04 MED ORDER — KETOROLAC TROMETHAMINE 30 MG/ML IJ SOLN: 30.0000 mg | Freq: Once | INTRAMUSCULAR | Status: DC

## 2018-10-04 MED ORDER — ALBUTEROL SULFATE (2.5 MG/3ML) 0.083% IN NEBU
2.5000 mg | INHALATION_SOLUTION | Freq: Once | RESPIRATORY_TRACT | Status: AC
  Administered 2018-10-04: 2.5 mg via RESPIRATORY_TRACT

## 2018-10-04 MED ORDER — ONDANSETRON HCL 4 MG PO TABS: 4.0000 mg | ORAL_TABLET | Freq: Three times a day (TID) | ORAL | 0 refills | Status: DC | PRN

## 2018-10-04 MED ORDER — ONDANSETRON HCL 4 MG/5ML PO SOLN: 4.0000 mg | Freq: Once | ORAL | Status: DC

## 2018-10-04 MED ORDER — IBUPROFEN 800 MG PO TABS: 800.0000 mg | ORAL_TABLET | Freq: Three times a day (TID) | ORAL | 5 refills | Status: DC

## 2018-10-04 MED ORDER — ONDANSETRON HCL 4 MG/2ML IJ SOLN
4.0000 mg | INTRAMUSCULAR | Status: AC
  Administered 2018-10-04: 4 mg via INTRAMUSCULAR

## 2018-10-04 NOTE — Progress Notes (Signed)
[name] DOB: May 11, 1976 Encounter date: 10/04/2018  This is a 43 y.o. female who presents with Chief Complaint  Patient presents with  . Headache    diagnosed with the flu monday    History of present illness:  Was dx with flu on Monday.   Body feels better from where she was, but issue now is nausea with eating.   Getting nauseated with everything she tries to eat. No vomiting.   Head hurts in central forehead. Has been taking tylenol along with tamiflu and tessalon perrles. Tylenol not really helping with headache.     Allergies  Allergen Reactions  . Hydrocodone     itching  . Tramadol Other (See Comments)    headache   Current Meds  Medication Sig  . benzonatate (TESSALON) 100 MG capsule Take 1 capsule (100 mg total) by mouth every 8 (eight) hours.  . betamethasone valerate (VALISONE) 0.1 % cream Apply twice a day as needed.  . ferrous sulfate 325 (65 FE) MG tablet Take 1 tablet (325 mg total) by mouth 2 (two) times daily with a meal.  . ibuprofen (ADVIL,MOTRIN) 800 MG tablet Take 1 tablet (800 mg total) by mouth 3 (three) times daily.  Marland Kitchen oseltamivir (TAMIFLU) 75 MG capsule Take 1 capsule (75 mg total) by mouth every 12 (twelve) hours.  . phentermine 37.5 MG capsule Take 1 capsule (37.5 mg total) by mouth every morning.  . topiramate (TOPAMAX) 50 MG tablet Take 1 tablet (50 mg total) by mouth daily.  Marland Kitchen triamcinolone cream (KENALOG) 0.1 % APPLY TOPICALLY TWICE DAILY  . [DISCONTINUED] Diclofenac Sodium (PENNSAID) 2 % SOLN Apply 2 g topically 2 (two) times daily as needed (to affected area).  . [DISCONTINUED] gabapentin (NEURONTIN) 100 MG capsule Take 1 capsule (100 mg total) by mouth 3 (three) times daily.  . [DISCONTINUED] ibuprofen (ADVIL,MOTRIN) 800 MG tablet Take 1 tablet (800 mg total) by mouth 3 (three) times daily.  . [DISCONTINUED] naproxen (NAPROSYN) 500 MG tablet Take 1 tablet (500 mg total) by mouth 2 (two) times daily with a meal.    Review of Systems   Constitutional: Positive for fatigue and fever.  Respiratory: Positive for cough. Negative for shortness of breath and wheezing.   Gastrointestinal: Positive for diarrhea and nausea. Negative for vomiting (vomited in office).  Neurological: Positive for headaches.    Objective:  BP 140/70 (BP Location: Left Arm, Patient Position: Sitting, Cuff Size: Large)   Pulse (!) 103   Temp 98 F (36.7 C) (Oral)   Wt 215 lb 4.8 oz (97.7 kg)   LMP 09/25/2018   SpO2 98%   BMI 34.75 kg/m   Weight: 215 lb 4.8 oz (97.7 kg)   BP Readings from Last 3 Encounters:  10/04/18 140/70  09/30/18 (!) 147/87  08/09/18 124/82   Wt Readings from Last 3 Encounters:  10/04/18 215 lb 4.8 oz (97.7 kg)  09/30/18 188 lb (85.3 kg)  08/09/18 221 lb 6 oz (100.4 kg)    Physical Exam Constitutional:      General: She is not in acute distress.    Appearance: She is well-developed. She is not ill-appearing.  Neck:     Musculoskeletal: Neck supple.  Cardiovascular:     Rate and Rhythm: Normal rate and regular rhythm.     Heart sounds: Normal heart sounds. No murmur. No friction rub.  Pulmonary:     Effort: Pulmonary effort is normal. No respiratory distress.     Breath sounds: Wheezing present. No rales.  Comments: End exp wheezes anterior and lower lobes bilat. Concern for rales but after albuterol neb, lungs are clear to bases and wheezing has nearly resolved.  Abdominal:     General: Abdomen is flat. Bowel sounds are normal.     Palpations: Abdomen is soft.     Tenderness: There is no abdominal tenderness.  Musculoskeletal:     Right lower leg: No edema.     Left lower leg: No edema.  Lymphadenopathy:     Cervical: Cervical adenopathy present.  Neurological:     Mental Status: She is alert and oriented to person, place, and time.  Psychiatric:        Behavior: Behavior normal.     Assessment/Plan 1. Wheezing Improved after albuterol neb. Discussed monitoring breathing. Cough should continue  to improve, but may take a couple of weeks to fully resolve. Let us know if worsening or if not improving as expected. - PR INHAL RX, AIRWAY OBST/DX SPUTUM INDUCT - albuterol (PROVENTIL) (2.5 MG/3ML) 0.083% nebulizer solution 2.5 mg - ketorolac (TORADOL) injection 60 mg  2. Nonintractable headache, unspecified chronicity pattern, unspecified headache type Headache resolved in office with toradol. OK to use motrin at home if needed. Refilled for her. - ketorolac (TORADOL) injection 60 mg - ibuprofen (ADVIL,MOTRIN) 800 MG tablet; Take 1 tablet (800 mg total) by mouth 3 (three) times daily.  Dispense: 90 tablet; Refill: 5  3. Nausea zofran TID x 24 hrs then prn. Work on fluids, rest. Work note given through weekend. (patient vomited after starting albuterol neb (didn't even take a full breath. Had significant nausea; improved after zofran injection in office and able to tolerate albuterol neb) - ondansetron (ZOFRAN) injection 4 mg - ondansetron (ZOFRAN) 4 MG tablet; Take 1 tablet (4 mg total) by mouth every 8 (eight) hours as needed for nausea or vomiting.  Dispense: 30 tablet; Refill: 0    Return if symptoms worsen or fail to improve.  Micheline Rough, MD

## 2018-10-13 ENCOUNTER — Emergency Department (HOSPITAL_COMMUNITY): Payer: Medicaid Other

## 2018-10-13 ENCOUNTER — Emergency Department (HOSPITAL_COMMUNITY)
Admission: EM | Admit: 2018-10-13 | Discharge: 2018-10-13 | Disposition: A | Discharge status: Home / Self Care | Payer: Medicaid Other | Attending: Emergency Medicine | Admitting: Emergency Medicine

## 2018-10-13 ENCOUNTER — Encounter (HOSPITAL_COMMUNITY): Payer: Self-pay | Admitting: Emergency Medicine

## 2018-10-13 ENCOUNTER — Other Ambulatory Visit: Payer: Self-pay

## 2018-10-13 DIAGNOSIS — Z87891 Personal history of nicotine dependence: Secondary | ICD-10-CM | POA: Insufficient documentation

## 2018-10-13 DIAGNOSIS — B9789 Other viral agents as the cause of diseases classified elsewhere: Secondary | ICD-10-CM

## 2018-10-13 DIAGNOSIS — J069 Acute upper respiratory infection, unspecified: Secondary | ICD-10-CM

## 2018-10-13 DIAGNOSIS — Z79899 Other long term (current) drug therapy: Secondary | ICD-10-CM | POA: Insufficient documentation

## 2018-10-13 LAB — BASIC METABOLIC PANEL
ANION GAP: 5 (ref 5–15)
BUN: 10 mg/dL (ref 6–20)
CHLORIDE: 107 mmol/L (ref 98–111)
CO2: 28 mmol/L (ref 22–32)
Calcium: 9.1 mg/dL (ref 8.9–10.3)
Creatinine, Ser: 0.72 mg/dL (ref 0.44–1.00)
GFR calc non Af Amer: >60 mL/min (ref >60)
GLUCOSE: 126 mg/dL — AB (ref 70–99)
POTASSIUM: 4.3 mmol/L (ref 3.5–5.1)
Sodium: 140 mmol/L (ref 135–145)

## 2018-10-13 LAB — CBC WITH DIFFERENTIAL/PLATELET
ABS IMMATURE GRANULOCYTES: 0.02 10*3/uL (ref 0.00–0.07)
BASOS ABS: 0.1 10*3/uL (ref 0.0–0.1)
Basophils Relative: 1 %
Eosinophils Absolute: 0.2 10*3/uL (ref 0.0–0.5)
Eosinophils Relative: 3 %
HEMATOCRIT: 34.6 % — AB (ref 36.0–46.0)
Hemoglobin: 9.7 g/dL — ABNORMAL LOW (ref 12.0–15.0)
IMMATURE GRANULOCYTES: 0 %
LYMPHS ABS: 1.9 10*3/uL (ref 0.7–4.0)
LYMPHS PCT: 33 %
MCH: 20.6 pg — ABNORMAL LOW (ref 26.0–34.0)
MCHC: 28 g/dL — ABNORMAL LOW (ref 30.0–36.0)
MCV: 73.5 fL — ABNORMAL LOW (ref 80.0–100.0)
MONOS PCT: 14 %
Monocytes Absolute: 0.8 10*3/uL (ref 0.1–1.0)
NEUTROS ABS: 2.7 10*3/uL (ref 1.7–7.7)
NEUTROS PCT: 49 %
NRBC: 0 % (ref 0.0–0.2)
Platelets: 349 10*3/uL (ref 150–400)
RBC: 4.71 MIL/uL (ref 3.87–5.11)
RDW: 15.9 % — AB (ref 11.5–15.5)
WBC: 5.6 10*3/uL (ref 4.0–10.5)

## 2018-10-13 LAB — I-STAT BETA HCG BLOOD, ED (MC, WL, AP ONLY): I-stat hCG, quantitative: <5 m[IU]/mL (ref <5)

## 2018-10-13 IMAGING — CR DG CHEST 2V
2 series · 2 of 2 positions shown · non-contrast
Comparison: None.

CLINICAL DATA: Pt c/o generalized weakness and generalized body
aches x 14 days. Also c/o cough and congestion. H/o Heart murmur.
Former smoker. MOYFUL cough fever

EXAM:
CHEST - 2 VIEW

[w chest pa]
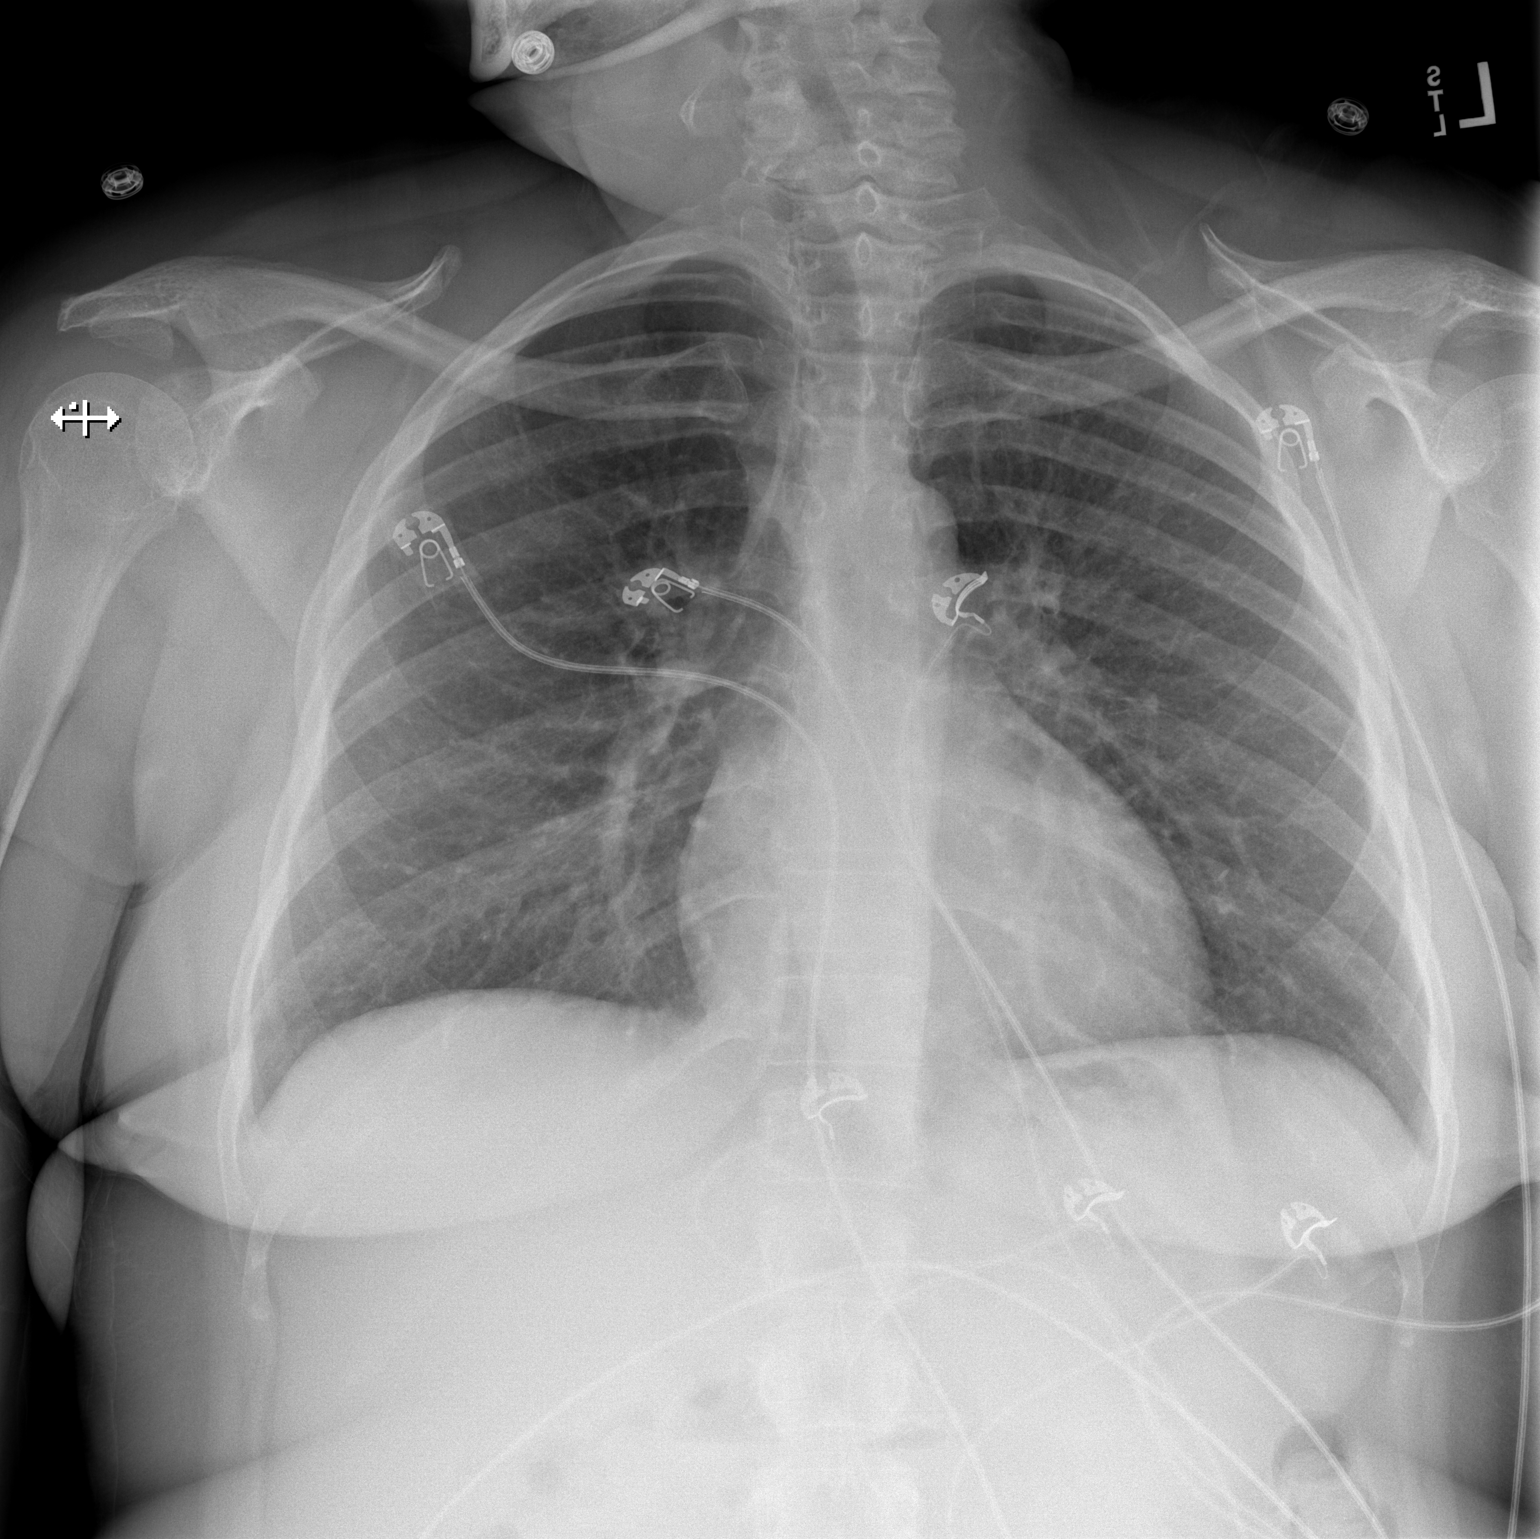

[w chest lat]
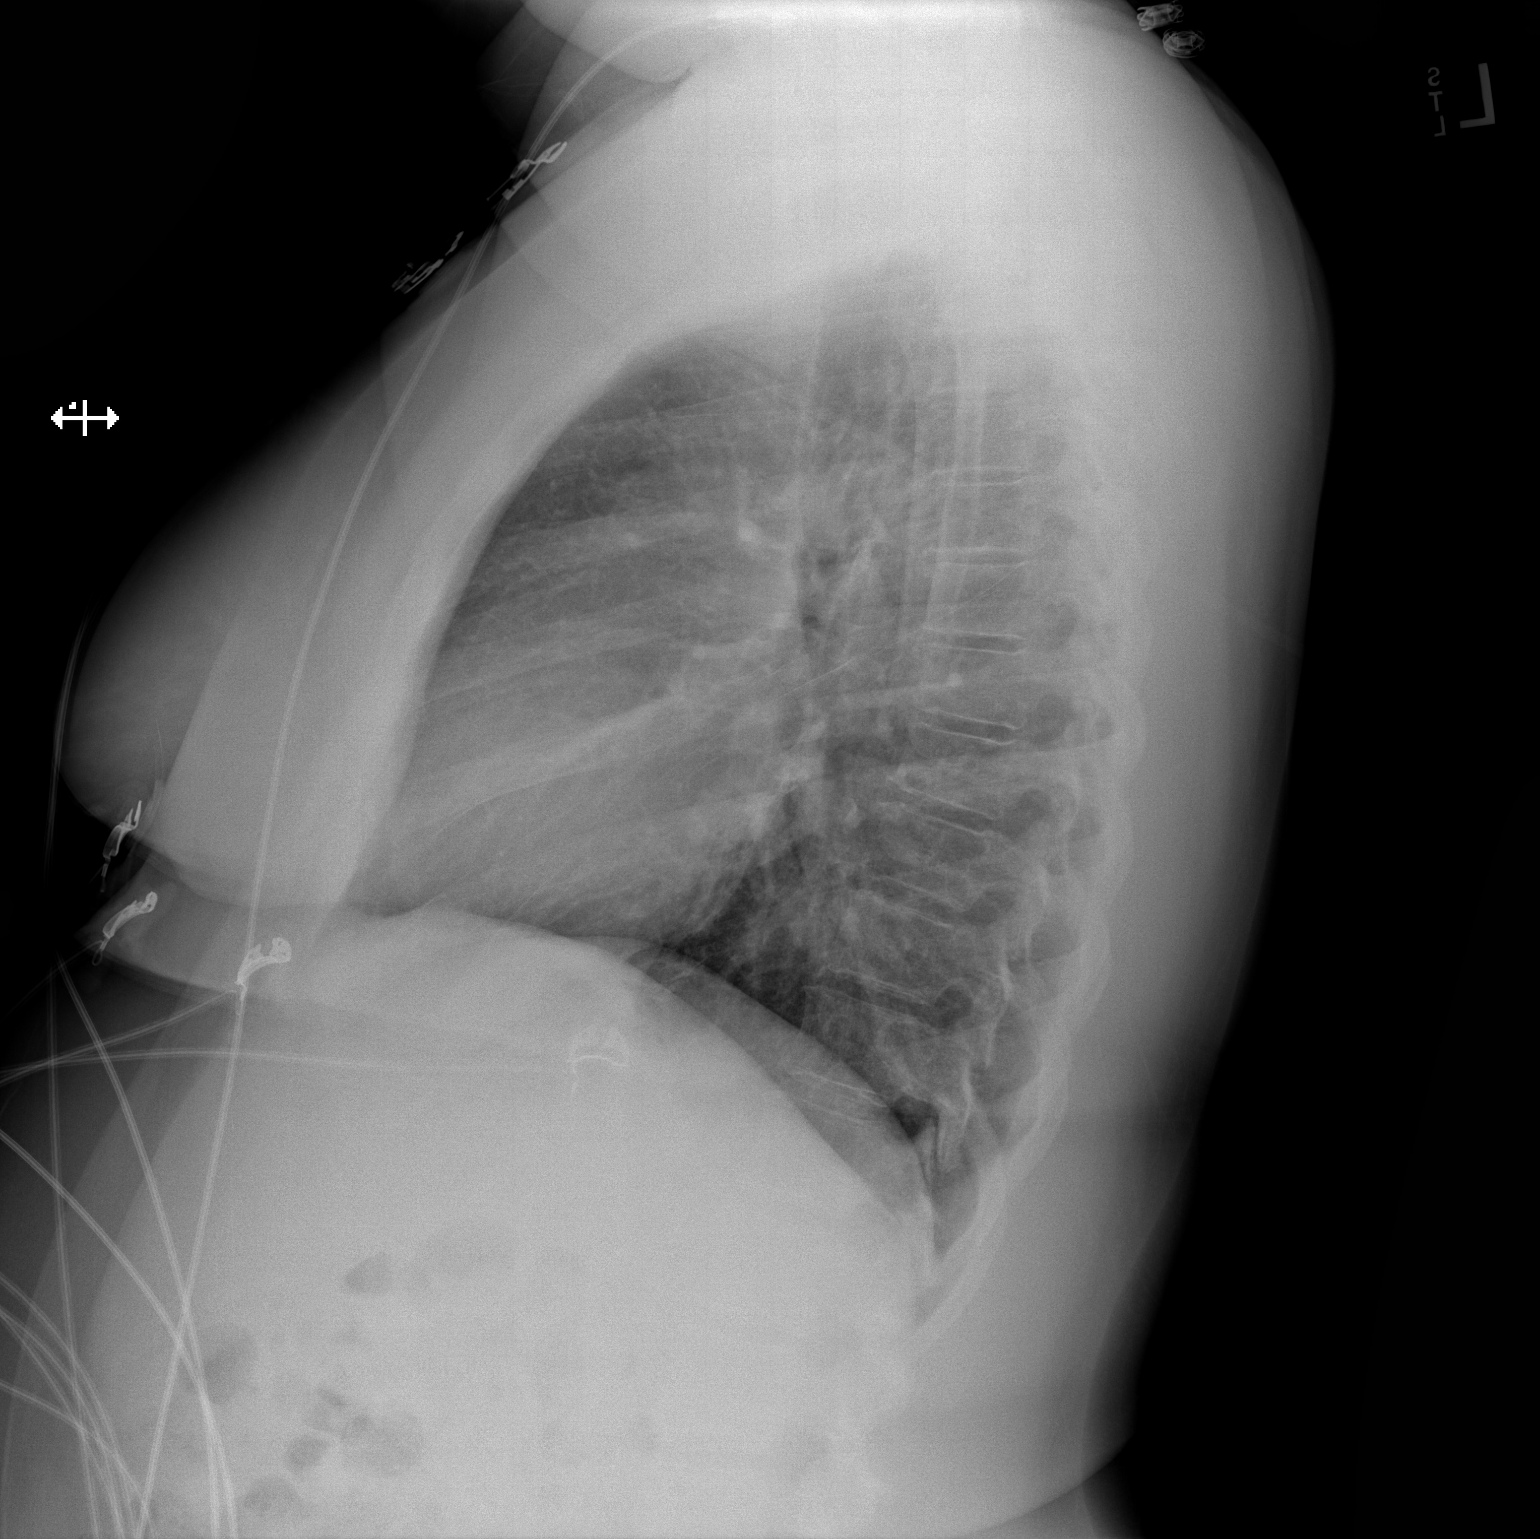

[2 of 2 positions shown; findings below may reference images not displayed]

FINDINGS: Normal mediastinum and cardiac silhouette. Normal pulmonary
vasculature. No evidence of effusion, infiltrate, or pneumothorax.
No acute bony abnormality.
IMPRESSION: No acute cardiopulmonary process.

## 2018-10-13 MED ORDER — KETOROLAC TROMETHAMINE 15 MG/ML IJ SOLN
30.0000 mg | Freq: Once | INTRAMUSCULAR | Status: AC
  Administered 2018-10-13: 30 mg via INTRAVENOUS
  Filled 2018-10-13: qty 2

## 2018-10-13 MED ORDER — SODIUM CHLORIDE 0.9 % IV BOLUS
1000.0000 mL | Freq: Once | INTRAVENOUS | Status: AC
  Administered 2018-10-13: 1000 mL via INTRAVENOUS

## 2018-10-13 MED ORDER — GUAIFENESIN 100 MG/5ML PO LIQD: 100.0000 mg | ORAL | 0 refills | Status: DC | PRN

## 2018-10-13 MED ORDER — PROCHLORPERAZINE EDISYLATE 10 MG/2ML IJ SOLN
10.0000 mg | Freq: Once | INTRAMUSCULAR | Status: DC
  Filled 2018-10-13: qty 2

## 2018-10-13 MED ORDER — DIPHENHYDRAMINE HCL 50 MG/ML IJ SOLN
25.0000 mg | Freq: Once | INTRAMUSCULAR | Status: DC
  Filled 2018-10-13: qty 1

## 2018-10-13 NOTE — Discharge Instructions (Signed)
You will need to increase your hydration. Continue with the medications prescribed by your primary care provider as needed for your symptoms. Return to the ED for worsening symptoms, chest pain, shortness of breath, vomiting or coughing up blood, leg swelling.

## 2018-10-13 NOTE — ED Triage Notes (Signed)
Pt c/o generalized weakness and generalized body aches x 14 days. Pt was evaluated in [facility] and given Tamiflu and Tessalon pearls. Pt also given Zofran. Pt states she is not experiencing any relief and continues with cough and weakness.

## 2018-10-13 NOTE — ED Provider Notes (Addendum)
[facility] DEPT Provider Note   CSN: [id number] Arrival date & time: 10/13/18  1252     History   Chief Complaint Chief Complaint  Patient presents with  . Cough  . Weakness    HPI Angela Hahn is a 43 y.o. female with a past medical history of anemia currently on iron who presents to ED for 2-week history of generalized body aches, nausea, decreased appetite.  She had similar symptoms plus a cough when she was seen and evaluated at the urgent care center on 09/30/2018.  She was then evaluated by her PCP several days later, given Toradol for headache, given albuterol and steroids to help with wheezing.  She had some improvement in her symptoms but then the cough recurred since several days ago.  She reports the cough is productive with green mucus.  She continues to have decreased appetite, intermittent headaches and fatigue.  Denies any recent international travel, sick contacts.  Denies chest pain, abdominal pain, fever, ongoing wheezing, head injuries or falls, vision changes.  HPI  Past Medical History:  Diagnosis Date  . Anemia   . Chronic headaches   . Dysmenorrhea   . Heart murmur    dx'd since childhood    Patient Active Problem List   Diagnosis Date Noted  . Flu-like symptoms 09/30/2018  . Pain of right thumb 06/04/2018  . Concussion with loss of consciousness 09/24/2015  . Chronic low back pain 06/18/2012    Past Surgical History:  Procedure Laterality Date  . TUBAL LIGATION       OB History    Gravida  3   Para  3   Term  3   Preterm      AB      Living  3     SAB      TAB      Ectopic      Multiple      Live Births               Home Medications    Prior to Admission medications   Medication Sig Start Date End Date Taking? Authorizing Provider  benzonatate (TESSALON) 100 MG capsule Take 1 capsule (100 mg total) by mouth every 8 (eight) hours. 09/30/18  Yes Wardell Honour, MD  ferrous sulfate 325  (65 FE) MG tablet Take 1 tablet (325 mg total) by mouth 2 (two) times daily with a meal. 06/11/18  Yes Laurey Morale, MD  betamethasone valerate (VALISONE) 0.1 % cream Apply twice a day as needed. Patient not taking: Reported on 10/13/2018 03/15/16   Laurey Morale, MD  guaiFENesin (ROBITUSSIN) 100 MG/5ML liquid Take 5-10 mLs (100-200 mg total) by mouth every 4 (four) hours as needed for cough. 10/13/18   Briggette Najarian, PA-C  ibuprofen (ADVIL,MOTRIN) 800 MG tablet Take 1 tablet (800 mg total) by mouth 3 (three) times daily. Patient not taking: Reported on 10/13/2018 10/04/18   Caren Macadam, MD  ondansetron (ZOFRAN) 4 MG tablet Take 1 tablet (4 mg total) by mouth every 8 (eight) hours as needed for nausea or vomiting. 10/04/18   Caren Macadam, MD  oseltamivir (TAMIFLU) 75 MG capsule Take 1 capsule (75 mg total) by mouth every 12 (twelve) hours. Patient not taking: Reported on 10/13/2018 09/30/18   Wardell Honour, MD  phentermine 37.5 MG capsule Take 1 capsule (37.5 mg total) by mouth every morning. Patient not taking: Reported on 10/13/2018 06/11/18   Laurey Morale,  MD  topiramate (TOPAMAX) 50 MG tablet Take 1 tablet (50 mg total) by mouth daily. Patient not taking: Reported on 10/13/2018 06/11/18   Laurey Morale, MD  triamcinolone cream (KENALOG) 0.1 % APPLY TOPICALLY TWICE DAILY Patient not taking: Reported on 10/13/2018 09/06/16   Laurey Morale, MD    Family History Family History  Problem Relation Age of Onset  . Arthritis Mother   . Depression Mother   . Hypertension Mother   . Diabetes Mother   . Hypertension Father   . Diabetes Father     Social History Social History   Tobacco Use  . Smoking status: Former Smoker    Last attempt to quit: 05/28/2014    Years since quitting: 4.3  . Smokeless tobacco: Never Used  Substance Use Topics  . Alcohol use: No    Alcohol/week: 0.0 standard drinks  . Drug use: No     Allergies   Hydrocodone and Tramadol   Review of  Systems Review of Systems  Constitutional: Positive for appetite change and chills. Negative for fever.  HENT: Negative for ear pain, rhinorrhea, sneezing and sore throat.   Eyes: Negative for photophobia and visual disturbance.  Respiratory: Positive for cough. Negative for chest tightness, shortness of breath and wheezing.   Cardiovascular: Negative for chest pain and palpitations.  Gastrointestinal: Positive for nausea. Negative for abdominal pain, blood in stool, constipation, diarrhea and vomiting.  Genitourinary: Negative for dysuria, hematuria and urgency.  Musculoskeletal: Positive for myalgias.  Skin: Negative for rash.  Neurological: Negative for dizziness, weakness and light-headedness.     Physical Exam Updated Vital Signs BP 135/80 (BP Location: Right Arm)   Pulse 92   Temp 98.9 F (37.2 C) (Oral)   Resp 16   Ht 5\' 6"  (1.676 m)   Wt 84.4 kg   LMP 09/25/2018   SpO2 100%   BMI 30.02 kg/m   Physical Exam Vitals signs and nursing note reviewed.  Constitutional:      General: She is not in acute distress.    Appearance: She is well-developed.  HENT:     Head: Normocephalic and atraumatic.     Right Ear: A middle ear effusion is present. There is no impacted cerumen.     Left Ear: A middle ear effusion is present.     Nose: Mucosal edema present.     Mouth/Throat:     Pharynx: Oropharynx is clear. Uvula midline.  Eyes:     General: No scleral icterus.       Right eye: No discharge.        Left eye: No discharge.     Conjunctiva/sclera: Conjunctivae normal.     Pupils: Pupils are equal, round, and reactive to light.  Neck:     Musculoskeletal: Normal range of motion and neck supple.  Cardiovascular:     Rate and Rhythm: Normal rate and regular rhythm.     Heart sounds: Normal heart sounds. No murmur. No friction rub. No gallop.   Pulmonary:     Effort: Pulmonary effort is normal. No respiratory distress.     Breath sounds: Normal breath sounds.  Abdominal:      General: Bowel sounds are normal. There is no distension.     Palpations: Abdomen is soft.     Tenderness: There is no abdominal tenderness. There is no guarding.  Musculoskeletal: Normal range of motion.  Skin:    General: Skin is warm and dry.     Findings: No rash.  Neurological:     General: No focal deficit present.     Mental Status: She is alert and oriented to person, place, and time.     Cranial Nerves: No cranial nerve deficit.     Sensory: No sensory deficit.     Motor: No weakness or abnormal muscle tone.     Coordination: Coordination normal.  Psychiatric:        Mood and Affect: Mood is not anxious.      ED Treatments / Results  Labs (all labs ordered are listed, but only abnormal results are displayed) Labs Reviewed  BASIC METABOLIC PANEL - Abnormal; Notable for the following components:      Result Value   Glucose, Bld 126 (*)    All other components within normal limits  CBC WITH DIFFERENTIAL/PLATELET - Abnormal; Notable for the following components:   Hemoglobin 9.7 (*)    HCT 34.6 (*)    MCV 73.5 (*)    MCH 20.6 (*)    MCHC 28.0 (*)    RDW 15.9 (*)    All other components within normal limits  I-STAT BETA HCG BLOOD, ED ([facility], [facility], [facility] ONLY)    EKG EKG Interpretation  Date/Time:  Sunday October 13 2018 13:06:03 EST Ventricular Rate:  98 PR Interval:    QRS Duration: 84 QT Interval:  345 QTC Calculation: 441 R Axis:   13 Text Interpretation:  Sinus rhythm Borderline T wave abnormalities since last tracing no significant change Confirmed by Wentz, Elliott ([id number]) on 10/13/2018 2:54:35 PM   Radiology Dg Chest 2 View  Result Date: 10/13/2018 CLINICAL DATA:  Pt c/o generalized weakness and generalized body aches x 14 days. Also c/o cough and congestion. H/o Heart murmur. Former smoker. prod cough fever EXAM: CHEST - 2 VIEW COMPARISON:  None. FINDINGS: Normal mediastinum and cardiac silhouette. Normal pulmonary vasculature. No evidence of effusion,  infiltrate, or pneumothorax. No acute bony abnormality. IMPRESSION: No acute cardiopulmonary process. Electronically Signed   By: Stewart  Edmunds M.D.   On: 10/13/2018 14:36    Procedures Procedures (including critical care time)  Medications Ordered in ED Medications  sodium chloride 0.9 % bolus 1,000 mL (0 mLs Intravenous Stopped 10/13/18 1500)  ketorolac (TORADOL) 15 MG/ML injection 30 mg (30 mg Intravenous Given 10/13/18 1529)     Initial Impression / Assessment and Plan / ED Course  I have reviewed the triage vital signs and the nursing notes.  Pertinent labs & imaging results that were available during my care of the patient were reviewed by me and considered in my medical decision making (see chart for details).     42  year old female presents to ED for 2-week history of generalized body aches, nausea and decreased appetite.  She had similar symptoms plus a cough when she was seen and evaluated at urgent care on 09/30/2018.  Evaluated by PCP several days later, given Toradol for headache and albuterol and steroids to help with wheezing.  She had some improvement but the cough is now productive with green mucus.  She still has decreased appetite, intermittent headaches and fatigue.  Denies chest pain or abdominal pain.  On my exam she is overall well-appearing.  She is ambulatory without difficulty. Lungs are clear to auscultation bilaterally.  Abdomen is soft, nontender nondistended.  Her vital signs are within normal limits.  She has a history of anemia, CBC shows hemoglobin of 9.7, which has been fluctuant in the past. She has had hgb of 9.7 several years ago which improved  with iron.  She has no active bleeding at this time and she is compliant with her p.o. iron. Her last hgb was 11 in 2017, and I am unable to see an more recent value. hCG is negative.  BMP unremarkable. Chest x-ray is unremarkable.  EKG shows no changes from prior tracings. Patient reports improvement in her symptoms  with medications given.  Suspect that the decreased appetite is in the setting of viral illness, and headache could be caused by the lack of appetite. There are no headache characteristics that are lateralizing or concerning for increased ICP, infectious or vascular cause of her symptoms. Will give additional antitussives, advised her to continue other medications given by PCP. Will advise her to return to ED for any severe or worsening symptoms.  Patient is hemodynamically stable, in NAD, and able to ambulate in the ED. Evaluation does not show pathology that would require ongoing emergent intervention or inpatient treatment. I explained the diagnosis to the patient. Pain has been managed and has no complaints prior to discharge. Patient is comfortable with above plan and is stable for discharge at this time. All questions were answered prior to disposition. Strict return precautions for returning to the ED were discussed. Encouraged follow up with PCP.    Portions of this note were generated with [employer]. Dictation errors may occur despite best attempts at proofreading.   Final Clinical Impressions(s) / ED Diagnoses   Final diagnoses:  Viral URI with cough    ED Discharge Orders         Ordered    guaiFENesin (ROBITUSSIN) 100 MG/5ML liquid  Every 4 hours PRN     10/13/18 1508             Delia Heady, PA-C 10/13/18 1544    Daleen Bo, MD 10/14/18 1441

## 2019-02-13 ENCOUNTER — Other Ambulatory Visit: Payer: Self-pay | Admitting: Family Medicine

## 2019-02-14 ENCOUNTER — Ambulatory Visit: Payer: Self-pay | Admitting: *Deleted

## 2019-02-14 NOTE — Telephone Encounter (Signed)
Patient thinks she may have food poisoning. Patient reports she has had nausea and vomiting after eating out- she thinks she had bad food. Patient did feel bad enough to go to Ed- but left because she did not want to sit there in pain and did not want to use the bathroom there. Advised patient UC because office is now closed and she agrees to go.  Reason for Disposition . [1] MODERATE pain (e.g., interferes with normal activities) AND [2] pain comes and goes (cramps) AND [3] present > 24 hours  (Exception: pain with Vomiting or Diarrhea - see that Guideline)  Answer Assessment - Initial Assessment Questions 1. LOCATION: "Where does it hurt?"      Above the bellybutton 2. RADIATION: "Does the pain shoot anywhere else?" (e.g., chest, back)     No radiation 3. ONSET: "When did the pain begin?" (e.g., minutes, hours or days ago)      Yesterday- 8:30 last night 4. SUDDEN: "Gradual or sudden onset?"     Sudden- cramping-nausea 5. PATTERN "Does the pain come and go, or is it constant?"    - If constant: "Is it getting better, staying the same, or worsening?"      (Note: Constant means the pain never goes away completely; most serious pain is constant and it progresses)     - If intermittent: "How long does it last?" "Do you have pain now?"     (Note: Intermittent means the pain goes away completely between bouts)     Comes and goes- lasting 10-15 minutes 6. SEVERITY: "How bad is the pain?"  (e.g., Scale 1-10; mild, moderate, or severe)   - MILD (1-3): doesn't interfere with normal activities, abdomen soft and not tender to touch    - MODERATE (4-7): interferes with normal activities or awakens from sleep, tender to touch    - SEVERE (8-10): excruciating pain, doubled over, unable to do any normal activities      Severe yesterday- today-7 7. RECURRENT SYMPTOM: "Have you ever had this type of abdominal pain before?" If so, ask: "When was the last time?" and "What happened that time?"      no 8.  CAUSE: "What do you think is causing the abdominal pain?"     Bad food 9. RELIEVING/AGGRAVATING FACTORS: "What makes it better or worse?" (e.g., movement, antacids, bowel movement)     No 10. OTHER SYMPTOMS: "Has there been any vomiting, diarrhea, constipation, or urine problems?"       Vomiting- 3 times in 24 hour- nausea-   Diarrhea- twice today 11. PREGNANCY: "Is there any chance you are pregnant?" "When was your last menstrual period?"       No- BTL  Protocols used: ABDOMINAL PAIN - [facility]

## 2019-02-15 ENCOUNTER — Encounter (HOSPITAL_COMMUNITY): Payer: Self-pay

## 2019-02-15 ENCOUNTER — Other Ambulatory Visit: Payer: Self-pay

## 2019-02-15 ENCOUNTER — Ambulatory Visit (HOSPITAL_COMMUNITY)
Admission: EM | Admit: 2019-02-15 | Discharge: 2019-02-15 | Disposition: A | Discharge status: Home / Self Care | Payer: Medicaid Other | Attending: Internal Medicine | Admitting: Internal Medicine

## 2019-02-15 DIAGNOSIS — R112 Nausea with vomiting, unspecified: Secondary | ICD-10-CM

## 2019-02-15 DIAGNOSIS — R197 Diarrhea, unspecified: Secondary | ICD-10-CM

## 2019-02-15 DIAGNOSIS — K529 Noninfective gastroenteritis and colitis, unspecified: Secondary | ICD-10-CM

## 2019-02-15 MED ORDER — DICYCLOMINE HCL 20 MG PO TABS: 20.0000 mg | ORAL_TABLET | Freq: Three times a day (TID) | ORAL | 0 refills | Status: DC

## 2019-02-15 MED ORDER — METOCLOPRAMIDE HCL 10 MG PO TABS: 10.0000 mg | ORAL_TABLET | Freq: Four times a day (QID) | ORAL | 0 refills | Status: DC

## 2019-02-15 MED ORDER — ONDANSETRON 4 MG PO TBDP
4.0000 mg | ORAL_TABLET | Freq: Once | ORAL | Status: AC
  Administered 2019-02-15: 4 mg via ORAL

## 2019-02-15 MED ORDER — ONDANSETRON 4 MG PO TBDP
ORAL_TABLET | ORAL | Status: AC
  Filled 2019-02-15: qty 1

## 2019-02-15 MED ORDER — FAMOTIDINE 40 MG PO TABS: 40.0000 mg | ORAL_TABLET | Freq: Two times a day (BID) | ORAL | 0 refills | Status: DC

## 2019-02-15 NOTE — Discharge Instructions (Addendum)
Please return if symptoms worsen or do not improve.

## 2019-02-15 NOTE — ED Provider Notes (Signed)
[facility]    CSN: [id number] Arrival date & time: 02/15/19  1107      History   Chief Complaint Chief Complaint  Patient presents with  . Diarrhea    HPI Angela Hahn is a 43 y.o. female.   HPI  Abdominal Pain: Patient complains of acute onset abdominal pain x 3 days ago. Went out to eat at [employer], [place]. Had breakfast which includes eggs and sausage patties. She calls one of sausage patties was slightly rare when she bit into the meet. A few hours after eating there, she developed cramping abdominal pain, diarrhea, nausea, and vomiting. Abdominal pain is mostly resolved. No diarrhea since yesterday. Most concerning symptoms at present is poor appetite and nausea. She has also missed two days of work including today and requires a work note. Denies fever, shortness of breath, cough, or headache. No known exposure to [place]. Past Medical History:  Diagnosis Date  . Anemia   . Chronic headaches   . Dysmenorrhea   . Heart murmur    dx'd since childhood    Patient Active Problem List   Diagnosis Date Noted  . Flu-like symptoms 09/30/2018  . Pain of right thumb 06/04/2018  . Concussion with loss of consciousness 09/24/2015  . Chronic low back pain 06/18/2012    Past Surgical History:  Procedure Laterality Date  . TUBAL LIGATION      OB History    Gravida  3   Para  3   Term  3   Preterm      AB      Living  3     SAB      TAB      Ectopic      Multiple      Live Births               Home Medications    Prior to Admission medications   Medication Sig Start Date End Date Taking? Authorizing Provider  benzonatate (TESSALON) 100 MG capsule Take 1 capsule (100 mg total) by mouth every 8 (eight) hours. 09/30/18   Wardell Honour, MD  betamethasone valerate (VALISONE) 0.1 % cream Apply twice a day as needed. Patient not taking: Reported on 10/13/2018 03/15/16   Laurey Morale, MD  ferrous sulfate 325 (65 FE) MG tablet Take 1  tablet (325 mg total) by mouth 2 (two) times daily with a meal. 06/11/18   Laurey Morale, MD  guaiFENesin (ROBITUSSIN) 100 MG/5ML liquid Take 5-10 mLs (100-200 mg total) by mouth every 4 (four) hours as needed for cough. 10/13/18   Khatri, Hina, PA-C  ibuprofen (ADVIL,MOTRIN) 800 MG tablet Take 1 tablet (800 mg total) by mouth 3 (three) times daily. Patient not taking: Reported on 10/13/2018 10/04/18   Caren Macadam, MD  ondansetron (ZOFRAN) 4 MG tablet Take 1 tablet (4 mg total) by mouth every 8 (eight) hours as needed for nausea or vomiting. 10/04/18   Caren Macadam, MD  oseltamivir (TAMIFLU) 75 MG capsule Take 1 capsule (75 mg total) by mouth every 12 (twelve) hours. Patient not taking: Reported on 10/13/2018 09/30/18   Wardell Honour, MD  phentermine 37.5 MG capsule Take 1 capsule (37.5 mg total) by mouth every morning. Patient not taking: Reported on 10/13/2018 06/11/18   Laurey Morale, MD  topiramate (TOPAMAX) 50 MG tablet Take 1 tablet (50 mg total) by mouth daily. Patient not taking: Reported on 10/13/2018 06/11/18   Laurey Morale,  MD  triamcinolone cream (KENALOG) 0.1 % APPLY TOPICALLY TWICE DAILY Patient not taking: Reported on 10/13/2018 09/06/16   Laurey Morale, MD    Family History Family History  Problem Relation Age of Onset  . Arthritis Mother   . Depression Mother   . Hypertension Mother   . Diabetes Mother   . Hypertension Father   . Diabetes Father     Social History Social History   Tobacco Use  . Smoking status: Former Smoker    Quit date: 05/28/2014    Years since quitting: 4.7  . Smokeless tobacco: Never Used  Substance Use Topics  . Alcohol use: No    Alcohol/week: 0.0 standard drinks  . Drug use: No     Allergies   Hydrocodone and Tramadol   Review of Systems Review of Systems Pertinent negatives listed in HPI Physical Exam Triage Vital Signs ED Triage Vitals  Enc Vitals Group     BP 02/15/19 1125 130/71     Pulse Rate 02/15/19 1125  (!) 106     Resp 02/15/19 1125 16     Temp 02/15/19 1125 98.8 F (37.1 C)     Temp src --      SpO2 02/15/19 1125 100 %     Weight 02/15/19 1123 190 lb (86.2 kg)     Height --      Head Circumference --      Peak Flow --      Pain Score 02/15/19 1123 6     Pain Loc --      Pain Edu? --      Excl. in [place]? --    No data found.  Updated Vital Signs BP 130/71 (BP Location: Right Arm)   Pulse (!) 106   Temp 98.8 F (37.1 C)   Resp 16   Wt 190 lb (86.2 kg)   LMP 02/02/2019   SpO2 100%   BMI 30.67 kg/m   Visual Acuity Right Eye Distance:   Left Eye Distance:   Bilateral Distance:    Right Eye Near:   Left Eye Near:    Bilateral Near:     Physical Exam General appearance: alert, well developed, well nourished, cooperative and in no distress Head: Normocephalic, without obvious abnormality, atraumatic Respiratory: Respirations even and unlabored, normal respiratory rate Heart: rate and rhythm normal. No gallop or murmurs noted on exam  Abdomen: BS +, no distention, no rebound tenderness, or no mass Extremities: No gross deformities Skin: Skin color, texture, turgor normal. No rashes seen  Psych: Appropriate mood and affect. Neurologic: Mental status: Alert, oriented to person, place, and time, thought content appropriate.  UC Treatments / Results  Labs (all labs ordered are listed, but only abnormal results are displayed) Labs Reviewed - No data to display  EKG None  Radiology No results found.  Procedures Procedures (including critical care time)  Medications Ordered in UC Medications  ondansetron (ZOFRAN-ODT) disintegrating tablet 4 mg (4 mg Oral Given 02/15/19 1132)  ondansetron (ZOFRAN-ODT) 4 MG disintegrating tablet (has no administration in time range)    Initial Impression / Assessment and Plan / UC Course  I have reviewed the triage vital signs and the nursing notes.  Pertinent labs & imaging results that were available during my care of the  patient were reviewed by me and considered in my medical decision making (see chart for details).    Patient presents today with improving symptoms associated with gastroenteritis. Uncertain if food poison was the source. Will  treat nausea with antiemetics, acid reducer, and antispasmodic as needed for symptom control. Encourage BRAT diet and advance food as tolerated. Work note provided. Red flags discussed.  Patient verbalized understanding and agreement with plan. Final Clinical Impressions(s) / UC Diagnoses   Final diagnoses:  Nausea vomiting and diarrhea  Gastroenteritis     Discharge Instructions     Please return if symptoms worsen or do not improve.    ED Prescriptions    Medication Sig Dispense Auth. Provider   metoCLOPramide (REGLAN) 10 MG tablet Take 1 tablet (10 mg total) by mouth every 6 (six) hours. 30 tablet Scot Jun, FNP   famotidine (PEPCID) 40 MG tablet Take 1 tablet (40 mg total) by mouth 2 (two) times daily for 10 days. 20 tablet Scot Jun, FNP   dicyclomine (BENTYL) 20 MG tablet Take 1 tablet (20 mg total) by mouth 3 (three) times daily before meals for 5 days. 15 tablet Scot Jun, FNP     Controlled Substance Prescriptions [place] Controlled Substance Registry consulted? No   Scot Jun, [place] 02/15/19 2315

## 2019-02-15 NOTE — Telephone Encounter (Signed)
Patient did go to ED.  

## 2019-02-15 NOTE — ED Triage Notes (Signed)
Pt states she has nausea and diarrhea. Pt states she went to a restaurant and  Ate then theses symptoms started. Pt thinks she has food poison.

## 2019-02-17 NOTE — Telephone Encounter (Signed)
Dr. Fry please advise on refill. Thanks  

## 2019-04-01 ENCOUNTER — Other Ambulatory Visit: Payer: Self-pay

## 2019-04-01 ENCOUNTER — Telehealth (INDEPENDENT_AMBULATORY_CARE_PROVIDER_SITE_OTHER): Payer: Medicaid Other | Admitting: Family Medicine

## 2019-04-01 DIAGNOSIS — G43009 Migraine without aura, not intractable, without status migrainosus: Secondary | ICD-10-CM

## 2019-04-01 DIAGNOSIS — N921 Excessive and frequent menstruation with irregular cycle: Secondary | ICD-10-CM

## 2019-04-01 MED ORDER — SUMATRIPTAN SUCCINATE 100 MG PO TABS: ORAL_TABLET | ORAL | 1 refills | Status: DC

## 2019-04-01 MED ORDER — PROPRANOLOL HCL ER 80 MG PO CP24: 80.0000 mg | ORAL_CAPSULE | Freq: Every day | ORAL | 1 refills | Status: DC

## 2019-04-01 NOTE — Progress Notes (Signed)
This visit type was conducted due to national recommendations for restrictions regarding the COVID-19 pandemic in an effort to limit this patient's exposure and mitigate transmission in our community.   Virtual Visit via Video Note  I connected with Angela Hahn on 04/01/19 at 11:30 AM EDT by a video enabled telemedicine application and verified that I am speaking with the correct person using two identifiers.  Location patient: home Location provider:work or home office Persons participating in the virtual visit: patient, provider  I discussed the limitations of evaluation and management by telemedicine and the availability of in person appointments. The patient expressed understanding and agreed to proceed.   HPI: Patient called to discuss the following items.  She has had heavy somewhat irregular menses particularly over the past year.  She states her periods are becoming more irregular and also more prolonged over time.  Current period has lasted about 7 days.  She is going through multiple pads per day.  She has occasional pelvic cramping.  She has not had any recent evaluation.  She is not on any form of contraception.  She states she has not been sexually active in several months.  She has history of anemia with hemoglobin 9.7 when she was in the ER several months ago.  She is taking iron regularly.  Denies any current pelvic pain.  No aspirin use.  No easy bleeding or bruising.  Second issue is frequent headaches.  These are usually left temporal area and are throbbing and can sometimes last several days.  She thinks these may be related to her cycle but there may be other triggers as well.  She has some photosensitivity and nausea and occasional vomiting.  No URI.  She took Topamax previously but is not sure if this helped.  She does not recall beta-blocker use previously.  She has taken ibuprofen for headaches without much improvement.  She does not recall other prophylactic medications  previously   ROS: See pertinent positives and negatives per HPI.  Past Medical History:  Diagnosis Date  . Anemia   . Chronic headaches   . Dysmenorrhea   . Heart murmur    dx'd since childhood    Past Surgical History:  Procedure Laterality Date  . TUBAL LIGATION      Family History  Problem Relation Age of Onset  . Arthritis Mother   . Depression Mother   . Hypertension Mother   . Diabetes Mother   . Hypertension Father   . Diabetes Father     SOCIAL HX: Quit smoking 2015   Current Outpatient Medications:  .  betamethasone valerate (VALISONE) 0.1 % cream, Apply twice a day as needed. (Patient not taking: Reported on 10/13/2018), Disp: 30 g, Rfl: 5 .  dicyclomine (BENTYL) 20 MG tablet, Take 1 tablet (20 mg total) by mouth 3 (three) times daily before meals for 5 days., Disp: 15 tablet, Rfl: 0 .  famotidine (PEPCID) 40 MG tablet, Take 1 tablet (40 mg total) by mouth 2 (two) times daily for 10 days., Disp: 20 tablet, Rfl: 0 .  ferrous sulfate 325 (65 FE) MG tablet, Take 1 tablet (325 mg total) by mouth 2 (two) times daily with a meal., Disp: 60 tablet, Rfl: 11 .  ibuprofen (ADVIL,MOTRIN) 800 MG tablet, Take 1 tablet (800 mg total) by mouth 3 (three) times daily. (Patient not taking: Reported on 10/13/2018), Disp: 90 tablet, Rfl: 5 .  metoCLOPramide (REGLAN) 10 MG tablet, Take 1 tablet (10 mg total) by mouth every  6 (six) hours., Disp: 30 tablet, Rfl: 0 .  ondansetron (ZOFRAN) 4 MG tablet, Take 1 tablet (4 mg total) by mouth every 8 (eight) hours as needed for nausea or vomiting., Disp: 30 tablet, Rfl: 0 .  phentermine 37.5 MG capsule, TAKE ONE CAPSULE BY MOUTH EVERY MORNING, Disp: 30 capsule, Rfl: 5 .  propranolol ER (INDERAL LA) 80 MG 24 hr capsule, Take 1 capsule (80 mg total) by mouth at bedtime., Disp: 30 capsule, Rfl: 1 .  SUMAtriptan (IMITREX) 100 MG tablet, Take one at onset of migraine and may repeat 1 in 2 hours.  Maximum of 2 in 24 hours., Disp: 10 tablet, Rfl: 1 .   triamcinolone cream (KENALOG) 0.1 %, APPLY TOPICALLY TWICE DAILY (Patient not taking: Reported on 10/13/2018), Disp: 45 g, Rfl: 2  EXAM:  VITALS per patient if applicable:  GENERAL: alert, oriented, appears well and in no acute distress  HEENT: atraumatic, conjunttiva clear, no obvious abnormalities on inspection of external nose and ears  NECK: normal movements of the head and neck  LUNGS: on inspection no signs of respiratory distress, breathing rate appears normal, no obvious gross SOB, gasping or wheezing  CV: no obvious cyanosis  MS: moves all visible extremities without noticeable abnormality  PSYCH/NEURO: pleasant and cooperative, no obvious depression or anxiety, speech and thought processing grossly intact  ASSESSMENT AND PLAN:  Discussed the following assessment and plan:  #1 menorrhagia with irregular cycle.  This has been progressive over several months.  Needs further evaluation.  We discussed possibly getting some labs and further work-up here but at this point have decided to go ahead with GYN referral and patient agrees.  Order has been placed.  Continue iron use in the meantime She is not on birth control but states that she has not been sexually active in several months  #2 migraine headaches without aura.  Patient states she is having sometimes up to 7/month.  -Discussed acute use of Imitrex 100 mg at onset of migraine and may repeat 1 in 2 hours but no more than 2 in 24 hours.  We emphasized importance of using these early with her migraines.  -Given frequency of headaches we also discussed prophylaxis.  Recommend trial of Inderal LA 80 mg once daily.  She does not have any contraindications such as asthma.  Reviewed potential side effects.  Strongly recommend follow-up with primary in about 3 weeks to reassess.  We also discussed potential triggers for migraine headaches  -Work note written from 03/31/2019 through 04/02/19   I discussed the assessment and treatment  plan with the patient. The patient was provided an opportunity to ask questions and all were answered. The patient agreed with the plan and demonstrated an understanding of the instructions.   The patient was advised to call back or seek an in-person evaluation if the symptoms worsen or if the condition fails to improve as anticipated.    Carolann Littler, MD

## 2019-04-07 ENCOUNTER — Other Ambulatory Visit: Payer: Self-pay

## 2019-04-08 ENCOUNTER — Ambulatory Visit: Payer: BLUE CROSS/BLUE SHIELD | Admitting: Gynecology

## 2019-04-08 DIAGNOSIS — Z0289 Encounter for other administrative examinations: Secondary | ICD-10-CM

## 2019-05-09 ENCOUNTER — Encounter: Payer: Self-pay | Admitting: Family Medicine

## 2019-05-09 ENCOUNTER — Other Ambulatory Visit: Payer: Self-pay

## 2019-05-09 ENCOUNTER — Telehealth (INDEPENDENT_AMBULATORY_CARE_PROVIDER_SITE_OTHER): Payer: Medicaid Other | Admitting: Family Medicine

## 2019-05-09 DIAGNOSIS — J209 Acute bronchitis, unspecified: Secondary | ICD-10-CM

## 2019-05-09 MED ORDER — AZITHROMYCIN 250 MG PO TABS: ORAL_TABLET | ORAL | 0 refills | Status: DC

## 2019-05-09 MED ORDER — HYDROCODONE-HOMATROPINE 5-1.5 MG/5ML PO SYRP: 5.0000 mL | ORAL_SOLUTION | ORAL | 0 refills | Status: DC | PRN

## 2019-05-09 NOTE — Progress Notes (Signed)
Virtual Visit via Video Note  I connected with the patient on 05/09/19 at 10:30 AM EDT by a video enabled telemedicine application and verified that I am speaking with the correct person using two identifiers.  Location patient: home Location provider:work or home office Persons participating in the virtual visit: patient, provider  I discussed the limitations of evaluation and management by telemedicine and the availability of in person appointments. The patient expressed understanding and agreed to proceed.   HPI: Here for 2 weeks of chest congestion and a dry cough. No fever or SOB or chest pain or body aches or NVD. Using Robitussin and NyQuil.    ROS: See pertinent positives and negatives per HPI.  Past Medical History:  Diagnosis Date  . Anemia   . Chronic headaches   . Dysmenorrhea   . Heart murmur    dx'd since childhood    Past Surgical History:  Procedure Laterality Date  . TUBAL LIGATION      Family History  Problem Relation Age of Onset  . Arthritis Mother   . Depression Mother   . Hypertension Mother   . Diabetes Mother   . Hypertension Father   . Diabetes Father      Current Outpatient Medications:  .  azithromycin (ZITHROMAX Z-PAK) 250 MG tablet, As directed, Disp: 6 each, Rfl: 0 .  betamethasone valerate (VALISONE) 0.1 % cream, Apply twice a day as needed. (Patient not taking: Reported on 10/13/2018), Disp: 30 g, Rfl: 5 .  dicyclomine (BENTYL) 20 MG tablet, Take 1 tablet (20 mg total) by mouth 3 (three) times daily before meals for 5 days., Disp: 15 tablet, Rfl: 0 .  famotidine (PEPCID) 40 MG tablet, Take 1 tablet (40 mg total) by mouth 2 (two) times daily for 10 days., Disp: 20 tablet, Rfl: 0 .  ferrous sulfate 325 (65 FE) MG tablet, Take 1 tablet (325 mg total) by mouth 2 (two) times daily with a meal., Disp: 60 tablet, Rfl: 11 .  HYDROcodone-homatropine (HYDROMET) 5-1.5 MG/5ML syrup, Take 5 mLs by mouth every 4 (four) hours as needed., Disp: 240 mL,  Rfl: 0 .  ibuprofen (ADVIL,MOTRIN) 800 MG tablet, Take 1 tablet (800 mg total) by mouth 3 (three) times daily. (Patient not taking: Reported on 10/13/2018), Disp: 90 tablet, Rfl: 5 .  metoCLOPramide (REGLAN) 10 MG tablet, Take 1 tablet (10 mg total) by mouth every 6 (six) hours., Disp: 30 tablet, Rfl: 0 .  ondansetron (ZOFRAN) 4 MG tablet, Take 1 tablet (4 mg total) by mouth every 8 (eight) hours as needed for nausea or vomiting., Disp: 30 tablet, Rfl: 0 .  phentermine 37.5 MG capsule, TAKE ONE CAPSULE BY MOUTH EVERY MORNING, Disp: 30 capsule, Rfl: 5 .  propranolol ER (INDERAL LA) 80 MG 24 hr capsule, Take 1 capsule (80 mg total) by mouth at bedtime., Disp: 30 capsule, Rfl: 1 .  SUMAtriptan (IMITREX) 100 MG tablet, Take one at onset of migraine and may repeat 1 in 2 hours.  Maximum of 2 in 24 hours., Disp: 10 tablet, Rfl: 1 .  triamcinolone cream (KENALOG) 0.1 %, APPLY TOPICALLY TWICE DAILY (Patient not taking: Reported on 10/13/2018), Disp: 45 g, Rfl: 2  EXAM:  VITALS per patient if applicable:  GENERAL: alert, oriented, appears well and in no acute distress  HEENT: atraumatic, conjunttiva clear, no obvious abnormalities on inspection of external nose and ears  NECK: normal movements of the head and neck  LUNGS: on inspection no signs of respiratory distress, breathing rate  appears normal, no obvious gross SOB, gasping or wheezing  CV: no obvious cyanosis  MS: moves all visible extremities without noticeable abnormality  PSYCH/NEURO: pleasant and cooperative, no obvious depression or anxiety, speech and thought processing grossly intact  ASSESSMENT AND PLAN: Bronchitis, treat with a Zpack.  Alysia Penna, MD  Discussed the following assessment and plan:  No diagnosis found.     I discussed the assessment and treatment plan with the patient. The patient was provided an opportunity to ask questions and all were answered. The patient agreed with the plan and demonstrated an  understanding of the instructions.   The patient was advised to call back or seek an in-person evaluation if the symptoms worsen or if the condition fails to improve as anticipated.

## 2019-05-20 ENCOUNTER — Encounter: Payer: Self-pay | Admitting: Gynecology

## 2019-06-20 ENCOUNTER — Other Ambulatory Visit: Payer: Self-pay

## 2019-06-20 ENCOUNTER — Encounter (HOSPITAL_COMMUNITY): Payer: Self-pay | Admitting: Emergency Medicine

## 2019-06-20 ENCOUNTER — Emergency Department (HOSPITAL_COMMUNITY)
Admission: EM | Admit: 2019-06-20 | Discharge: 2019-06-20 | Disposition: A | Discharge status: Home / Self Care | Payer: Medicaid Other | Attending: Emergency Medicine | Admitting: Emergency Medicine

## 2019-06-20 DIAGNOSIS — Z79899 Other long term (current) drug therapy: Secondary | ICD-10-CM | POA: Insufficient documentation

## 2019-06-20 DIAGNOSIS — M79606 Pain in leg, unspecified: Secondary | ICD-10-CM | POA: Insufficient documentation

## 2019-06-20 DIAGNOSIS — Z87891 Personal history of nicotine dependence: Secondary | ICD-10-CM | POA: Insufficient documentation

## 2019-06-20 DIAGNOSIS — N946 Dysmenorrhea, unspecified: Secondary | ICD-10-CM | POA: Insufficient documentation

## 2019-06-20 LAB — URINALYSIS, ROUTINE W REFLEX MICROSCOPIC
Bacteria, UA: NONE SEEN
Bilirubin Urine: NEGATIVE
Glucose, UA: NEGATIVE mg/dL
Ketones, ur: NEGATIVE mg/dL
Leukocytes,Ua: NEGATIVE
Nitrite: NEGATIVE
Protein, ur: NEGATIVE mg/dL
Specific Gravity, Urine: 1.02 (ref 1.005–1.030)
pH: 6 (ref 5.0–8.0)

## 2019-06-20 LAB — COMPREHENSIVE METABOLIC PANEL
ALT: 14 U/L (ref 0–44)
AST: 14 U/L — ABNORMAL LOW (ref 15–41)
Albumin: 4.1 g/dL (ref 3.5–5.0)
Alkaline Phosphatase: 50 U/L (ref 38–126)
Anion gap: 7 (ref 5–15)
BUN: 12 mg/dL (ref 6–20)
CO2: 24 mmol/L (ref 22–32)
Calcium: 8.9 mg/dL (ref 8.9–10.3)
Chloride: 108 mmol/L (ref 98–111)
Creatinine, Ser: 0.64 mg/dL (ref 0.44–1.00)
GFR calc Af Amer: >60 mL/min (ref >60)
GFR calc non Af Amer: >60 mL/min (ref >60)
Glucose, Bld: 108 mg/dL — ABNORMAL HIGH (ref 70–99)
Potassium: 3.6 mmol/L (ref 3.5–5.1)
Sodium: 139 mmol/L (ref 135–145)
Total Bilirubin: 0.3 mg/dL (ref 0.3–1.2)
Total Protein: 7.9 g/dL (ref 6.5–8.1)

## 2019-06-20 LAB — CBC WITH DIFFERENTIAL/PLATELET
Abs Immature Granulocytes: 0.04 10*3/uL (ref 0.00–0.07)
Basophils Absolute: 0.1 10*3/uL (ref 0.0–0.1)
Basophils Relative: 1 %
Eosinophils Absolute: 0.1 10*3/uL (ref 0.0–0.5)
Eosinophils Relative: 1 %
HCT: 35.6 % — ABNORMAL LOW (ref 36.0–46.0)
Hemoglobin: 10.5 g/dL — ABNORMAL LOW (ref 12.0–15.0)
Immature Granulocytes: 0 %
Lymphocytes Relative: 21 %
Lymphs Abs: 1.9 10*3/uL (ref 0.7–4.0)
MCH: 22.7 pg — ABNORMAL LOW (ref 26.0–34.0)
MCHC: 29.5 g/dL — ABNORMAL LOW (ref 30.0–36.0)
MCV: 76.9 fL — ABNORMAL LOW (ref 80.0–100.0)
Monocytes Absolute: 0.6 10*3/uL (ref 0.1–1.0)
Monocytes Relative: 7 %
Neutro Abs: 6.6 10*3/uL (ref 1.7–7.7)
Neutrophils Relative %: 70 %
Platelets: 293 10*3/uL (ref 150–400)
RBC: 4.63 MIL/uL (ref 3.87–5.11)
RDW: 15.3 % (ref 11.5–15.5)
WBC: 9.3 10*3/uL (ref 4.0–10.5)
nRBC: 0 % (ref 0.0–0.2)

## 2019-06-20 LAB — LIPASE, BLOOD: Lipase: 31 U/L (ref 11–51)

## 2019-06-20 LAB — WET PREP, GENITAL
Sperm: NONE SEEN
Trich, Wet Prep: NONE SEEN
Yeast Wet Prep HPF POC: NONE SEEN

## 2019-06-20 LAB — I-STAT BETA HCG BLOOD, ED (MC, WL, AP ONLY): I-stat hCG, quantitative: <5 m[IU]/mL (ref <5)

## 2019-06-20 MED ORDER — ETODOLAC 300 MG PO CAPS: 300.0000 mg | ORAL_CAPSULE | Freq: Three times a day (TID) | ORAL | 0 refills | Status: DC

## 2019-06-20 MED ORDER — SODIUM CHLORIDE 0.9 % IV SOLN: INTRAVENOUS | Status: DC

## 2019-06-20 MED ORDER — SODIUM CHLORIDE 0.9 % IV SOLN
1.0000 g | Freq: Once | INTRAVENOUS | Status: AC
  Administered 2019-06-20: 11:00:00 1 g via INTRAVENOUS
  Filled 2019-06-20: qty 10

## 2019-06-20 MED ORDER — ONDANSETRON HCL 4 MG/2ML IJ SOLN
4.0000 mg | Freq: Once | INTRAMUSCULAR | Status: AC
  Administered 2019-06-20: 4 mg via INTRAVENOUS
  Filled 2019-06-20: qty 2

## 2019-06-20 MED ORDER — KETOROLAC TROMETHAMINE 15 MG/ML IJ SOLN
15.0000 mg | Freq: Once | INTRAMUSCULAR | Status: AC
  Administered 2019-06-20: 08:00:00 15 mg via INTRAVENOUS
  Filled 2019-06-20: qty 1

## 2019-06-20 MED ORDER — SODIUM CHLORIDE 0.9 % IV BOLUS
1000.0000 mL | Freq: Once | INTRAVENOUS | Status: AC
  Administered 2019-06-20: 1000 mL via INTRAVENOUS

## 2019-06-20 MED ORDER — DOXYCYCLINE HYCLATE 100 MG PO TABS: 100.0000 mg | ORAL_TABLET | Freq: Two times a day (BID) | ORAL | 0 refills | Status: DC

## 2019-06-20 NOTE — ED Provider Notes (Signed)
[facility] DEPT Provider Note   CSN: [id number] Arrival date & time: 06/20/19  0732     History   Chief Complaint Chief Complaint  Patient presents with  . Abdominal Pain  . Leg Pain    HPI Angela Hahn is a 43 y.o. female.     HPI Pt is having pain in her abdomen for the last couple of days.  It started after she started her menses.  The pain is in her mid abdomen.  The pain goes to her lower back as well. This is typical for her. She has tried heating pads without relief.  She has been taking ibuprofen without relief.  No fevers.  No vomiting or diarrhea.     No new sexual partners.  No concern for STD.  Pt is also having leg cramping that is bilateral.  No swelling, no rashes.  No hx of DVT.  Past Medical History:  Diagnosis Date  . Anemia   . Chronic headaches   . Dysmenorrhea   . Heart murmur    dx'd since childhood    Patient Active Problem List   Diagnosis Date Noted  . Flu-like symptoms 09/30/2018  . Pain of right thumb 06/04/2018  . Concussion with loss of consciousness 09/24/2015  . Chronic low back pain 06/18/2012    Past Surgical History:  Procedure Laterality Date  . TUBAL LIGATION       OB History    Gravida  3   Para  3   Term  3   Preterm      AB      Living  3     SAB      TAB      Ectopic      Multiple      Live Births               Home Medications    Prior to Admission medications   Medication Sig Start Date End Date Taking? Authorizing Provider  ferrous sulfate 325 (65 FE) MG tablet Take 1 tablet (325 mg total) by mouth 2 (two) times daily with a meal. Patient taking differently: Take 325 mg by mouth daily with breakfast.  06/11/18  Yes Laurey Morale, MD  ibuprofen (ADVIL,MOTRIN) 800 MG tablet Take 1 tablet (800 mg total) by mouth 3 (three) times daily. 10/04/18  Yes Caren Macadam, MD  azithromycin (ZITHROMAX Z-PAK) 250 MG tablet As directed Patient not taking: Reported on  06/20/2019 05/09/19   Laurey Morale, MD  betamethasone valerate (VALISONE) 0.1 % cream Apply twice a day as needed. Patient not taking: Reported on 10/13/2018 03/15/16   Laurey Morale, MD  dicyclomine (BENTYL) 20 MG tablet Take 1 tablet (20 mg total) by mouth 3 (three) times daily before meals for 5 days. Patient not taking: Reported on 06/20/2019 02/15/19 02/20/19  Scot Jun, FNP  doxycycline (VIBRA-TABS) 100 MG tablet Take 1 tablet (100 mg total) by mouth 2 (two) times daily. 06/20/19   Dorie Rank, MD  etodolac (LODINE) 300 MG capsule Take 1 capsule (300 mg total) by mouth 3 (three) times daily. 06/20/19   Dorie Rank, MD  famotidine (PEPCID) 40 MG tablet Take 1 tablet (40 mg total) by mouth 2 (two) times daily for 10 days. Patient not taking: Reported on 06/20/2019 02/15/19 02/25/19  Scot Jun, FNP  HYDROcodone-homatropine (HYDROMET) 5-1.5 MG/5ML syrup Take 5 mLs by mouth every 4 (four) hours as needed. Patient not taking:  Reported on 06/20/2019 05/09/19   Laurey Morale, MD  metoCLOPramide (REGLAN) 10 MG tablet Take 1 tablet (10 mg total) by mouth every 6 (six) hours. Patient not taking: Reported on 06/20/2019 02/15/19   Scot Jun, FNP  ondansetron (ZOFRAN) 4 MG tablet Take 1 tablet (4 mg total) by mouth every 8 (eight) hours as needed for nausea or vomiting. Patient not taking: Reported on 06/20/2019 10/04/18   Caren Macadam, MD  phentermine 37.5 MG capsule TAKE ONE CAPSULE BY MOUTH EVERY MORNING Patient not taking: Reported on 06/20/2019 02/17/19   Laurey Morale, MD  propranolol ER (INDERAL LA) 80 MG 24 hr capsule Take 1 capsule (80 mg total) by mouth at bedtime. Patient not taking: Reported on 06/20/2019 04/01/19   Eulas Post, MD  SUMAtriptan (IMITREX) 100 MG tablet Take one at onset of migraine and may repeat 1 in 2 hours.  Maximum of 2 in 24 hours. Patient not taking: Reported on 06/20/2019 04/01/19   Eulas Post, MD  triamcinolone cream (KENALOG) 0.1 %  APPLY TOPICALLY TWICE DAILY Patient not taking: Reported on 10/13/2018 09/06/16   Laurey Morale, MD    Family History Family History  Problem Relation Age of Onset  . Arthritis Mother   . Depression Mother   . Hypertension Mother   . Diabetes Mother   . Hypertension Father   . Diabetes Father     Social History Social History   Tobacco Use  . Smoking status: Former Smoker    Quit date: 05/28/2014    Years since quitting: 5.0  . Smokeless tobacco: Never Used  Substance Use Topics  . Alcohol use: No    Alcohol/week: 0.0 standard drinks  . Drug use: No     Allergies   Hydrocodone and Tramadol   Review of Systems Review of Systems  Constitutional: Negative for fever.  Respiratory: Negative for cough.   All other systems reviewed and are negative.    Physical Exam Updated Vital Signs BP (!) 148/83 (BP Location: Right Arm)   Pulse 78   Temp 98.4 F (36.9 C) (Oral)   Resp 16   LMP 06/17/2019   SpO2 95%   Physical Exam Vitals signs and nursing note reviewed.  Constitutional:      General: She is not in acute distress.    Appearance: She is well-developed.  HENT:     Head: Normocephalic and atraumatic.     Right Ear: External ear normal.     Left Ear: External ear normal.  Eyes:     General: No scleral icterus.       Right eye: No discharge.        Left eye: No discharge.     Conjunctiva/sclera: Conjunctivae normal.  Neck:     Musculoskeletal: Neck supple.     Trachea: No tracheal deviation.  Cardiovascular:     Rate and Rhythm: Normal rate and regular rhythm.  Pulmonary:     Effort: Pulmonary effort is normal. No respiratory distress.     Breath sounds: Normal breath sounds. No stridor. No wheezing or rales.  Abdominal:     General: Bowel sounds are normal. There is no distension.     Palpations: Abdomen is soft.     Tenderness: There is abdominal tenderness ( mild) in the periumbilical area. There is no guarding or rebound.  Genitourinary:     Vagina: No vaginal discharge or tenderness.     Cervix: No cervical motion tenderness.  Uterus: Tender.      Adnexa:        Right: No mass.         Left: No mass.       Comments: Blood noted cervical os, no clots in vaginal vault Musculoskeletal:        General: No tenderness.  Skin:    General: Skin is warm and dry.     Findings: No rash.  Neurological:     Mental Status: She is alert.     Cranial Nerves: No cranial nerve deficit (no facial droop, extraocular movements intact, no slurred speech).     Sensory: No sensory deficit.     Motor: No abnormal muscle tone or seizure activity.     Coordination: Coordination normal.      ED Treatments / Results  Labs (all labs ordered are listed, but only abnormal results are displayed) Labs Reviewed  WET PREP, GENITAL - Abnormal; Notable for the following components:      Result Value   Clue Cells Wet Prep HPF POC PRESENT (*)    WBC, Wet Prep HPF POC MANY (*)    All other components within normal limits  COMPREHENSIVE METABOLIC PANEL - Abnormal; Notable for the following components:   Glucose, Bld 108 (*)    AST 14 (*)    All other components within normal limits  CBC WITH DIFFERENTIAL/PLATELET - Abnormal; Notable for the following components:   Hemoglobin 10.5 (*)    HCT 35.6 (*)    MCV 76.9 (*)    MCH 22.7 (*)    MCHC 29.5 (*)    All other components within normal limits  URINALYSIS, ROUTINE W REFLEX MICROSCOPIC - Abnormal; Notable for the following components:   Hgb urine dipstick MODERATE (*)    All other components within normal limits  LIPASE, BLOOD  I-STAT BETA HCG BLOOD, ED ([facility], [facility], [facility] ONLY)  GC/CHLAMYDIA PROBE AMP ([place]) NOT AT [facility]    EKG None  Radiology No results found.  Procedures Procedures (including critical care time)  Medications Ordered in ED Medications  sodium chloride 0.9 % bolus 1,000 mL (0 mLs Intravenous Stopped 06/20/19 0930)    And  0.9 %  sodium chloride infusion (has no  administration in time range)  cefTRIAXone (ROCEPHIN) 1 g in sodium chloride 0.9 % 100 mL IVPB (has no administration in time range)  ketorolac (TORADOL) 15 MG/ML injection 15 mg (15 mg Intravenous Given 06/20/19 0812)  ondansetron (ZOFRAN) injection 4 mg (4 mg Intravenous Given 06/20/19 [id number])     Initial Impression / Assessment and Plan / ED Course  I have reviewed the triage vital signs and the nursing notes.  Pertinent labs & imaging results that were available during my care of the patient were reviewed by me and considered in my medical decision making (see chart for details).  Clinical Course as of Jun 19 1117  Fri Jun 20, 2019  [id number] Electrolyte panel unremarkable.  CBC shows a stable anemia.  Similar to previous values.   [id number] Patient noted to have clue cells white blood cells but she is not having symptoms suggestive of bacterial vaginosis.   [JK]    Clinical Course User Index [JK] Dorie Rank, MD     Symptoms most likely related to dysmenorrhea.  Lower suspicion for PID.  I doubt tubo-ovarian abscess or ovarian torsion.  Patient has stable anemia.  No signs of significant bleeding at this time.  I will cover with antibiotics in  case there is a component of PID that she does have some increased clue cells..  Recommend outpatient follow-up with OB/GYN.  Final Clinical Impressions(s) / ED Diagnoses   Final diagnoses:  Dysmenorrhea    ED Discharge Orders         Ordered    doxycycline (VIBRA-TABS) 100 MG tablet  2 times daily     06/20/19 1117    etodolac (LODINE) 300 MG capsule  3 times daily    Note to Pharmacy: As needed for pain   06/20/19 1117           Dorie Rank, MD 06/20/19 1119

## 2019-06-20 NOTE — ED Triage Notes (Signed)
Pt reports when her menstrual cycle comes on, she has heavy flow and bad cramps the first couple days. Reports her abd pains and leg pains are worse this time for the past 3 days.

## 2019-06-20 NOTE — Discharge Instructions (Addendum)
Take the medications as prescribed, follow-up with an OB/GYN doctor for further evaluation

## 2019-06-24 LAB — GC/CHLAMYDIA PROBE AMP (~~LOC~~) NOT AT ARMC
Chlamydia: NEGATIVE
Neisseria Gonorrhea: NEGATIVE

## 2019-06-27 ENCOUNTER — Ambulatory Visit: Payer: Self-pay

## 2019-06-27 ENCOUNTER — Telehealth: Payer: Medicaid Other | Admitting: Family Medicine

## 2019-06-27 NOTE — Telephone Encounter (Signed)
Pt. Called to report 8 days of abdominal pain, and legs aching.  Stated she feels it is directly related to her menstrual cycle.  Seen in ER on 10/23 for same complaint.  Was advised to f/u as OP with OB/GYN.  Stated she does not have an OB/GYN doctor.  Reported being on menstrual cycle for 8 days, and is almost finished.   Stated she was having heavy bleeding and passing clots the 1st 5 days of the cycle.  Reported has gone through 2 packages of pads over the 8 days.  Reported the pain is all across the mid abdomen from side to side.  Rated pain at 7/10 at present time, and constant.  C/o back pain also.  Has been using heating pad on both abdomen and legs with some relief.  Reported she feels tired, but this is usual for her; denied weakness. Reported that her menstrual cycle comes every three weeks.  Encouraged to establish care with an OB/GYN doctor.  Pt. Verb. Understanding.  Called PCP Scheduler for an appt. Today; transferred pt. to the office to receive an appt.   Reason for Disposition . [1] MILD-MODERATE pain AND [2] constant AND [3] present > 2 hours  Answer Assessment - Initial Assessment Questions 1. LOCATION: "Where does it hurt?"      Mid abdominal pain all the way across 2. RADIATION: "Does the pain shoot anywhere else?" (e.g., chest, back)    Denied  3. ONSET: "When did the pain begin?" (e.g., minutes, hours or days ago)      About one week ago 4. SUDDEN: "Gradual or sudden onset?"     Cramping onset before the increased pain  5. PATTERN "Does the pain come and go, or is it constant?"    - If constant: "Is it getting better, staying the same, or worsening?"      (Note: Constant means the pain never goes away completely; most serious pain is constant and it progresses)     - If intermittent: "How long does it last?" "Do you have pain now?"     (Note: Intermittent means the pain goes away completely between bouts)     constant 6. SEVERITY: "How bad is the pain?"  (e.g., Scale  1-10; mild, moderate, or severe)   - MILD (1-3): doesn't interfere with normal activities, abdomen soft and not tender to touch    - MODERATE (4-7): interferes with normal activities or awakens from sleep, tender to touch    - SEVERE (8-10): excruciating pain, doubled over, unable to do any normal activities      Abdominal pain is 7/10 7. RECURRENT SYMPTOM: "Have you ever had this type of abdominal pain before?" If so, ask: "When was the last time?" and "What happened that time?"      Has had cramping like this similarly, but never this bad  8. CAUSE: "What do you think is causing the abdominal pain?"    Feels it is related to her menstrual cycle; going off now; was very heavy flow with clots in the early part of week.; every month her cycle comes early 9. RELIEVING/AGGRAVATING FACTORS: "What makes it better or worse?" (e.g., movement, antacids, bowel movement)     Using heating pad on abdomen and on thighs 10. OTHER SYMPTOMS: "Has there been any vomiting, diarrhea, constipation, or urine problems?"       Denied fever, chills, nausea, vomiting, constipation, or diarrhea.  Feels that she is not any more weak, but is fatigued.  Loss  of appetite past week; c/o legs aching  11. PREGNANCY: "Is there any chance you are pregnant?" "When was your last menstrual period?"       Finishing menstrual cycle now (on cycle past 8 days)  Protocols used: ABDOMINAL PAIN - [facility]

## 2019-07-05 NOTE — Telephone Encounter (Signed)
Pt cancelled her appointment on 06/27/2019 and rescheduled with Dr Sarajane Jews

## 2019-07-11 ENCOUNTER — Ambulatory Visit: Payer: Medicaid Other | Admitting: Family Medicine

## 2019-07-14 ENCOUNTER — Telehealth: Payer: Medicaid Other | Admitting: Family Medicine

## 2019-08-14 ENCOUNTER — Other Ambulatory Visit: Payer: Self-pay

## 2019-08-14 ENCOUNTER — Ambulatory Visit: Payer: Medicaid Other | Attending: Internal Medicine

## 2019-08-14 DIAGNOSIS — Z20822 Contact with and (suspected) exposure to covid-19: Secondary | ICD-10-CM

## 2019-08-16 LAB — NOVEL CORONAVIRUS, NAA: SARS-CoV-2, NAA: NOT DETECTED

## 2019-08-28 ENCOUNTER — Other Ambulatory Visit: Payer: Self-pay

## 2019-08-28 ENCOUNTER — Encounter (HOSPITAL_COMMUNITY): Payer: Self-pay

## 2019-08-28 ENCOUNTER — Ambulatory Visit (HOSPITAL_COMMUNITY)
Admission: EM | Admit: 2019-08-28 | Discharge: 2019-08-28 | Disposition: A | Discharge status: Home / Self Care | Payer: Medicaid Other | Attending: Internal Medicine | Admitting: Internal Medicine

## 2019-08-28 DIAGNOSIS — Z20828 Contact with and (suspected) exposure to other viral communicable diseases: Secondary | ICD-10-CM

## 2019-08-28 DIAGNOSIS — Z20822 Contact with and (suspected) exposure to covid-19: Secondary | ICD-10-CM

## 2019-08-28 DIAGNOSIS — J069 Acute upper respiratory infection, unspecified: Secondary | ICD-10-CM | POA: Diagnosis not present

## 2019-08-28 DIAGNOSIS — R112 Nausea with vomiting, unspecified: Secondary | ICD-10-CM

## 2019-08-28 MED ORDER — FLUTICASONE PROPIONATE 50 MCG/ACT NA SUSP: 1.0000 | Freq: Every day | NASAL | 0 refills | Status: DC

## 2019-08-28 MED ORDER — ONDANSETRON 4 MG PO TBDP: 4.0000 mg | ORAL_TABLET | Freq: Three times a day (TID) | ORAL | 0 refills | Status: DC | PRN

## 2019-08-28 MED ORDER — CETIRIZINE HCL 10 MG PO CAPS: 10.0000 mg | ORAL_CAPSULE | Freq: Every day | ORAL | 0 refills | Status: DC

## 2019-08-28 NOTE — ED Provider Notes (Signed)
[facility]    CSN: [id number] Arrival date & time: 08/28/19  1059      History   Chief Complaint Chief Complaint  Patient presents with  . Nasal Drainage  . Headache  . Vomiting    HPI Angela Hahn is a 43 y.o. female a significant past medical history presenting today for evaluation of headache, congestion and nausea and vomiting after Covid exposure.  Had Covid exposure on 12/19, developed symptoms 3 days later.  Has had a lot of nausea, occasional vomiting.  Denies loss of taste and smell.  Denies coughing.  Has had headaches.  Using Pepto-Bismol, Tums and ibuprofen.  Intermittent chest pain, denies currently.  Denies shortness of breath.  Son here with similar symptoms.  HPI  Past Medical History:  Diagnosis Date  . Anemia   . Chronic headaches   . Dysmenorrhea   . Heart murmur    dx'd since childhood    Patient Active Problem List   Diagnosis Date Noted  . Flu-like symptoms 09/30/2018  . Pain of right thumb 06/04/2018  . Concussion with loss of consciousness 09/24/2015  . Chronic low back pain 06/18/2012    Past Surgical History:  Procedure Laterality Date  . TUBAL LIGATION      OB History    Gravida  3   Para  3   Term  3   Preterm      AB      Living  3     SAB      TAB      Ectopic      Multiple      Live Births               Home Medications    Prior to Admission medications   Medication Sig Start Date End Date Taking? Authorizing Provider  betamethasone valerate (VALISONE) 0.1 % cream Apply twice a day as needed. Patient not taking: Reported on 10/13/2018 03/15/16   Laurey Morale, MD  Cetirizine HCl 10 MG CAPS Take 1 capsule (10 mg total) by mouth daily. 08/28/19   Sophiea Ueda C, PA-C  dicyclomine (BENTYL) 20 MG tablet Take 1 tablet (20 mg total) by mouth 3 (three) times daily before meals for 5 days. Patient not taking: Reported on 06/20/2019 02/15/19 02/20/19  Scot Jun, FNP  etodolac (LODINE) 300  MG capsule Take 1 capsule (300 mg total) by mouth 3 (three) times daily. 06/20/19   Dorie Rank, MD  famotidine (PEPCID) 40 MG tablet Take 1 tablet (40 mg total) by mouth 2 (two) times daily for 10 days. Patient not taking: Reported on 06/20/2019 02/15/19 02/25/19  Scot Jun, FNP  ferrous sulfate 325 (65 FE) MG tablet Take 1 tablet (325 mg total) by mouth 2 (two) times daily with a meal. Patient taking differently: Take 325 mg by mouth daily with breakfast.  06/11/18   Laurey Morale, MD  fluticasone (FLONASE) 50 MCG/ACT nasal spray Place 1-2 sprays into both nostrils daily for 7 days. 08/28/19 09/04/19  Markez Dowland C, PA-C  HYDROcodone-homatropine (HYDROMET) 5-1.5 MG/5ML syrup Take 5 mLs by mouth every 4 (four) hours as needed. Patient not taking: Reported on 06/20/2019 05/09/19   Laurey Morale, MD  ibuprofen (ADVIL,MOTRIN) 800 MG tablet Take 1 tablet (800 mg total) by mouth 3 (three) times daily. 10/04/18   Caren Macadam, MD  metoCLOPramide (REGLAN) 10 MG tablet Take 1 tablet (10 mg total) by mouth every 6 (six) hours.  Patient not taking: Reported on 06/20/2019 02/15/19   Scot Jun, FNP  ondansetron (ZOFRAN ODT) 4 MG disintegrating tablet Take 1 tablet (4 mg total) by mouth every 8 (eight) hours as needed for nausea or vomiting. 08/28/19   Courtlyn Aki C, PA-C  ondansetron (ZOFRAN) 4 MG tablet Take 1 tablet (4 mg total) by mouth every 8 (eight) hours as needed for nausea or vomiting. Patient not taking: Reported on 06/20/2019 10/04/18   Caren Macadam, MD  phentermine 37.5 MG capsule TAKE ONE CAPSULE BY MOUTH EVERY MORNING Patient not taking: Reported on 06/20/2019 02/17/19   Laurey Morale, MD  propranolol ER (INDERAL LA) 80 MG 24 hr capsule Take 1 capsule (80 mg total) by mouth at bedtime. Patient not taking: Reported on 06/20/2019 04/01/19   Eulas Post, MD  SUMAtriptan (IMITREX) 100 MG tablet Take one at onset of migraine and may repeat 1 in 2 hours.  Maximum of  2 in 24 hours. Patient not taking: Reported on 06/20/2019 04/01/19   Eulas Post, MD  triamcinolone cream (KENALOG) 0.1 % APPLY TOPICALLY TWICE DAILY Patient not taking: Reported on 10/13/2018 09/06/16   Laurey Morale, MD    Family History Family History  Problem Relation Age of Onset  . Arthritis Mother   . Depression Mother   . Hypertension Mother   . Diabetes Mother   . Hypertension Father   . Diabetes Father     Social History Social History   Tobacco Use  . Smoking status: Former Smoker    Quit date: 05/28/2014    Years since quitting: 5.2  . Smokeless tobacco: Never Used  Substance Use Topics  . Alcohol use: No    Alcohol/week: 0.0 standard drinks  . Drug use: No     Allergies   Hydrocodone and Tramadol   Review of Systems Review of Systems  Constitutional: Positive for appetite change. Negative for activity change, chills, fatigue and fever.  HENT: Positive for congestion, rhinorrhea and sore throat. Negative for ear pain, sinus pressure and trouble swallowing.   Eyes: Negative for discharge and redness.  Respiratory: Negative for cough, chest tightness and shortness of breath.   Cardiovascular: Negative for chest pain.  Gastrointestinal: Positive for nausea and vomiting. Negative for abdominal pain and diarrhea.  Musculoskeletal: Negative for myalgias.  Skin: Negative for rash.  Neurological: Negative for dizziness, light-headedness and headaches.     Physical Exam Triage Vital Signs ED Triage Vitals [08/28/19 1224]  Enc Vitals Group     BP 138/74     Pulse Rate 86     Resp 17     Temp 99 F (37.2 C)     Temp Source Oral     SpO2 100 %     Weight      Height      Head Circumference      Peak Flow      Pain Score      Pain Loc      Pain Edu?      Excl. in [place]?    No data found.  Updated Vital Signs BP 138/74 (BP Location: Left Arm)   Pulse 86   Temp 99 F (37.2 C) (Oral)   Resp 17   LMP 08/10/2019   SpO2 100%   Visual  Acuity Right Eye Distance:   Left Eye Distance:   Bilateral Distance:    Right Eye Near:   Left Eye Near:    Bilateral Near:  Physical Exam Vitals and nursing note reviewed.  Constitutional:      General: She is not in acute distress.    Appearance: She is well-developed.  HENT:     Head: Normocephalic and atraumatic.     Ears:     Comments: Bilateral ears without tenderness to palpation of external auricle, tragus and mastoid, EAC's without erythema or swelling, TM's with good bony landmarks and cone of light. Non erythematous.     Nose:     Comments: Nasal mucosa erythematous, swollen turbinates bilaterally    Mouth/Throat:     Comments: Oral mucosa pink and moist, no tonsillar enlargement or exudate. Posterior pharynx patent and nonerythematous, no uvula deviation or swelling. Normal phonation.  Eyes:     Conjunctiva/sclera: Conjunctivae normal.  Cardiovascular:     Rate and Rhythm: Normal rate and regular rhythm.     Heart sounds: No murmur.  Pulmonary:     Effort: Pulmonary effort is normal. No respiratory distress.     Breath sounds: Normal breath sounds.     Comments: Breathing comfortably at rest, CTABL, no wheezing, rales or other adventitious sounds auscultated Abdominal:     Palpations: Abdomen is soft.     Tenderness: There is abdominal tenderness.     Comments: Soft, nondistended, tender to palpation diffusely in upper abdomen, more prominent on left, and no focal tenderness, negative rebound, negative Rovsing, negative Murphy's, nontender to bilateral lower quadrants  Musculoskeletal:     Cervical back: Neck supple.  Skin:    General: Skin is warm and dry.  Neurological:     Mental Status: She is alert.      UC Treatments / Results  Labs (all labs ordered are listed, but only abnormal results are displayed) Labs Reviewed  NOVEL CORONAVIRUS, NAA (HOSP ORDER, SEND-OUT TO REF LAB; TAT 18-24 HRS)    EKG   Radiology No results  found.  Procedures Procedures (including critical care time)  Medications Ordered in UC Medications - No data to display  Initial Impression / Assessment and Plan / UC Course  I have reviewed the triage vital signs and the nursing notes.  Pertinent labs & imaging results that were available during my care of the patient were reviewed by me and considered in my medical decision making (see chart for details).     Covid swab pending, symptoms began shortly after Covid exposure, high suspicion of Covid Covid.  Recommending continued symptomatic and supportive care, do not suspect abdominal emergency at this time, continue to monitor pain.  Zofran as needed for nausea, push fluids.  Zyrtec and Flonase for congestion and sinus pressure that may be contributing to headaches as well as throat irritation.  Discussed strict return precautions. Patient verbalized understanding and is agreeable with plan.  Final Clinical Impressions(s) / UC Diagnoses   Final diagnoses:  Viral URI  Exposure to COVID-19 virus     Discharge Instructions     Covid swab pending, please continue to quarantine, monitor my chart for results If positive you will need to quarantine for a full 10 days from symptoms starting as well as until symptoms improving and fever free for 24 hours Continue Tylenol/ibuprofen for headaches and any body aches Begin daily cetirizine and Flonase nasal spray to help with sinus congestion/pressure and any drainage contributing to throat irritation Zofran as needed for nausea, dissolves underneath tongue Rest and push fluids  Follow-up if symptoms not improving or worsening, developing any difficulty breathing or increased pain   ED Prescriptions  Medication Sig Dispense Auth. Provider   Cetirizine HCl 10 MG CAPS Take 1 capsule (10 mg total) by mouth daily. 15 capsule Marek Nghiem C, PA-C   fluticasone (FLONASE) 50 MCG/ACT nasal spray Place 1-2 sprays into both nostrils daily  for 7 days. 1 g Laquida Cotrell C, PA-C   ondansetron (ZOFRAN ODT) 4 MG disintegrating tablet Take 1 tablet (4 mg total) by mouth every 8 (eight) hours as needed for nausea or vomiting. 20 tablet Lyfe Reihl, [place] C, PA-C     PDMP not reviewed this encounter.   Janith Lima, [place] 08/28/19 1305

## 2019-08-28 NOTE — Discharge Instructions (Addendum)
Covid swab pending, please continue to quarantine, monitor my chart for results If positive you will need to quarantine for a full 10 days from symptoms starting as well as until symptoms improving and fever free for 24 hours Continue Tylenol/ibuprofen for headaches and any body aches Begin daily cetirizine and Flonase nasal spray to help with sinus congestion/pressure and any drainage contributing to throat irritation Zofran as needed for nausea, dissolves underneath tongue Rest and push fluids  Follow-up if symptoms not improving or worsening, developing any difficulty breathing or increased pain

## 2019-08-28 NOTE — ED Triage Notes (Signed)
Pt presents with headache, nasal drainage, and vomiting X 1 week after a covid positive exposure.

## 2019-08-29 LAB — NOVEL CORONAVIRUS, NAA (HOSP ORDER, SEND-OUT TO REF LAB; TAT 18-24 HRS): SARS-CoV-2, NAA: NOT DETECTED

## 2019-09-02 ENCOUNTER — Other Ambulatory Visit: Payer: Self-pay | Admitting: Family Medicine

## 2019-10-02 ENCOUNTER — Ambulatory Visit (INDEPENDENT_AMBULATORY_CARE_PROVIDER_SITE_OTHER): Payer: Medicaid Other | Admitting: Family Medicine

## 2019-10-02 ENCOUNTER — Encounter: Payer: Self-pay | Admitting: Family Medicine

## 2019-10-02 ENCOUNTER — Other Ambulatory Visit: Payer: Self-pay

## 2019-10-02 VITALS — BP 120/64 | HR 88 | Temp 98.0°F | Wt 213.8 lb

## 2019-10-02 DIAGNOSIS — L03114 Cellulitis of left upper limb: Secondary | ICD-10-CM

## 2019-10-02 MED ORDER — DOXYCYCLINE HYCLATE 100 MG PO CAPS: 100.0000 mg | ORAL_CAPSULE | Freq: Two times a day (BID) | ORAL | 0 refills | Status: AC

## 2019-10-02 NOTE — Progress Notes (Signed)
   Subjective:    Patient ID: Angela Hahn, female    DOB: Mar 28, 1976, 44 y.o.   MRN: [id number]  HPI Here for an apparent bite on the left forearm. This appeared 2 days ago the morning after she had spent the night in a sleeping bag at a friend's house. The area is sore and it itches. No fevers.    Review of Systems  Constitutional: Negative.   Respiratory: Negative.   Cardiovascular: Negative.   Skin: Positive for wound.       Objective:   Physical Exam Constitutional:      Appearance: Normal appearance.  Cardiovascular:     Rate and Rhythm: Normal rate and regular rhythm.     Pulses: Normal pulses.     Heart sounds: Normal heart sounds.  Pulmonary:     Effort: Pulmonary effort is normal.     Breath sounds: Normal breath sounds.  Skin:    Comments: The left forearm has a small puncture wound surrounded by a 2 cm zone of erythema and warmth   Neurological:     Mental Status: She is alert.           Assessment & Plan:  Mild cellulitis from a likely spider bite. Treat with ice packs and Doxycycline. Alysia Penna, MD

## 2019-11-26 ENCOUNTER — Ambulatory Visit (HOSPITAL_COMMUNITY)
Admission: EM | Admit: 2019-11-26 | Discharge: 2019-11-26 | Disposition: A | Discharge status: Home / Self Care | Payer: Medicaid Other | Attending: Family Medicine | Admitting: Family Medicine

## 2019-11-26 ENCOUNTER — Encounter (HOSPITAL_COMMUNITY): Payer: Self-pay

## 2019-11-26 ENCOUNTER — Other Ambulatory Visit: Payer: Self-pay

## 2019-11-26 DIAGNOSIS — R519 Headache, unspecified: Secondary | ICD-10-CM | POA: Diagnosis present

## 2019-11-26 DIAGNOSIS — Z20822 Contact with and (suspected) exposure to covid-19: Secondary | ICD-10-CM

## 2019-11-26 DIAGNOSIS — R05 Cough: Secondary | ICD-10-CM | POA: Insufficient documentation

## 2019-11-26 DIAGNOSIS — R059 Cough, unspecified: Secondary | ICD-10-CM

## 2019-11-26 DIAGNOSIS — R0981 Nasal congestion: Secondary | ICD-10-CM

## 2019-11-26 NOTE — ED Triage Notes (Signed)
Pt reports having headaches, body aches, nasal congestion and cough x 5 days. Pt states she was exposed to COVID on the weekend.

## 2019-11-26 NOTE — Discharge Instructions (Signed)
Your COVID test is pending.  You should self quarantine until the test result is back.    Take Tylenol as needed for fever or discomfort.  Rest and keep yourself hydrated.    Go to the emergency department if you develop shortness of breath, severe diarrhea, high fever not relieved by Tylenol or ibuprofen, or other concerning symptoms.    

## 2019-11-26 NOTE — ED Provider Notes (Signed)
[facility]    CSN: [id number] Arrival date & time: 11/26/19  1635      History   Chief Complaint Chief Complaint  Patient presents with  . Covid Exposure  . Headache  . Cough  . Generalized Body Aches  . Nasal Congestion    HPI Angela Hahn is a 44 y.o. female.   Reports that she has had a positive exposure to Covid about a week ago.  Reports that she has since been having headache, cough, nasal congestion.  Denies sore throat, shortness of breath, wheezing, nausea, vomiting, diarrhea, rash, fever, other symptoms.  Reports that she was at her son's house and he called her about 2 days after she returned home telling her that he had a positive Covid test.  This was yesterday.  Per chart review, patient has medical history significant for heart murmur diagnosed as a child, headaches.  Has made no attempt to treat this at home.  ROS per HPI  The history is provided by the patient.    Past Medical History:  Diagnosis Date  . Anemia   . Chronic headaches   . Dysmenorrhea   . Heart murmur    dx'd since childhood    Patient Active Problem List   Diagnosis Date Noted  . Flu-like symptoms 09/30/2018  . Pain of right thumb 06/04/2018  . Concussion with loss of consciousness 09/24/2015  . Chronic low back pain 06/18/2012    Past Surgical History:  Procedure Laterality Date  . TUBAL LIGATION      OB History    Gravida  3   Para  3   Term  3   Preterm      AB      Living  3     SAB      TAB      Ectopic      Multiple      Live Births               Home Medications    Prior to Admission medications   Medication Sig Start Date End Date Taking? Authorizing Provider  betamethasone valerate (VALISONE) 0.1 % cream Apply twice a day as needed. 03/15/16   Laurey Morale, MD  Cetirizine HCl 10 MG CAPS Take 1 capsule (10 mg total) by mouth daily. 08/28/19   Wieters, Hallie C, PA-C  dicyclomine (BENTYL) 20 MG tablet Take 1 tablet (20 mg  total) by mouth 3 (three) times daily before meals for 5 days. Patient not taking: Reported on 06/20/2019 02/15/19 02/20/19  Scot Jun, FNP  etodolac (LODINE) 300 MG capsule Take 1 capsule (300 mg total) by mouth 3 (three) times daily. 06/20/19   Dorie Rank, MD  famotidine (PEPCID) 40 MG tablet TAKE 1 TABLET(40 MG) BY MOUTH TWICE DAILY FOR 10 DAYS 09/02/19   Scot Jun, FNP  ferrous sulfate 325 (65 FE) MG tablet Take 1 tablet (325 mg total) by mouth 2 (two) times daily with a meal. Patient taking differently: Take 325 mg by mouth daily with breakfast.  06/11/18   Laurey Morale, MD  fluticasone (FLONASE) 50 MCG/ACT nasal spray Place 1-2 sprays into both nostrils daily for 7 days. 08/28/19 09/04/19  Wieters, Hallie C, PA-C  ibuprofen (ADVIL,MOTRIN) 800 MG tablet Take 1 tablet (800 mg total) by mouth 3 (three) times daily. 10/04/18   Caren Macadam, MD  metoCLOPramide (REGLAN) 10 MG tablet Take 1 tablet (10 mg total) by mouth every 6 (  six) hours. 02/15/19   Scot Jun, FNP  ondansetron (ZOFRAN ODT) 4 MG disintegrating tablet Take 1 tablet (4 mg total) by mouth every 8 (eight) hours as needed for nausea or vomiting. 08/28/19   Wieters, Hallie C, PA-C  ondansetron (ZOFRAN) 4 MG tablet Take 1 tablet (4 mg total) by mouth every 8 (eight) hours as needed for nausea or vomiting. 10/04/18   Caren Macadam, MD  phentermine 37.5 MG capsule TAKE ONE CAPSULE BY MOUTH EVERY MORNING 02/17/19   Laurey Morale, MD  propranolol ER (INDERAL LA) 80 MG 24 hr capsule Take 1 capsule (80 mg total) by mouth at bedtime. 04/01/19   Burchette, Alinda Sierras, MD  SUMAtriptan (IMITREX) 100 MG tablet Take one at onset of migraine and may repeat 1 in 2 hours.  Maximum of 2 in 24 hours. 04/01/19   Burchette, Alinda Sierras, MD  triamcinolone cream (KENALOG) 0.1 % APPLY TOPICALLY TWICE DAILY 09/06/16   Laurey Morale, MD    Family History Family History  Problem Relation Age of Onset  . Arthritis Mother   . Depression  Mother   . Hypertension Mother   . Diabetes Mother   . Hypertension Father   . Diabetes Father     Social History Social History   Tobacco Use  . Smoking status: Former Smoker    Quit date: 05/28/2014    Years since quitting: 5.5  . Smokeless tobacco: Never Used  Substance Use Topics  . Alcohol use: No    Alcohol/week: 0.0 standard drinks  . Drug use: No     Allergies   Hydrocodone and Tramadol   Review of Systems Review of Systems   Physical Exam Triage Vital Signs ED Triage Vitals  Enc Vitals Group     BP 11/26/19 1719 136/72     Pulse Rate 11/26/19 1719 93     Resp 11/26/19 1719 18     Temp 11/26/19 1719 98.9 F (37.2 C)     Temp Source 11/26/19 1719 Oral     SpO2 11/26/19 1719 100 %     Weight --      Height --      Head Circumference --      Peak Flow --      Pain Score 11/26/19 1715 7     Pain Loc --      Pain Edu? --      Excl. in [place]? --    No data found.  Updated Vital Signs BP 136/72   Pulse 93   Temp 98.9 F (37.2 C) (Oral)   Resp 18   LMP 11/20/2019 (Exact Date)   SpO2 100%   Visual Acuity Right Eye Distance:   Left Eye Distance:   Bilateral Distance:    Right Eye Near:   Left Eye Near:    Bilateral Near:     Physical Exam Vitals and nursing note reviewed.  Constitutional:      General: She is not in acute distress.    Appearance: Normal appearance. She is well-developed. She is obese. She is not ill-appearing.  HENT:     Head: Normocephalic and atraumatic.     Right Ear: Tympanic membrane normal.     Left Ear: Tympanic membrane normal.     Nose: Congestion present.     Mouth/Throat:     Mouth: Mucous membranes are moist.     Pharynx: Oropharynx is clear.  Eyes:     Extraocular Movements: Extraocular movements intact.  Conjunctiva/sclera: Conjunctivae normal.     Pupils: Pupils are equal, round, and reactive to light.  Cardiovascular:     Rate and Rhythm: Normal rate and regular rhythm.     Heart sounds: Normal  heart sounds. No murmur.  Pulmonary:     Effort: Pulmonary effort is normal. No respiratory distress.     Breath sounds: Normal breath sounds. No stridor. No wheezing, rhonchi or rales.     Comments: Dry cough in office during exam Chest:     Chest wall: No tenderness.  Abdominal:     General: Bowel sounds are normal. There is no distension.     Palpations: Abdomen is soft. There is no mass.     Tenderness: There is no abdominal tenderness. There is no guarding or rebound.     Hernia: No hernia is present.  Musculoskeletal:        General: Normal range of motion.     Cervical back: Normal range of motion and neck supple.  Skin:    General: Skin is warm and dry.     Capillary Refill: Capillary refill takes less than 2 seconds.  Neurological:     General: No focal deficit present.     Mental Status: She is alert and oriented to person, place, and time.  Psychiatric:        Mood and Affect: Mood normal.        Behavior: Behavior normal.        Thought Content: Thought content normal.      UC Treatments / Results  Labs (all labs ordered are listed, but only abnormal results are displayed) Labs Reviewed  SARS CORONAVIRUS 2 (TAT 6-24 HRS)    EKG   Radiology No results found.  Procedures Procedures (including critical care time)  Medications Ordered in UC Medications - No data to display  Initial Impression / Assessment and Plan / UC Course  I have reviewed the triage vital signs and the nursing notes.  Pertinent labs & imaging results that were available during my care of the patient were reviewed by me and considered in my medical decision making (see chart for details).     Presents with close exposure to Covid in the last week, headache, cough, nasal congestion that has gotten worse over the last few days.  Covid swab obtained, patient instructed the results will be on MyChart.  Patient instructed to quarantine until results are back and negative.  Patient  instructed to follow-up with the ER for trouble swallowing, trouble breathing, high fever, other concerning symptoms. Final Clinical Impressions(s) / UC Diagnoses   Final diagnoses:  Close exposure to COVID-19 virus  Acute nonintractable headache, unspecified headache type  Cough  Nasal congestion     Discharge Instructions     Your COVID test is pending.  You should self quarantine until the test result is back.    Take Tylenol as needed for fever or discomfort.  Rest and keep yourself hydrated.    Go to the emergency department if you develop shortness of breath, severe diarrhea, high fever not relieved by Tylenol or ibuprofen, or other concerning symptoms.       ED Prescriptions    None     PDMP not reviewed this encounter.   Faustino Congress, NP 11/26/19 1932

## 2019-11-27 LAB — SARS CORONAVIRUS 2 (TAT 6-24 HRS): SARS Coronavirus 2: NEGATIVE

## 2020-01-20 ENCOUNTER — Telehealth: Payer: Self-pay | Admitting: *Deleted

## 2020-01-20 DIAGNOSIS — R519 Headache, unspecified: Secondary | ICD-10-CM

## 2020-01-20 NOTE — Telephone Encounter (Signed)
[facility] faxed a refill request for Ibuprofen 800mg -take 1 tablet by mouth three times a day-#90. Message sent to Dr Barbie Banner asst as last Rx was given by Dr Ethlyn Gallery.

## 2020-01-21 MED ORDER — IBUPROFEN 800 MG PO TABS: 800.0000 mg | ORAL_TABLET | Freq: Three times a day (TID) | ORAL | 5 refills | Status: DC

## 2020-01-21 NOTE — Addendum Note (Signed)
Addended by: Rebecca Eaton on: 01/21/2020 09:52 AM   Modules accepted: Orders

## 2020-01-21 NOTE — Telephone Encounter (Signed)
Rx sent in. Left a detailed message on verified voice mail.   

## 2020-01-30 ENCOUNTER — Telehealth: Payer: Self-pay | Admitting: Family Medicine

## 2020-01-30 MED ORDER — PHENTERMINE HCL 37.5 MG PO CAPS: 37.5000 mg | ORAL_CAPSULE | Freq: Every morning | ORAL | 5 refills | Status: DC

## 2020-01-30 NOTE — Telephone Encounter (Signed)
Pt would like a refill for phentermine 37.5mg  [facility] [address] [phone]

## 2020-01-30 NOTE — Telephone Encounter (Signed)
Last filled 02/17/2019 Last OV 10/02/2019  Ok to fill?

## 2020-01-30 NOTE — Telephone Encounter (Signed)
Done

## 2020-01-30 NOTE — Telephone Encounter (Signed)
ATC, mail box is full. Please inform the patient her medication has been sent in when she returns our call.

## 2020-02-26 ENCOUNTER — Other Ambulatory Visit: Payer: Self-pay

## 2020-02-26 ENCOUNTER — Emergency Department (HOSPITAL_COMMUNITY)
Admission: EM | Admit: 2020-02-26 | Discharge: 2020-02-26 | Disposition: A | Discharge status: Home / Self Care | Payer: Medicaid Other | Attending: Emergency Medicine | Admitting: Emergency Medicine

## 2020-02-26 ENCOUNTER — Encounter (HOSPITAL_COMMUNITY): Payer: Self-pay | Admitting: Emergency Medicine

## 2020-02-26 DIAGNOSIS — D492 Neoplasm of unspecified behavior of bone, soft tissue, and skin: Secondary | ICD-10-CM

## 2020-02-26 DIAGNOSIS — H0289 Other specified disorders of eyelid: Secondary | ICD-10-CM | POA: Insufficient documentation

## 2020-02-26 DIAGNOSIS — Z87891 Personal history of nicotine dependence: Secondary | ICD-10-CM | POA: Insufficient documentation

## 2020-02-26 NOTE — ED Triage Notes (Signed)
44 yo female presents c/o left eye irritation, pt states she has had a "bump" on her lower eye lid for about a year that she thought was just a mole, but for the past 2-3 weeks it has been draining pus and is now causing eye irritation. Pt denies any vision changes or fever.

## 2020-02-26 NOTE — ED Provider Notes (Signed)
[facility] DEPT Provider Note   CSN: [id number] Arrival date & time: 02/26/20  1748     History Chief Complaint  Patient presents with  . Eye Pain    Angela Hahn is a 44 y.o. female with a past medical history significant for anemia and chronic headaches who presents to the ED due to a "bump" on her left lower eyelid that has been persistent for the past year.  Patient states for the past 2 and 3 weeks the "bump" has waxed and waned in size and has drained purulent drainage twice.  She also admits to associated blurry vision only in the morning which quickly resolves throughout the day. Also notes area on lower eyelid occasionally itches. Currently denies blurry vision.  She has tried warm compresses with no relief.  Denies pain around the bump and pain with EOMs.  Denies drainage from eye.  Denies erythematous eyes and photophobia. Denies fever and chills. Denies eye injury.   History obtained from patient and past medical records. No interpreter used during encounter.      Past Medical History:  Diagnosis Date  . Anemia   . Chronic headaches   . Dysmenorrhea   . Heart murmur    dx'd since childhood    Patient Active Problem List   Diagnosis Date Noted  . Flu-like symptoms 09/30/2018  . Pain of right thumb 06/04/2018  . Concussion with loss of consciousness 09/24/2015  . Chronic low back pain 06/18/2012    Past Surgical History:  Procedure Laterality Date  . TUBAL LIGATION       OB History    Gravida  3   Para  3   Term  3   Preterm      AB      Living  3     SAB      TAB      Ectopic      Multiple      Live Births              Family History  Problem Relation Age of Onset  . Arthritis Mother   . Depression Mother   . Hypertension Mother   . Diabetes Mother   . Hypertension Father   . Diabetes Father     Social History   Tobacco Use  . Smoking status: Former Smoker    Quit date: 05/28/2014    Years  since quitting: 5.7  . Smokeless tobacco: Never Used  Vaping Use  . Vaping Use: Never used  Substance Use Topics  . Alcohol use: No    Alcohol/week: 0.0 standard drinks  . Drug use: No    Home Medications Prior to Admission medications   Medication Sig Start Date End Date Taking? Authorizing Provider  betamethasone valerate (VALISONE) 0.1 % cream Apply twice a day as needed. 03/15/16   Laurey Morale, MD  Cetirizine HCl 10 MG CAPS Take 1 capsule (10 mg total) by mouth daily. 08/28/19   Wieters, Hallie C, PA-C  dicyclomine (BENTYL) 20 MG tablet Take 1 tablet (20 mg total) by mouth 3 (three) times daily before meals for 5 days. Patient not taking: Reported on 06/20/2019 02/15/19 02/20/19  Scot Jun, FNP  etodolac (LODINE) 300 MG capsule Take 1 capsule (300 mg total) by mouth 3 (three) times daily. 06/20/19   Dorie Rank, MD  famotidine (PEPCID) 40 MG tablet TAKE 1 TABLET(40 MG) BY MOUTH TWICE DAILY FOR 10 DAYS 09/02/19   Kenton Kingfisher,  Carroll Sage, FNP  ferrous sulfate 325 (65 FE) MG tablet Take 1 tablet (325 mg total) by mouth 2 (two) times daily with a meal. Patient taking differently: Take 325 mg by mouth daily with breakfast.  06/11/18   Laurey Morale, MD  fluticasone (FLONASE) 50 MCG/ACT nasal spray Place 1-2 sprays into both nostrils daily for 7 days. 08/28/19 09/04/19  Wieters, Hallie C, PA-C  ibuprofen (ADVIL) 800 MG tablet Take 1 tablet (800 mg total) by mouth 3 (three) times daily. 01/21/20   Laurey Morale, MD  metoCLOPramide (REGLAN) 10 MG tablet Take 1 tablet (10 mg total) by mouth every 6 (six) hours. 02/15/19   Scot Jun, FNP  ondansetron (ZOFRAN ODT) 4 MG disintegrating tablet Take 1 tablet (4 mg total) by mouth every 8 (eight) hours as needed for nausea or vomiting. 08/28/19   Wieters, Hallie C, PA-C  ondansetron (ZOFRAN) 4 MG tablet Take 1 tablet (4 mg total) by mouth every 8 (eight) hours as needed for nausea or vomiting. 10/04/18   Caren Macadam, MD  phentermine 37.5  MG capsule Take 1 capsule (37.5 mg total) by mouth every morning. 01/30/20   Laurey Morale, MD  propranolol ER (INDERAL LA) 80 MG 24 hr capsule Take 1 capsule (80 mg total) by mouth at bedtime. 04/01/19   Burchette, Alinda Sierras, MD  SUMAtriptan (IMITREX) 100 MG tablet Take one at onset of migraine and may repeat 1 in 2 hours.  Maximum of 2 in 24 hours. 04/01/19   Burchette, Alinda Sierras, MD  triamcinolone cream (KENALOG) 0.1 % APPLY TOPICALLY TWICE DAILY 09/06/16   Laurey Morale, MD    Allergies    Hydrocodone and Tramadol  Review of Systems   Review of Systems  Constitutional: Negative for chills and fever.  Eyes: Positive for itching (around bump) and visual disturbance (only in the morning). Negative for photophobia, pain and redness.  Neurological: Negative for headaches.  All other systems reviewed and are negative.   Physical Exam Updated Vital Signs BP (!) 146/78 (BP Location: Right Arm)   Pulse 74   Temp 98.1 F (36.7 C) (Oral)   Resp 16   Ht 5\' 6"  (1.676 m)   Wt 82.6 kg   LMP 02/16/2020 (Exact Date)   SpO2 100%   BMI 29.38 kg/m   Physical Exam Vitals and nursing note reviewed.  Constitutional:      General: She is not in acute distress.    Appearance: She is not ill-appearing.  HENT:     Head: Normocephalic.  Eyes:     General:        Right eye: No discharge.        Left eye: No discharge.     Extraocular Movements: Extraocular movements intact.     Conjunctiva/sclera: Conjunctivae normal.     Pupils: Pupils are equal, round, and reactive to light.     Comments: 33mm growth on left lower eyelid. No drainage from growth. No surrounding erythema. EOMs intact without pain. Conjunctivae normal. No eye drainage.   Cardiovascular:     Rate and Rhythm: Normal rate and regular rhythm.     Pulses: Normal pulses.     Heart sounds: Normal heart sounds. No murmur heard.  No friction rub. No gallop.   Pulmonary:     Effort: Pulmonary effort is normal.     Breath sounds: Normal  breath sounds.  Abdominal:     General: Abdomen is flat. There is no distension.  Palpations: Abdomen is soft.     Tenderness: There is no abdominal tenderness. There is no guarding or rebound.  Musculoskeletal:     Cervical back: Neck supple.     Comments: Able to move all 4 extremities without difficulty.   Skin:    General: Skin is warm and dry.  Neurological:     General: No focal deficit present.     Mental Status: She is alert.  Psychiatric:        Mood and Affect: Mood normal.        Behavior: Behavior normal.       ED Results / Procedures / Treatments   Labs (all labs ordered are listed, but only abnormal results are displayed) Labs Reviewed - No data to display  EKG None  Radiology No results found.  Procedures Procedures (including critical care time)  Medications Ordered in ED Medications - No data to display  ED Course  I have reviewed the triage vital signs and the nursing notes.  Pertinent labs & imaging results that were available during my care of the patient were reviewed by me and considered in my medical decision making (see chart for details).    MDM Rules/Calculators/A&P                         44 year old female presents to the ED due to an abnormal growth on her left lower eyelid that has been persistent for the past year.  Patient states for the past 2 to 3 weeks she has noticed 2 episodes of purulent drainage which is now causing mild irritation to left eye.  Admits to blurry vision only in the morning which quickly resolves throughout the day.  Denies eye injury.  Stable vitals.  Patient in no acute distress and nonill appearing.  6 mm growth on left lower eyelid. No drainage from growth. No surrounding erythema. EOMs intact without pain. Conjunctivae normal. No concern for conjunctivitis. No eye drainage. No tenderness to palpation over growth.  Bilateral visual acuity 20/15; R: 20/20; L: 20/25. Will discharge patient with ophthalmology  referral for possible removal. Advised patient to continue using warm compresses. Strict ED precautions discussed with patient. Patient states understanding and agrees to plan. Patient discharged home in no acute distress and stable vitals.  Final Clinical Impression(s) / ED Diagnoses Final diagnoses:  Growth of eyelid    Rx / DC Orders ED Discharge Orders    None       Karie Kirks 02/26/20 Elray Mcgregor, MD 02/26/20 2007

## 2020-02-26 NOTE — Discharge Instructions (Addendum)
As discussed, continue to use warm compresses over growth. I have included the number of ophthalmology. Please call tomorrow to schedule an appointment for further evaluation. Return to the ER for new or worsening symptoms.

## 2020-02-27 ENCOUNTER — Other Ambulatory Visit: Payer: Self-pay | Admitting: Family Medicine

## 2020-04-25 ENCOUNTER — Encounter (HOSPITAL_COMMUNITY): Payer: Self-pay | Admitting: Emergency Medicine

## 2020-04-25 ENCOUNTER — Emergency Department (HOSPITAL_COMMUNITY)
Admission: EM | Admit: 2020-04-25 | Discharge: 2020-04-25 | Disposition: A | Discharge status: Home / Self Care | Payer: No Typology Code available for payment source | Attending: Emergency Medicine | Admitting: Emergency Medicine

## 2020-04-25 ENCOUNTER — Other Ambulatory Visit: Payer: Self-pay

## 2020-04-25 ENCOUNTER — Emergency Department (HOSPITAL_COMMUNITY): Payer: No Typology Code available for payment source

## 2020-04-25 DIAGNOSIS — M79605 Pain in left leg: Secondary | ICD-10-CM | POA: Diagnosis not present

## 2020-04-25 DIAGNOSIS — Z87891 Personal history of nicotine dependence: Secondary | ICD-10-CM | POA: Diagnosis not present

## 2020-04-25 DIAGNOSIS — T148XXA Other injury of unspecified body region, initial encounter: Secondary | ICD-10-CM | POA: Diagnosis present

## 2020-04-25 DIAGNOSIS — M79604 Pain in right leg: Secondary | ICD-10-CM | POA: Insufficient documentation

## 2020-04-25 DIAGNOSIS — R0789 Other chest pain: Secondary | ICD-10-CM | POA: Diagnosis not present

## 2020-04-25 DIAGNOSIS — R519 Headache, unspecified: Secondary | ICD-10-CM | POA: Diagnosis not present

## 2020-04-25 DIAGNOSIS — M545 Low back pain: Secondary | ICD-10-CM | POA: Diagnosis not present

## 2020-04-25 DIAGNOSIS — Y999 Unspecified external cause status: Secondary | ICD-10-CM | POA: Insufficient documentation

## 2020-04-25 DIAGNOSIS — Y929 Unspecified place or not applicable: Secondary | ICD-10-CM | POA: Diagnosis not present

## 2020-04-25 DIAGNOSIS — Y939 Activity, unspecified: Secondary | ICD-10-CM | POA: Diagnosis not present

## 2020-04-25 DIAGNOSIS — M542 Cervicalgia: Secondary | ICD-10-CM | POA: Diagnosis not present
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
## 2020-04-25 MED ORDER — CYCLOBENZAPRINE HCL 10 MG PO TABS: 10.0000 mg | ORAL_TABLET | Freq: Two times a day (BID) | ORAL | 0 refills | Status: DC | PRN
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
## 2020-04-25 NOTE — Discharge Instructions (Addendum)
You were evaluated in the Emergency Department and after careful evaluation, we did not find any emergent condition requiring admission or further testing in the hospital.  Your exam/testing today was overall reassuring.  Your x-rays and CT scans were without broken bones or significant injuries.  We recommend Tylenol or Motrin at home for discomfort.  For more significant pain keeping you from sleeping, you can use the muscle relaxer provided.  Please use caution as this medication may cause drowsiness.  Please return to the Emergency Department if you experience any worsening of your condition.  Thank you for allowing [place] to be a part of your care.

## 2020-04-27 ENCOUNTER — Other Ambulatory Visit: Payer: Self-pay

## 2020-04-28 ENCOUNTER — Ambulatory Visit (INDEPENDENT_AMBULATORY_CARE_PROVIDER_SITE_OTHER): Payer: Self-pay | Admitting: Family Medicine

## 2020-04-28 ENCOUNTER — Encounter: Payer: Self-pay | Admitting: Family Medicine

## 2020-04-28 VITALS — BP 122/60 | HR 87 | Temp 99.0°F | Wt 204.8 lb

## 2020-04-28 DIAGNOSIS — S63601D Unspecified sprain of right thumb, subsequent encounter: Secondary | ICD-10-CM

## 2020-04-28 DIAGNOSIS — S63619D Unspecified sprain of unspecified finger, subsequent encounter: Secondary | ICD-10-CM

## 2020-04-28 DIAGNOSIS — S20219D Contusion of unspecified front wall of thorax, subsequent encounter: Secondary | ICD-10-CM

## 2020-04-28 DIAGNOSIS — S161XXD Strain of muscle, fascia and tendon at neck level, subsequent encounter: Secondary | ICD-10-CM

## 2020-04-28 MED ORDER — METHYLPREDNISOLONE 4 MG PO TBPK: ORAL_TABLET | ORAL | 0 refills | Status: DC

## 2020-04-28 MED ORDER — CYCLOBENZAPRINE HCL 10 MG PO TABS: 10.0000 mg | ORAL_TABLET | Freq: Three times a day (TID) | ORAL | 1 refills | Status: DC | PRN
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
## 2020-09-09 ENCOUNTER — Other Ambulatory Visit: Payer: Medicaid Other

## 2020-09-16 ENCOUNTER — Other Ambulatory Visit: Payer: Self-pay | Admitting: Family Medicine

## 2020-09-24 ENCOUNTER — Telehealth (INDEPENDENT_AMBULATORY_CARE_PROVIDER_SITE_OTHER): Payer: Self-pay | Admitting: Family Medicine

## 2020-09-24 DIAGNOSIS — U071 COVID-19: Secondary | ICD-10-CM

## 2020-09-24 MED ORDER — BENZONATATE 100 MG PO CAPS: 100.0000 mg | ORAL_CAPSULE | Freq: Three times a day (TID) | ORAL | 0 refills | Status: DC | PRN

## 2020-09-24 NOTE — Patient Instructions (Addendum)
  HOME CARE TIPS:  -I sent the medication(s) we discussed to your pharmacy: Meds ordered this encounter  Medications  . benzonatate (TESSALON PERLES) 100 MG capsule    Sig: Take 1 capsule (100 mg total) by mouth 3 (three) times daily as needed.    Dispense:  20 capsule    Refill:  0      -can use tylenol or aleve if needed for fevers, aches and pains per instructions  -can use nasal saline a few times per day if you have nasal congestion; sometimes  a short course of Afrin nasal spray for 3 days can help with symptoms as well  -stay hydrated, drink plenty of fluids and eat small healthy meals - avoid dairy  -can take 1000 IU (25mcg) Vit D3 and 100-500 mg of Vit C daily per instructions  -If the Covid test is positive, check out the CDC website for more information on home care, transmission and treatment for COVID19  -follow up with your doctor in 2-3 days unless improving and feeling better  -stay home while sick, except to seek medical care, and if you have COVID19 ideally it would be best to stay home for a full 10 days since the onset of symptoms PLUS one day of no fever and feeling better. Wear a good mask (such as N95 or KN95) if around others to reduce the risk of transmission.  It was nice to meet you today, and I really hope you are feeling better soon. I help [facility] out with telemedicine visits on Tuesdays and Thursdays and am available for visits on those days. If you have any concerns or questions following this visit please schedule a follow up visit with your Primary Care doctor or seek care at a local urgent care clinic to avoid delays in care.    Seek in person care or schedule a follow up video visit promptly if your symptoms worsen, new concerns arise or you are not improving with treatment. Call 911 and/or seek emergency care if your symptoms are severe or life threatening.   

## 2020-09-24 NOTE — Progress Notes (Signed)
Virtual Visit via Video Note  I connected with [place]  on 09/24/20 at  4:40 PM EST by a video enabled telemedicine application and verified that I am speaking with the correct person using two identifiers.  Location patient: home,  Location provider:work or home office Persons participating in the virtual visit: patient, provider  I discussed the limitations of evaluation and management by telemedicine and the availability of in person appointments. The patient expressed understanding and agreed to proceed.   HPI:  Acute telemedicine visit for COVID19: -Onset: 3 days ago; tested positive for covid yesterday -Symptoms include:started with body aches and headache, nasal congestion, cough,  nose is stopped up, bad sore throat today, loose stools -Denies:CP, SOB, vomiting -Has tried:ibuprfen, tylenol, nyquil, halls vitamins -Pertinent past medical history: none per patient -Pertinent medication allergies: tramadol, hydrocodone -COVID-19 vaccine status: not vaccinated  ROS: See pertinent positives and negatives per HPI.  Past Medical History:  Diagnosis Date  . Anemia   . Chronic headaches   . Dysmenorrhea   . Heart murmur    dx'd since childhood    Past Surgical History:  Procedure Laterality Date  . TUBAL LIGATION       Current Outpatient Medications:  .  benzonatate (TESSALON PERLES) 100 MG capsule, Take 1 capsule (100 mg total) by mouth 3 (three) times daily as needed., Disp: 20 capsule, Rfl: 0 .  betamethasone valerate (VALISONE) 0.1 % cream, Apply twice a day as needed., Disp: 30 g, Rfl: 5 .  Cetirizine HCl 10 MG CAPS, Take 1 capsule (10 mg total) by mouth daily., Disp: 15 capsule, Rfl: 0 .  cyclobenzaprine (FLEXERIL) 10 MG tablet, Take 1 tablet (10 mg total) by mouth 3 (three) times daily as needed for muscle spasms., Disp: 60 tablet, Rfl: 1 .  dicyclomine (BENTYL) 20 MG tablet, Take 1 tablet (20 mg total) by mouth 3 (three) times daily before meals for 5 days.  (Patient not taking: Reported on 06/20/2019), Disp: 15 tablet, Rfl: 0 .  etodolac (LODINE) 300 MG capsule, Take 1 capsule (300 mg total) by mouth 3 (three) times daily., Disp: 21 capsule, Rfl: 0 .  famotidine (PEPCID) 40 MG tablet, TAKE 1 TABLET(40 MG) BY MOUTH TWICE DAILY FOR 10 DAYS, Disp: 20 tablet, Rfl: 0 .  ferrous sulfate 325 (65 FE) MG tablet, Take 1 tablet (325 mg total) by mouth 2 (two) times daily with a meal. (Patient taking differently: Take 325 mg by mouth daily with breakfast. ), Disp: 60 tablet, Rfl: 11 .  fluticasone (FLONASE) 50 MCG/ACT nasal spray, Place 1-2 sprays into both nostrils daily for 7 days., Disp: 1 g, Rfl: 0 .  ibuprofen (ADVIL) 800 MG tablet, Take 1 tablet (800 mg total) by mouth 3 (three) times daily., Disp: 90 tablet, Rfl: 5 .  methylPREDNISolone (MEDROL DOSEPAK) 4 MG TBPK tablet, As directed, Disp: 21 tablet, Rfl: 0 .  metoCLOPramide (REGLAN) 10 MG tablet, Take 1 tablet (10 mg total) by mouth every 6 (six) hours., Disp: 30 tablet, Rfl: 0 .  ondansetron (ZOFRAN ODT) 4 MG disintegrating tablet, Take 1 tablet (4 mg total) by mouth every 8 (eight) hours as needed for nausea or vomiting., Disp: 20 tablet, Rfl: 0 .  ondansetron (ZOFRAN) 4 MG tablet, Take 1 tablet (4 mg total) by mouth every 8 (eight) hours as needed for nausea or vomiting., Disp: 30 tablet, Rfl: 0 .  phentermine 37.5 MG capsule, TAKE 1 CAPSULE(37.5 MG) BY MOUTH EVERY MORNING, Disp: 30 capsule, Rfl: 5 .  propranolol  ER (INDERAL LA) 80 MG 24 hr capsule, Take 1 capsule (80 mg total) by mouth at bedtime., Disp: 30 capsule, Rfl: 1 .  SUMAtriptan (IMITREX) 100 MG tablet, Take one at onset of migraine and may repeat 1 in 2 hours.  Maximum of 2 in 24 hours., Disp: 10 tablet, Rfl: 1 .  triamcinolone cream (KENALOG) 0.1 %, APPLY TOPICALLY TWICE DAILY, Disp: 45 g, Rfl: 2  EXAM:  VITALS per patient if applicable:  GENERAL: alert, oriented, appears well and in no acute distress  HEENT: atraumatic, conjunttiva  clear, no obvious abnormalities on inspection of external nose and ears  NECK: normal movements of the head and neck  LUNGS: on inspection no signs of respiratory distress, breathing rate appears normal, no obvious gross SOB, gasping or wheezing  CV: no obvious cyanosis  MS: moves all visible extremities without noticeable abnormality  PSYCH/NEURO: pleasant and cooperative, no obvious depression or anxiety, speech and thought processing grossly intact  ASSESSMENT AND PLAN:  Discussed the following assessment and plan:  COVID-19  -we discussed possible serious and likely etiologies, options for evaluation and workup, limitations of telemedicine visit vs in person visit, treatment, treatment risks and precautions. Pt prefers to treat via telemedicine empirically rather than in person at this moment. Discussed treatment options, potential complications, isolation and precautions. She opted for Tessalon for cough, analgesics (safe dosing and interactions discussed), staying hydrated, nasal saline, discussed safe dosing of vitamins and other care measures summarized in patient instructions. Work/School slipped offered: declined Scheduled follow up with PCP offered: agrees to schedule follow up if needed. Advised to seek prompt in person care if worsening, new symptoms arise, or if is not improving with treatment. Discussed options for inperson care if PCP office not available. Did let this patient know that I only do telemedicine on Tuesdays and Thursdays for [place]. Advised to schedule follow up visit with PCP or [facility] if any further questions or concerns to avoid delays in care.   I discussed the assessment and treatment plan with the patient. The patient was provided an opportunity to ask questions and all were answered. The patient agreed with the plan and demonstrated an understanding of the instructions.     Lucretia Kern, DO

## 2020-10-22 ENCOUNTER — Telehealth: Payer: Medicaid Other | Admitting: Family Medicine

## 2020-10-22 DIAGNOSIS — N920 Excessive and frequent menstruation with regular cycle: Secondary | ICD-10-CM

## 2020-10-22 NOTE — Progress Notes (Signed)
I dialed her phone number 4 times over a 20 minute period with no answer

## 2020-10-27 ENCOUNTER — Encounter: Payer: Self-pay | Admitting: Family Medicine

## 2020-10-27 ENCOUNTER — Telehealth (INDEPENDENT_AMBULATORY_CARE_PROVIDER_SITE_OTHER): Payer: Medicaid Other | Admitting: Family Medicine

## 2020-10-27 VITALS — Wt 204.0 lb

## 2020-10-27 DIAGNOSIS — N920 Excessive and frequent menstruation with regular cycle: Secondary | ICD-10-CM | POA: Insufficient documentation

## 2020-10-27 DIAGNOSIS — N921 Excessive and frequent menstruation with irregular cycle: Secondary | ICD-10-CM | POA: Insufficient documentation

## 2020-10-27 MED ORDER — NORGESTIM-ETH ESTRAD TRIPHASIC 0.18/0.215/0.25 MG-35 MCG PO TABS: 1.0000 | ORAL_TABLET | Freq: Every day | ORAL | 3 refills | Status: DC

## 2020-10-27 MED ORDER — MELOXICAM 15 MG PO TABS: 15.0000 mg | ORAL_TABLET | Freq: Every day | ORAL | 3 refills | Status: DC

## 2020-10-27 NOTE — Progress Notes (Signed)
Subjective:    Patient ID: Angela Hahn, female    DOB: 08/24/1976, 45 y.o.   MRN: [id number]  HPI Virtual Visit via Video Note  I connected with the patient on 10/27/20 at  1:30 PM EST by a video enabled telemedicine application and verified that I am speaking with the correct person using two identifiers.  Location patient: home Location provider:work or home office Persons participating in the virtual visit: patient, provider  I discussed the limitations of evaluation and management by telemedicine and the availability of in person appointments. The patient expressed understanding and agreed to proceed.   HPI: Here for heavy painful periods and for her most recent cycle that lasted from 10-09-20 until yesterday. She had done well by taking BCP in the past, but she had to stop these about 8 years ago because she was smoking. She quit smoking about 9 months ago.    ROS: See pertinent positives and negatives per HPI.  Past Medical History:  Diagnosis Date  . Anemia   . Chronic headaches   . Dysmenorrhea   . Heart murmur    dx'd since childhood    Past Surgical History:  Procedure Laterality Date  . TUBAL LIGATION      Family History  Problem Relation Age of Onset  . Arthritis Mother   . Depression Mother   . Hypertension Mother   . Diabetes Mother   . Hypertension Father   . Diabetes Father      Current Outpatient Medications:  .  betamethasone valerate (VALISONE) 0.1 % cream, Apply twice a day as needed., Disp: 30 g, Rfl: 5 .  Cetirizine HCl 10 MG CAPS, Take 1 capsule (10 mg total) by mouth daily., Disp: 15 capsule, Rfl: 0 .  cyclobenzaprine (FLEXERIL) 10 MG tablet, Take 1 tablet (10 mg total) by mouth 3 (three) times daily as needed for muscle spasms., Disp: 60 tablet, Rfl: 1 .  famotidine (PEPCID) 40 MG tablet, TAKE 1 TABLET(40 MG) BY MOUTH TWICE DAILY FOR 10 DAYS, Disp: 20 tablet, Rfl: 0 .  ferrous sulfate 325 (65 FE) MG tablet, Take 1 tablet (325 mg total) by  mouth 2 (two) times daily with a meal. (Patient taking differently: Take 325 mg by mouth daily with breakfast.), Disp: 60 tablet, Rfl: 11 .  ibuprofen (ADVIL) 800 MG tablet, Take 1 tablet (800 mg total) by mouth 3 (three) times daily., Disp: 90 tablet, Rfl: 5 .  meloxicam (MOBIC) 15 MG tablet, Take 1 tablet (15 mg total) by mouth daily., Disp: 90 tablet, Rfl: 3 .  methylPREDNISolone (MEDROL DOSEPAK) 4 MG TBPK tablet, As directed, Disp: 21 tablet, Rfl: 0 .  metoCLOPramide (REGLAN) 10 MG tablet, Take 1 tablet (10 mg total) by mouth every 6 (six) hours., Disp: 30 tablet, Rfl: 0 .  Norgestimate-Ethinyl Estradiol Triphasic (TRI-SPRINTEC) 0.18/0.215/0.25 MG-35 MCG tablet, Take 1 tablet by mouth daily., Disp: 84 tablet, Rfl: 3 .  ondansetron (ZOFRAN ODT) 4 MG disintegrating tablet, Take 1 tablet (4 mg total) by mouth every 8 (eight) hours as needed for nausea or vomiting., Disp: 20 tablet, Rfl: 0 .  ondansetron (ZOFRAN) 4 MG tablet, Take 1 tablet (4 mg total) by mouth every 8 (eight) hours as needed for nausea or vomiting., Disp: 30 tablet, Rfl: 0 .  phentermine 37.5 MG capsule, TAKE 1 CAPSULE(37.5 MG) BY MOUTH EVERY MORNING, Disp: 30 capsule, Rfl: 5 .  propranolol ER (INDERAL LA) 80 MG 24 hr capsule, Take 1 capsule (80 mg total) by mouth  at bedtime., Disp: 30 capsule, Rfl: 1 .  SUMAtriptan (IMITREX) 100 MG tablet, Take one at onset of migraine and may repeat 1 in 2 hours.  Maximum of 2 in 24 hours., Disp: 10 tablet, Rfl: 1 .  triamcinolone cream (KENALOG) 0.1 %, APPLY TOPICALLY TWICE DAILY, Disp: 45 g, Rfl: 2 .  fluticasone (FLONASE) 50 MCG/ACT nasal spray, Place 1-2 sprays into both nostrils daily for 7 days., Disp: 1 g, Rfl: 0  EXAM:  VITALS per patient if applicable:  GENERAL: alert, oriented, appears well and in no acute distress  HEENT: atraumatic, conjunttiva clear, no obvious abnormalities on inspection of external nose and ears  NECK: normal movements of the head and neck  LUNGS: on  inspection no signs of respiratory distress, breathing rate appears normal, no obvious gross SOB, gasping or wheezing  CV: no obvious cyanosis  MS: moves all visible extremities without noticeable abnormality  PSYCH/NEURO: pleasant and cooperative, no obvious depression or anxiety, speech and thought processing grossly intact  ASSESSMENT AND PLAN: Menorrhagia and dysmenorrhea. She will get back on Tri Sprintec daily. Add Meloxicm daily as needed. Recheck prn.  Alysia Penna, MD  Discussed the following assessment and plan:  No diagnosis found.     I discussed the assessment and treatment plan with the patient. The patient was provided an opportunity to ask questions and all were answered. The patient agreed with the plan and demonstrated an understanding of the instructions.   The patient was advised to call back or seek an in-person evaluation if the symptoms worsen or if the condition fails to improve as anticipated.     Review of Systems     Objective:   Physical Exam        Assessment & Plan:

## 2020-12-01 ENCOUNTER — Telehealth: Payer: Self-pay | Admitting: Family Medicine

## 2020-12-01 ENCOUNTER — Other Ambulatory Visit: Payer: Self-pay

## 2020-12-01 MED ORDER — TRIAMCINOLONE ACETONIDE 0.1 % EX CREA: TOPICAL_CREAM | Freq: Two times a day (BID) | CUTANEOUS | 2 refills | Status: DC

## 2020-12-01 NOTE — Telephone Encounter (Signed)
Patient was needing this cream sent to  [facility] [address], [place] Phone:  [phone]  Fax:  [phone]

## 2020-12-01 NOTE — Telephone Encounter (Signed)
Patient is calling requesting a refill for triamcinolone cream (KENALOG) 0.1 % to be sent to [facility] on [address], please advise. CB is [phone]

## 2020-12-01 NOTE — Telephone Encounter (Signed)
Refilled

## 2020-12-02 ENCOUNTER — Other Ambulatory Visit: Payer: Self-pay

## 2020-12-02 MED ORDER — TRIAMCINOLONE ACETONIDE 0.1 % EX CREA: 1.0000 "application " | TOPICAL_CREAM | Freq: Two times a day (BID) | CUTANEOUS | 2 refills | Status: DC

## 2020-12-02 MED ORDER — TRIAMCINOLONE ACETONIDE 0.1 % EX CREA: TOPICAL_CREAM | Freq: Two times a day (BID) | CUTANEOUS | 2 refills | Status: DC

## 2020-12-02 NOTE — Telephone Encounter (Signed)
Rx did not make it to the pharmacy.  New refill sent

## 2020-12-02 NOTE — Telephone Encounter (Signed)
Medication has been resent to [facility] on [address]

## 2020-12-02 NOTE — Addendum Note (Signed)
Addended by: Westley Hummer B on: 12/02/2020 03:16 PM   Modules accepted: Orders

## 2020-12-17 ENCOUNTER — Encounter (HOSPITAL_COMMUNITY): Payer: Self-pay | Admitting: Emergency Medicine

## 2020-12-17 ENCOUNTER — Other Ambulatory Visit: Payer: Self-pay

## 2020-12-17 ENCOUNTER — Ambulatory Visit (HOSPITAL_COMMUNITY)
Admission: EM | Admit: 2020-12-17 | Discharge: 2020-12-17 | Disposition: A | Discharge status: Home / Self Care | Payer: Medicaid Other | Attending: Student | Admitting: Student

## 2020-12-17 DIAGNOSIS — M5412 Radiculopathy, cervical region: Secondary | ICD-10-CM

## 2020-12-17 MED ORDER — TIZANIDINE HCL 2 MG PO CAPS: 2.0000 mg | ORAL_CAPSULE | Freq: Three times a day (TID) | ORAL | 0 refills | Status: DC

## 2020-12-17 MED ORDER — PREDNISONE 20 MG PO TABS: 40.0000 mg | ORAL_TABLET | Freq: Every day | ORAL | 0 refills | Status: AC

## 2020-12-17 NOTE — ED Triage Notes (Signed)
Pt presents today with pain in right shoulder that radiates down right arm into fingers x 2 weeks. Denies injury.
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
## 2020-12-17 NOTE — Discharge Instructions (Signed)
-  Prednisone 2 pills taken at the same time for 5 days in a row.  Try taking this earlier in the day as it can give you energy. -Start the muscle relaxer-Zanaflex (tizanidine), up to 3 times daily for muscle spasms and pain.  This can make you drowsy, so take at bedtime or when you do not need to drive or operate machinery. -You can continue tylenol/ibuprofen.  Make sure to take ibuprofen with food. -Follow-up with orthopedist if symptoms persist, information below.

## 2020-12-28 ENCOUNTER — Telehealth: Payer: Self-pay | Admitting: Family Medicine

## 2020-12-29 ENCOUNTER — Telehealth (INDEPENDENT_AMBULATORY_CARE_PROVIDER_SITE_OTHER): Payer: Medicaid Other | Admitting: Family Medicine

## 2020-12-29 ENCOUNTER — Encounter: Payer: Self-pay | Admitting: Family Medicine

## 2020-12-29 DIAGNOSIS — M542 Cervicalgia: Secondary | ICD-10-CM

## 2020-12-29 MED ORDER — PREDNISONE 10 MG PO TABS: 20.0000 mg | ORAL_TABLET | Freq: Two times a day (BID) | ORAL | 0 refills | Status: DC

## 2020-12-29 MED ORDER — TIZANIDINE HCL 2 MG PO CAPS: 2.0000 mg | ORAL_CAPSULE | Freq: Three times a day (TID) | ORAL | 0 refills | Status: DC
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
## 2021-01-05 ENCOUNTER — Other Ambulatory Visit: Payer: Self-pay

## 2021-01-05 ENCOUNTER — Telehealth: Payer: Self-pay | Admitting: Family Medicine

## 2021-01-05 ENCOUNTER — Ambulatory Visit (HOSPITAL_COMMUNITY)
Admission: EM | Admit: 2021-01-05 | Discharge: 2021-01-05 | Disposition: A | Discharge status: Home / Self Care | Payer: Medicaid Other | Attending: Physician Assistant | Admitting: Physician Assistant

## 2021-01-05 ENCOUNTER — Encounter (HOSPITAL_COMMUNITY): Payer: Self-pay | Admitting: *Deleted

## 2021-01-05 DIAGNOSIS — R002 Palpitations: Secondary | ICD-10-CM

## 2021-01-05 DIAGNOSIS — R000 Tachycardia, unspecified: Secondary | ICD-10-CM

## 2021-01-05 DIAGNOSIS — R42 Dizziness and giddiness: Secondary | ICD-10-CM

## 2021-01-05 LAB — POC URINE PREG, ED: Preg Test, Ur: NEGATIVE

## 2021-01-05 LAB — POCT URINALYSIS DIPSTICK, ED / UC
Bilirubin Urine: NEGATIVE
Glucose, UA: NEGATIVE mg/dL
Hgb urine dipstick: NEGATIVE
Ketones, ur: NEGATIVE mg/dL
Leukocytes,Ua: NEGATIVE
Nitrite: NEGATIVE
Protein, ur: NEGATIVE mg/dL
Specific Gravity, Urine: 1.025 (ref 1.005–1.030)
Urobilinogen, UA: 0.2 mg/dL (ref 0.0–1.0)
pH: 6 (ref 5.0–8.0)

## 2021-01-05 LAB — CBG MONITORING, ED: Glucose-Capillary: 108 mg/dL — ABNORMAL HIGH (ref 70–99)

## 2021-01-05 NOTE — Telephone Encounter (Signed)
error 

## 2021-01-05 NOTE — ED Triage Notes (Signed)
Pt reports the heart racing,dizzyness and feeling dizzy.

## 2021-01-05 NOTE — Discharge Instructions (Signed)
Stop prednisone and tizanidine.  Make sure you are drinking plenty of fluid.  Follow-up with your primary care doctor.  If you have any chest pain or shortness of breath you need to go to the ER.
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
## 2021-01-24 ENCOUNTER — Other Ambulatory Visit: Payer: Self-pay | Admitting: Family Medicine

## 2021-03-08 ENCOUNTER — Telehealth (INDEPENDENT_AMBULATORY_CARE_PROVIDER_SITE_OTHER): Payer: Medicaid Other | Admitting: Family Medicine

## 2021-03-08 ENCOUNTER — Encounter: Payer: Self-pay | Admitting: Family Medicine

## 2021-03-08 DIAGNOSIS — U071 COVID-19: Secondary | ICD-10-CM

## 2021-03-08 HISTORY — DX: COVID-19: U07.1

## 2021-03-08 MED ORDER — OXYCODONE-ACETAMINOPHEN 10-325 MG PO TABS: 1.0000 | ORAL_TABLET | ORAL | 0 refills | Status: AC | PRN

## 2021-03-08 MED ORDER — ONDANSETRON HCL 8 MG PO TABS: 8.0000 mg | ORAL_TABLET | Freq: Four times a day (QID) | ORAL | 0 refills | Status: DC | PRN

## 2021-03-08 NOTE — Progress Notes (Signed)
   Subjective:    Patient ID: Angela Hahn, female    DOB: Sep 06, 1975, 45 y.o.   MRN: [id number]  HPI Virtual Visit via Telephone Note  I connected with the patient on 03/08/21 at  4:00 PM EDT by telephone and verified that I am speaking with the correct person using two identifiers.   I discussed the limitations, risks, security and privacy concerns of performing an evaluation and management service by telephone and the availability of in person appointments. I also discussed with the patient that there may be a patient responsible charge related to this service. The patient expressed understanding and agreed to proceed.  Location patient: home Location provider: work or home office Participants present for the call: patient, provider Patient did not have a visit in the prior 7 days to address this/these issue(s).   History of Present Illness: Here for help with residual symptoms from a recent Covid-19 infection. About 10 days ago she developed body aches, headache, a bad ST, nausea, and fever to 100.4 degrees. She tested positive for the Covid virus at that time. She has been quarantined at home since then. The fever and ST resolved, but the body aches and the nausea persist. She never had a cough or SOB. She tested negative for the Covid virus today. She has been drinking fluids and taking 800 mg of Ibuprofen every 6 hours with little relief. She asks for a small supply of something stronger for the muscle aches. She wants to return to work tomorrow.    Observations/Objective: Patient sounds cheerful and well on the phone. I do not appreciate any SOB. Speech and thought processing are grossly intact. Patient reported vitals:  Assessment and Plan: Covid-19 infection. We will let her try Zofran as needed for the nausea. She can try Percocet for the body aches for a few days. Hopefully she can return to work tomorrow. Alysia Penna, MD   Follow Up Instructions:     [phone]  [phone] 21-30 I did not refer this patient for an OV in the next 24 hours for this/these issue(s).  I discussed the assessment and treatment plan with the patient. The patient was provided an opportunity to ask questions and all were answered. The patient agreed with the plan and demonstrated an understanding of the instructions.   The patient was advised to call back or seek an in-person evaluation if the symptoms worsen or if the condition fails to improve as anticipated.  I provided  15 minutes of non-face-to-face time during this encounter.   Alysia Penna, MD     Review of Systems     Objective:   Physical Exam        Assessment & Plan:

## 2021-04-05 ENCOUNTER — Other Ambulatory Visit: Payer: Self-pay | Admitting: Family Medicine

## 2021-04-30 ENCOUNTER — Other Ambulatory Visit: Payer: Self-pay | Admitting: Family Medicine

## 2021-05-05 ENCOUNTER — Other Ambulatory Visit: Payer: Self-pay

## 2021-05-05 ENCOUNTER — Telehealth: Payer: Self-pay | Admitting: Family Medicine

## 2021-05-05 MED ORDER — PHENTERMINE HCL 37.5 MG PO CAPS: 37.5000 mg | ORAL_CAPSULE | Freq: Every morning | ORAL | 1 refills | Status: DC

## 2021-05-05 MED ORDER — NORGESTIM-ETH ESTRAD TRIPHASIC 0.18/0.215/0.25 MG-35 MCG PO TABS: 1.0000 | ORAL_TABLET | Freq: Every day | ORAL | 3 refills | Status: DC

## 2021-05-05 NOTE — Telephone Encounter (Signed)
PT called to request a refill of the following meds to be sent into the Rx on file:  phentermine 37.5 MG capsule Norgestimate-Ethinyl Estradiol Triphasic (TRI-SPRINTEC) 0.18/0.215/0.25 MG-35 MCG tablet

## 2021-05-05 NOTE — Telephone Encounter (Signed)
Pt Rx sent to her pharmacy, pt notified

## 2021-05-07 ENCOUNTER — Other Ambulatory Visit: Payer: Self-pay | Admitting: Family Medicine

## 2021-05-12 ENCOUNTER — Other Ambulatory Visit: Payer: Self-pay | Admitting: Family Medicine

## 2021-05-13 ENCOUNTER — Other Ambulatory Visit: Payer: Self-pay

## 2021-05-13 MED ORDER — PHENTERMINE HCL 37.5 MG PO CAPS: 37.5000 mg | ORAL_CAPSULE | Freq: Every morning | ORAL | 1 refills | Status: DC

## 2021-05-16 ENCOUNTER — Telehealth: Payer: Self-pay | Admitting: Family Medicine

## 2021-05-16 NOTE — Telephone Encounter (Signed)
Patient called stating that she can see that a refill of phentermine 37.5 MG capsule was sent but [facility] is stating that they have not received it. She is needing that refill to be sent to [facility] [phone] - [place], [place].  Please advise.

## 2021-05-17 MED ORDER — PHENTERMINE HCL 37.5 MG PO CAPS
37.5000 mg | ORAL_CAPSULE | Freq: Every morning | ORAL | 1 refills | Status: DC
Start: 2021-05-17 — End: 2021-06-25

## 2021-05-17 NOTE — Telephone Encounter (Signed)
Medication was sent in on 05/13/21, but never went through.   Please resend to [facility] on [address].

## 2021-05-17 NOTE — Telephone Encounter (Signed)
Done

## 2021-05-20 ENCOUNTER — Encounter (HOSPITAL_COMMUNITY): Payer: Self-pay

## 2021-05-20 ENCOUNTER — Other Ambulatory Visit: Payer: Self-pay

## 2021-05-20 ENCOUNTER — Emergency Department (HOSPITAL_COMMUNITY): Payer: 59

## 2021-05-20 ENCOUNTER — Emergency Department (HOSPITAL_COMMUNITY)
Admission: EM | Admit: 2021-05-20 | Discharge: 2021-05-20 | Disposition: A | Discharge status: Home / Self Care | Payer: 59 | Attending: Emergency Medicine | Admitting: Emergency Medicine

## 2021-05-20 DIAGNOSIS — Z87891 Personal history of nicotine dependence: Secondary | ICD-10-CM | POA: Insufficient documentation

## 2021-05-20 DIAGNOSIS — M25562 Pain in left knee: Secondary | ICD-10-CM | POA: Diagnosis present

## 2021-05-20 DIAGNOSIS — M25462 Effusion, left knee: Secondary | ICD-10-CM | POA: Insufficient documentation

## 2021-05-20 DIAGNOSIS — Z8616 Personal history of COVID-19: Secondary | ICD-10-CM | POA: Insufficient documentation

## 2021-05-20 DIAGNOSIS — W228XXA Striking against or struck by other objects, initial encounter: Secondary | ICD-10-CM | POA: Insufficient documentation

## 2021-05-20 DIAGNOSIS — Y92838 Other recreation area as the place of occurrence of the external cause: Secondary | ICD-10-CM | POA: Insufficient documentation

## 2021-05-20 IMAGING — DX DG KNEE 1-2V*L*
2 series · 2 of 2 positions shown · non-contrast
Comparison: X-ray dated [DATE]

CLINICAL DATA: Left knee pain

EXAM:
LEFT KNEE - 2 VIEW

[knee ap]
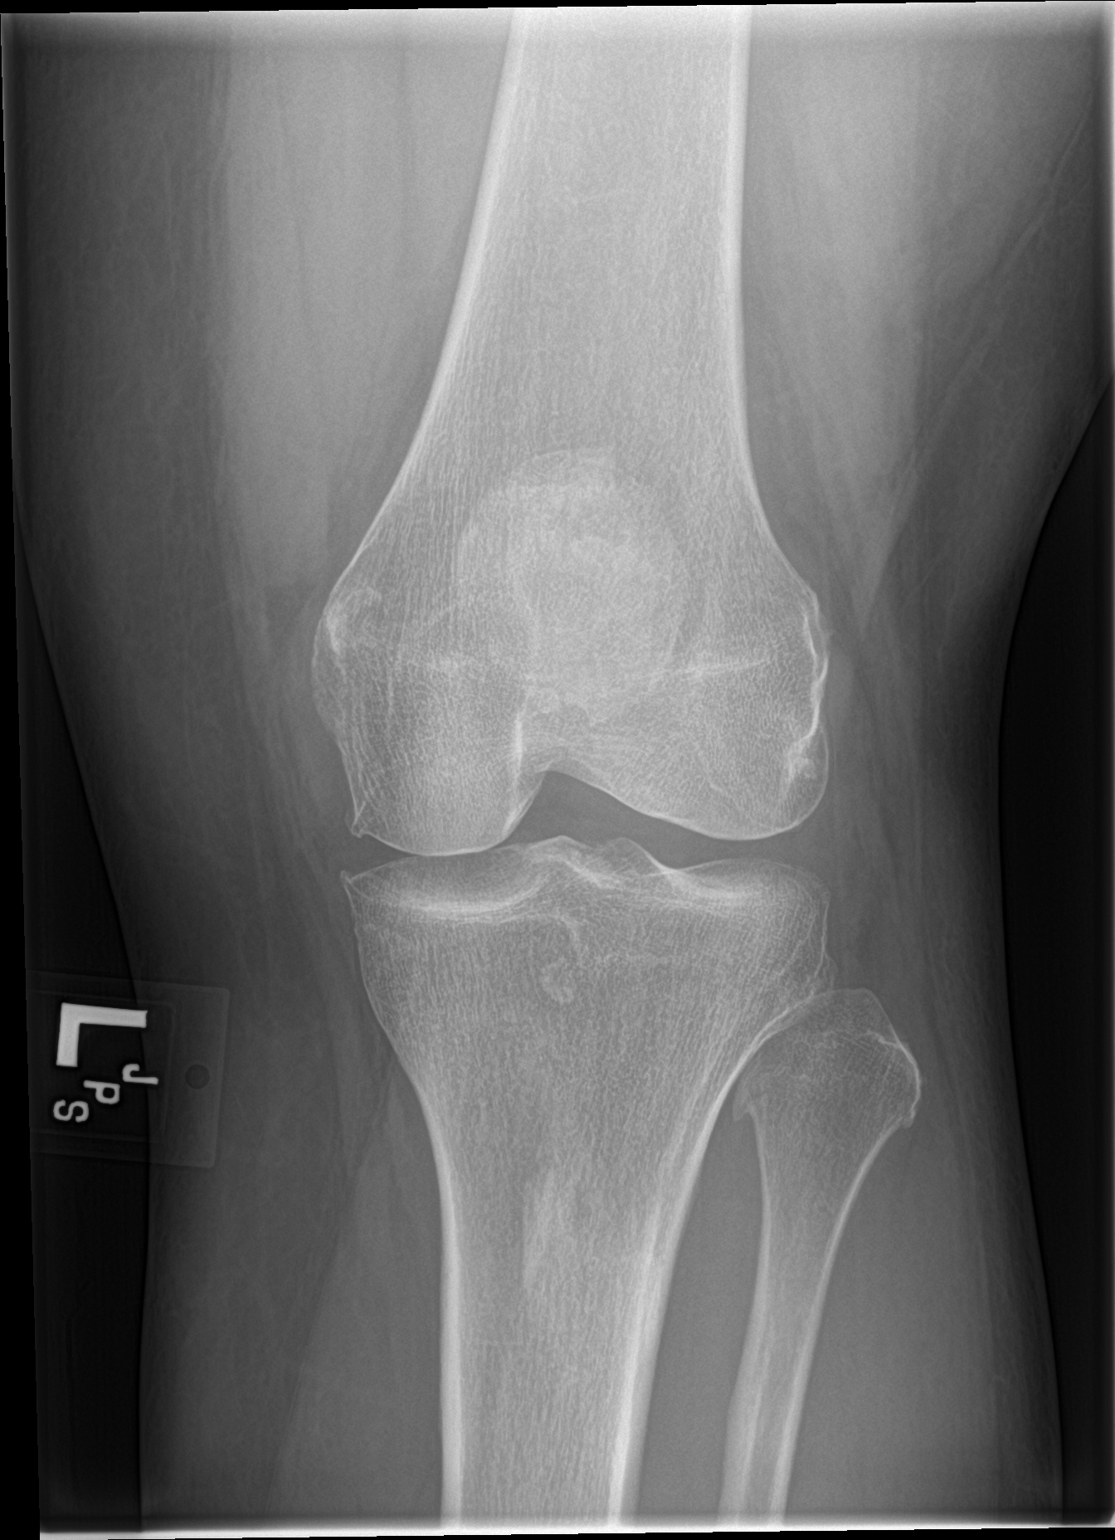

[knee lat]
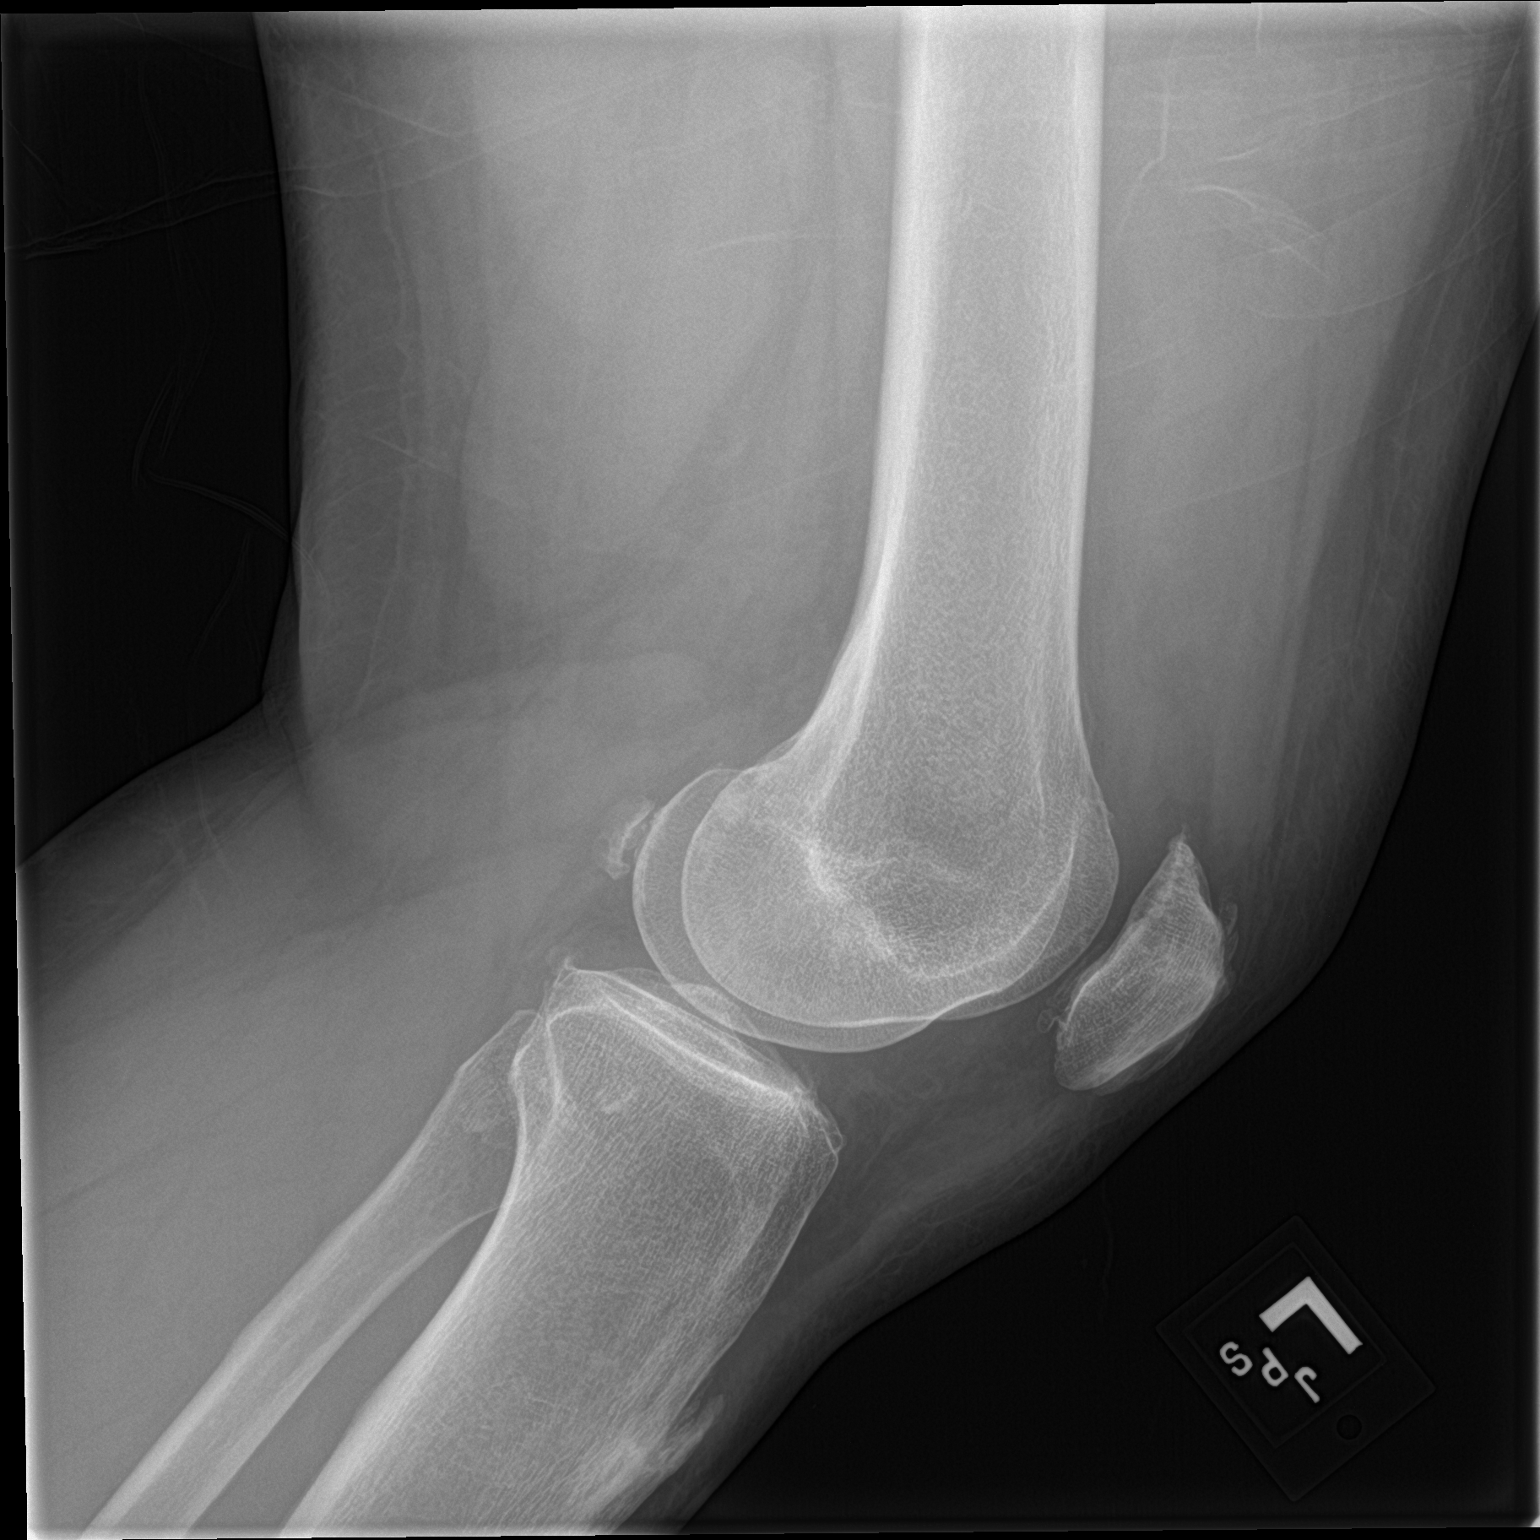

[2 of 2 positions shown; findings below may reference images not displayed]

FINDINGS: No acute fracture or dislocation. Moderate left knee joint effusion.
Mild degenerative changes of the ER and lateral compartments,
unchanged compared to prior. Superficial soft tissues are
unremarkable.
IMPRESSION: Moderate left knee joint effusion.

No acute osseous abnormality.
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
## 2021-05-20 NOTE — Discharge Instructions (Addendum)
We do not need to remove fluid from your legs today.  You may continue to treat your pain with ibuprofen.  Please keep your leg elevated and continue to ice the joint as needed.
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
## 2021-05-20 NOTE — ED Triage Notes (Signed)
Pt reports left knee pain since attending a trampoline park about 1.5 weeks ago and hitting it.

## 2021-06-23 ENCOUNTER — Emergency Department (HOSPITAL_COMMUNITY): Payer: 59

## 2021-06-23 ENCOUNTER — Encounter (HOSPITAL_COMMUNITY): Payer: Self-pay

## 2021-06-23 ENCOUNTER — Inpatient Hospital Stay (HOSPITAL_COMMUNITY)
Admission: EM | Admit: 2021-06-23 | Discharge: 2021-06-25 | DRG: 066 | Disposition: A | Discharge status: Home / Self Care | Payer: 59 | Attending: Internal Medicine | Admitting: Internal Medicine

## 2021-06-23 DIAGNOSIS — Z833 Family history of diabetes mellitus: Secondary | ICD-10-CM

## 2021-06-23 DIAGNOSIS — R531 Weakness: Secondary | ICD-10-CM

## 2021-06-23 DIAGNOSIS — Z8261 Family history of arthritis: Secondary | ICD-10-CM

## 2021-06-23 DIAGNOSIS — D509 Iron deficiency anemia, unspecified: Secondary | ICD-10-CM | POA: Diagnosis present

## 2021-06-23 DIAGNOSIS — E669 Obesity, unspecified: Secondary | ICD-10-CM | POA: Diagnosis present

## 2021-06-23 DIAGNOSIS — R220 Localized swelling, mass and lump, head: Secondary | ICD-10-CM | POA: Diagnosis not present

## 2021-06-23 DIAGNOSIS — T384X5A Adverse effect of oral contraceptives, initial encounter: Secondary | ICD-10-CM | POA: Diagnosis present

## 2021-06-23 DIAGNOSIS — G8324 Monoplegia of upper limb affecting left nondominant side: Secondary | ICD-10-CM | POA: Diagnosis present

## 2021-06-23 DIAGNOSIS — T505X5A Adverse effect of appetite depressants, initial encounter: Secondary | ICD-10-CM | POA: Diagnosis present

## 2021-06-23 DIAGNOSIS — I63411 Cerebral infarction due to embolism of right middle cerebral artery: Principal | ICD-10-CM | POA: Diagnosis present

## 2021-06-23 DIAGNOSIS — Z6831 Body mass index (BMI) 31.0-31.9, adult: Secondary | ICD-10-CM

## 2021-06-23 DIAGNOSIS — I639 Cerebral infarction, unspecified: Secondary | ICD-10-CM

## 2021-06-23 DIAGNOSIS — R0789 Other chest pain: Secondary | ICD-10-CM | POA: Diagnosis present

## 2021-06-23 DIAGNOSIS — Z20822 Contact with and (suspected) exposure to covid-19: Secondary | ICD-10-CM | POA: Diagnosis present

## 2021-06-23 DIAGNOSIS — Z87891 Personal history of nicotine dependence: Secondary | ICD-10-CM

## 2021-06-23 DIAGNOSIS — R7303 Prediabetes: Secondary | ICD-10-CM | POA: Diagnosis present

## 2021-06-23 DIAGNOSIS — T508X5A Adverse effect of diagnostic agents, initial encounter: Secondary | ICD-10-CM | POA: Diagnosis not present

## 2021-06-23 DIAGNOSIS — R42 Dizziness and giddiness: Secondary | ICD-10-CM

## 2021-06-23 DIAGNOSIS — E785 Hyperlipidemia, unspecified: Secondary | ICD-10-CM | POA: Diagnosis present

## 2021-06-23 DIAGNOSIS — Z885 Allergy status to narcotic agent status: Secondary | ICD-10-CM

## 2021-06-23 DIAGNOSIS — Z8249 Family history of ischemic heart disease and other diseases of the circulatory system: Secondary | ICD-10-CM

## 2021-06-23 DIAGNOSIS — R29700 NIHSS score 0: Secondary | ICD-10-CM | POA: Diagnosis present

## 2021-06-23 DIAGNOSIS — L298 Other pruritus: Secondary | ICD-10-CM | POA: Diagnosis not present

## 2021-06-23 DIAGNOSIS — G43109 Migraine with aura, not intractable, without status migrainosus: Secondary | ICD-10-CM

## 2021-06-23 DIAGNOSIS — G8321 Monoplegia of upper limb affecting right dominant side: Secondary | ICD-10-CM | POA: Diagnosis present

## 2021-06-23 DIAGNOSIS — I6381 Other cerebral infarction due to occlusion or stenosis of small artery: Secondary | ICD-10-CM | POA: Diagnosis present

## 2021-06-23 LAB — COMPREHENSIVE METABOLIC PANEL
ALT: 23 U/L (ref 0–44)
AST: 19 U/L (ref 15–41)
Albumin: 3.8 g/dL (ref 3.5–5.0)
Alkaline Phosphatase: 39 U/L (ref 38–126)
Anion gap: 4 — ABNORMAL LOW (ref 5–15)
BUN: 9 mg/dL (ref 6–20)
CO2: 24 mmol/L (ref 22–32)
Calcium: 9 mg/dL (ref 8.9–10.3)
Chloride: 106 mmol/L (ref 98–111)
Creatinine, Ser: 0.81 mg/dL (ref 0.44–1.00)
GFR, Estimated: >60 mL/min (ref >60)
Glucose, Bld: 126 mg/dL — ABNORMAL HIGH (ref 70–99)
Potassium: 3.8 mmol/L (ref 3.5–5.1)
Sodium: 134 mmol/L — ABNORMAL LOW (ref 135–145)
Total Bilirubin: 0.4 mg/dL (ref 0.3–1.2)
Total Protein: 7.7 g/dL (ref 6.5–8.1)

## 2021-06-23 LAB — DIFFERENTIAL
Abs Immature Granulocytes: 0.02 10*3/uL (ref 0.00–0.07)
Basophils Absolute: 0 10*3/uL (ref 0.0–0.1)
Basophils Relative: 1 %
Eosinophils Absolute: 0.1 10*3/uL (ref 0.0–0.5)
Eosinophils Relative: 2 %
Immature Granulocytes: 0 %
Lymphocytes Relative: 30 %
Lymphs Abs: 1.5 10*3/uL (ref 0.7–4.0)
Monocytes Absolute: 0.4 10*3/uL (ref 0.1–1.0)
Monocytes Relative: 9 %
Neutro Abs: 2.8 10*3/uL (ref 1.7–7.7)
Neutrophils Relative %: 58 %

## 2021-06-23 LAB — I-STAT CHEM 8, ED
BUN: 7 mg/dL (ref 6–20)
Calcium, Ion: 1.22 mmol/L (ref 1.15–1.40)
Chloride: 103 mmol/L (ref 98–111)
Creatinine, Ser: 0.8 mg/dL (ref 0.44–1.00)
Glucose, Bld: 125 mg/dL — ABNORMAL HIGH (ref 70–99)
HCT: 40 % (ref 36.0–46.0)
Hemoglobin: 13.6 g/dL (ref 12.0–15.0)
Potassium: 3.8 mmol/L (ref 3.5–5.1)
Sodium: 138 mmol/L (ref 135–145)
TCO2: 25 mmol/L (ref 22–32)

## 2021-06-23 LAB — RAPID URINE DRUG SCREEN, HOSP PERFORMED
Amphetamines: NOT DETECTED
Barbiturates: NOT DETECTED
Benzodiazepines: NOT DETECTED
Cocaine: NOT DETECTED
Opiates: NOT DETECTED
Tetrahydrocannabinol: NOT DETECTED

## 2021-06-23 LAB — PROTIME-INR
INR: 1 (ref 0.8–1.2)
Prothrombin Time: 12.7 seconds (ref 11.4–15.2)

## 2021-06-23 LAB — APTT: aPTT: 26 seconds (ref 24–36)

## 2021-06-23 LAB — ETHANOL: Alcohol, Ethyl (B): <10 mg/dL (ref <10)

## 2021-06-23 LAB — URINALYSIS, ROUTINE W REFLEX MICROSCOPIC
Bilirubin Urine: NEGATIVE
Glucose, UA: NEGATIVE mg/dL
Hgb urine dipstick: NEGATIVE
Ketones, ur: NEGATIVE mg/dL
Leukocytes,Ua: NEGATIVE
Nitrite: NEGATIVE
Protein, ur: NEGATIVE mg/dL
Specific Gravity, Urine: 1.01 (ref 1.005–1.030)
pH: 5 (ref 5.0–8.0)

## 2021-06-23 LAB — TROPONIN I (HIGH SENSITIVITY)
Troponin I (High Sensitivity): 6 ng/L (ref <18)
Troponin I (High Sensitivity): 6 ng/L (ref <18)

## 2021-06-23 LAB — RESP PANEL BY RT-PCR (FLU A&B, COVID) ARPGX2
Influenza A by PCR: NEGATIVE
Influenza B by PCR: NEGATIVE
SARS Coronavirus 2 by RT PCR: NEGATIVE

## 2021-06-23 LAB — I-STAT BETA HCG BLOOD, ED (MC, WL, AP ONLY): I-stat hCG, quantitative: <5 m[IU]/mL (ref <5)

## 2021-06-23 LAB — CBC
HCT: 39.7 % (ref 36.0–46.0)
Hemoglobin: 12.4 g/dL (ref 12.0–15.0)
MCH: 24.8 pg — ABNORMAL LOW (ref 26.0–34.0)
MCHC: 31.2 g/dL (ref 30.0–36.0)
MCV: 79.4 fL — ABNORMAL LOW (ref 80.0–100.0)
Platelets: 270 10*3/uL (ref 150–400)
RBC: 5 MIL/uL (ref 3.87–5.11)
RDW: 13.9 % (ref 11.5–15.5)
WBC: 4.9 10*3/uL (ref 4.0–10.5)
nRBC: 0 % (ref 0.0–0.2)

## 2021-06-23 IMAGING — MR MR MRA NECK W/O CM
12 series · 48 of 48 positions shown · non-contrast
Comparison: CT angiogram chest [DATE]. Concurrently performed
MRI brain and MRA head [DATE].

CLINICAL DATA: Stroke, follow-up. Additional history provided:
Lightheadedness, right arm weakness, chest pain and headache.

EXAM:
MRA NECK WITHOUT CONTRAST
TECHNIQUE: Angiographic images of the neck were acquired using MRA technique
without intravenous contrast. Carotid stenosis measurements (when
applicable) are obtained utilizing NASCET criteria, using the distal
internal carotid diameter as the denominator.

[Series 9: DWI · axial · 3.0mm · 1.36mm/px · z∈[-110,+59]mm · 7 of 116 slices shown (1 of 4)]
[im 1/116]
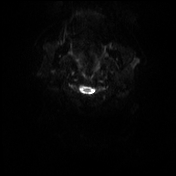
[im 20/116]
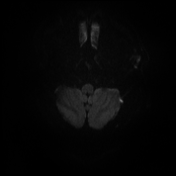
[im 39/116]
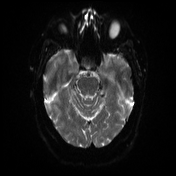
[im 58/116]
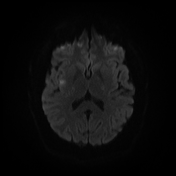
[im 77/116]
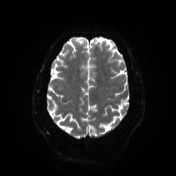
[im 96/116]
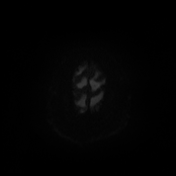
[im 116/116]
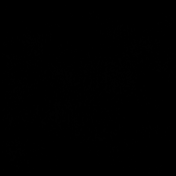

[Series 10: DWI · axial · 3.0mm · 1.36mm/px · z∈[-110,+50]mm · 3 of 55 slices shown (2 of 4)]
[im 1/55]
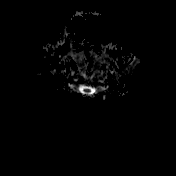
[im 28/55]
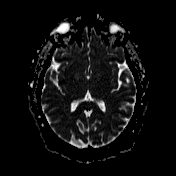
[im 55/55]
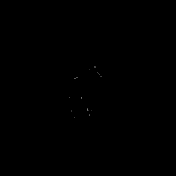

[Series 11: T1 · sagittal · 5.0mm · 0.75mm/px · 1 of 26 slices shown (1 of 2)]
[im 1/26]
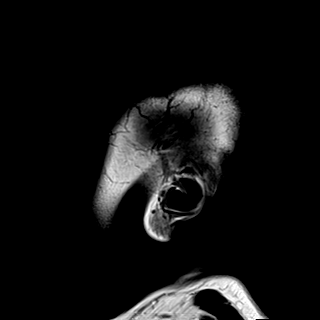

[Series 12: T2 · axial · 5.0mm · 0.62mm/px · z∈[-106,+55]mm · 2 of 26 slices shown (1 of 2)]
[im 1/26]
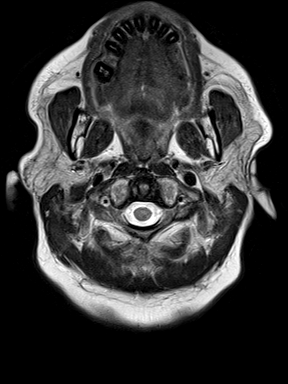
[im 26/26]
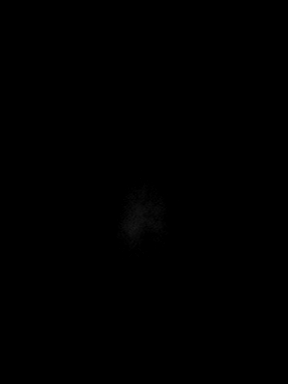

[Series 13: swi_images · axial · 3.0mm · 0.75mm/px · z∈[-107,+56]mm · 4 of 56 slices shown]
[im 1/56]
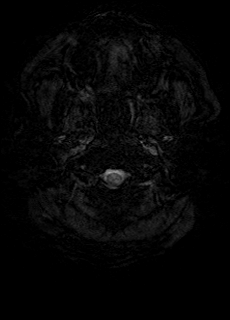
[im 19/56]
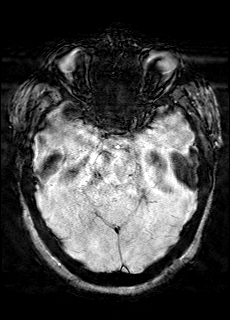
[im 37/56]
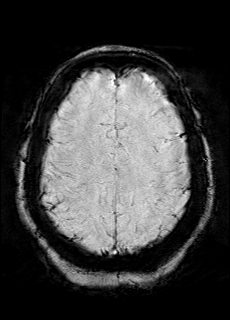
[im 56/56]
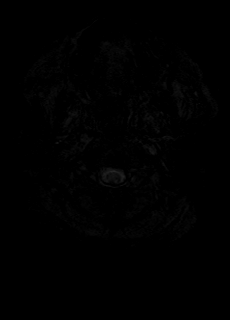

[Series 15: T1 · axial · 1.0mm · 0.94mm/px · z∈[-107,+50]mm · 11 of 160 slices shown (2 of 2)]
[im 1/160]
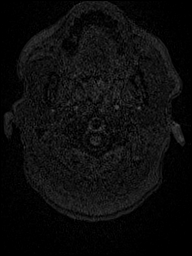
[im 16/160]
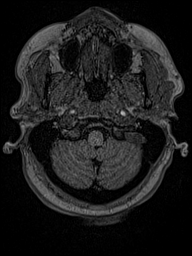
[im 32/160]
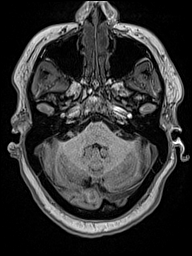
[im 48/160]
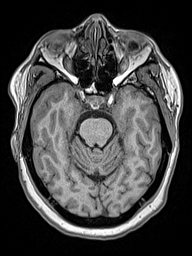
[im 64/160]
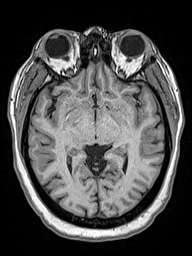
[im 80/160]
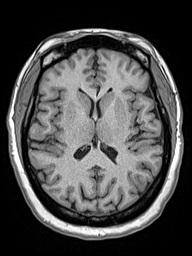
[im 96/160]
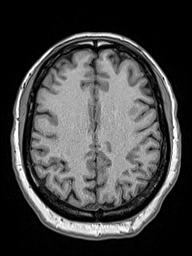
[im 112/160]
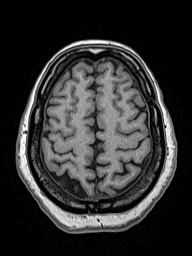
[im 128/160]
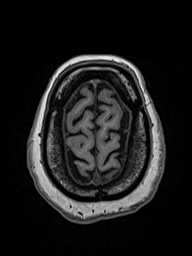
[im 144/160]
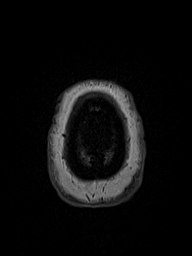
[im 160/160]
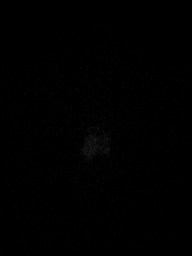

[Series 16: FLAIR · axial · 3.0mm · 0.75mm/px · z∈[-101,+51]mm · 3 of 52 slices shown]
[im 1/52]
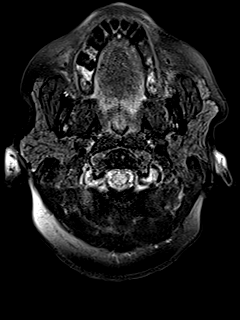
[im 26/52]
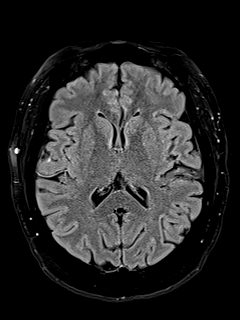
[im 52/52]
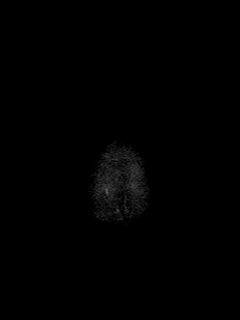

[Series 17: DWI · coronal · 5.0mm · 1.31mm/px · 5 of 76 slices shown (3 of 4)]
[im 1/76]
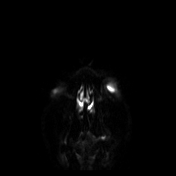
[im 19/76]
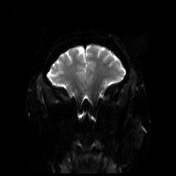
[im 38/76]
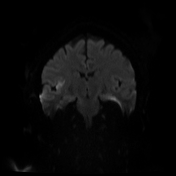
[im 57/76]
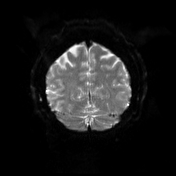
[im 76/76]
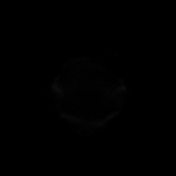

[Series 18: DWI · coronal · 5.0mm · 1.31mm/px · 3 of 38 slices shown (4 of 4)]
[im 1/38]
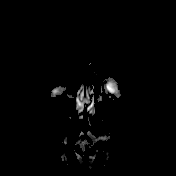
[im 19/38]
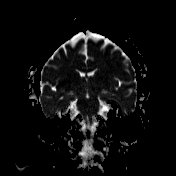
[im 38/38]
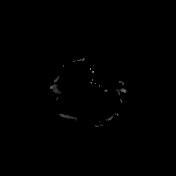

[Series 19: T2 · coronal · 5.0mm · 0.57mm/px · 2 of 29 slices shown (2 of 2)]
[im 1/29]
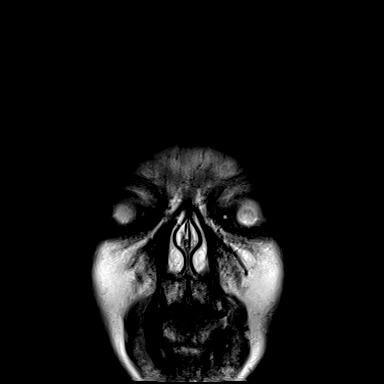
[im 29/29]
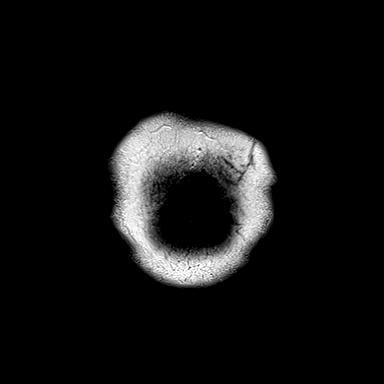

[Series 29: tof_2d_tra · axial · 3.5mm · 0.43mm/px · z∈[-254,-54]mm · 6 of 85 slices shown]
[im 1/85]
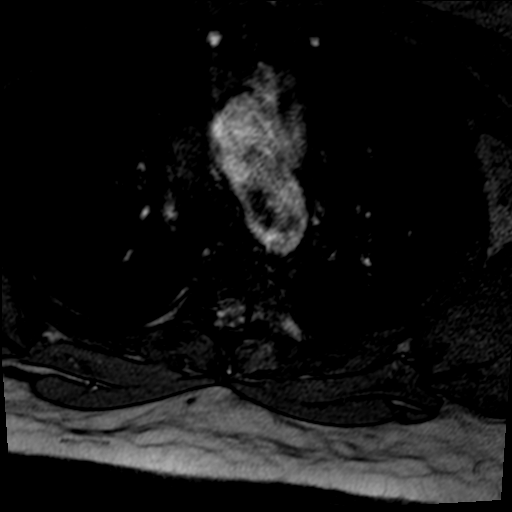
[im 17/85]
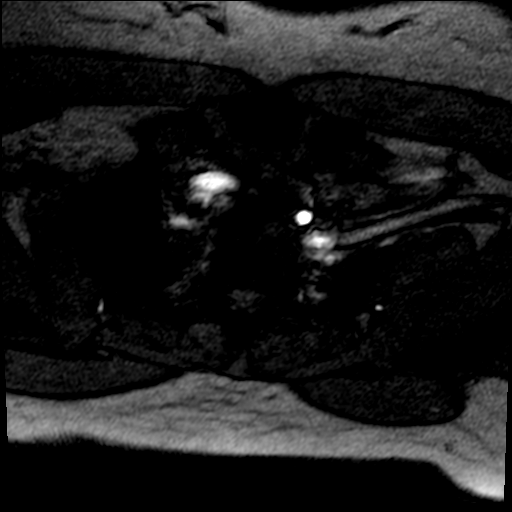
[im 34/85]
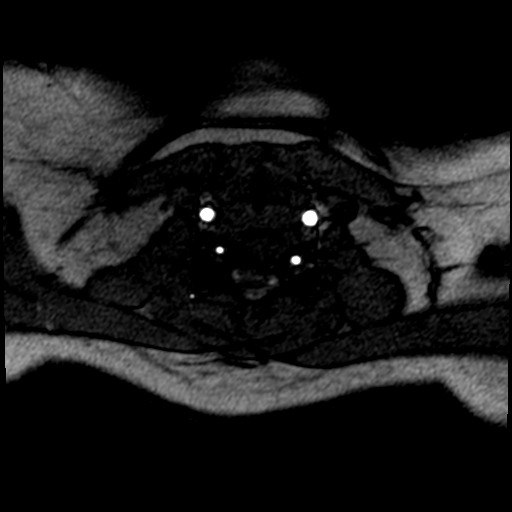
[im 51/85]
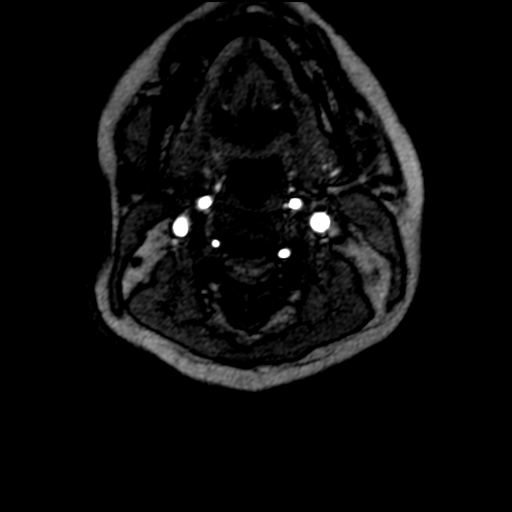
[im 68/85]
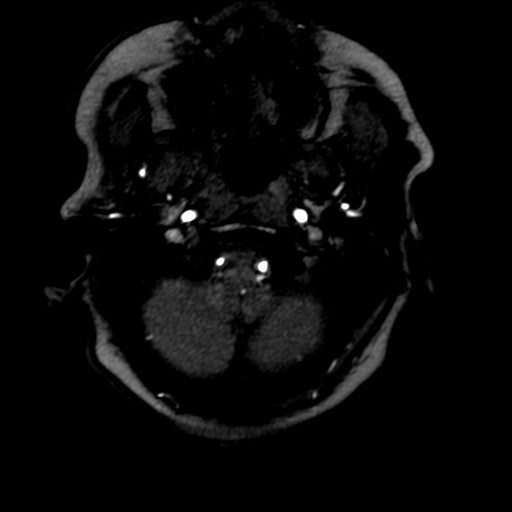
[im 85/85]
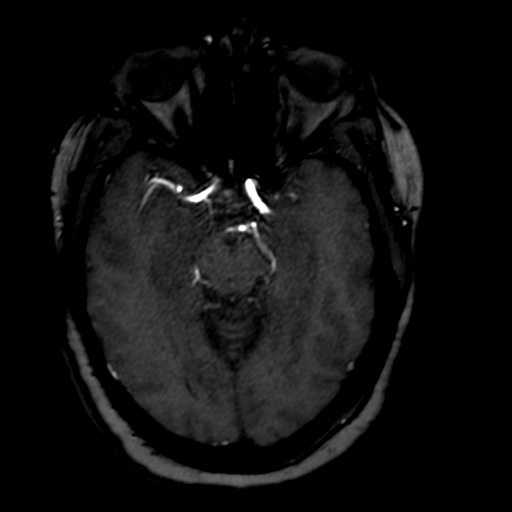

[Series 33: tof_2d_tra_mip_tra · axial · 209.3mm · 0.43mm/px · 1 of 1 slices shown]
[im 1/1]
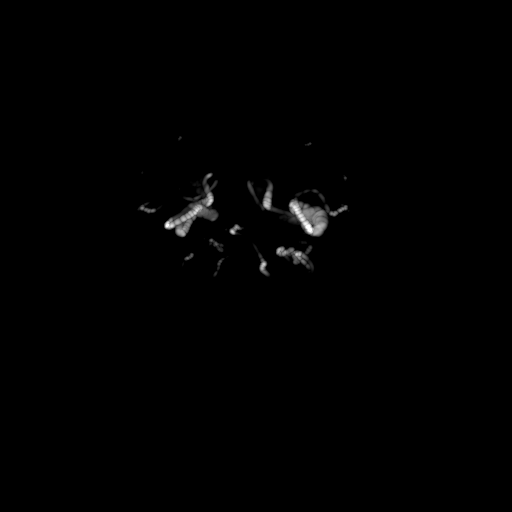

[48 of 48 positions shown; findings below may reference images not displayed]

FINDINGS: Motion degradation at the level of the lower neck/upper chest.

Standard aortic branching.

The common carotid and internal carotid arteries are patent within
the neck without hemodynamically significant stenosis (50% or
greater).

The vertebral arteries are patent within the neck. The left
vertebral artery is dominant. Motion degradation precludes
evaluation for stenosis at the origin of the non-dominant right
vertebral artery. No appreciable hemodynamically significant
stenosis elsewhere within the cervical vertebral arteries.
IMPRESSION: The common carotid and internal carotid arteries are patent within
the neck without hemodynamically significant stenosis.

The vertebral arteries are patent within the neck. Motion
degradation precludes evaluation for stenosis at the origin of the
non-dominant right vertebral artery. No appreciable hemodynamically
significant stenosis elsewhere within the cervical vertebral
arteries.

## 2021-06-23 IMAGING — CT CT HEAD CODE STROKE
3 series · 16 of 47 positions shown, 19 images · non-contrast
Comparison: [DATE]

CLINICAL DATA: Code stroke. Near syncope yesterday. Right arm
numbness today.

EXAM:
CT HEAD WITHOUT CONTRAST
TECHNIQUE: Contiguous axial images were obtained from the base of the skull
through the vertex without intravenous contrast.

[Series 3: head wo · axial · 0.47mm/px · z∈[-67,+83]mm · 10 of 36 slices shown, 13 images]
[im 3/36  brain]
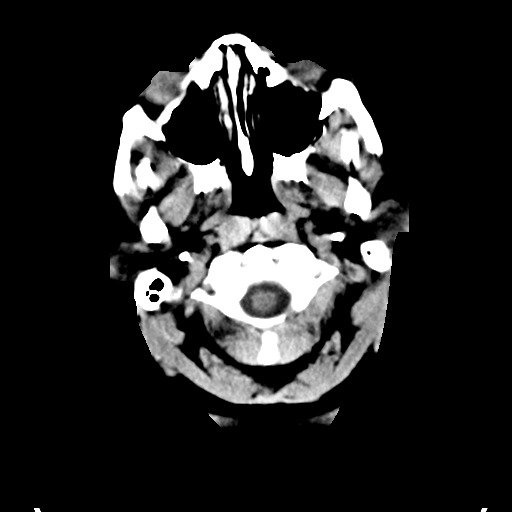
[im 3/36  bone]
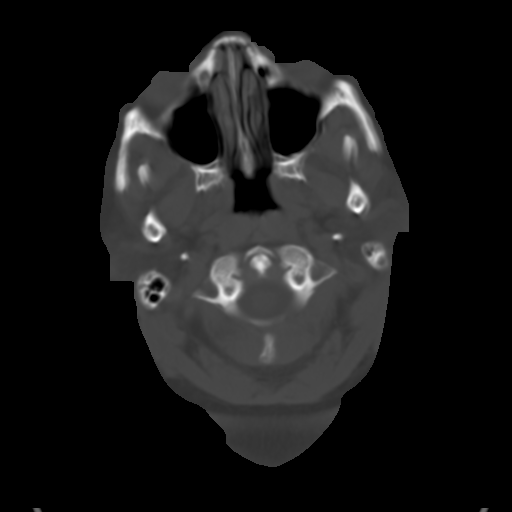
[im 7/36  brain]
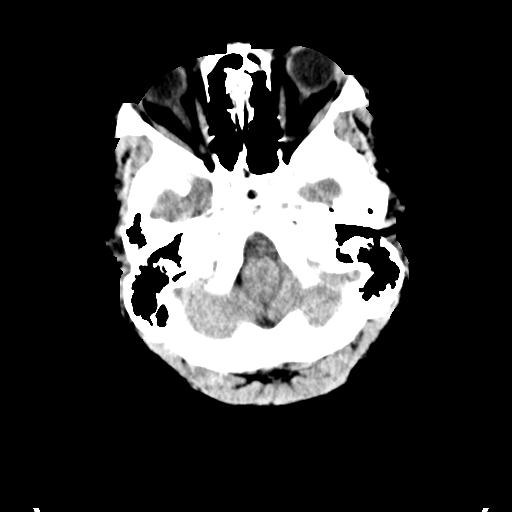
[im 10/36  brain]
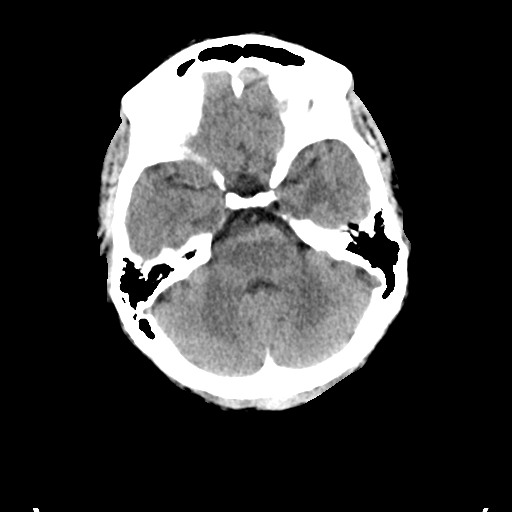
[im 13/36  brain]
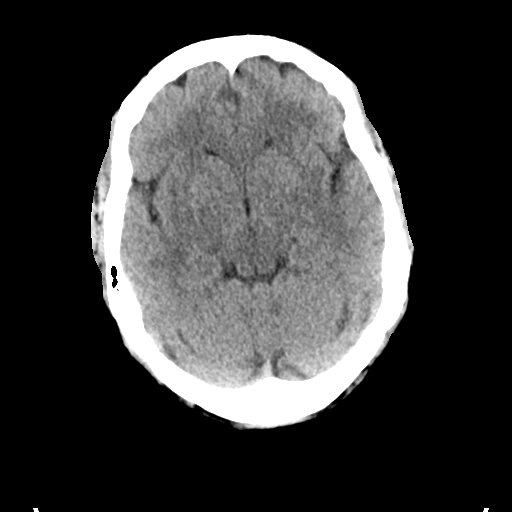
[im 16/36  brain]
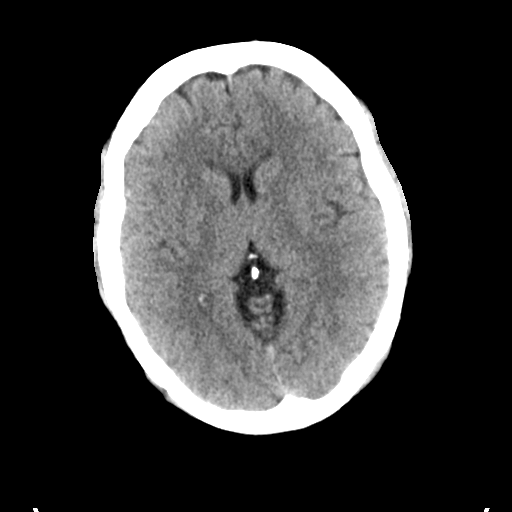
[im 16/36  bone]
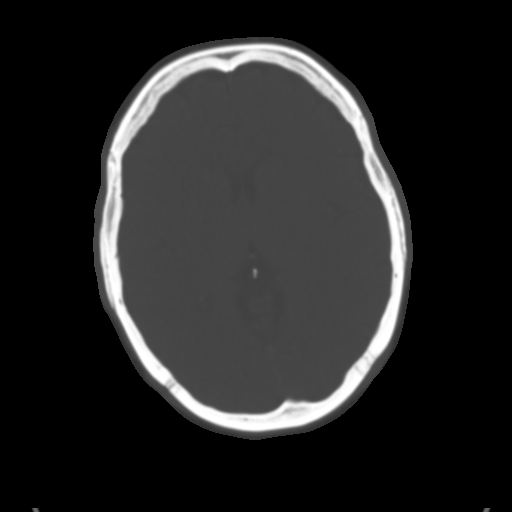
[im 20/36  brain]
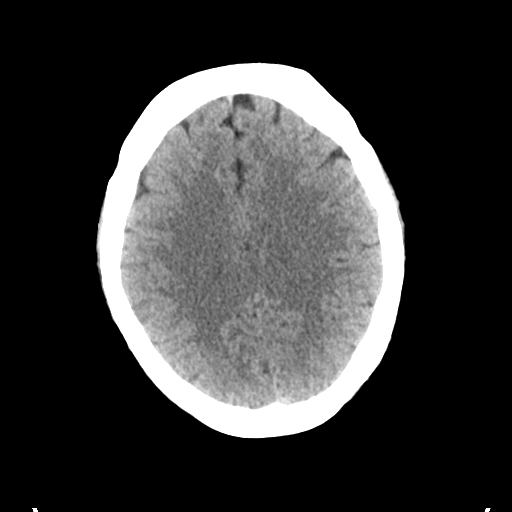
[im 23/36  brain]
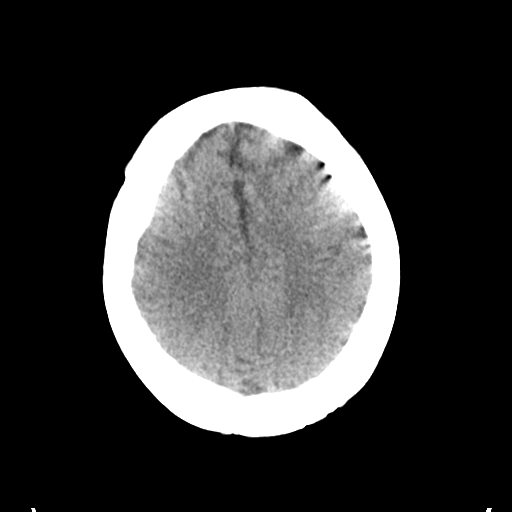
[im 27/36  brain]
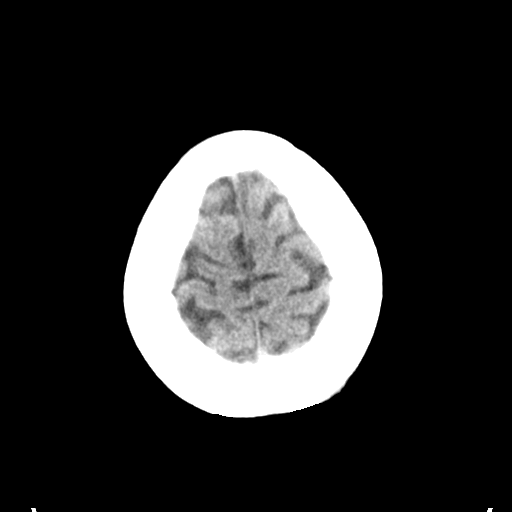
[im 29/36  brain]
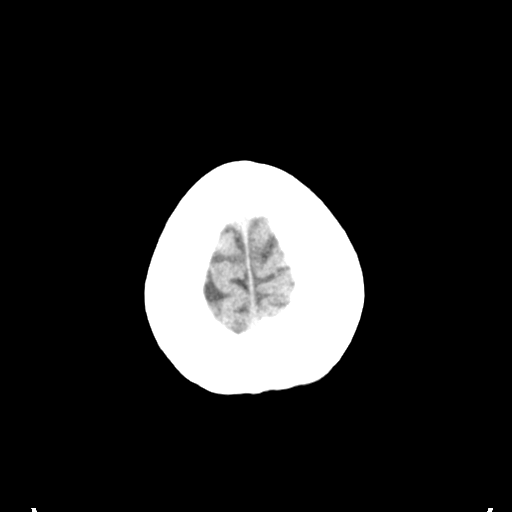
[im 29/36  bone]
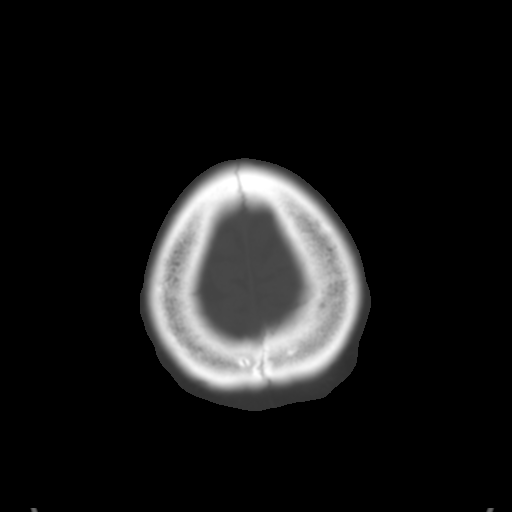
[im 33/36  brain]
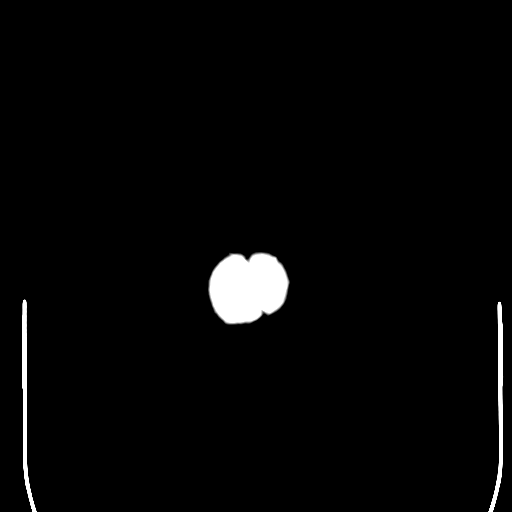

[Series 6: coronal soft tissue · coronal · 0.34mm/px · 3 of 69 slices shown]
[im 23/69  brain]
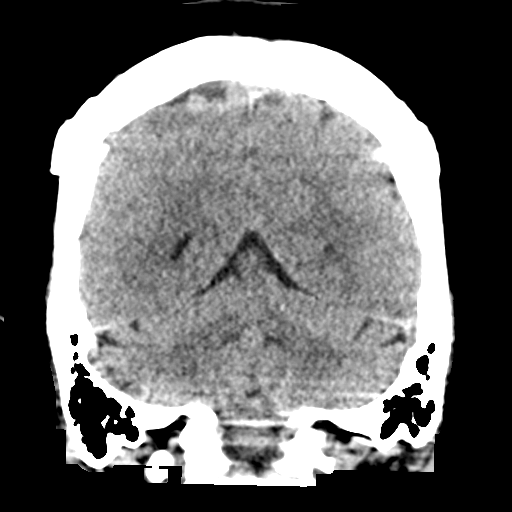
[im 31/69  brain]
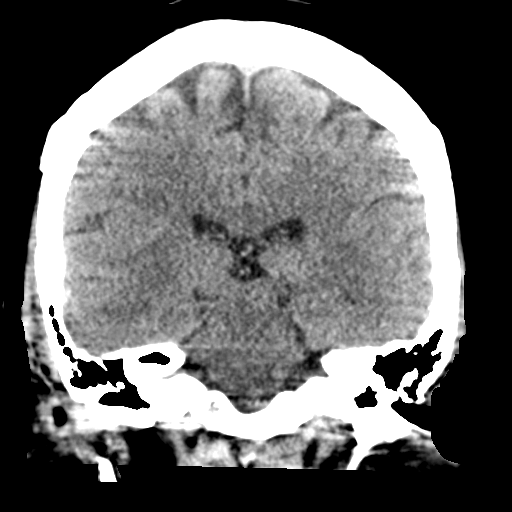
[im 38/69  brain]
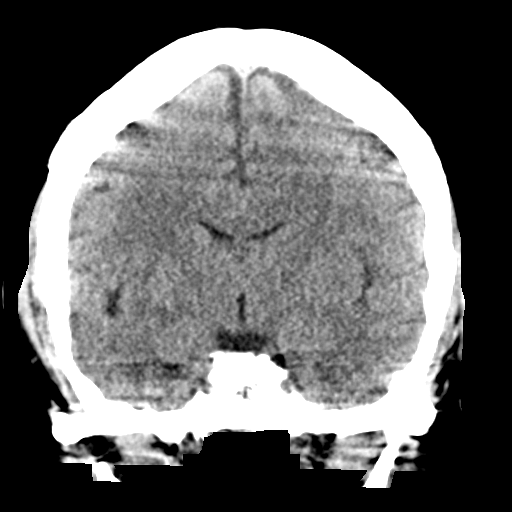

[Series 7: sagittal soft tissue · sagittal · 0.34mm/px · 3 of 59 slices shown]
[im 20/59  brain]
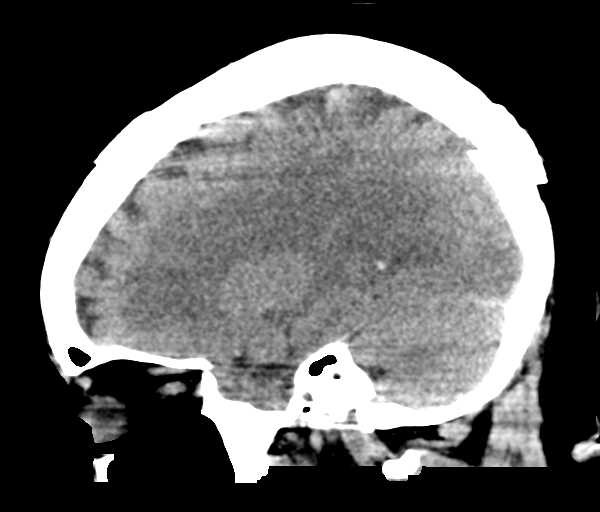
[im 30/59  brain]
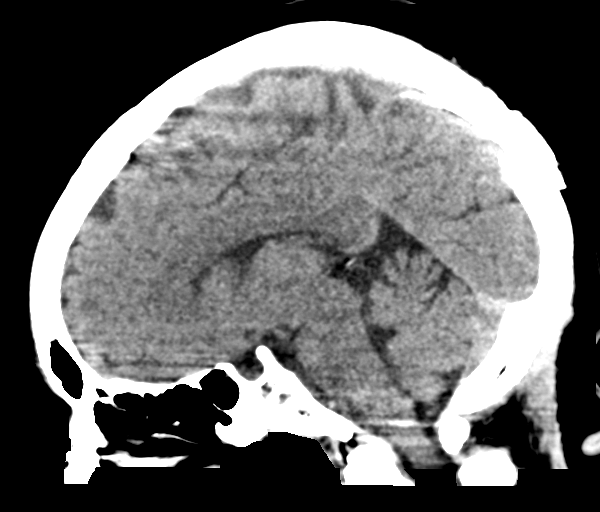
[im 39/59  brain]
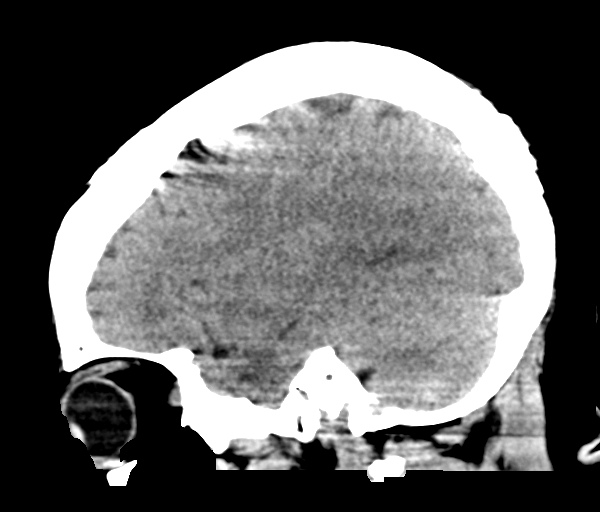

[16 of 47 positions shown; findings below may reference images not displayed]

FINDINGS: Brain: No evidence of acute infarction, hemorrhage, hydrocephalus,
extra-axial collection or mass lesion/mass effect.

Vascular: No hyperdense vessel or unexpected calcification.

Skull: Normal. Negative for fracture or focal lesion.

Sinuses/Orbits: No acute finding.

Other: These results were communicated to [REDACTED] at [DATE] on
[DATE] by text page via the [employer] messaging system. Motion
artifact words the vertex.

ASPECTS (Alberta Stroke Program Early CT Score)

- Ganglionic level infarction (caudate, lentiform nuclei, internal
capsule, insula, M1-M3 cortex): 7

- Supraganglionic infarction (M4-M6 cortex): 3

Total score (0-10 with 10 being normal): 10
IMPRESSION: No acute finding.

## 2021-06-23 IMAGING — CT CT ANGIO NECK
2 of 7 series · 8 of 33 positions shown · IV contrast (omnipaque)
Comparison: Correlation made with recent MR imaging

CLINICAL DATA: Neuro deficit, acute, stroke suspected; right arm
weakness, acute stroke on MRI

EXAM:
CT ANGIOGRAPHY HEAD AND NECK
TECHNIQUE: Multidetector CT imaging of the head and neck was performed using
the standard protocol during bolus administration of intravenous
contrast. Multiplanar CT image reconstructions and MIPs were
obtained to evaluate the vascular anatomy. Carotid stenosis
measurements (when applicable) are obtained utilizing NASCET
criteria, using the distal internal carotid diameter as the
denominator.
CONTRAST:  75mL OMNIPAQUE IOHEXOL 350 MG/ML SOLN

[Series 5: cta head neck · axial · 0.48mm/px · z∈[-169,-61]mm · 2 of 163 slices shown]
[im 55/163  soft-tissue]
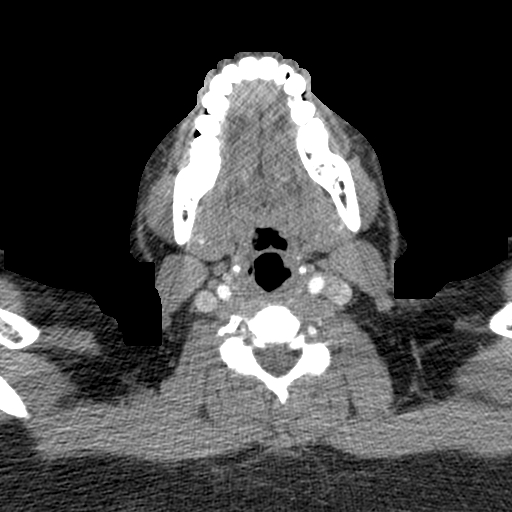
[im 109/163  soft-tissue]
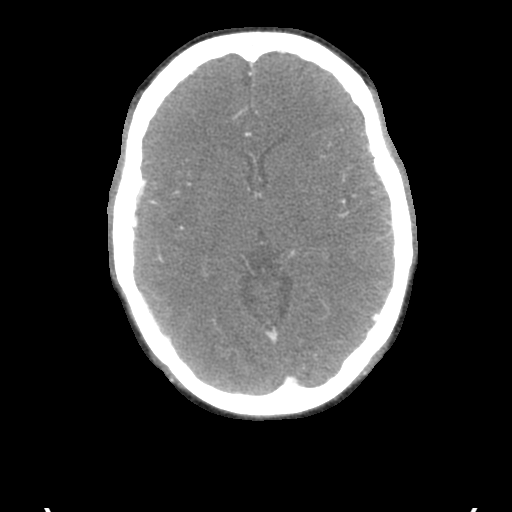

[Series 7: ax thin · axial · 0.33mm/px · z∈[-231,-6]mm · 6 of 317 slices shown]
[im 46/317  soft-tissue]
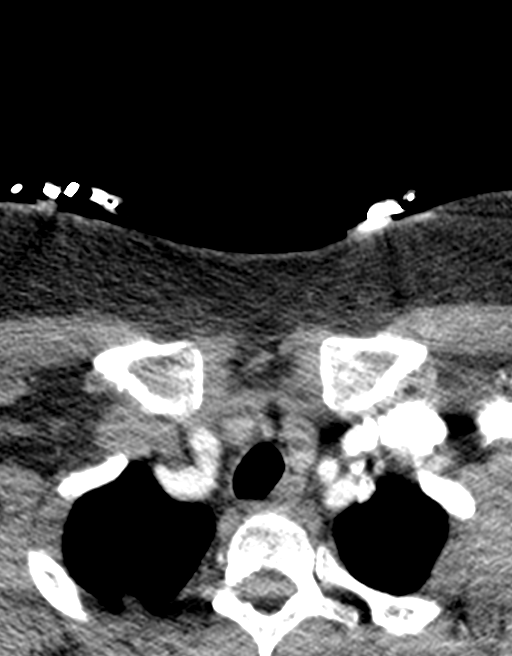
[im 91/317  bone]
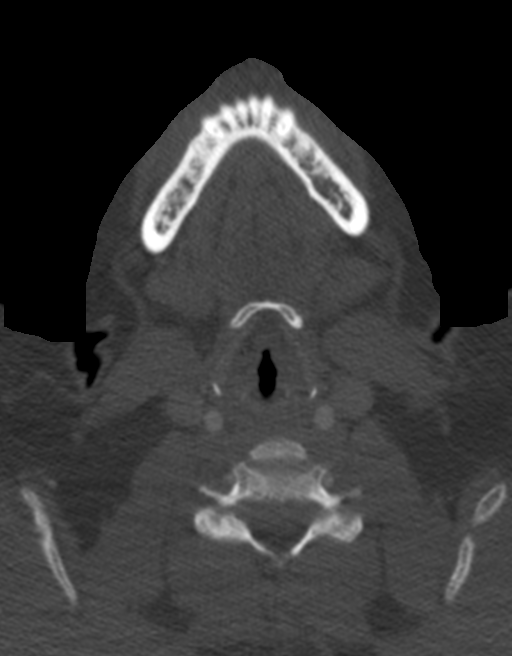
[im 136/317  soft-tissue]
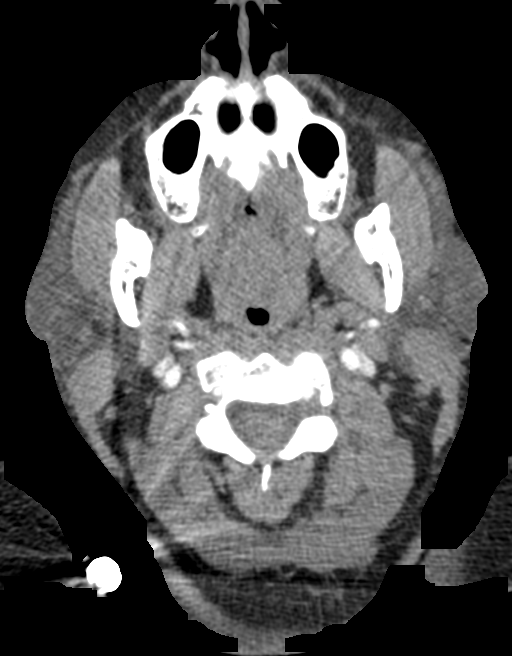
[im 181/317  bone]
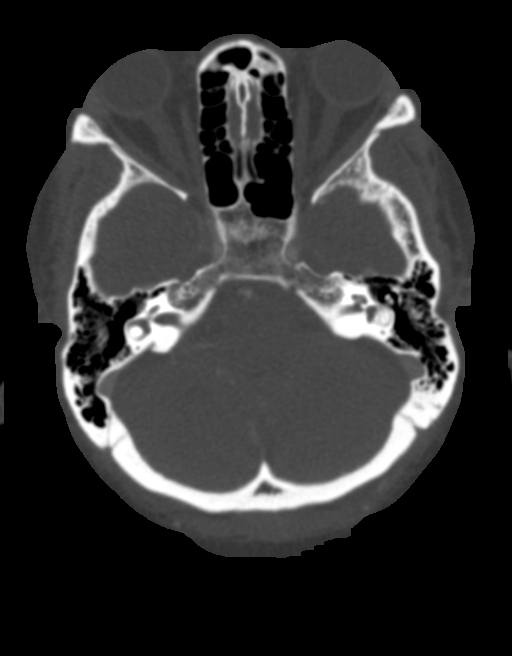
[im 226/317  soft-tissue]
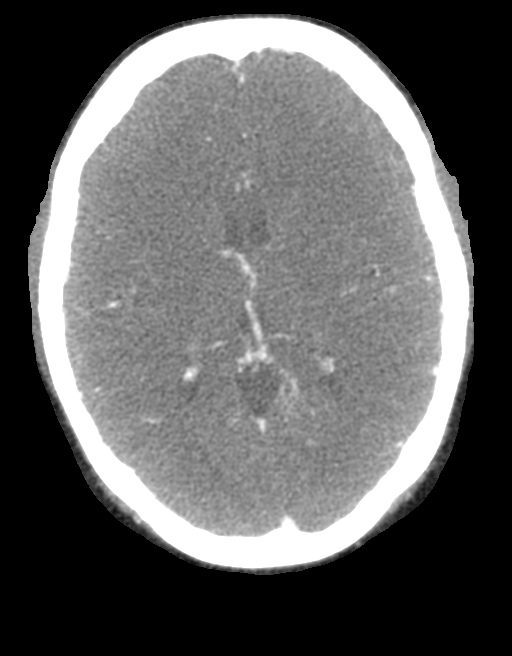
[im 271/317  bone]
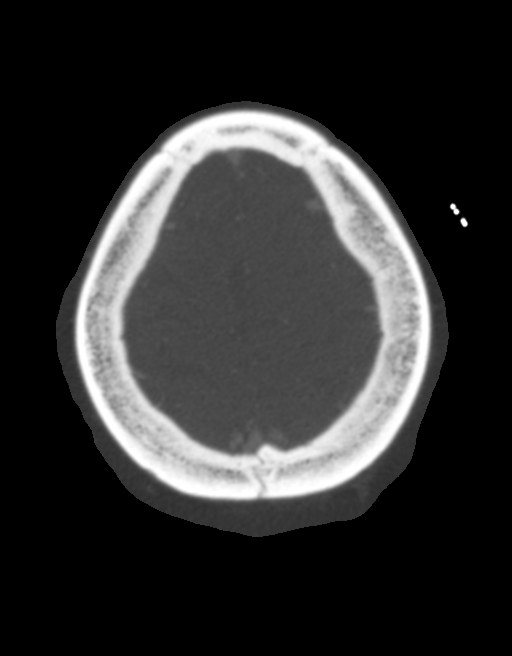

[8 of 33 positions shown; findings below may reference images not displayed]

FINDINGS: CTA NECK

Aortic arch: Great vessel origins are patent.

Right carotid system: Patent.  No stenosis.

Left carotid system: Patent.  No stenosis.

Vertebral arteries: Patent. Left vertebral artery is dominant. No
stenosis.

Skeleton: Facet predominant cervical spine degenerative changes.

Other neck: Unremarkable.

Upper chest: There is a 2 mm nodule of the right upper lobe (series
5, image 12).

Review of the MIP images confirms the above findings

CTA HEAD

Anterior circulation: Intracranial internal carotid arteries are
patent. Anterior cerebral arteries are patent. Left A1 ACA is
dominant. Anterior communicating artery is present. Middle cerebral
arteries are patent. Probable stenosis or occlusion of small mid
right M2 MCA branch in the region of insular infarct on MRI. This
corresponds to questioned abnormality on MRA.

Posterior circulation: Intracranial vertebral arteries are patent.
Basilar artery is patent. Major cerebellar artery origins are
patent. Bilateral posterior communicating arteries are present.
Posterior cerebral arteries are patent.

Venous sinuses: Patent as allowed by contrast bolus timing.

Review of the MIP images confirms the above findings
IMPRESSION: No large vessel occlusion. No hemodynamically significant stenosis
in the neck.

Probable stenosis or occlusion of a small mid right M2 MCA branch in
the region of insular infarct on MRI. This may correspond to
abnormality seen on MRA.

2 mm right upper lobe nodule. No follow-up needed if patient is
low-risk. Non-contrast chest CT can be considered in 12 months if
patient is high-risk. This recommendation follows the consensus
statement: Guidelines for Management of Incidental Pulmonary Nodules
Detected on CT Images: From the [HOSPITAL] [61]; Radiology

## 2021-06-23 IMAGING — CR DG CHEST 2V
2 series · 2 of 2 positions shown · non-contrast
Comparison: Chest x-ray [DATE]

CLINICAL DATA: Chest pain

EXAM:
CHEST - 2 VIEW

[w chest pa]
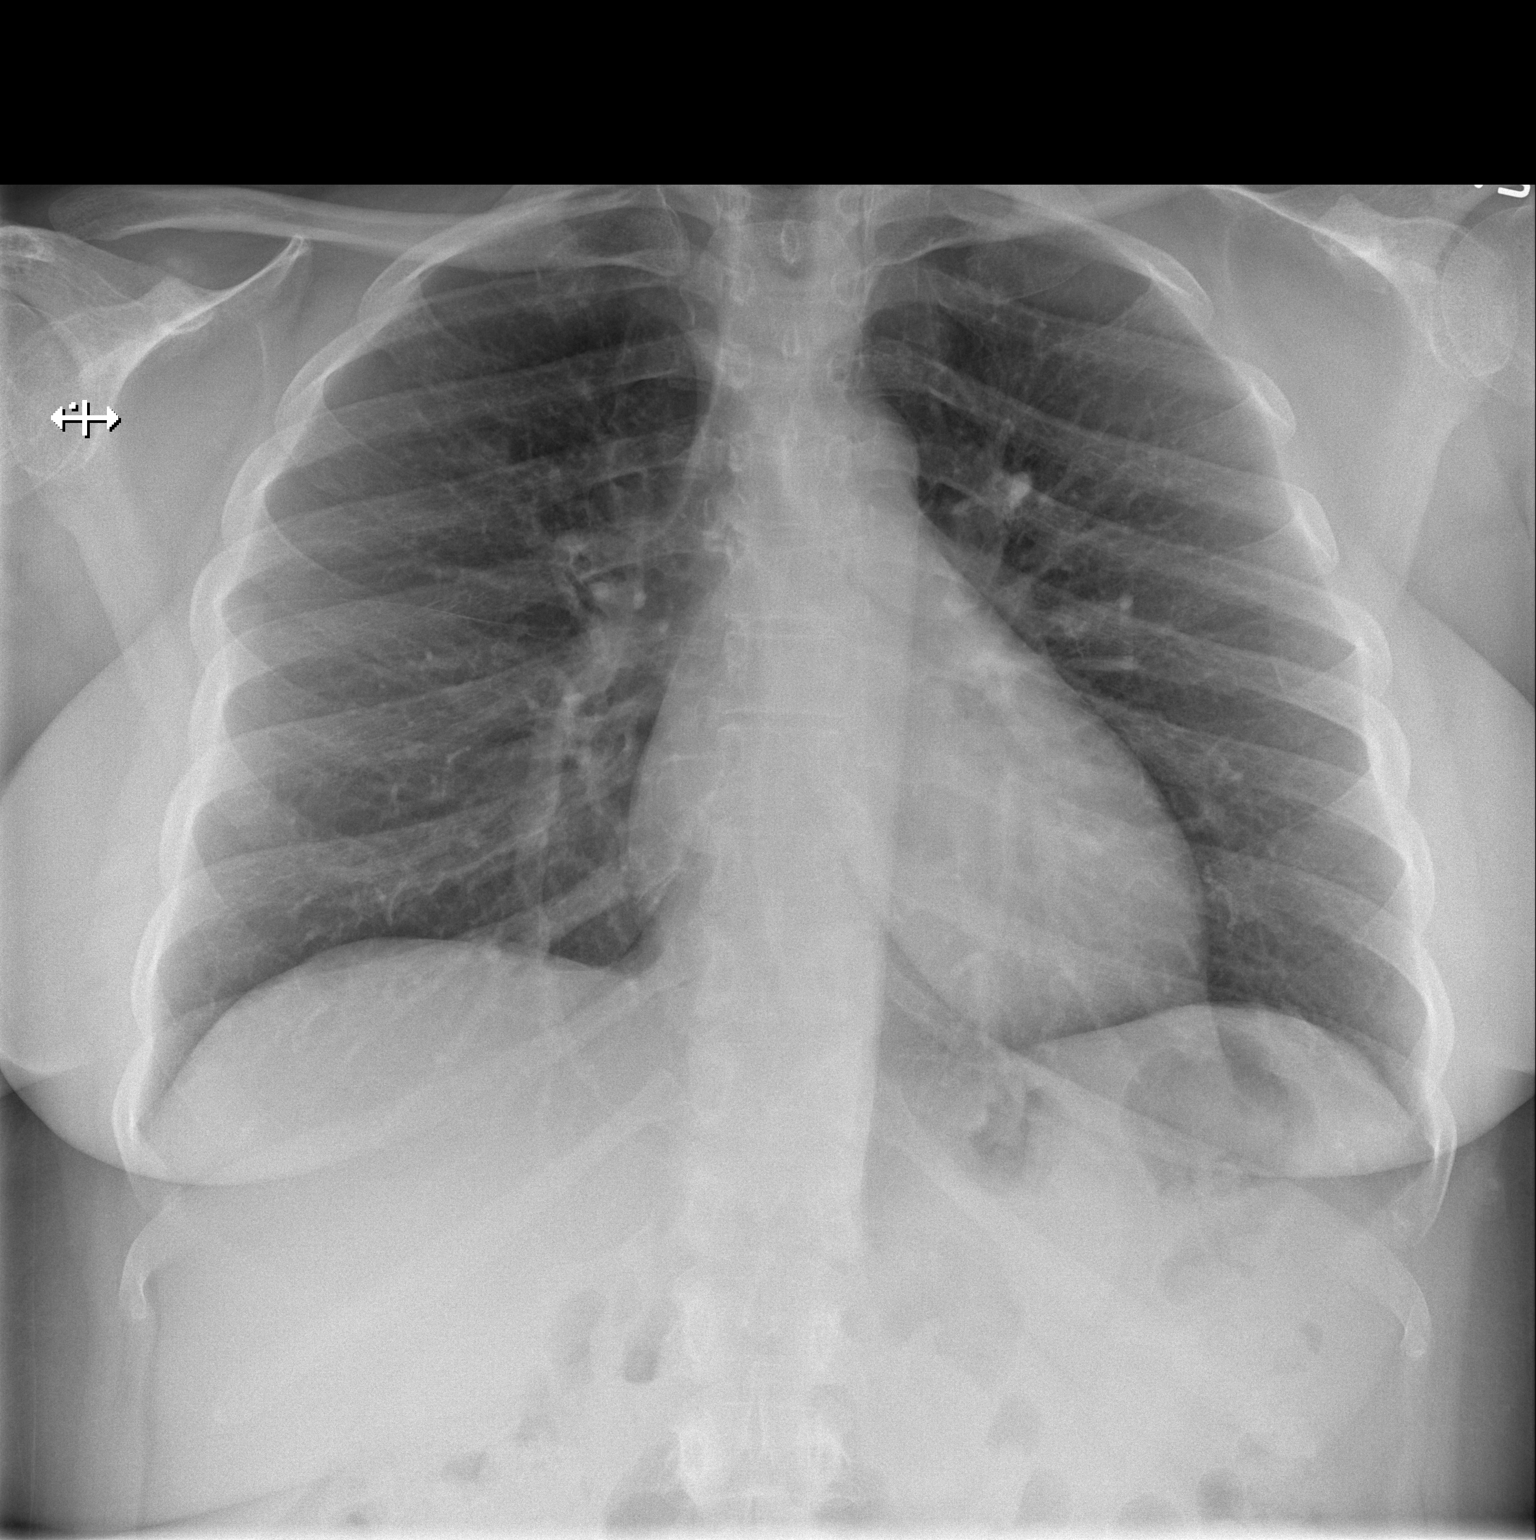

[w chest lat]
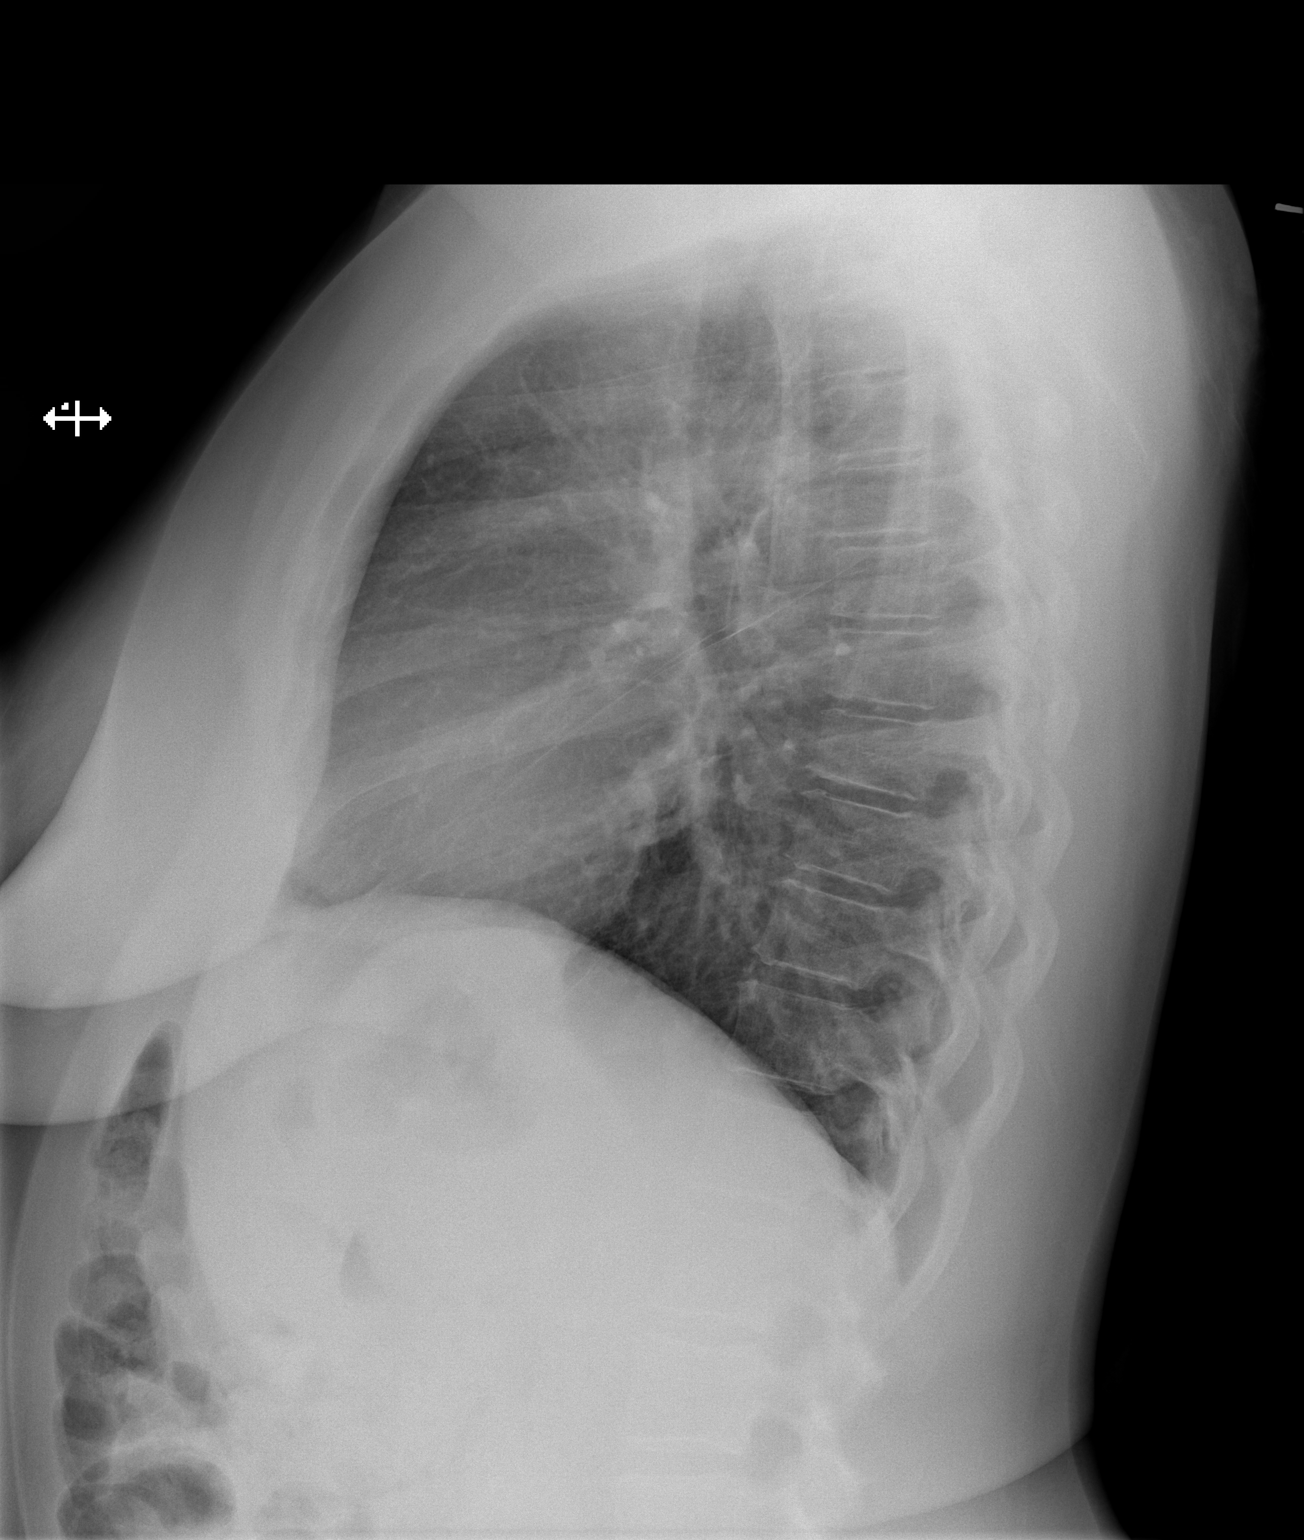

[2 of 2 positions shown; findings below may reference images not displayed]

FINDINGS: Heart size and mediastinal contours are within normal limits. No
suspicious pulmonary opacities identified.

No pleural effusion or pneumothorax visualized.

No acute osseous abnormality appreciated.
IMPRESSION: No acute intrathoracic process identified.

## 2021-06-23 IMAGING — MR MR MRA HEAD W/O CM
13 of 14 series · 38 of 48 positions shown · non-contrast
Comparison: Head CT [DATE].

CLINICAL DATA: Stroke, follow-up. Additional history provided:
Lightheadedness, right arm weakness, chest pain, headache.

EXAM:
MRI HEAD WITHOUT CONTRAST
MRA HEAD WITHOUT CONTRAST
TECHNIQUE: Multiplanar, multi-echo pulse sequences of the brain and surrounding
structures were acquired without intravenous contrast. Angiographic
images of the Circle of Willis were acquired using MRA technique
without intravenous contrast.

[Series 9: DWI · axial · 3.0mm · 1.36mm/px · z∈[-110,+59]mm · 5 of 116 slices shown (1 of 4)]
[im 1/116]
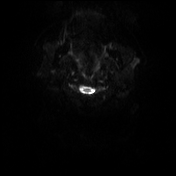
[im 29/116]
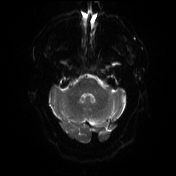
[im 58/116]
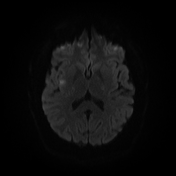
[im 87/116]
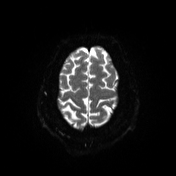
[im 116/116]
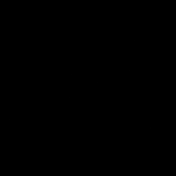

[Series 10: DWI · axial · 3.0mm · 1.36mm/px · z∈[-110,+50]mm · 2 of 55 slices shown (2 of 4)]
[im 1/55]
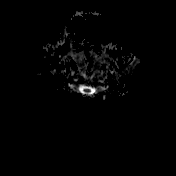
[im 55/55]
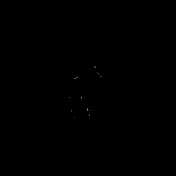

[Series 11: T1 · sagittal · 5.0mm · 0.75mm/px · 1 of 26 slices shown (1 of 2)]
[im 1/26]
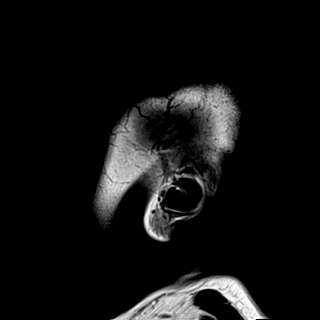

[Series 12: T2 · axial · 5.0mm · 0.62mm/px · 1 of 26 slices shown (1 of 2)]
[im 1/26]
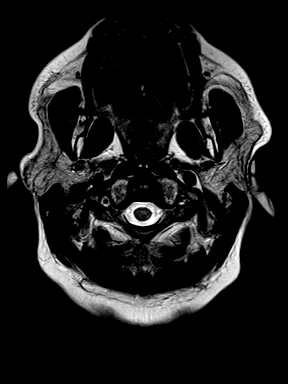

[Series 13: swi_images · axial · 3.0mm · 0.75mm/px · z∈[-107,+56]mm · 3 of 56 slices shown]
[im 1/56]
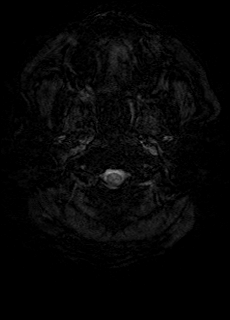
[im 28/56]
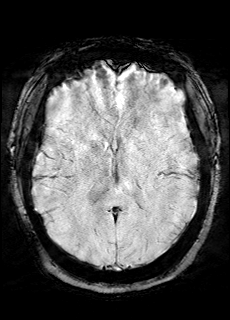
[im 56/56]
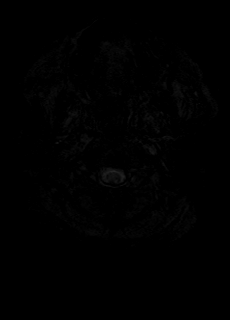

[Series 15: T1 · axial · 1.0mm · 0.94mm/px · z∈[-107,+50]mm · 8 of 160 slices shown (2 of 2)]
[im 1/160]
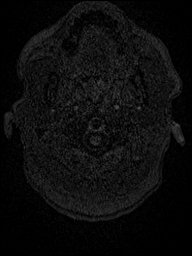
[im 23/160]
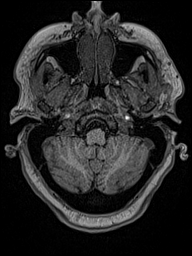
[im 46/160]
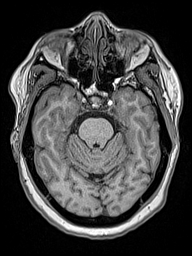
[im 69/160]
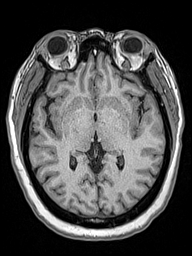
[im 91/160]
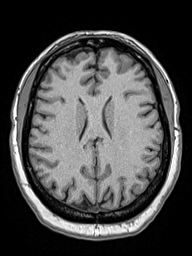
[im 114/160]
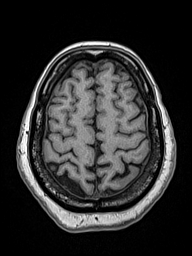
[im 137/160]
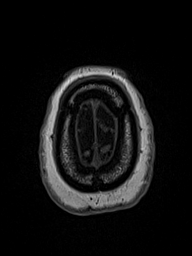
[im 160/160]
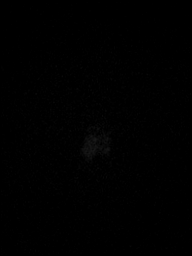

[Series 16: FLAIR · axial · 3.0mm · 0.75mm/px · z∈[-101,+51]mm · 3 of 52 slices shown]
[im 1/52]
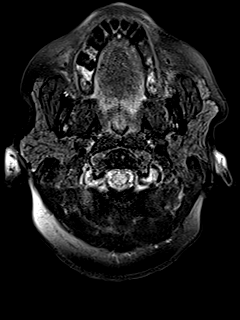
[im 26/52]
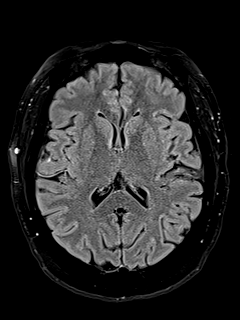
[im 52/52]
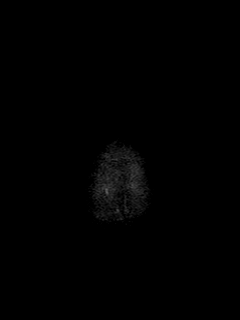

[Series 17: DWI · coronal · 5.0mm · 1.31mm/px · 4 of 76 slices shown (3 of 4)]
[im 1/76]
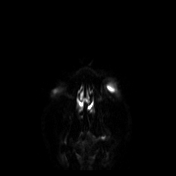
[im 26/76]
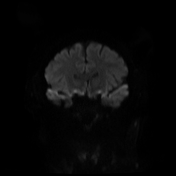
[im 51/76]
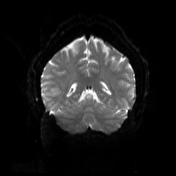
[im 76/76]
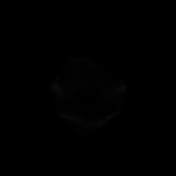

[Series 18: DWI · coronal · 5.0mm · 1.31mm/px · 2 of 38 slices shown (4 of 4)]
[im 1/38]
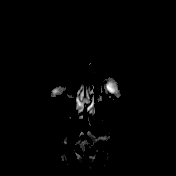
[im 38/38]
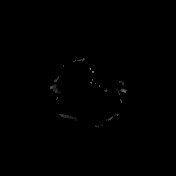

[Series 19: T2 · coronal · 5.0mm · 0.57mm/px · 1 of 29 slices shown (2 of 2)]
[im 1/29]
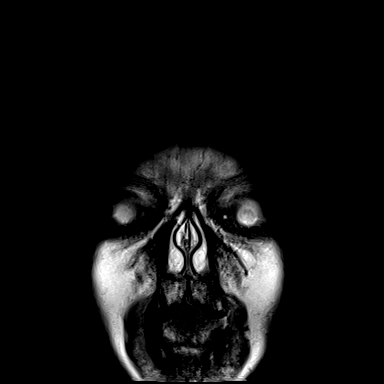

[Series 29: tof_2d_tra · axial · 3.5mm · 0.43mm/px · z∈[-254,-54]mm · 4 of 85 slices shown]
[im 1/85]
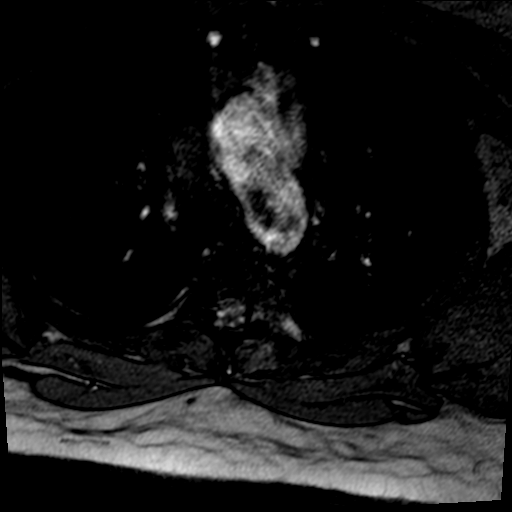
[im 29/85]
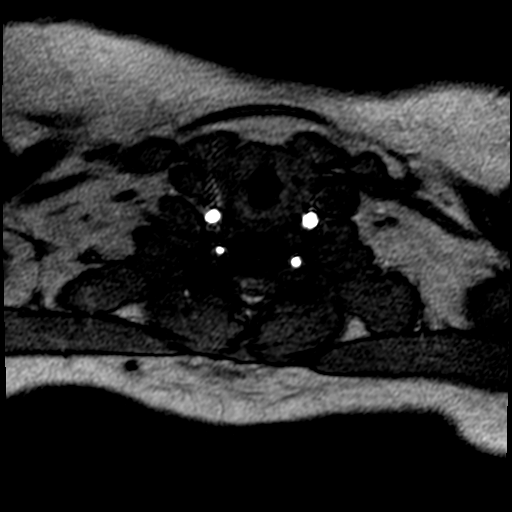
[im 57/85]
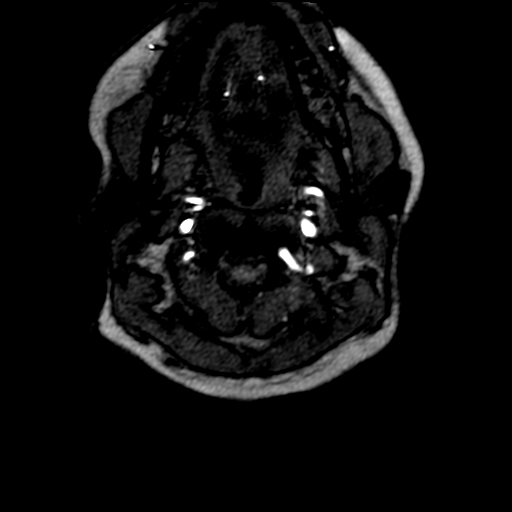
[im 85/85]
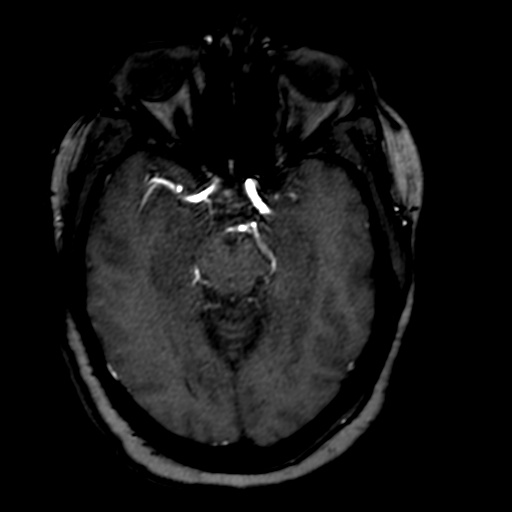

[Series 33: tof_2d_tra_mip_tra · axial · 209.3mm · 0.43mm/px · 1 of 1 slices shown]
[im 1/1]
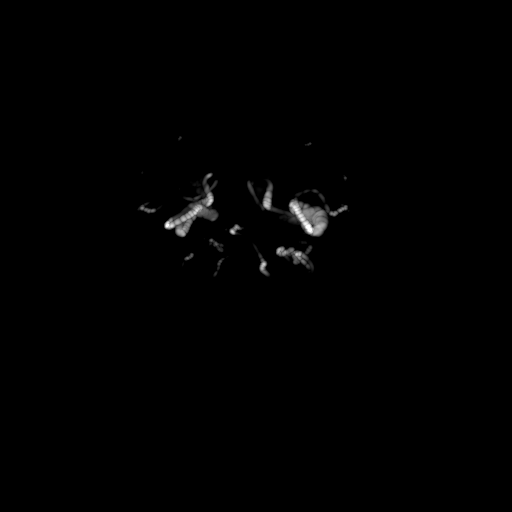

[Series 34: tof_3d_multi-slab-bifurcation · axial · 1.0mm · 0.26mm/px · z∈[-192,-142]mm · 3 of 104 slices shown]
[im 1/104]
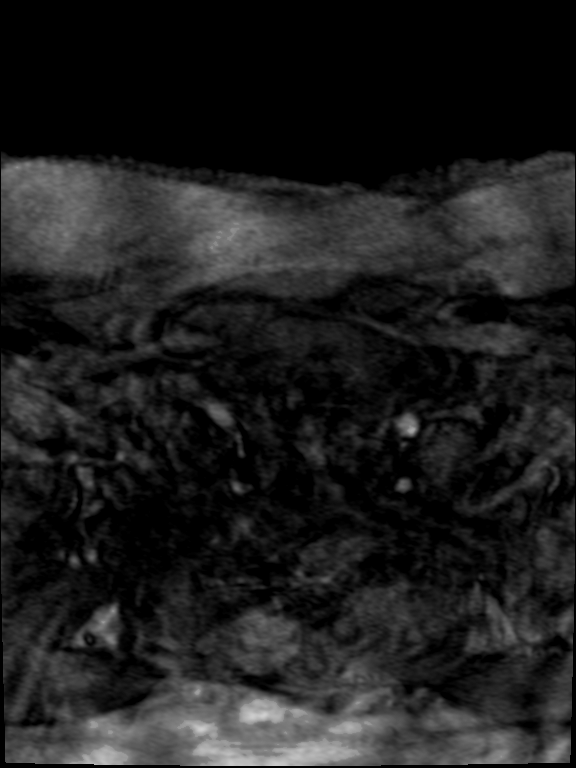
[im 26/104]
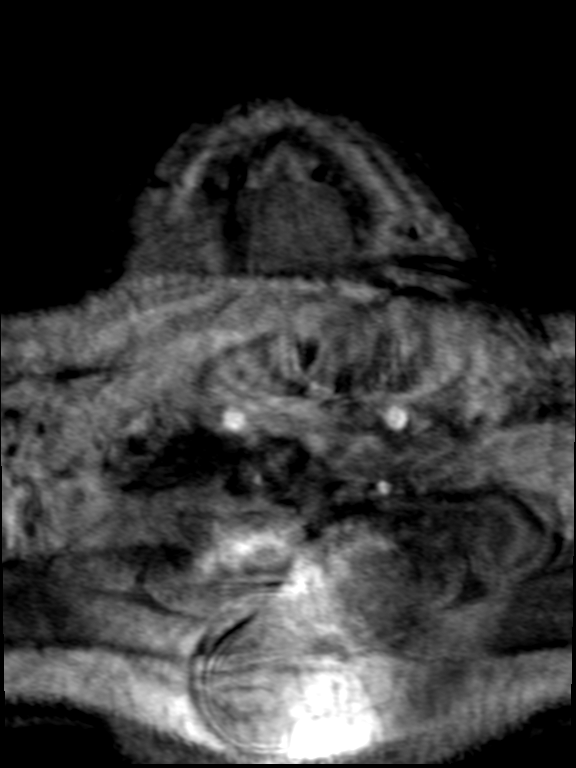
[im 52/104]
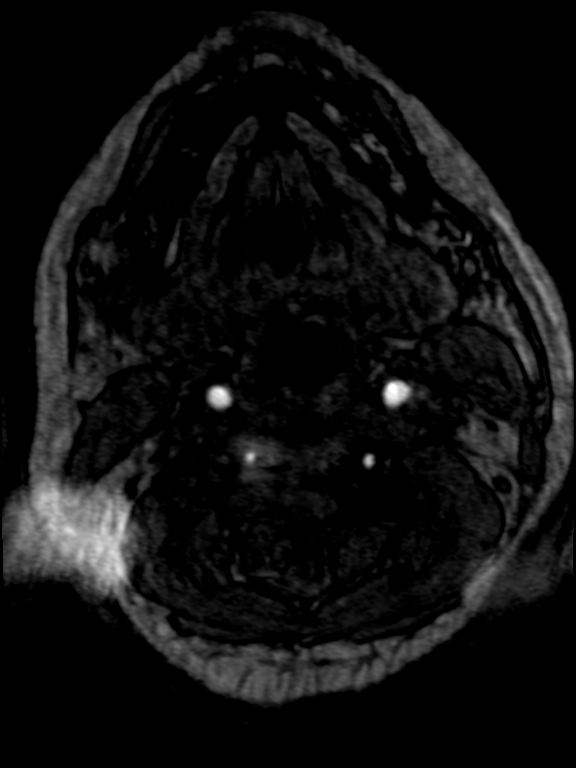

[38 of 48 positions shown; findings below may reference images not displayed]

FINDINGS: MRI HEAD FINDINGS

Brain:

Mild intermittent motion degradation.

Cerebral volume is normal.

Acute infarct within the right corona radiata/basal ganglia and
right insula measuring 3.0 X 1.0 cm.

Additional 2 mm acute cortical infarct within the high posterior
right frontal lobe (series 9, image 99).

A subtle punctate acute infarct is also questioned within the high
posterior left frontal lobe (versus artifact) (for instance as seen
on series 9, image 98) (series 17, image 57).

No evidence of an intracranial mass.

No chronic intracranial blood products.

No extra-axial fluid collection.

No midline shift.

Vascular: T2 FLAIR hyperintense signal abnormality within right M2
MCA vessels within the right sylvian fissure, which may reflect
thrombus or slow flow within these vessels.

Skull and upper cervical spine: Flow voids otherwise maintained
within the proximal large arterial vessels.

Sinuses/Orbits: Visualized orbits show no acute finding. No
significant paranasal sinus disease.

MRA HEAD FINDINGS

Moderately motion degraded exam.

Anterior circulation:

The intracranial internal carotid arteries are patent. The M1 middle
cerebral arteries are patent. Occlusion versus severe,
near-occlusive stenosis of a proximal-to-mid right M2 MCA vessel
(series 23, image 70) (series 102, image 17). No left M2 proximal
branch occlusion or severe proximal stenosis is identified. The
anterior cerebral arteries are patent. Developmentally hypoplastic
right A1 segment with superimposed moderate focal stenosis. Within
the limitations of motion degradation, no intracranial aneurysm is
identified.

Posterior circulation:

The intracranial vertebral arteries are patent. Dominant left
vertebral artery. The basilar artery is patent. The posterior
cerebral arteries are patent. Fetal origin right posterior cerebral
artery. A left posterior communicating artery is also present.

Anatomic variants: As described.

MRI brain impressions #2 and #3 and MRA head impression #2 called by
telephone at the time of interpretation on [DATE] at [DATE] to
provider Dr. D-GERARD, who verbally acknowledged these results.
IMPRESSION: MRI brain:

1. Mildly motion degraded exam.
2. Acute infarct within the right corona radiata/basal ganglia and
right insula measuring 3.0 x 1.0 cm.
3. Additional 2 mm acute cortical infarct within the high posterior
right frontal lobe (also within the right MCA vascular territory).
4. Subtle punctate acute infarct (versus artifact) within the high
posterior left frontal lobe.

MRA head:

1. Moderately motion degraded exam.
2. Occlusion versus severe, near-occlusive stenosis of a
proximal-to-mid right M2 MCA vessel.
3. Moderate focal stenosis within a developmentally hypoplastic
right A1 segment.

## 2021-06-23 IMAGING — CT CT ANGIO HEAD
2 of 7 series · 8 of 33 positions shown · IV contrast (omnipaque)
Comparison: Correlation made with recent MR imaging

CLINICAL DATA: Neuro deficit, acute, stroke suspected; right arm
weakness, acute stroke on MRI

EXAM:
CT ANGIOGRAPHY HEAD AND NECK
TECHNIQUE: Multidetector CT imaging of the head and neck was performed using
the standard protocol during bolus administration of intravenous
contrast. Multiplanar CT image reconstructions and MIPs were
obtained to evaluate the vascular anatomy. Carotid stenosis
measurements (when applicable) are obtained utilizing NASCET
criteria, using the distal internal carotid diameter as the
denominator.
CONTRAST:  75mL OMNIPAQUE IOHEXOL 350 MG/ML SOLN

[Series 5: cta head neck · axial · 0.48mm/px · z∈[-169,-61]mm · 2 of 163 slices shown]
[im 55/163  soft-tissue]
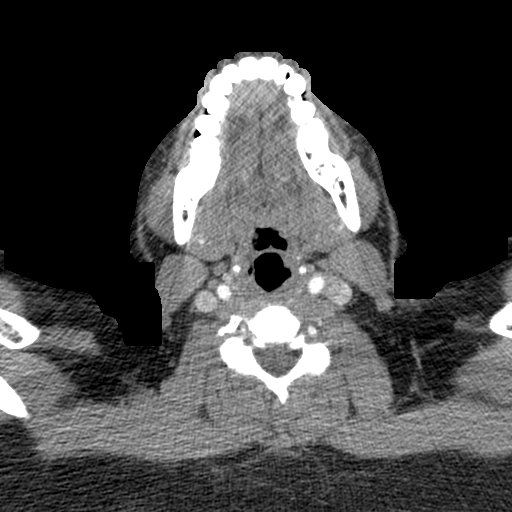
[im 109/163  soft-tissue]
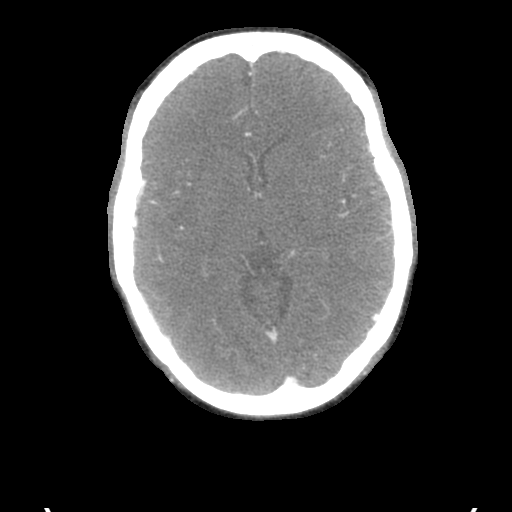

[Series 7: ax thin · axial · 0.33mm/px · z∈[-231,-6]mm · 6 of 317 slices shown]
[im 46/317  soft-tissue]
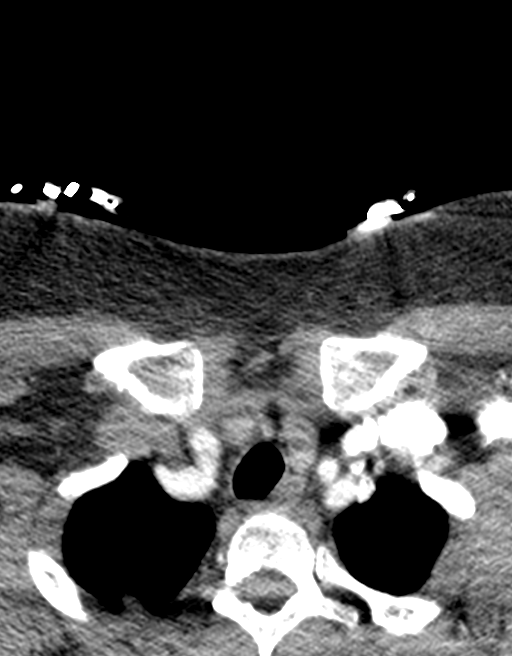
[im 91/317  bone]
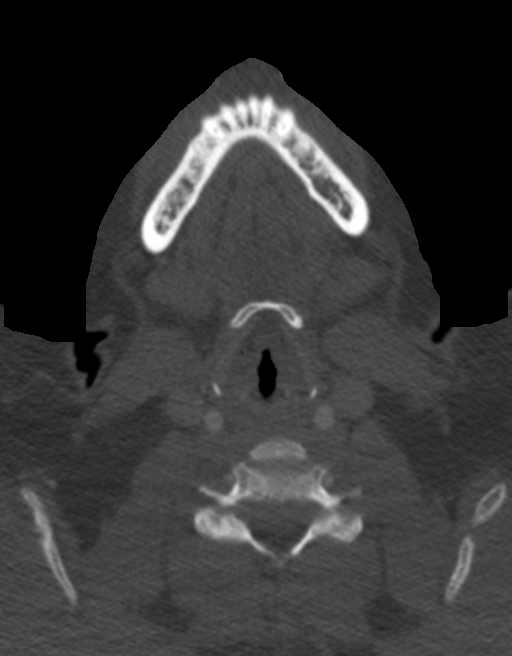
[im 136/317  soft-tissue]
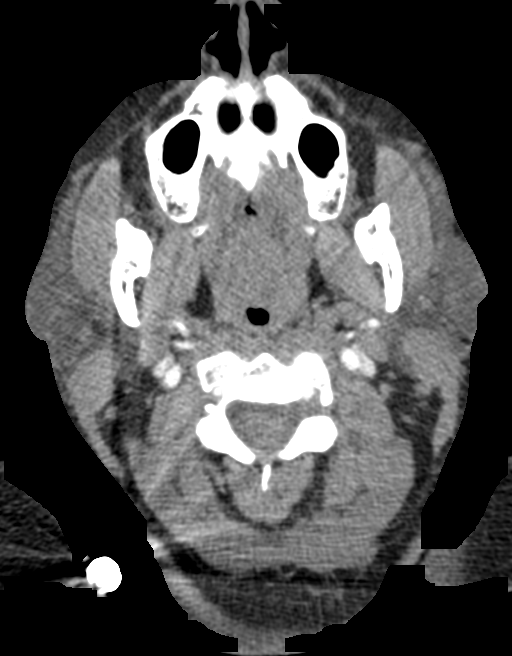
[im 181/317  bone]
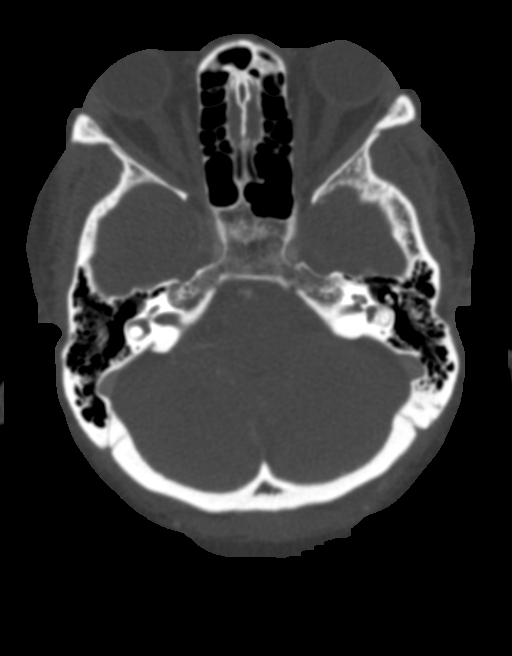
[im 226/317  soft-tissue]
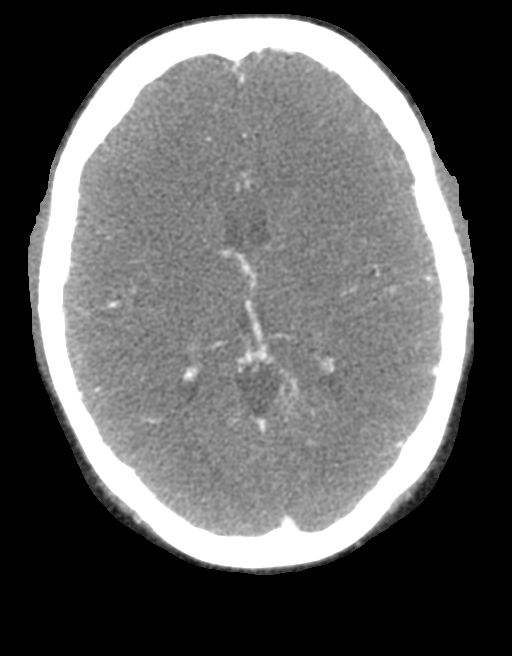
[im 271/317  bone]
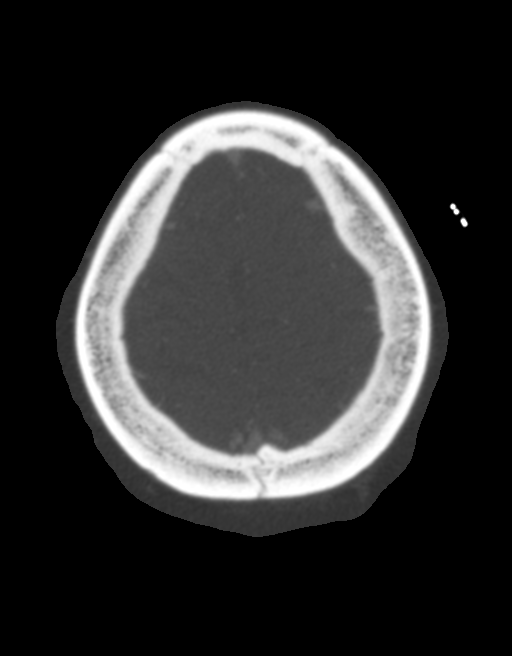

[8 of 33 positions shown; findings below may reference images not displayed]

FINDINGS: CTA NECK

Aortic arch: Great vessel origins are patent.

Right carotid system: Patent.  No stenosis.

Left carotid system: Patent.  No stenosis.

Vertebral arteries: Patent. Left vertebral artery is dominant. No
stenosis.

Skeleton: Facet predominant cervical spine degenerative changes.

Other neck: Unremarkable.

Upper chest: There is a 2 mm nodule of the right upper lobe (series
5, image 12).

Review of the MIP images confirms the above findings

CTA HEAD

Anterior circulation: Intracranial internal carotid arteries are
patent. Anterior cerebral arteries are patent. Left A1 ACA is
dominant. Anterior communicating artery is present. Middle cerebral
arteries are patent. Probable stenosis or occlusion of small mid
right M2 MCA branch in the region of insular infarct on MRI. This
corresponds to questioned abnormality on MRA.

Posterior circulation: Intracranial vertebral arteries are patent.
Basilar artery is patent. Major cerebellar artery origins are
patent. Bilateral posterior communicating arteries are present.
Posterior cerebral arteries are patent.

Venous sinuses: Patent as allowed by contrast bolus timing.

Review of the MIP images confirms the above findings
IMPRESSION: No large vessel occlusion. No hemodynamically significant stenosis
in the neck.

Probable stenosis or occlusion of a small mid right M2 MCA branch in
the region of insular infarct on MRI. This may correspond to
abnormality seen on MRA.

2 mm right upper lobe nodule. No follow-up needed if patient is
low-risk. Non-contrast chest CT can be considered in 12 months if
patient is high-risk. This recommendation follows the consensus
statement: Guidelines for Management of Incidental Pulmonary Nodules
Detected on CT Images: From the [HOSPITAL] [61]; Radiology

## 2021-06-23 MED ORDER — ASPIRIN 81 MG PO CHEW
81.0000 mg | CHEWABLE_TABLET | Freq: Once | ORAL | Status: AC
  Administered 2021-06-23: 81 mg via ORAL
  Filled 2021-06-23: qty 1

## 2021-06-23 MED ORDER — ACETAMINOPHEN 650 MG RE SUPP: 650.0000 mg | RECTAL | Status: DC | PRN

## 2021-06-23 MED ORDER — METOCLOPRAMIDE HCL 5 MG/ML IJ SOLN
10.0000 mg | Freq: Once | INTRAMUSCULAR | Status: AC
  Administered 2021-06-23: 10 mg via INTRAVENOUS
  Filled 2021-06-23: qty 2

## 2021-06-23 MED ORDER — DIPHENHYDRAMINE HCL 50 MG/ML IJ SOLN
25.0000 mg | Freq: Once | INTRAMUSCULAR | Status: AC
  Administered 2021-06-23: 25 mg via INTRAVENOUS
  Filled 2021-06-23: qty 1

## 2021-06-23 MED ORDER — IOHEXOL 350 MG/ML SOLN
75.0000 mL | Freq: Once | INTRAVENOUS | Status: AC | PRN
  Administered 2021-06-23: 75 mL via INTRAVENOUS

## 2021-06-23 MED ORDER — ACETAMINOPHEN 325 MG PO TABS
650.0000 mg | ORAL_TABLET | ORAL | Status: DC | PRN
  Administered 2021-06-23 – 2021-06-25 (×3): 650 mg via ORAL
  Filled 2021-06-23 (×3): qty 2

## 2021-06-23 MED ORDER — SODIUM CHLORIDE 0.9 % IV SOLN: INTRAVENOUS | Status: DC

## 2021-06-23 MED ORDER — ASPIRIN 81 MG PO CHEW
162.0000 mg | CHEWABLE_TABLET | Freq: Once | ORAL | Status: AC
  Administered 2021-06-23: 162 mg via ORAL
  Filled 2021-06-23: qty 2

## 2021-06-23 MED ORDER — ACETAMINOPHEN 500 MG PO TABS
1000.0000 mg | ORAL_TABLET | Freq: Once | ORAL | Status: AC
  Administered 2021-06-23: 1000 mg via ORAL
  Filled 2021-06-23: qty 2

## 2021-06-23 MED ORDER — SENNOSIDES-DOCUSATE SODIUM 8.6-50 MG PO TABS: 1.0000 | ORAL_TABLET | Freq: Every evening | ORAL | Status: DC | PRN

## 2021-06-23 MED ORDER — ACETAMINOPHEN 160 MG/5ML PO SOLN: 650.0000 mg | ORAL | Status: DC | PRN

## 2021-06-23 MED ORDER — CLOPIDOGREL BISULFATE 75 MG PO TABS
75.0000 mg | ORAL_TABLET | Freq: Every day | ORAL | Status: DC
  Administered 2021-06-24 – 2021-06-25 (×2): 75 mg via ORAL
  Filled 2021-06-23 (×2): qty 1

## 2021-06-23 MED ORDER — STROKE: EARLY STAGES OF RECOVERY BOOK
Freq: Once | Status: AC
  Filled 2021-06-23: qty 1

## 2021-06-23 MED ORDER — ASPIRIN EC 81 MG PO TBEC
81.0000 mg | DELAYED_RELEASE_TABLET | Freq: Every day | ORAL | Status: DC
Start: 2021-06-24 — End: 2021-06-25
  Administered 2021-06-24 – 2021-06-25 (×2): 81 mg via ORAL
  Filled 2021-06-23 (×2): qty 1

## 2021-06-23 MED ORDER — LACTATED RINGERS IV BOLUS
1000.0000 mL | Freq: Once | INTRAVENOUS | Status: AC
  Administered 2021-06-23: 1000 mL via INTRAVENOUS

## 2021-06-23 MED ORDER — CLOPIDOGREL BISULFATE 300 MG PO TABS
300.0000 mg | ORAL_TABLET | Freq: Once | ORAL | Status: AC
  Administered 2021-06-23: 300 mg via ORAL
  Filled 2021-06-23: qty 1

## 2021-06-23 NOTE — H&P (Signed)
History and Physical    Angela Hahn FWY:[id number] DOB: 1975/09/10 DOA: 06/23/2021  PCP: Laurey Morale, MD  Patient coming from: Home  Chief Complaint: right arm weakness.   HPI: Angela Hahn is a 45 y.o. female with medical history significant of iron deficiency anemia. Presenting with RUE weakness. She reports that she has been feeling faint over the last couple of days. She real noticed it yesterday while out doing errands. She became faint temporarily but did not pass out. She was able to collect herself and return home without incident. This morning she woke up and went to her son's room. She tried to open the door, but found that she could not move her arm. She felt some numbness in that arm. This episode lasted 20 - 30 seconds. She was able to regain some function but was panicked. She spoke with her family and they recommended coming to the ED. She denies any other aggravating or alleviating factors.  ED Course: MRI brain showed CVA. Neuro was consulted. She was started on DAPT. TRH was called for admission.   Review of Systems:  Denies CP, dyspnea, abdominal pain, N/V/D, fever. Review of systems is otherwise negative for all not mentioned in HPI.   PMHx Past Medical History:  Diagnosis Date   Anemia    Chronic headaches    Dysmenorrhea    Heart murmur    dx'd since childhood    PSHx Past Surgical History:  Procedure Laterality Date   TUBAL LIGATION      SocHx  reports that she quit smoking about 7 years ago. Her smoking use included cigarettes. She has never used smokeless tobacco. She reports that she does not drink alcohol and does not use drugs.  Allergies  Allergen Reactions   Hydrocodone     Itching, takes Benadryl with this medicine   Tramadol Other (See Comments)    headache    FamHx Family History  Problem Relation Age of Onset   Arthritis Mother    Depression Mother    Hypertension Mother    Diabetes Mother    Hypertension Father    Diabetes  Father     Prior to Admission medications   Medication Sig Start Date End Date Taking? Authorizing Provider  ferrous sulfate 325 (65 FE) MG tablet Take 1 tablet (325 mg total) by mouth 2 (two) times daily with a meal. Patient taking differently: Take 325 mg by mouth daily with breakfast. 06/11/18   Laurey Morale, MD  fluticasone (FLONASE) 50 MCG/ACT nasal spray Place 1-2 sprays into both nostrils daily for 7 days. 08/28/19 09/04/19  Wieters, Hallie C, PA-C  Norgestimate-Ethinyl Estradiol Triphasic (TRI-SPRINTEC) 0.18/0.215/0.25 MG-35 MCG tablet Take 1 tablet by mouth daily. 05/05/21   Laurey Morale, MD  ondansetron (ZOFRAN) 8 MG tablet Take 1 tablet (8 mg total) by mouth every 6 (six) hours as needed for nausea or vomiting. 03/08/21   Laurey Morale, MD  phentermine 37.5 MG capsule Take 1 capsule (37.5 mg total) by mouth every morning. 05/17/21   Laurey Morale, MD  propranolol ER (INDERAL LA) 80 MG 24 hr capsule Take 1 capsule (80 mg total) by mouth at bedtime. 04/01/19   Burchette, Alinda Sierras, MD  SUMAtriptan (IMITREX) 100 MG tablet Take one at onset of migraine and may repeat 1 in 2 hours.  Maximum of 2 in 24 hours. Patient taking differently: Take 100 mg by mouth every 2 (two) hours as needed for migraine. 04/01/19  Burchette, Alinda Sierras, MD  tizanidine (ZANAFLEX) 2 MG capsule TAKE ONE CAPSULE BY MOUTH THREE TIMES A DAY Patient taking differently: Take 2 mg by mouth 3 (three) times daily as needed for muscle spasms. 01/27/21   Laurey Morale, MD  triamcinolone cream (KENALOG) 0.1 % Apply 1 application topically 2 (two) times daily. 12/02/20   Laurey Morale, MD  Cetirizine HCl 10 MG CAPS Take 1 capsule (10 mg total) by mouth daily. 08/28/19 12/17/20  Janith Lima, PA-C    Physical Exam: Vitals:   06/23/21 1215 06/23/21 1230 06/23/21 1300 06/23/21 1336  BP: 134/79 125/75 138/84 (!) 153/89  Pulse: 76 70 67 72  Resp: 17 (!) 21 16 16   Temp:      TempSrc:      SpO2: 100% 100% 100% 100%  Weight:       Height:        General: 45 y.o. female resting in bed in NAD Eyes: PERRL, normal sclera ENMT: Nares patent w/o discharge, orophaynx clear, dentition normal, ears w/o discharge/lesions/ulcers Neck: Supple, trachea midline Cardiovascular: RRR, +S1, S2, no m/g/r, equal pulses throughout Respiratory: CTABL, no w/r/r, normal WOB GI: BS+, NDNT, no masses noted, no organomegaly noted MSK: No e/c/c Skin: No rashes, bruises, ulcerations noted Neuro: A&O x 3, 4/5 RUE msk strg w/ slightly weak finger grips Psyc: Appropriate interaction and affect, calm/cooperative  Labs on Admission: I have personally reviewed following labs and imaging studies  CBC: Recent Labs  Lab 06/23/21 0900 06/23/21 0928  WBC 4.9  --   NEUTROABS 2.8  --   HGB 12.4 13.6  HCT 39.7 40.0  MCV 79.4*  --   PLT 270  --    Basic Metabolic Panel: Recent Labs  Lab 06/23/21 0908 06/23/21 0928  NA 134* 138  K 3.8 3.8  CL 106 103  CO2 24  --   GLUCOSE 126* 125*  BUN 9 7  CREATININE 0.81 0.80  CALCIUM 9.0  --    GFR: Estimated Creatinine Clearance: 98.1 mL/min (by C-G formula based on SCr of 0.8 mg/dL). Liver Function Tests: Recent Labs  Lab 06/23/21 0908  AST 19  ALT 23  ALKPHOS 39  BILITOT 0.4  PROT 7.7  ALBUMIN 3.8   No results for input(s): LIPASE, AMYLASE in the last 168 hours. No results for input(s): AMMONIA in the last 168 hours. Coagulation Profile: Recent Labs  Lab 06/23/21 0908  INR 1.0   Cardiac Enzymes: No results for input(s): CKTOTAL, CKMB, CKMBINDEX, TROPONINI in the last 168 hours. BNP (last 3 results) No results for input(s): PROBNP in the last 8760 hours. HbA1C: No results for input(s): HGBA1C in the last 72 hours. CBG: No results for input(s): GLUCAP in the last 168 hours. Lipid Profile: No results for input(s): CHOL, HDL, LDLCALC, TRIG, CHOLHDL, LDLDIRECT in the last 72 hours. Thyroid Function Tests: No results for input(s): TSH, T4TOTAL, FREET4, T3FREE, THYROIDAB in the  last 72 hours. Anemia Panel: No results for input(s): VITAMINB12, FOLATE, FERRITIN, TIBC, IRON, RETICCTPCT in the last 72 hours. Urine analysis:    Component Value Date/Time   COLORURINE YELLOW 06/23/2021 [place] 06/23/2021 0934   LABSPEC 1.010 06/23/2021 0934   PHURINE 5.0 06/23/2021 0934   GLUCOSEU NEGATIVE 06/23/2021 0934   HGBUR NEGATIVE 06/23/2021 [place] 06/23/2021 [place] 06/23/2021 0934   PROTEINUR NEGATIVE 06/23/2021 0934   UROBILINOGEN 0.2 01/05/2021 1821   NITRITE NEGATIVE 06/23/2021 0934  LEUKOCYTESUR NEGATIVE 06/23/2021 0934    Radiological Exams on Admission: CT Angio Head W or Wo Contrast  Result Date: 06/23/2021 CLINICAL DATA:  Neuro deficit, acute, stroke suspected; right arm weakness, acute stroke on MRI EXAM: CT ANGIOGRAPHY HEAD AND NECK TECHNIQUE: Multidetector CT imaging of the head and neck was performed using the standard protocol during bolus administration of intravenous contrast. Multiplanar CT image reconstructions and MIPs were obtained to evaluate the vascular anatomy. Carotid stenosis measurements (when applicable) are obtained utilizing NASCET criteria, using the distal internal carotid diameter as the denominator. CONTRAST:  84mL OMNIPAQUE IOHEXOL 350 MG/ML SOLN COMPARISON:  Correlation made with recent MR imaging FINDINGS: CTA NECK Aortic arch: Great vessel origins are patent. Right carotid system: Patent.  No stenosis. Left carotid system: Patent.  No stenosis. Vertebral arteries: Patent. Left vertebral artery is dominant. No stenosis. Skeleton: Facet predominant cervical spine degenerative changes. Other neck: Unremarkable. Upper chest: There is a 2 mm nodule of the right upper lobe (series 5, image 12). Review of the MIP images confirms the above findings CTA HEAD Anterior circulation: Intracranial internal carotid arteries are patent. Anterior cerebral arteries are patent. Left A1 ACA is dominant.  Anterior communicating artery is present. Middle cerebral arteries are patent. Probable stenosis or occlusion of small mid right M2 MCA branch in the region of insular infarct on MRI. This corresponds to questioned abnormality on MRA. Posterior circulation: Intracranial vertebral arteries are patent. Basilar artery is patent. Major cerebellar artery origins are patent. Bilateral posterior communicating arteries are present. Posterior cerebral arteries are patent. Venous sinuses: Patent as allowed by contrast bolus timing. Review of the MIP images confirms the above findings IMPRESSION: No large vessel occlusion. No hemodynamically significant stenosis in the neck. Probable stenosis or occlusion of a small mid right M2 MCA branch in the region of insular infarct on MRI. This may correspond to abnormality seen on MRA. 2 mm right upper lobe nodule. No follow-up needed if patient is low-risk. Non-contrast chest CT can be considered in 12 months if patient is high-risk. This recommendation follows the consensus statement: Guidelines for Management of Incidental Pulmonary Nodules Detected on CT Images: From the Fleischner Society 2017; Radiology 2017; 284:228-243. Electronically Signed   By: Macy Mis M.D.   On: 06/23/2021 13:33   DG Chest 2 View  Result Date: 06/23/2021 CLINICAL DATA:  Chest pain EXAM: CHEST - 2 VIEW COMPARISON:  Chest x-ray 04/25/2020 FINDINGS: Heart size and mediastinal contours are within normal limits. No suspicious pulmonary opacities identified. No pleural effusion or pneumothorax visualized. No acute osseous abnormality appreciated. IMPRESSION: No acute intrathoracic process identified. Electronically Signed   By: Ofilia Neas M.D.   On: 06/23/2021 12:16   CT Angio Neck W and/or Wo Contrast  Result Date: 06/23/2021 CLINICAL DATA:  Neuro deficit, acute, stroke suspected; right arm weakness, acute stroke on MRI EXAM: CT ANGIOGRAPHY HEAD AND NECK TECHNIQUE: Multidetector CT  imaging of the head and neck was performed using the standard protocol during bolus administration of intravenous contrast. Multiplanar CT image reconstructions and MIPs were obtained to evaluate the vascular anatomy. Carotid stenosis measurements (when applicable) are obtained utilizing NASCET criteria, using the distal internal carotid diameter as the denominator. CONTRAST:  64mL OMNIPAQUE IOHEXOL 350 MG/ML SOLN COMPARISON:  Correlation made with recent MR imaging FINDINGS: CTA NECK Aortic arch: Great vessel origins are patent. Right carotid system: Patent.  No stenosis. Left carotid system: Patent.  No stenosis. Vertebral arteries: Patent. Left vertebral artery is dominant. No stenosis. Skeleton:  Facet predominant cervical spine degenerative changes. Other neck: Unremarkable. Upper chest: There is a 2 mm nodule of the right upper lobe (series 5, image 12). Review of the MIP images confirms the above findings CTA HEAD Anterior circulation: Intracranial internal carotid arteries are patent. Anterior cerebral arteries are patent. Left A1 ACA is dominant. Anterior communicating artery is present. Middle cerebral arteries are patent. Probable stenosis or occlusion of small mid right M2 MCA branch in the region of insular infarct on MRI. This corresponds to questioned abnormality on MRA. Posterior circulation: Intracranial vertebral arteries are patent. Basilar artery is patent. Major cerebellar artery origins are patent. Bilateral posterior communicating arteries are present. Posterior cerebral arteries are patent. Venous sinuses: Patent as allowed by contrast bolus timing. Review of the MIP images confirms the above findings IMPRESSION: No large vessel occlusion. No hemodynamically significant stenosis in the neck. Probable stenosis or occlusion of a small mid right M2 MCA branch in the region of insular infarct on MRI. This may correspond to abnormality seen on MRA. 2 mm right upper lobe nodule. No follow-up needed  if patient is low-risk. Non-contrast chest CT can be considered in 12 months if patient is high-risk. This recommendation follows the consensus statement: Guidelines for Management of Incidental Pulmonary Nodules Detected on CT Images: From the Fleischner Society 2017; Radiology 2017; 284:228-243. Electronically Signed   By: Macy Mis M.D.   On: 06/23/2021 13:33   MR ANGIO HEAD WO CONTRAST  Result Date: 06/23/2021 CLINICAL DATA:  Stroke, follow-up. Additional history provided: Lightheadedness, right arm weakness, chest pain, headache. EXAM: MRI HEAD WITHOUT CONTRAST MRA HEAD WITHOUT CONTRAST TECHNIQUE: Multiplanar, multi-echo pulse sequences of the brain and surrounding structures were acquired without intravenous contrast. Angiographic images of the Circle of Willis were acquired using MRA technique without intravenous contrast. COMPARISON:  Head CT 06/23/2021. FINDINGS: MRI HEAD FINDINGS Brain: Mild intermittent motion degradation. Cerebral volume is normal. 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 Additional 2 mm acute cortical infarct within the high posterior right frontal lobe (series 9, image 99). A subtle punctate acute infarct is also questioned within the high posterior left frontal lobe (versus artifact) (for instance as seen on series 9, image 98) (series 17, image 57). No evidence of an intracranial mass. No chronic intracranial blood products. No extra-axial fluid collection. No midline shift. Vascular: T2 FLAIR hyperintense signal abnormality within right M2 MCA vessels within the right sylvian fissure, which may reflect thrombus or slow flow within these vessels. Skull and upper cervical spine: Flow voids otherwise maintained within the proximal large arterial vessels. Sinuses/Orbits: Visualized orbits show no acute finding. No significant paranasal sinus disease. MRA HEAD FINDINGS Moderately motion degraded exam. Anterior  circulation: The intracranial internal carotid arteries are patent. The M1 middle cerebral arteries are patent. 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 No left M2 proximal branch occlusion or severe proximal stenosis is identified. The anterior cerebral arteries are patent. 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 Within the limitations of motion degradation, no intracranial aneurysm is identified. Posterior circulation: The intracranial vertebral arteries are patent. Dominant left vertebral artery. The basilar artery is patent. The posterior cerebral arteries are patent. Fetal origin right posterior cerebral artery. A left posterior communicating artery is also present. Anatomic variants: As described. MRI brain impressions #2 and #3 and MRA head impression #2 called by telephone at  the time of interpretation on 06/23/2021 at 12:36 pm to provider Dr. Armandina Gemma, who verbally acknowledged these results. IMPRESSION: MRI brain: 1. Mildly motion degraded exam. 2. 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 3. Additional 2 mm acute cortical infarct within the high posterior right frontal lobe (also within the right MCA vascular territory). 4. Subtle punctate acute infarct (versus artifact) within the high posterior left frontal lobe. MRA head: 1. Moderately motion degraded exam. 2. 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 3. 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 Electronically Signed   By: Kellie Simmering D.O.   On: 06/23/2021 12:45   MR ANGIO NECK WO CONTRAST  Result Date: 06/23/2021 CLINICAL DATA:  Stroke, follow-up. Additional history provided: Lightheadedness, right arm weakness, chest pain and headache. EXAM: MRA NECK WITHOUT  CONTRAST TECHNIQUE: Angiographic images of the neck were acquired using MRA technique without intravenous contrast. Carotid stenosis measurements (when applicable) are obtained utilizing NASCET criteria, using the distal internal carotid diameter as the denominator. COMPARISON:  CT angiogram chest 09/28/2011. Concurrently performed MRI brain and MRA head 06/23/2021. FINDINGS: Motion degradation at the level of the lower neck/upper chest. Standard aortic branching. The common carotid and internal carotid arteries are patent within the neck without hemodynamically significant stenosis (50% or greater). The vertebral arteries are patent within the neck. The left vertebral artery is dominant. Motion degradation precludes evaluation for stenosis at the origin of the non-dominant right vertebral artery. No appreciable hemodynamically significant stenosis elsewhere within the cervical vertebral arteries. IMPRESSION: The common carotid and internal carotid arteries are patent within the neck without hemodynamically significant stenosis. The vertebral arteries are patent within the neck. Motion degradation precludes evaluation for stenosis at the origin of the non-dominant right vertebral artery. No appreciable hemodynamically significant stenosis elsewhere within the cervical vertebral arteries. Electronically Signed   By: Kellie Simmering D.O.   On: 06/23/2021 12:53   MR BRAIN WO CONTRAST  Result Date: 06/23/2021 CLINICAL DATA:  Stroke, follow-up. Additional history provided: Lightheadedness, right arm weakness, chest pain, headache. EXAM: MRI HEAD WITHOUT CONTRAST MRA HEAD WITHOUT CONTRAST TECHNIQUE: Multiplanar, multi-echo pulse sequences of the brain and surrounding structures were acquired without intravenous contrast. Angiographic images of the Circle of Willis were acquired using MRA technique without intravenous contrast. COMPARISON:  Head CT 06/23/2021. FINDINGS: MRI HEAD FINDINGS Brain: Mild intermittent motion  degradation. Cerebral volume is normal. 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 Additional 2 mm acute cortical infarct within the high posterior right frontal lobe (series 9, image 99). A subtle punctate acute infarct is also questioned within the high posterior left frontal lobe (versus artifact) (for instance as seen on series 9, image 98) (series 17, image 57). No evidence of an intracranial mass. No chronic intracranial blood products. No extra-axial fluid collection. No midline shift. Vascular: T2 FLAIR hyperintense signal abnormality within right M2 MCA vessels within the right sylvian fissure, which may reflect thrombus or slow flow within these vessels. Skull and upper cervical spine: Flow voids otherwise maintained within the proximal large arterial vessels. Sinuses/Orbits: Visualized orbits show no acute finding. No significant paranasal sinus disease. MRA HEAD FINDINGS Moderately motion degraded exam. Anterior circulation: The intracranial internal carotid arteries are patent. The M1 middle cerebral arteries are patent. Occlusion versus severe, near-occlusive stenosis of a proximal-to-mid right M2 MCA vessel (series 23, image 70) (series 102,  image 17). No left M2 proximal branch occlusion or severe proximal stenosis is identified. The anterior cerebral arteries are patent. 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 Within the limitations of motion degradation, no intracranial aneurysm is identified. Posterior circulation: The intracranial vertebral arteries are patent. Dominant left vertebral artery. The basilar artery is patent. The posterior cerebral arteries are patent. Fetal origin right posterior cerebral artery. A left posterior communicating artery is also present. Anatomic variants: As described. MRI brain impressions #2 and #3 and MRA head impression #2 called by telephone at the time of  interpretation on 06/23/2021 at 12:36 pm to provider Dr. Armandina Gemma, who verbally acknowledged these results. IMPRESSION: MRI brain: 1. Mildly motion degraded exam. 2. 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 3. Additional 2 mm acute cortical infarct within the high posterior right frontal lobe (also within the right MCA vascular territory). 4. Subtle punctate acute infarct (versus artifact) within the high posterior left frontal lobe. MRA head: 1. Moderately motion degraded exam. 2. 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 3. 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 Electronically Signed   By: Kellie Simmering D.O.   On: 06/23/2021 12:45   CT HEAD CODE STROKE WO CONTRAST  Result Date: 06/23/2021 CLINICAL DATA:  Code stroke. Near syncope yesterday. Right arm numbness today. EXAM: CT HEAD WITHOUT CONTRAST TECHNIQUE: Contiguous axial images were obtained from the base of the skull through the vertex without intravenous contrast. COMPARISON:  04/25/2020 FINDINGS: Brain: No evidence of acute infarction, hemorrhage, hydrocephalus, extra-axial collection or mass lesion/mass effect. Vascular: No hyperdense vessel or unexpected calcification. Skull: Normal. Negative for fracture or focal lesion. Sinuses/Orbits: No acute finding. Other: These results were communicated to Dr Quinn Axe at 9:34 am on 06/23/2021 by text page via the [facility] messaging system. Motion artifact words the vertex. ASPECTS [facility] Stroke Program Early CT Score) - Ganglionic level infarction (caudate, lentiform nuclei, internal capsule, insula, M1-M3 cortex): 7 - Supraganglionic infarction (M4-M6 cortex): 3 Total score (0-10 with 10 being normal): 10 IMPRESSION: No acute finding. Electronically Signed   By: Jorje Guild M.D.   On: 06/23/2021 09:35    EKG: Independently reviewed. Sinus, no st  elevations  Assessment/Plan CVA     - admit to inpt, tele @ [facility]     - right M2 MCA infarct     - neuro onboard, appreciate assistance     - DAPT     - permissive HTN for now     - check A1c, LDL     - echo ordered     - PT/OT/SLP     - rpt MRI brain in AM  DVT prophylaxis: SCDs  Code Status: FULL  Family Communication: w/ family at bedside  Consults called: Neurology   Status is: Inpatient  Remains inpatient appropriate because: severity of illness  Mason Dibiasio A Marylyn Ishihara DO [facility]  If 7PM-7AM, please contact night-coverage [email or web address]  06/23/2021, 1:59 PM

## 2021-06-23 NOTE — ED Provider Notes (Signed)
Emergency Medicine Provider Triage Evaluation Note  Angela Hahn , a 45 y.o. female  was evaluated in triage.  Pt complains of RUE weakness that started this morning around 7AM.  Patient states she has been feeling lightheaded for the past few days.  Patient had a near syncopal episode yesterday while she was walking.  Denies any loss of consciousness.  Patient states she went to bed and woke up feeling fine however, at 7 AM she developed acute right upper extremity weakness.  Patient states she was try to grab the door however could not.  No history of CVA.  Denies speech changes.  Admits to worsening bilateral blurry vision.  She also endorses chest tightness.  Review of Systems  Positive: Weakness, light-headedness Negative: fever  Physical Exam  BP 110/70 (BP Location: Left Arm)   Pulse (!) 107   Temp 98.7 F (37.1 C) (Oral)   Resp 19   Ht 5\' 6"  (1.676 m)   Wt 89.8 kg   LMP 06/05/2021   SpO2 100%   BMI 31.96 kg/m  Gen:   Awake, no distress   Resp:  Normal effort  MSK:   Moves extremities without difficulty Other:  No facial droop, normal speech, RUE weakness  Medical Decision Making  Medically screening exam initiated at 9:08 AM.  Appropriate orders placed.  MIAISABELLA BACORN was informed that the remainder of the evaluation will be completed by another provider, this initial triage assessment does not replace that evaluation, and the importance of remaining in the ED until their evaluation is complete.  Code stroke initiated in triage. Patient within TPA window with RUE weakness.    Suzy Bouchard, PA-C 06/23/21 0911    Regan Lemming, MD 06/23/21 [phone]

## 2021-06-23 NOTE — ED Triage Notes (Signed)
Pt states that she had a near syncopal event yesterday after picking up her daughter. Pt states she woke up this morning and had a strange feeling in her arm, some numbness, and couldn't grab door. Pt states that she has felt like her heart is racing. Pt c/o chest pain as well as arm weakness. Stroke screen neg.

## 2021-06-23 NOTE — ED Provider Notes (Addendum)
[facility] DEPT Provider Note   CSN: [id number] Arrival date & time: 06/23/21  8590  An emergency department physician performed an initial assessment on this suspected stroke patient at 0900.  History Chief Complaint  Patient presents with   Extremity Weakness    Angela Hahn is a 45 y.o. female.  HPI  45 year old female with medical history below who presents to the emergency department with multiple complaints.  Primarily, she complains of right upper extremity weakness that began this morning around 7 AM.  She states that she has been feeling lightheadedness for the past few days and had a near syncopal episode yesterday while ambulating.  She denies any head trauma or loss of consciousness.  She states that she was normal when she awoke this morning but acutely at 7 AM developed right upper extremity weakness.  She endorsed a heaviness in her right arm while attempting to grab a door.  She denies any facial droop, dysarthria, diplopia, any other numbness or weakness.  She does endorse blurry vision and chest tightness.  She endorses paresthesias in her right arm.  While in triage, the patient was evaluated and found to have 4-5 strength in the right upper extremity.  Code stroke called upon patient evaluation.  Past Medical History:  Diagnosis Date   Anemia    Chronic headaches    Dysmenorrhea    Heart murmur    dx'd since childhood    Patient Active Problem List   Diagnosis Date Noted   CVA (cerebral vascular accident) ([place]) 06/23/2021   COVID-19 virus infection 03/08/2021   Menorrhagia with irregular cycle 10/27/2020   Flu-like symptoms 09/30/2018   Pain of right thumb 06/04/2018   Concussion with loss of consciousness 09/24/2015   Chronic low back pain 06/18/2012    Past Surgical History:  Procedure Laterality Date   TUBAL LIGATION       OB History     Gravida  3   Para  3   Term  3   Preterm      AB      Living  3       SAB      IAB      Ectopic      Multiple      Live Births              Family History  Problem Relation Age of Onset   Arthritis Mother    Depression Mother    Hypertension Mother    Diabetes Mother    Hypertension Father    Diabetes Father     Social History   Tobacco Use   Smoking status: Former    Types: Cigarettes    Quit date: 05/28/2014    Years since quitting: 7.0   Smokeless tobacco: Never  Vaping Use   Vaping Use: Never used  Substance Use Topics   Alcohol use: No    Alcohol/week: 0.0 standard drinks   Drug use: No    Home Medications Prior to Admission medications   Medication Sig Start Date End Date Taking? Authorizing Provider  ferrous sulfate 325 (65 FE) MG tablet Take 1 tablet (325 mg total) by mouth 2 (two) times daily with a meal. Patient taking differently: Take 325 mg by mouth daily with breakfast. 06/11/18   Laurey Morale, MD  fluticasone (FLONASE) 50 MCG/ACT nasal spray Place 1-2 sprays into both nostrils daily for 7 days. 08/28/19 09/04/19  Wieters, Elesa Hacker, PA-C  Norgestimate-Ethinyl  Estradiol Triphasic (TRI-SPRINTEC) 0.18/0.215/0.25 MG-35 MCG tablet Take 1 tablet by mouth daily. 05/05/21   Laurey Morale, MD  ondansetron (ZOFRAN) 8 MG tablet Take 1 tablet (8 mg total) by mouth every 6 (six) hours as needed for nausea or vomiting. 03/08/21   Laurey Morale, MD  phentermine 37.5 MG capsule Take 1 capsule (37.5 mg total) by mouth every morning. 05/17/21   Laurey Morale, MD  propranolol ER (INDERAL LA) 80 MG 24 hr capsule Take 1 capsule (80 mg total) by mouth at bedtime. 04/01/19   Burchette, Alinda Sierras, MD  SUMAtriptan (IMITREX) 100 MG tablet Take one at onset of migraine and may repeat 1 in 2 hours.  Maximum of 2 in 24 hours. Patient taking differently: Take 100 mg by mouth every 2 (two) hours as needed for migraine. 04/01/19   Burchette, Alinda Sierras, MD  tizanidine (ZANAFLEX) 2 MG capsule TAKE ONE CAPSULE BY MOUTH THREE TIMES A DAY Patient taking  differently: Take 2 mg by mouth 3 (three) times daily as needed for muscle spasms. 01/27/21   Laurey Morale, MD  triamcinolone cream (KENALOG) 0.1 % Apply 1 application topically 2 (two) times daily. 12/02/20   Laurey Morale, MD  Cetirizine HCl 10 MG CAPS Take 1 capsule (10 mg total) by mouth daily. 08/28/19 12/17/20  Wieters, Hallie C, PA-C    Allergies    Hydrocodone and Tramadol  Review of Systems   Review of Systems  Constitutional:  Negative for chills and fever.  HENT:  Negative for ear pain and sore throat.   Eyes:  Positive for visual disturbance. Negative for pain.  Respiratory:  Negative for cough and shortness of breath.   Cardiovascular:  Negative for chest pain and palpitations.  Gastrointestinal:  Negative for abdominal pain and vomiting.  Genitourinary:  Negative for dysuria and hematuria.  Musculoskeletal:  Negative for arthralgias and back pain.  Skin:  Negative for color change and rash.  Neurological:  Positive for weakness, light-headedness and numbness. Negative for seizures and syncope.  All other systems reviewed and are negative.  Physical Exam Updated Vital Signs BP (!) 153/89   Pulse 72   Temp 98.7 F (37.1 C) (Oral)   Resp 16   Ht 5\' 6"  (1.676 m)   Wt 89.8 kg   LMP 06/05/2021   SpO2 100%   BMI 31.96 kg/m   Physical Exam Vitals and nursing note reviewed.  Constitutional:      General: She is not in acute distress.    Appearance: She is well-developed.  HENT:     Head: Normocephalic and atraumatic.  Eyes:     Conjunctiva/sclera: Conjunctivae normal.     Pupils: Pupils are equal, round, and reactive to light.  Cardiovascular:     Rate and Rhythm: Normal rate and regular rhythm.     Heart sounds: No murmur heard. Pulmonary:     Effort: Pulmonary effort is normal. No respiratory distress.     Breath sounds: Normal breath sounds.  Abdominal:     General: There is no distension.     Palpations: Abdomen is soft.     Tenderness: There is no  abdominal tenderness. There is no guarding.  Musculoskeletal:        General: No deformity or signs of injury.     Cervical back: Normal range of motion and neck supple.  Skin:    General: Skin is warm and dry.     Findings: No lesion or rash.  Neurological:  Mental Status: She is alert.     Comments: MENTAL STATUS EXAM:    Orientation: Alert and oriented to person, place and time.  Memory: Cooperative, follows commands well.  Language: Speech is clear and language is normal.   CRANIAL NERVES:    CN 2 (Optic): Visual fields intact to confrontation.  CN 3,4,6 (EOM): Pupils equal and reactive to light. Full extraocular eye movement without nystagmus.  CN 5 (Trigeminal): Facial sensation is normal, no weakness of masticatory muscles.  CN 7 (Facial): No facial weakness or asymmetry.  CN 8 (Auditory): Auditory acuity grossly normal.  CN 9,10 (Glossophar): The uvula is midline, the palate elevates symmetrically.  CN 11 (spinal access): Normal sternocleidomastoid and trapezius strength.  CN 12 (Hypoglossal): The tongue is midline. No atrophy or fasciculations.Marland Kitchen   MOTOR:  Muscle Strength: 4/5RUE, 5/5LUE, 5/5RLE, 5/5LLE .   COORDINATION:   Intact finger-to-nose, no tremor, no pronator drift.   SENSATION:   Intact to light touch all four extremities.  GAIT: Gait normal without ataxia     ED Results / Procedures / Treatments   Labs (all labs ordered are listed, but only abnormal results are displayed) Labs Reviewed  CBC - Abnormal; Notable for the following components:      Result Value   MCV 79.4 (*)    MCH 24.8 (*)    All other components within normal limits  COMPREHENSIVE METABOLIC PANEL - Abnormal; Notable for the following components:   Sodium 134 (*)    Glucose, Bld 126 (*)    Anion gap 4 (*)    All other components within normal limits  I-STAT CHEM 8, ED - Abnormal; Notable for the following components:   Glucose, Bld 125 (*)    All other components within normal  limits  RESP PANEL BY RT-PCR (FLU A&B, COVID) ARPGX2  PROTIME-INR  APTT  RAPID URINE DRUG SCREEN, HOSP PERFORMED  URINALYSIS, ROUTINE W REFLEX MICROSCOPIC  DIFFERENTIAL  ETHANOL  I-STAT BETA HCG BLOOD, ED ([facility], [facility], [facility] ONLY)  TROPONIN I (HIGH SENSITIVITY)  TROPONIN I (HIGH SENSITIVITY)    EKG EKG Interpretation  Date/Time:  Thursday June 23 2021 08:45:08 EDT Ventricular Rate:  85 PR Interval:  139 QRS Duration: 91 QT Interval:  362 QTC Calculation: 431 R Axis:   -3 Text Interpretation: Sinus rhythm Confirmed by Regan Lemming ([id number]) on 06/23/2021 9:36:57 AM  Radiology CT Angio Head W or Wo Contrast  Result Date: 06/23/2021 CLINICAL DATA:  Neuro deficit, acute, stroke suspected; right arm weakness, acute stroke on MRI EXAM: CT ANGIOGRAPHY HEAD AND NECK TECHNIQUE: Multidetector CT imaging of the head and neck was performed using the standard protocol during bolus administration of intravenous contrast. Multiplanar CT image reconstructions and MIPs were obtained to evaluate the vascular anatomy. Carotid stenosis measurements (when applicable) are obtained utilizing NASCET criteria, using the distal internal carotid diameter as the denominator. CONTRAST:  67mL OMNIPAQUE IOHEXOL 350 MG/ML SOLN COMPARISON:  Correlation made with recent MR imaging FINDINGS: CTA NECK Aortic arch: Great vessel origins are patent. Right carotid system: Patent.  No stenosis. Left carotid system: Patent.  No stenosis. Vertebral arteries: Patent. Left vertebral artery is dominant. No stenosis. Skeleton: Facet predominant cervical spine degenerative changes. Other neck: Unremarkable. Upper chest: There is a 2 mm nodule of the right upper lobe (series 5, image 12). Review of the MIP images confirms the above findings CTA HEAD Anterior circulation: Intracranial internal carotid arteries are patent. Anterior cerebral arteries are patent. Left A1 ACA is dominant. Anterior  communicating artery is present. Middle cerebral  arteries are patent. Probable stenosis or occlusion of small mid right M2 MCA branch in the region of insular infarct on MRI. This corresponds to questioned abnormality on MRA. Posterior circulation: Intracranial vertebral arteries are patent. Basilar artery is patent. Major cerebellar artery origins are patent. Bilateral posterior communicating arteries are present. Posterior cerebral arteries are patent. Venous sinuses: Patent as allowed by contrast bolus timing. Review of the MIP images confirms the above findings IMPRESSION: No large vessel occlusion. No hemodynamically significant stenosis in the neck. Probable stenosis or occlusion of a small mid right M2 MCA branch in the region of insular infarct on MRI. This may correspond to abnormality seen on MRA. 2 mm right upper lobe nodule. No follow-up needed if patient is low-risk. Non-contrast chest CT can be considered in 12 months if patient is high-risk. This recommendation follows the consensus statement: Guidelines for Management of Incidental Pulmonary Nodules Detected on CT Images: From the Fleischner Society 2017; Radiology 2017; 284:228-243. Electronically Signed   By: Macy Mis M.D.   On: 06/23/2021 13:33   DG Chest 2 View  Result Date: 06/23/2021 CLINICAL DATA:  Chest pain EXAM: CHEST - 2 VIEW COMPARISON:  Chest x-ray 04/25/2020 FINDINGS: Heart size and mediastinal contours are within normal limits. No suspicious pulmonary opacities identified. No pleural effusion or pneumothorax visualized. No acute osseous abnormality appreciated. IMPRESSION: No acute intrathoracic process identified. Electronically Signed   By: Ofilia Neas M.D.   On: 06/23/2021 12:16   CT Angio Neck W and/or Wo Contrast  Result Date: 06/23/2021 CLINICAL DATA:  Neuro deficit, acute, stroke suspected; right arm weakness, acute stroke on MRI EXAM: CT ANGIOGRAPHY HEAD AND NECK TECHNIQUE: Multidetector CT imaging of the head and neck was performed using the standard  protocol during bolus administration of intravenous contrast. Multiplanar CT image reconstructions and MIPs were obtained to evaluate the vascular anatomy. Carotid stenosis measurements (when applicable) are obtained utilizing NASCET criteria, using the distal internal carotid diameter as the denominator. CONTRAST:  23mL OMNIPAQUE IOHEXOL 350 MG/ML SOLN COMPARISON:  Correlation made with recent MR imaging FINDINGS: CTA NECK Aortic arch: Great vessel origins are patent. Right carotid system: Patent.  No stenosis. Left carotid system: Patent.  No stenosis. Vertebral arteries: Patent. Left vertebral artery is dominant. No stenosis. Skeleton: Facet predominant cervical spine degenerative changes. Other neck: Unremarkable. Upper chest: There is a 2 mm nodule of the right upper lobe (series 5, image 12). Review of the MIP images confirms the above findings CTA HEAD Anterior circulation: Intracranial internal carotid arteries are patent. Anterior cerebral arteries are patent. Left A1 ACA is dominant. Anterior communicating artery is present. Middle cerebral arteries are patent. Probable stenosis or occlusion of small mid right M2 MCA branch in the region of insular infarct on MRI. This corresponds to questioned abnormality on MRA. Posterior circulation: Intracranial vertebral arteries are patent. Basilar artery is patent. Major cerebellar artery origins are patent. Bilateral posterior communicating arteries are present. Posterior cerebral arteries are patent. Venous sinuses: Patent as allowed by contrast bolus timing. Review of the MIP images confirms the above findings IMPRESSION: No large vessel occlusion. No hemodynamically significant stenosis in the neck. Probable stenosis or occlusion of a small mid right M2 MCA branch in the region of insular infarct on MRI. This may correspond to abnormality seen on MRA. 2 mm right upper lobe nodule. No follow-up needed if patient is low-risk. Non-contrast chest CT can be  considered in 12 months if patient is  high-risk. This recommendation follows the consensus statement: Guidelines for Management of Incidental Pulmonary Nodules Detected on CT Images: From the Fleischner Society 2017; Radiology 2017; 284:228-243. Electronically Signed   By: Macy Mis M.D.   On: 06/23/2021 13:33   MR ANGIO HEAD WO CONTRAST  Result Date: 06/23/2021 CLINICAL DATA:  Stroke, follow-up. Additional history provided: Lightheadedness, right arm weakness, chest pain, headache. EXAM: MRI HEAD WITHOUT CONTRAST MRA HEAD WITHOUT CONTRAST TECHNIQUE: Multiplanar, multi-echo pulse sequences of the brain and surrounding structures were acquired without intravenous contrast. Angiographic images of the Circle of Willis were acquired using MRA technique without intravenous contrast. COMPARISON:  Head CT 06/23/2021. FINDINGS: MRI HEAD FINDINGS Brain: Mild intermittent motion degradation. Cerebral volume is normal. 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 Additional 2 mm acute cortical infarct within the high posterior right frontal lobe (series 9, image 99). A subtle punctate acute infarct is also questioned within the high posterior left frontal lobe (versus artifact) (for instance as seen on series 9, image 98) (series 17, image 57). No evidence of an intracranial mass. No chronic intracranial blood products. No extra-axial fluid collection. No midline shift. Vascular: T2 FLAIR hyperintense signal abnormality within right M2 MCA vessels within the right sylvian fissure, which may reflect thrombus or slow flow within these vessels. Skull and upper cervical spine: Flow voids otherwise maintained within the proximal large arterial vessels. Sinuses/Orbits: Visualized orbits show no acute finding. No significant paranasal sinus disease. MRA HEAD FINDINGS Moderately motion degraded exam. Anterior circulation: The intracranial internal carotid arteries are  patent. The M1 middle cerebral arteries are patent. 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 No left M2 proximal branch occlusion or severe proximal stenosis is identified. The anterior cerebral arteries are patent. 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 Within the limitations of motion degradation, no intracranial aneurysm is identified. Posterior circulation: The intracranial vertebral arteries are patent. Dominant left vertebral artery. The basilar artery is patent. The posterior cerebral arteries are patent. Fetal origin right posterior cerebral artery. A left posterior communicating artery is also present. Anatomic variants: As described. MRI brain impressions #2 and #3 and MRA head impression #2 called by telephone at the time of interpretation on 06/23/2021 at 12:36 pm to provider Dr. Armandina Gemma, who verbally acknowledged these results. IMPRESSION: MRI brain: 1. Mildly motion degraded exam. 2. 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 3. Additional 2 mm acute cortical infarct within the high posterior right frontal lobe (also within the right MCA vascular territory). 4. Subtle punctate acute infarct (versus artifact) within the high posterior left frontal lobe. MRA head: 1. Moderately motion degraded exam. 2. 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 3. 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 Electronically Signed   By: Kellie Simmering D.O.   On: 06/23/2021 12:45   MR ANGIO NECK WO CONTRAST  Result Date: 06/23/2021 CLINICAL DATA:  Stroke, follow-up. Additional history provided: Lightheadedness, right arm weakness, chest pain and headache. EXAM: MRA NECK WITHOUT CONTRAST TECHNIQUE: Angiographic images of the neck were  acquired using MRA technique without intravenous contrast. Carotid stenosis measurements (when applicable) are obtained utilizing NASCET criteria, using the distal internal carotid diameter as the denominator. COMPARISON:  CT angiogram chest 09/28/2011. Concurrently performed MRI brain and MRA head 06/23/2021.  FINDINGS: Motion degradation at the level of the lower neck/upper chest. Standard aortic branching. The common carotid and internal carotid arteries are patent within the neck without hemodynamically significant stenosis (50% or greater). The vertebral arteries are patent within the neck. The left vertebral artery is dominant. Motion degradation precludes evaluation for stenosis at the origin of the non-dominant right vertebral artery. No appreciable hemodynamically significant stenosis elsewhere within the cervical vertebral arteries. IMPRESSION: The common carotid and internal carotid arteries are patent within the neck without hemodynamically significant stenosis. The vertebral arteries are patent within the neck. Motion degradation precludes evaluation for stenosis at the origin of the non-dominant right vertebral artery. No appreciable hemodynamically significant stenosis elsewhere within the cervical vertebral arteries. Electronically Signed   By: Kellie Simmering D.O.   On: 06/23/2021 12:53   MR BRAIN WO CONTRAST  Result Date: 06/23/2021 CLINICAL DATA:  Stroke, follow-up. Additional history provided: Lightheadedness, right arm weakness, chest pain, headache. EXAM: MRI HEAD WITHOUT CONTRAST MRA HEAD WITHOUT CONTRAST TECHNIQUE: Multiplanar, multi-echo pulse sequences of the brain and surrounding structures were acquired without intravenous contrast. Angiographic images of the Circle of Willis were acquired using MRA technique without intravenous contrast. COMPARISON:  Head CT 06/23/2021. FINDINGS: MRI HEAD FINDINGS Brain: Mild intermittent motion degradation. Cerebral volume is normal. Acute infarct  within the right corona radiata/basal ganglia and right insula measuring 3.0 X 1.0 cm. Additional 2 mm acute cortical infarct within the high posterior right frontal lobe (series 9, image 99). A subtle punctate acute infarct is also questioned within the high posterior left frontal lobe (versus artifact) (for instance as seen on series 9, image 98) (series 17, image 57). No evidence of an intracranial mass. No chronic intracranial blood products. No extra-axial fluid collection. No midline shift. Vascular: T2 FLAIR hyperintense signal abnormality within right M2 MCA vessels within the right sylvian fissure, which may reflect thrombus or slow flow within these vessels. Skull and upper cervical spine: Flow voids otherwise maintained within the proximal large arterial vessels. Sinuses/Orbits: Visualized orbits show no acute finding. No significant paranasal sinus disease. MRA HEAD FINDINGS Moderately motion degraded exam. Anterior circulation: The intracranial internal carotid arteries are patent. The M1 middle cerebral arteries are patent. 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 No left M2 proximal branch occlusion or severe proximal stenosis is identified. The anterior cerebral arteries are patent. 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 Within the limitations of motion degradation, no intracranial aneurysm is identified. Posterior circulation: The intracranial vertebral arteries are patent. Dominant left vertebral artery. The basilar artery is patent. The posterior cerebral arteries are patent. Fetal origin right posterior cerebral artery. A left posterior communicating artery is also present. Anatomic variants: As described. MRI brain impressions #2 and #3 and MRA head impression #2 called by telephone at the time of interpretation on 06/23/2021 at 12:36 pm to provider Dr.  Armandina Gemma, who verbally acknowledged these results. IMPRESSION: MRI brain: 1. Mildly motion degraded exam. 2. 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 3. Additional 2 mm acute cortical infarct within the high posterior right frontal lobe (also within the right MCA vascular territory). 4. Subtle punctate acute infarct (versus artifact) within the high posterior left frontal lobe. MRA head: 1. Moderately motion degraded exam. 2. 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 3. Moderate focal stenosis within a developmentally  hypoplastic right A1 segment. Electronically Signed   By: Kellie Simmering D.O.   On: 06/23/2021 12:45   CT HEAD CODE STROKE WO CONTRAST  Result Date: 06/23/2021 CLINICAL DATA:  Code stroke. Near syncope yesterday. Right arm numbness today. EXAM: CT HEAD WITHOUT CONTRAST TECHNIQUE: Contiguous axial images were obtained from the base of the skull through the vertex without intravenous contrast. COMPARISON:  04/25/2020 FINDINGS: Brain: No evidence of acute infarction, hemorrhage, hydrocephalus, extra-axial collection or mass lesion/mass effect. Vascular: No hyperdense vessel or unexpected calcification. Skull: Normal. Negative for fracture or focal lesion. Sinuses/Orbits: No acute finding. Other: These results were communicated to Dr Quinn Axe at 9:34 am on 06/23/2021 by text page via the [facility] messaging system. Motion artifact words the vertex. ASPECTS [facility] Stroke Program Early CT Score) - Ganglionic level infarction (caudate, lentiform nuclei, internal capsule, insula, M1-M3 cortex): 7 - Supraganglionic infarction (M4-M6 cortex): 3 Total score (0-10 with 10 being normal): 10 IMPRESSION: No acute finding. Electronically Signed   By: Jorje Guild M.D.   On: 06/23/2021 09:35    Procedures .Critical Care Performed by: Regan Lemming, MD Authorized by: Regan Lemming, MD   Critical care  provider statement:    Critical care time (minutes):  45   Critical care was necessary to treat or prevent imminent or life-threatening deterioration of the following conditions:  CNS failure or compromise   Critical care was time spent personally by me on the following activities:  Ordering and performing treatments and interventions, ordering and review of laboratory studies, ordering and review of radiographic studies, re-evaluation of patient's condition, review of old charts, examination of patient, obtaining history from patient or surrogate, development of treatment plan with patient or surrogate and discussions with consultants   Medications Ordered in ED Medications  aspirin chewable tablet 81 mg (81 mg Oral Given 06/23/21 1212)  lactated ringers bolus 1,000 mL (1,000 mLs Intravenous New Bag/Given 06/23/21 1210)  metoCLOPramide (REGLAN) injection 10 mg (10 mg Intravenous Given 06/23/21 1211)  diphenhydrAMINE (BENADRYL) injection 25 mg (25 mg Intravenous Given 06/23/21 1211)  acetaminophen (TYLENOL) tablet 1,000 mg (1,000 mg Oral Given 06/23/21 1212)  aspirin chewable tablet 162 mg (162 mg Oral Given 06/23/21 1337)  aspirin chewable tablet 81 mg (81 mg Oral Given 06/23/21 1337)  clopidogrel (PLAVIX) tablet 300 mg (300 mg Oral Given 06/23/21 1336)  iohexol (OMNIPAQUE) 350 MG/ML injection 75 mL (75 mLs Intravenous Contrast Given 06/23/21 1307)    ED Course  I have reviewed the triage vital signs and the nursing notes.  Pertinent labs & imaging results that were available during my care of the patient were reviewed by me and considered in my medical decision making (see chart for details).    MDM Rules/Calculators/A&P                           44 year old female with medical history below who presents to the emergency department with multiple complaints.  Primarily, she complains of right upper extremity weakness that began this morning around 7 AM.  She states that she has been  feeling lightheadedness for the past few days and had a near syncopal episode yesterday while ambulating.  She denies any head trauma or loss of consciousness.  She states that she was normal when she awoke this morning but acutely at 7 AM developed right upper extremity weakness.  She endorsed a heaviness in her right arm while attempting to grab a door.  She denies any  facial droop, dysarthria, diplopia, any other numbness or weakness.  She does endorse blurry vision and chest tightness.  She endorses paresthesias in her right arm.  While in triage, the patient was evaluated and found to have 4/5 strength in the right upper extremity.  Code stroke called upon patient evaluation.  On arrival, the patient was afebrile, hemodynamically stable, saturating well on room air.  Physical exam on arrival significant for 4/5 strength in the right upper extremity.  No other acute neurologic deficits noted. CT code stroke imaging was performed and was negative for acute stroke. Tele-neurology evaluated the patient and felt symptoms were more consistent with complex migraine headache.  Recommendations included MRI brain, MRA head and neck.  If negative, symptomatic management and follow-up outpatient with neurology was recommended.  12:58 PM Received call from radiology concerning the patient's MRI reading.  She has acute stroke in the right corona radiata with additional concern for possible acute right M2 MCA stenosis.   Full MRI Brain Read: 1. Mildly motion degraded exam. 2. 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 3. Additional 2 mm acute cortical infarct within the high posterior right frontal lobe (also within the right MCA vascular territory). 4. Subtle punctate acute infarct (versus artifact) within the high posterior left frontal lobe.  Neurology updated and recommended admission, CTA head and neck, loading with aspirin and Plavix.  Hospitalist  medicine consulted for admission. Patient and family updated bedside.  Final Clinical Impression(s) / ED Diagnoses Final diagnoses:  Weakness  Lightheadedness  Cerebrovascular accident (CVA), unspecified mechanism [facility])    Rx / [facility] Orders ED Discharge Orders     None        Regan Lemming, MD 06/23/21 1410    Regan Lemming, MD 06/23/21 1413    Regan Lemming, MD 06/23/21 1417

## 2021-06-23 NOTE — Consult Note (Signed)
[facility] Telemedicine Consult   Requesting Provider: Dr. Armandina Gemma  Chief Complaint: Left arm weakness, lightheadedness, chest pain  HPI:  45 year old female with history of anemia on iron pills and birth control pills presented to ER for episode of lightheadedness, right arm weakness, chest pain and headache.  Per patient, yesterday afternoon, she was getting out of the restaurant, she had episode of lightheadedness, feeling about to faint.  No arm leg weakness.  She went home and overnight doing okay.  She got up at 7 AM at her baseline, however, when she standing up trying to open the door, she felt again lightheadedness, chest pain with heart palpitation, also right arm weakness, heaviness, not able to turn the doorknob.  She came to ER for evaluation.  In ER, she started have headache on the left frontal area, throbbing pain with nausea but no vomiting.  Glucose 125, BP 110/70.  She reported she has headache comes and goes especially worsening for the last week.  She takes Tylenol for the headache usually.  She has quit smoking since on birth control pills 8 months ago.  LKW: 7 a.m. tpa given?: No, NIH score 0, symptom resolved, likely not stroke IR Thrombectomy? No, NIH score 0, no LVO sign on exam Modified Rankin Scale: 0-Completely asymptomatic and back to baseline post- stroke   Exam: Vitals:   06/23/21 0933 06/23/21 1000  BP: 139/84 137/79  Pulse: 78 82  Resp: 13 17  Temp:    SpO2: 100% 100%     Temp:  [98.7 F (37.1 C)] 98.7 F (37.1 C) (10/27 0840) Pulse Rate:  [78-107] 82 (10/27 1000) Resp:  [13-19] 17 (10/27 1000) BP: (110-139)/(70-84) 137/79 (10/27 1000) SpO2:  [100 %] 100 % (10/27 1000) Weight:  [89.8 kg] 89.8 kg (10/27 0858)  General - Well nourished, well developed, in no apparent distress.  Ophthalmologic - fundi not visualized due to noncooperation.  Cardiovascular - Regular rhythm and rate.  Neuro - awake, alert, eyes open, orientated  to age, place, time and people. No aphasia, fluent language, following all simple commands. Able to name and repeat. No gaze palsy, tracking bilaterally, visual field full, PERRL. No facial droop. Tongue midline. Bilateral UEs 5/5, no drift, b/l finger grip equal. RLE initially drift however, with encouragement, no drift. B/l ankle DF/PF equal strength. Sensation symmetrical bilaterally, b/l FTN and HTS intact, gait not tested, however per RN, she went to bathroom for urine sample was able to walk without assistance and normal gait.     NIH Stroke Scale = 0   Imaging Reviewed:  CT HEAD CODE STROKE WO CONTRAST  Result Date: 06/23/2021 CLINICAL DATA:  Code stroke. Near syncope yesterday. Right arm numbness today. EXAM: CT HEAD WITHOUT CONTRAST TECHNIQUE: Contiguous axial images were obtained from the base of the skull through the vertex without intravenous contrast. COMPARISON:  04/25/2020 FINDINGS: Brain: No evidence of acute infarction, hemorrhage, hydrocephalus, extra-axial collection or mass lesion/mass effect. Vascular: No hyperdense vessel or unexpected calcification. Skull: Normal. Negative for fracture or focal lesion. Sinuses/Orbits: No acute finding. Other: These results were communicated to Dr Quinn Axe at 9:34 am on 06/23/2021 by text page via the [facility] messaging system. Motion artifact words the vertex. ASPECTS [facility] Stroke Program Early CT Score) - Ganglionic level infarction (caudate, lentiform nuclei, internal capsule, insula, M1-M3 cortex): 7 - Supraganglionic infarction (M4-M6 cortex): 3 Total score (0-10 with 10 being normal): 10 IMPRESSION: No acute finding. Electronically Signed   By: Jorje Guild M.D.   On:  06/23/2021 09:35     Labs reviewed in epic and pertinent values follow: Cre 0.80  Assessment:  45 year old female with history of anemia on iron pills and birth control pills presented to ER for episode of lightheadedness, right arm weakness, chest pain and headache. Time  onset 7am. Currently in ER, NIHSS = 0, neuro intact. CT no acute finding.  Patient not a tPA candidate given NIH score 0 and likely not stroke.  Etiology for patient's symptoms concerning for complicated migraine, however stroke/TIA still in differential.  Recommend MRI brain, MRA head and neck stat to rule out stroke.  Aspirin 81 in the meantime.  Headache per EDP.  If MRI negative, and she cannot ambulate in ER without difficulty, patient can be discharged from neuro standpoint from ER with close outpatient neurology follow-up.  Will refer to neurology clinic for migraine management.  Also discussed with patient about birth control pills, recommend her to discuss with her OB doctor to consider progesterone alone birth control pills.   Recommendations:  Frequent neuro checks Telemetry monitoring MRI brain stat MRA head and neck stat UDS, fasting lipid panel and HgbA1C Aspirin 81 in the meantime Headache per EDP.   If MRI negative, and she cannot ambulate in ER without difficulty, patient can be discharged from neuro standpoint from ER with close outpatient neurology follow-up.   recommend to consider progesterone alone birth control pills. Discussed with Dr. Armandina Gemma ED physician   Consult Participants: Patient, RN, stroke response RN Location of the provider: [facility] ED Location of the patient: [facility]  This consult was provided via telemedicine with 2-way video and audio communication. The patient/family was informed that care would be provided in this way and agreed to receive care in this manner.   This patient is receiving care for possible acute neurological changes. There was 50 minutes of care by this provider at the time of service, including time for direct evaluation via telemedicine, review of medical records, imaging studies and discussion of findings with providers, the patient and/or family.  Rosalin Hawking, MD PhD Stroke Neurology 06/23/2021 10:07 AM

## 2021-06-23 NOTE — ED Notes (Signed)
Pressed button for Code Stroke cart, gave the code stroke RN the demographic information for this patient. She is paging the Stroke, MD. Patient is in the bathroom getting urine sample.

## 2021-06-23 NOTE — ED Notes (Signed)
Patient transported to MRI 

## 2021-06-23 NOTE — Progress Notes (Signed)
MRI reviewed and it showed right insular cortex small diffusion abnormalities. However, this is not correlating to pt complain of right sided weakness at symptoms onset. Also the DWI signal is vague, will need to repeat MRI with and without contrast tomorrow for further evaluation.   MRA head reported occlusion vs. High grade stenosis of a proximal to mid right M2. However, on my review, the MRA source imaging is hard to read for distal small vessel and it only showed possible right mild to distal M3 vessel loss of signal not sure if true occlusion or stenosis.   At this time, we recommend CTA head and neck, repeat MRI brain with and without contrast tomorrow. Admit to medicine service for stroke work up with echo, A1C LDL, hypercoagulable labs, likely TCD bubble study.   She received ASA 81 already but will give plavix 300mg  load stat. Will do DAPT from tomorrow, pending further work up. Discussed with Dr. Armandina Gemma at [facility].   Rosalin Hawking, MD PhD Stroke Neurology 06/23/2021 1:06 PM

## 2021-06-23 NOTE — Plan of Care (Signed)
  Problem: Education: Goal: Knowledge of General Education information will improve Description: Including pain rating scale, medication(s)/side effects and non-pharmacologic comfort measures Outcome: Progressing   Problem: Clinical Measurements: Goal: Ability to maintain clinical measurements within normal limits will improve Outcome: Progressing   Problem: Coping: Goal: Level of anxiety will decrease Outcome: Progressing   Problem: Elimination: Goal: Will not experience complications related to bowel motility Outcome: Progressing   Problem: Safety: Goal: Ability to remain free from injury will improve Outcome: Progressing   Problem: Skin Integrity: Goal: Risk for impaired skin integrity will decrease Outcome: Progressing   Problem: Education: Goal: Knowledge of disease or condition will improve Outcome: Progressing

## 2021-06-24 ENCOUNTER — Other Ambulatory Visit: Payer: Self-pay

## 2021-06-24 ENCOUNTER — Inpatient Hospital Stay (HOSPITAL_COMMUNITY): Payer: 59

## 2021-06-24 DIAGNOSIS — I639 Cerebral infarction, unspecified: Secondary | ICD-10-CM

## 2021-06-24 DIAGNOSIS — E7800 Pure hypercholesterolemia, unspecified: Secondary | ICD-10-CM | POA: Diagnosis not present

## 2021-06-24 DIAGNOSIS — I6389 Other cerebral infarction: Secondary | ICD-10-CM

## 2021-06-24 LAB — LIPID PANEL
Cholesterol: 179 mg/dL (ref 0–200)
HDL: 61 mg/dL (ref >40)
LDL Cholesterol: 95 mg/dL (ref 0–99)
Total CHOL/HDL Ratio: 2.9 RATIO
Triglycerides: 115 mg/dL (ref <150)
VLDL: 23 mg/dL (ref 0–40)

## 2021-06-24 LAB — ECHOCARDIOGRAM COMPLETE
AR max vel: 2.29 cm2
AV Peak grad: 8.1 mmHg
Ao pk vel: 1.43 m/s
Area-P 1/2: 4.8 cm2
Calc EF: 52.9 %
Height: 66 in
S' Lateral: 3.8 cm
Single Plane A2C EF: 52.1 %
Single Plane A4C EF: 51.5 %
Weight: 3168 oz

## 2021-06-24 LAB — SEDIMENTATION RATE: Sed Rate: 23 mm/hr — ABNORMAL HIGH (ref 0–22)

## 2021-06-24 LAB — HIV ANTIBODY (ROUTINE TESTING W REFLEX): HIV Screen 4th Generation wRfx: NONREACTIVE

## 2021-06-24 LAB — GLUCOSE, CAPILLARY: Glucose-Capillary: 122 mg/dL — ABNORMAL HIGH (ref 70–99)

## 2021-06-24 LAB — T4, FREE: Free T4: 0.98 ng/dL (ref 0.61–1.12)

## 2021-06-24 LAB — TSH: TSH: 1.964 u[IU]/mL (ref 0.350–4.500)

## 2021-06-24 LAB — VITAMIN B12: Vitamin B-12: 587 pg/mL (ref 180–914)

## 2021-06-24 LAB — HEMOGLOBIN A1C
Hgb A1c MFr Bld: 6.3 % — ABNORMAL HIGH (ref 4.8–5.6)
Mean Plasma Glucose: 134.11 mg/dL

## 2021-06-24 LAB — ANTITHROMBIN III: AntiThromb III Func: 111 % (ref 75–120)

## 2021-06-24 LAB — C-REACTIVE PROTEIN: CRP: 0.6 mg/dL (ref <1.0)

## 2021-06-24 IMAGING — MR MR HEAD WO/W CM
9 of 12 series · 33 of 48 positions shown · IV contrast (Yes   GAD)
Comparison: CT head without contrast [DATE]. MR head without
contrast [DATE]

CLINICAL DATA: Stroke, follow-up.

EXAM:
MRI HEAD WITHOUT AND WITH CONTRAST
TECHNIQUE: Multiplanar, multiecho pulse sequences of the brain and surrounding
structures were obtained without and with intravenous contrast.
CONTRAST:  8.9mL GADAVIST GADOBUTROL 1 MMOL/ML IV SOLN

[Series 3: DWI · axial · 3.0mm · 1.09mm/px · z∈[-81,+64]mm · 8 of 100 slices shown (1 of 4)]
[im 1/100]
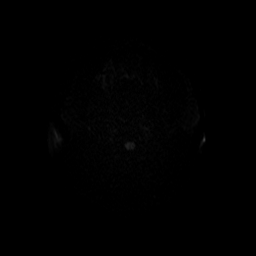
[im 12/100]
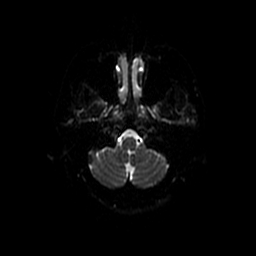
[im 34/100]
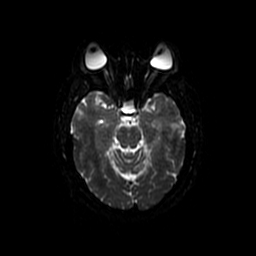
[im 45/100]
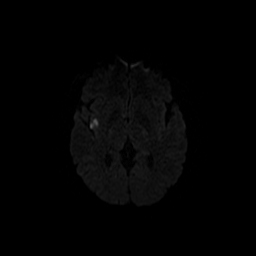
[im 56/100]
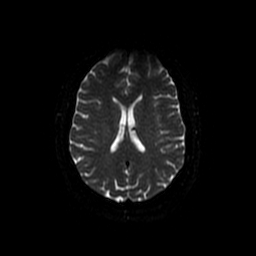
[im 67/100]
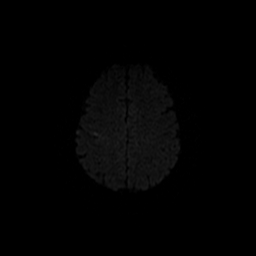
[im 89/100]
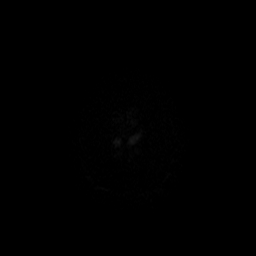
[im 100/100]
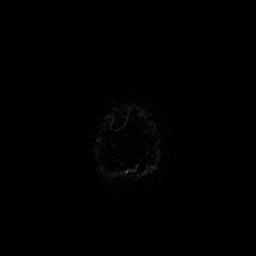

[Series 4: DWI · coronal · 5.0mm · 1.09mm/px · 6 of 74 slices shown (2 of 4)]
[im 1/74]
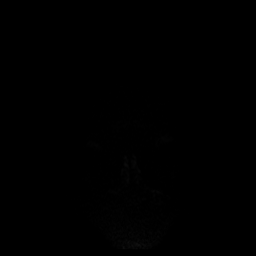
[im 15/74]
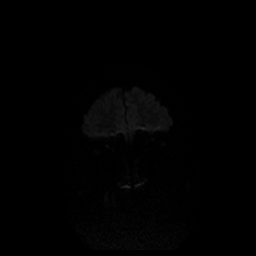
[im 30/74]
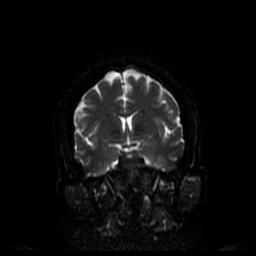
[im 44/74]
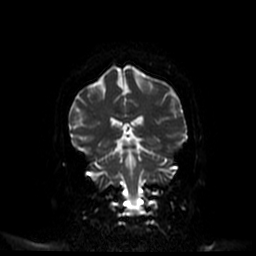
[im 59/74]
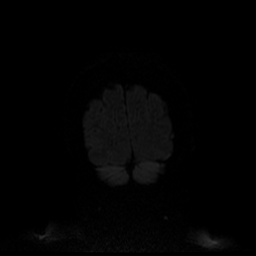
[im 74/74]
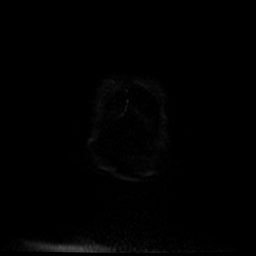

[Series 6: T2 · axial · 5.0mm · 0.43mm/px · z∈[-74,+69]mm · 2 of 25 slices shown]
[im 1/25]
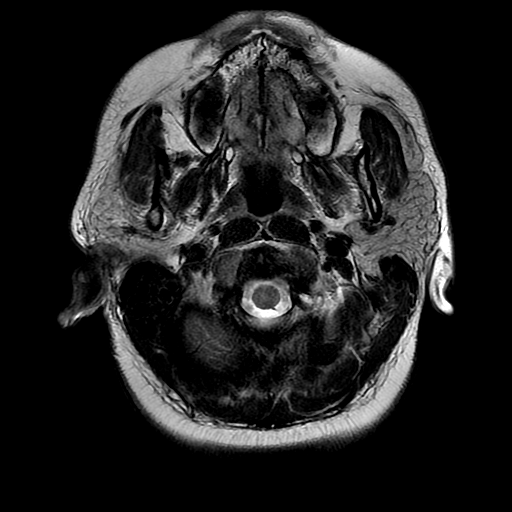
[im 25/25]
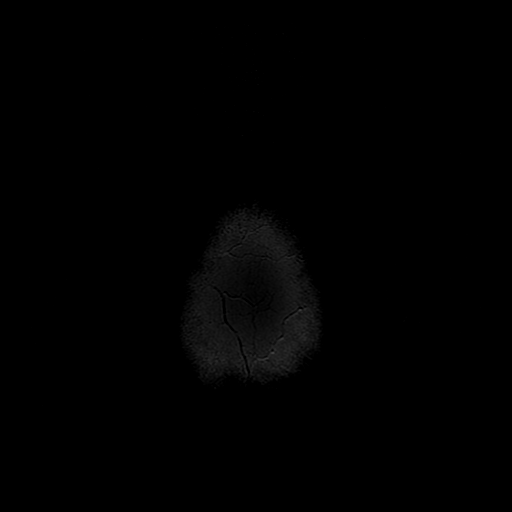

[Series 7: FLAIR · axial · 3.0mm · 0.43mm/px · z∈[-74,+69]mm · 2 of 25 slices shown]
[im 1/25]
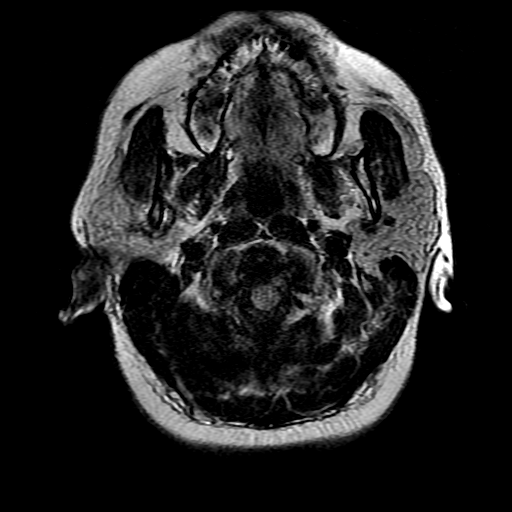
[im 25/25]
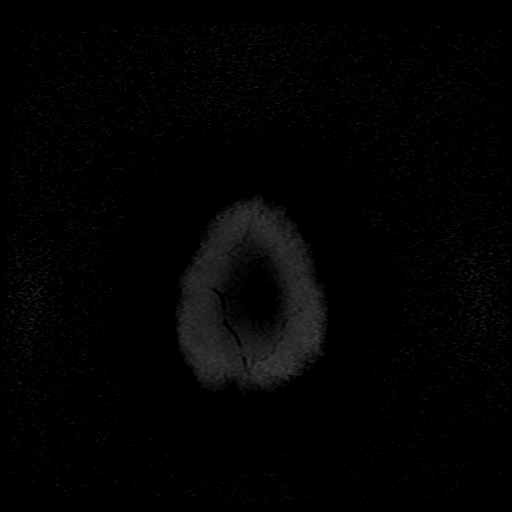

[Series 10: T2 post-contrast · coronal · 5.0mm · 0.39mm/px · 2 of 28 slices shown]
[im 1/28]
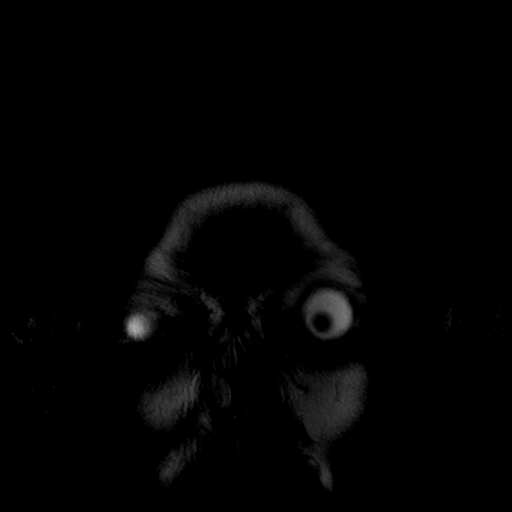
[im 28/28]
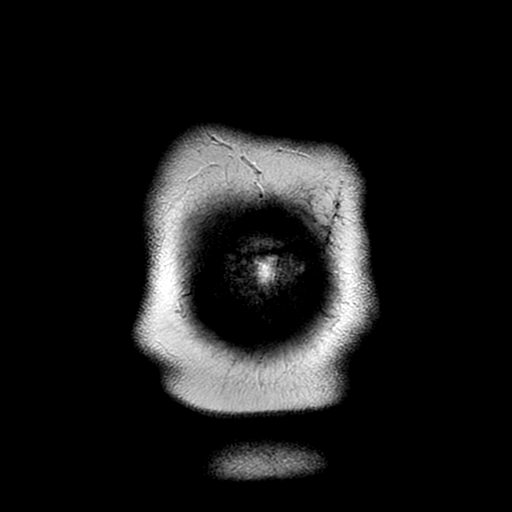

[Series 13: T1 post-contrast · axial · 3.0mm · 0.47mm/px · z∈[-89,+57]mm · 4 of 50 slices shown (1 of 2)]
[im 1/50]
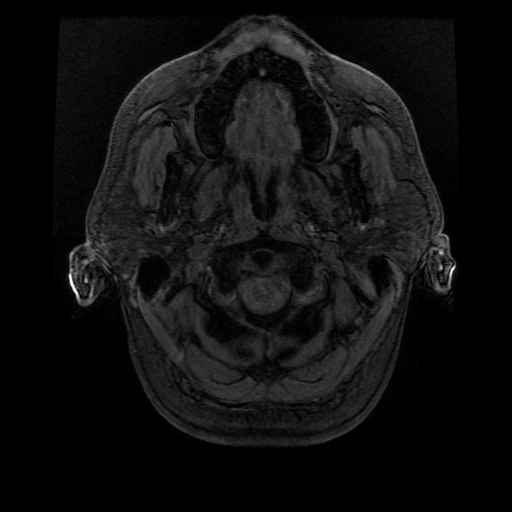
[im 17/50]
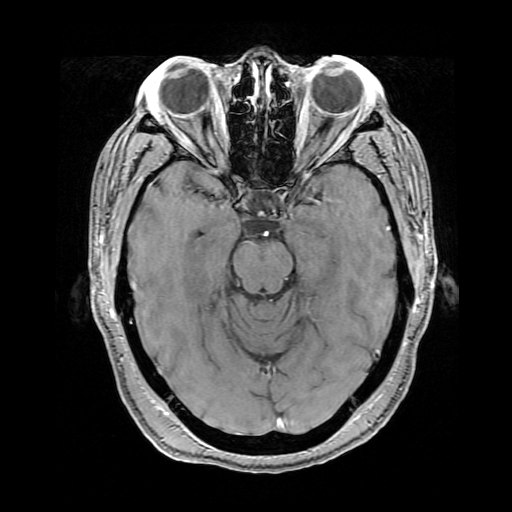
[im 33/50]
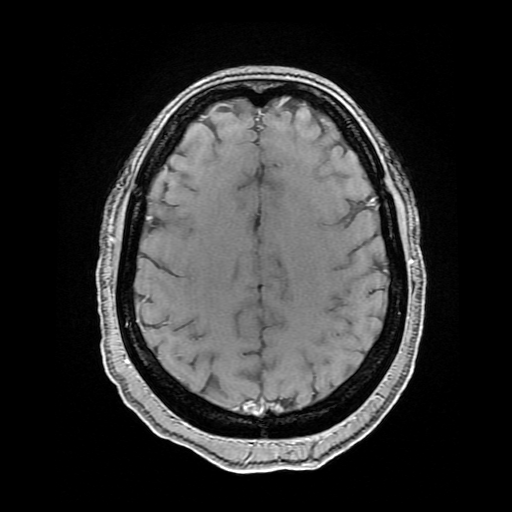
[im 50/50]
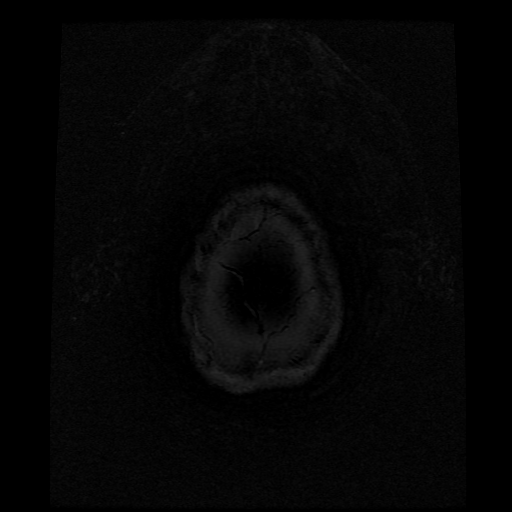

[Series 14: T1 post-contrast · coronal · 5.0mm · 0.39mm/px · 2 of 29 slices shown (2 of 2)]
[im 1/29]
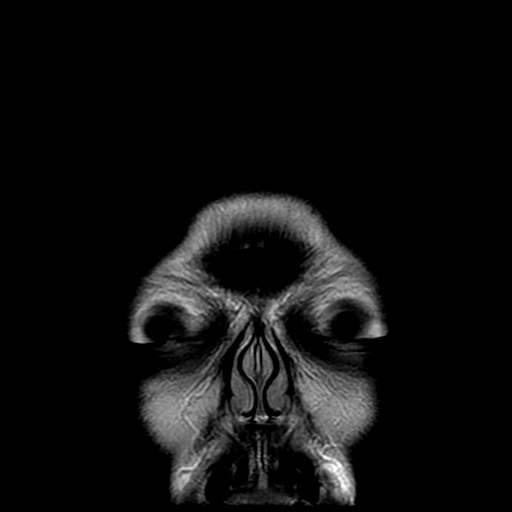
[im 29/29]
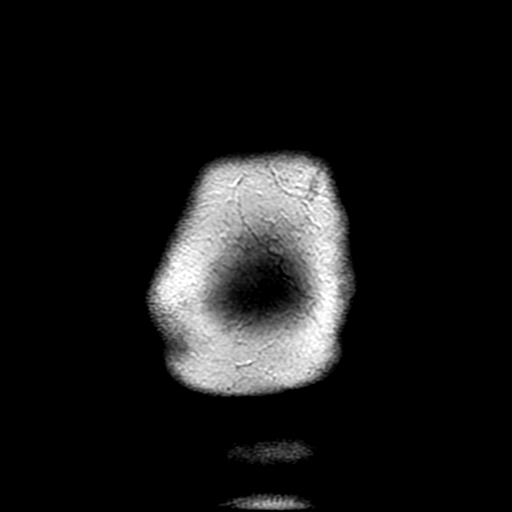

[Series 300: DWI · axial · 3.0mm · 1.09mm/px · z∈[-81,+64]mm · 4 of 50 slices shown (3 of 4)]
[im 1/50]
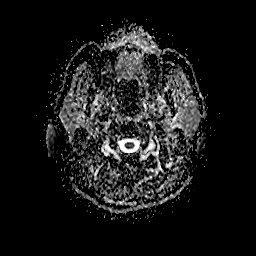
[im 17/50]
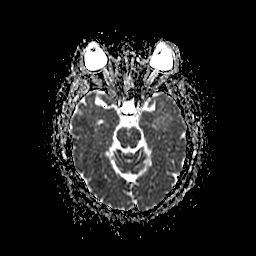
[im 33/50]
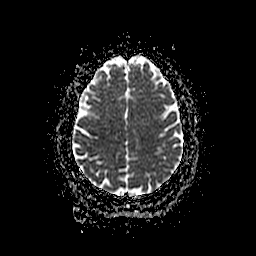
[im 50/50]
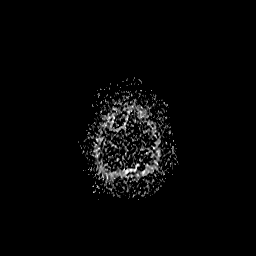

[Series 400: DWI · coronal · 5.0mm · 1.09mm/px · 3 of 37 slices shown (4 of 4)]
[im 1/37]
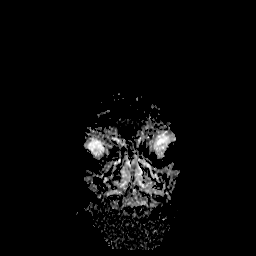
[im 19/37]
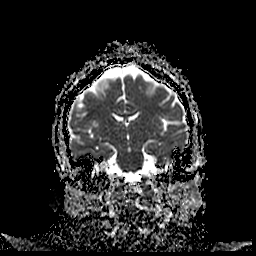
[im 37/37]
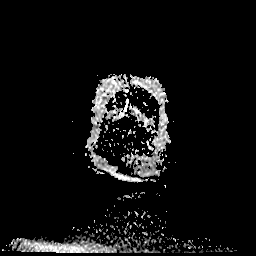

[33 of 48 positions shown; findings below may reference images not displayed]

FINDINGS: Brain: The acute infarct involving the right insular cortex and
external capsule is stable. Additional restricted diffusion is now
noted along the precentral gyrus on the right. This may correspond
to additional left-sided weakness. The punctate focus in the right
middle frontal gyrus is again seen. Punctate focus in the left
superior frontal gyrus continues to fade. Additional punctate foci
are noted in the high anterior right parietal lobe.

T2 signal changes are associated with the areas of acute infarction.
No acute hemorrhage or mass lesion is present.

Minimal white matter changes are present otherwise.

The internal auditory canals are within normal limits. The brainstem
and cerebellum are within normal limits.

The ventricles are of normal size. No significant extraaxial fluid
collection is present.

Vascular: Flow is present in the major intracranial arteries.

Skull and upper cervical spine: The craniocervical junction is
normal. Upper cervical spine is within normal limits. Marrow signal
is unremarkable.

Sinuses/Orbits: The paranasal sinuses and mastoid air cells are
clear. The globes and orbits are within normal limits.
IMPRESSION: 1. Stable appearance of acute/subacute nonhemorrhagic infarct
involving the right insular cortex and external capsule. Stable
appearance of acute/subacute nonhemorrhagic infarcts in the anterior
frontal lobes bilaterally.
2. Additional punctate foci of acute nonhemorrhagic infarction
involving the right precentral gyrus and right parietal lobe.
Question ongoing embolic source of these infarctions.

## 2021-06-24 IMAGING — CT CT HEAD W/O CM
4 series · 17 of 47 positions shown, 19 images · non-contrast
Comparison: Plain brain CT and MR head, dated [DATE]

CLINICAL DATA: Stroke follow-up.

EXAM:
CT HEAD WITHOUT CONTRAST
TECHNIQUE: Contiguous axial images were obtained from the base of the skull
through the vertex without intravenous contrast.

[Series 5: head without · axial · non-contrast · 0.47mm/px · z∈[-82,+38]mm · 7 of 32 slices shown, 9 images]
[im 4/32  brain]
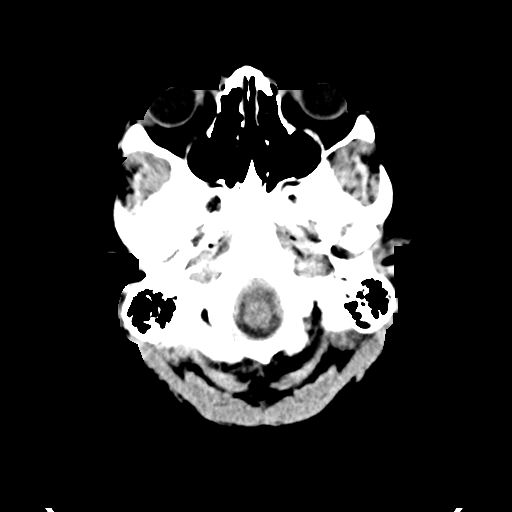
[im 4/32  bone]
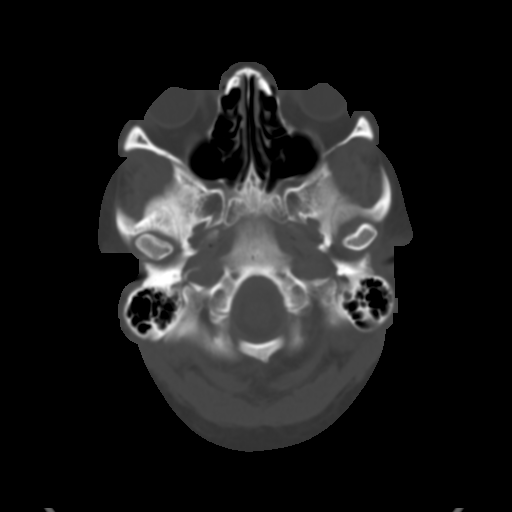
[im 8/32  brain]
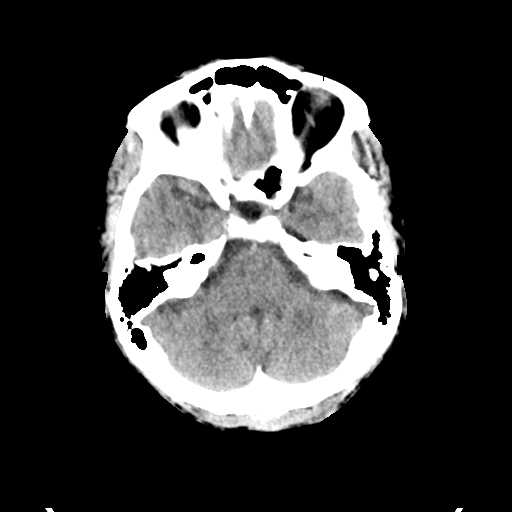
[im 12/32  brain]
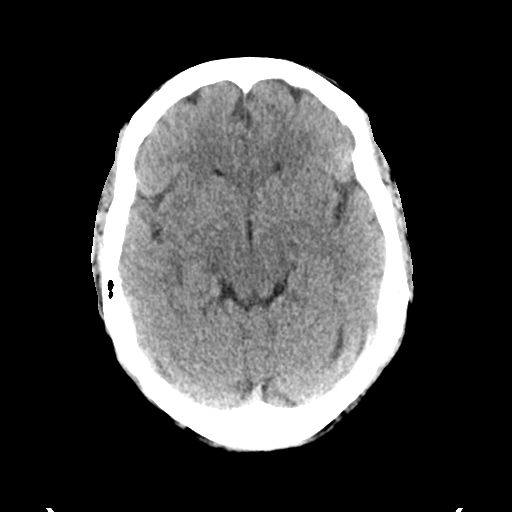
[im 16/32  brain]
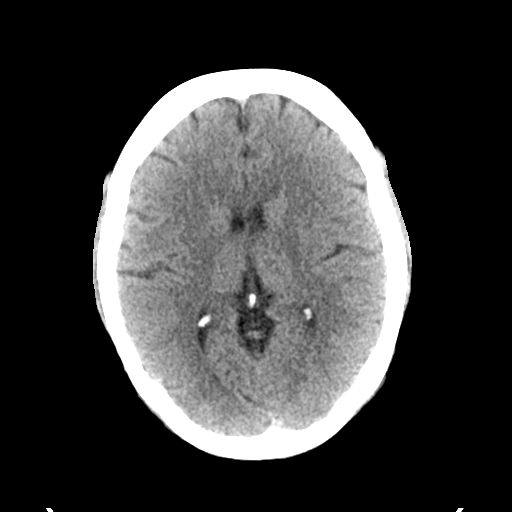
[im 20/32  brain]
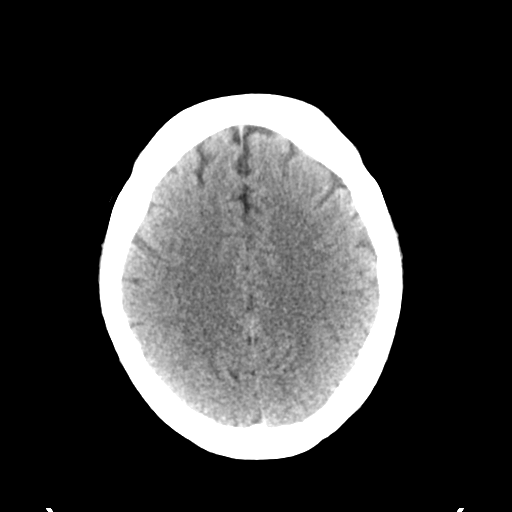
[im 20/32  bone]
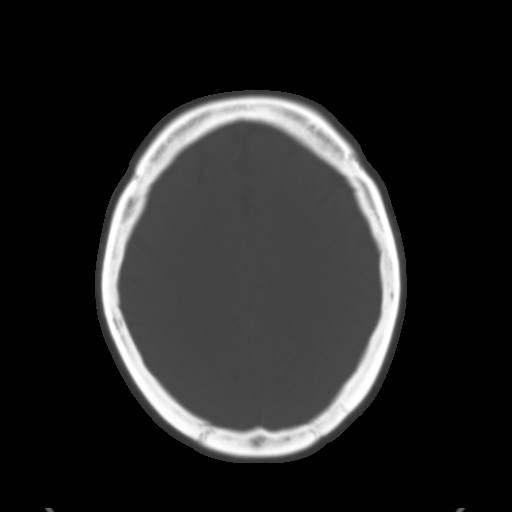
[im 24/32  brain]
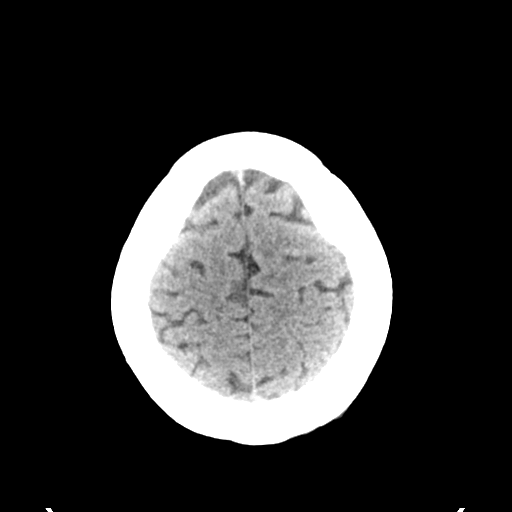
[im 28/32  brain]
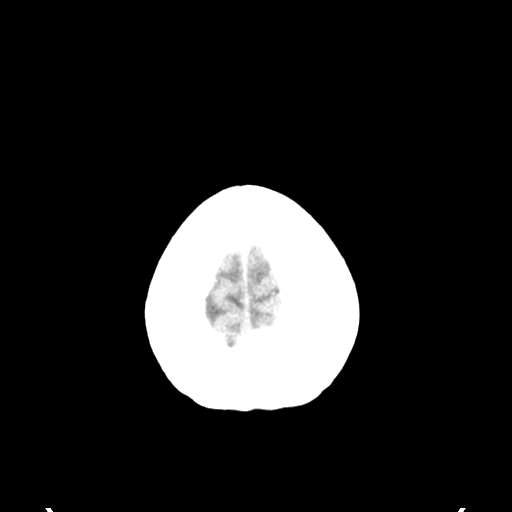

[Series 6: head bone · axial · 0.47mm/px · z∈[-83,-27]mm · 4 of 80 slices shown]
[im 8/80  bone]
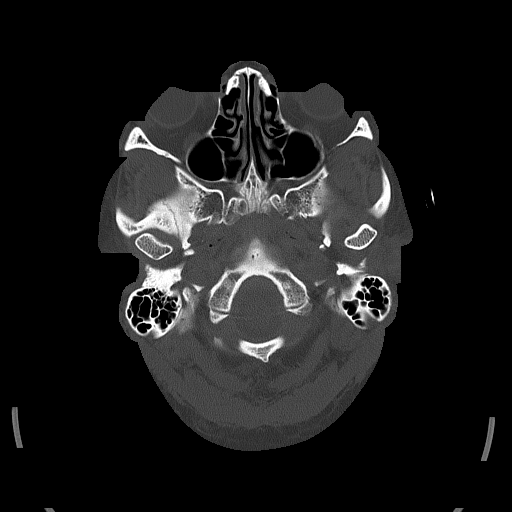
[im 16/80  bone]
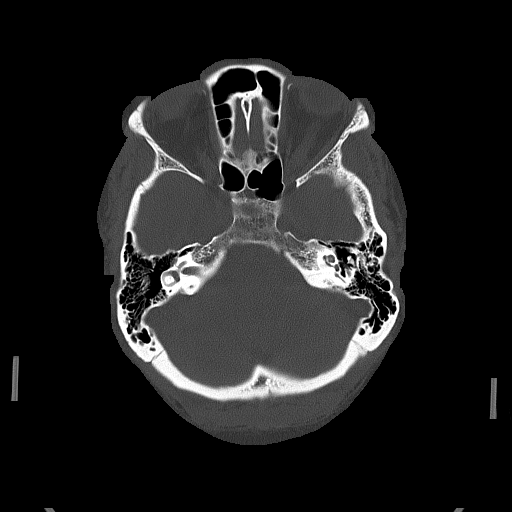
[im 24/80  bone]
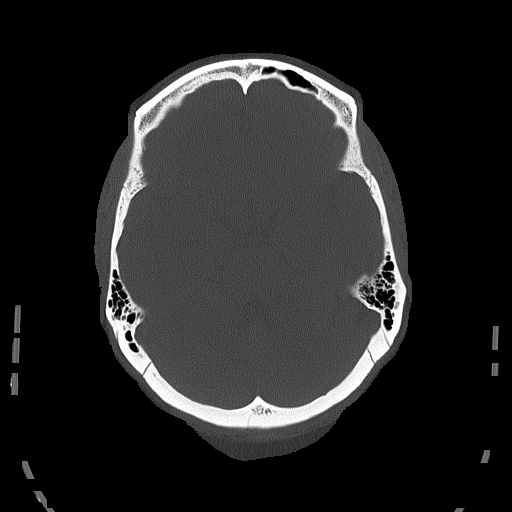
[im 36/80  bone]
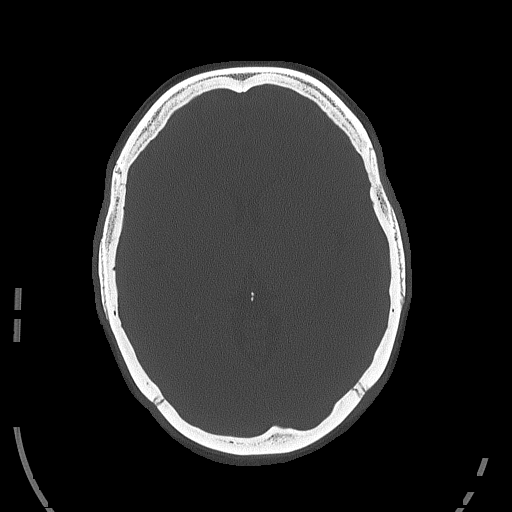

[Series 7: head without cor · coronal · non-contrast · 0.33mm/px · 3 of 61 slices shown]
[im 21/61  brain]
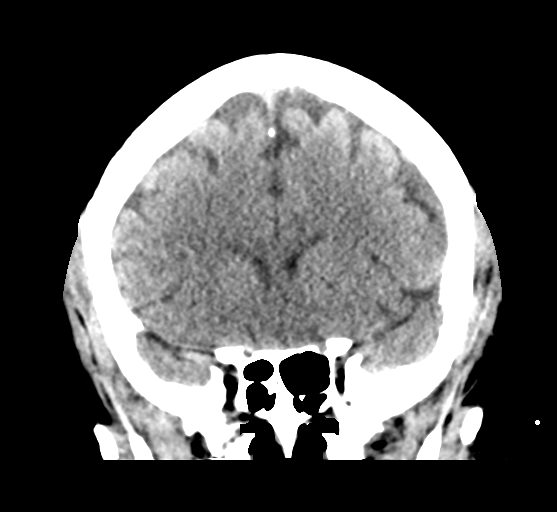
[im 27/61  brain]
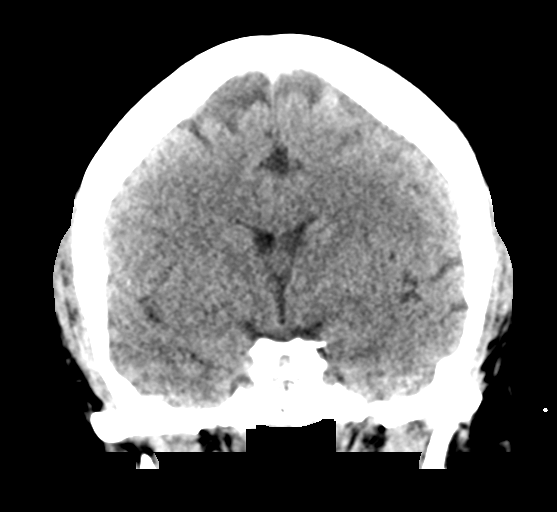
[im 34/61  brain]
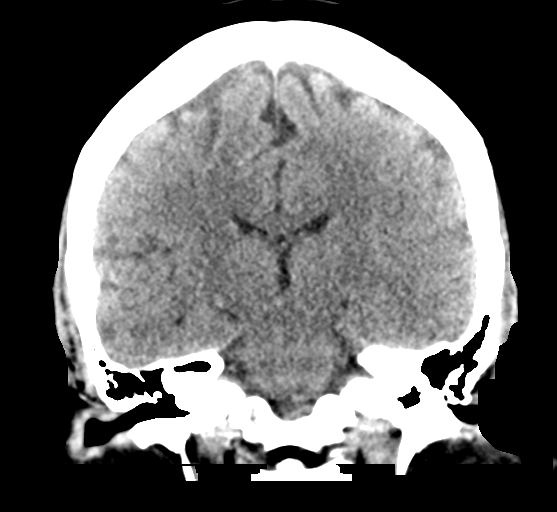

[Series 8: head without sag · sagittal · non-contrast · 0.34mm/px · 3 of 49 slices shown]
[im 17/49  brain]
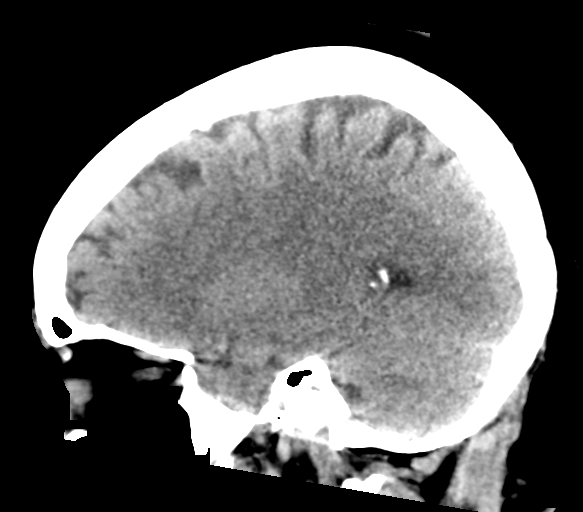
[im 25/49  brain]
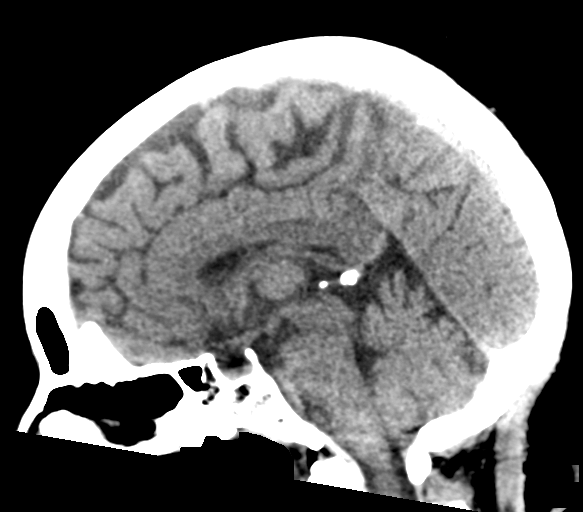
[im 33/49  brain]
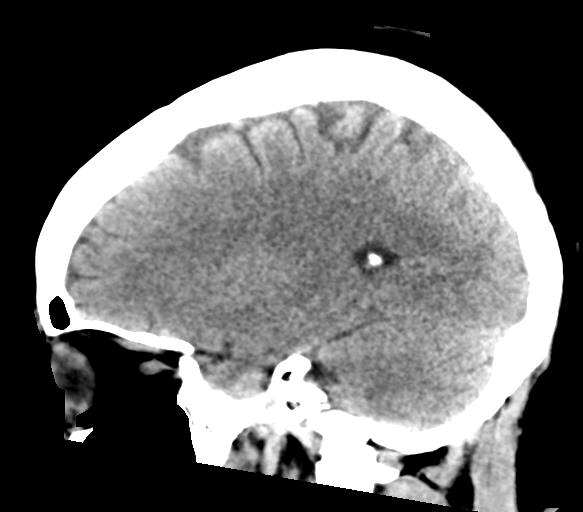

[17 of 47 positions shown; findings below may reference images not displayed]

FINDINGS: Brain: No evidence of acute infarction, hemorrhage, hydrocephalus,
extra-axial collection or mass lesion/mass effect.

Vascular: No hyperdense vessel or unexpected calcification.

Skull: Normal. Negative for fracture or focal lesion.

Sinuses/Orbits: No acute finding.

Other: None.
IMPRESSION: 1. No acute intracranial pathology.
2. No CT abnormality to correspond to the acute infarct within the
right corona radiata seen on the prior MR head. Correlation with
follow-up MRI is recommended.

## 2021-06-24 MED ORDER — DIPHENHYDRAMINE HCL 50 MG/ML IJ SOLN: 50.0000 mg | Freq: Once | INTRAMUSCULAR | Status: AC

## 2021-06-24 MED ORDER — DIPHENHYDRAMINE HCL 50 MG/ML IJ SOLN
INTRAMUSCULAR | Status: AC
  Administered 2021-06-24: 50 mg via INTRAVENOUS
  Filled 2021-06-24: qty 1

## 2021-06-24 MED ORDER — ATORVASTATIN CALCIUM 40 MG PO TABS
40.0000 mg | ORAL_TABLET | Freq: Every day | ORAL | Status: DC
  Administered 2021-06-24 – 2021-06-25 (×2): 40 mg via ORAL
  Filled 2021-06-24 (×2): qty 1

## 2021-06-24 MED ORDER — SODIUM CHLORIDE 0.9 % IV BOLUS
1000.0000 mL | Freq: Once | INTRAVENOUS | Status: AC
  Administered 2021-06-24: 1000 mL via INTRAVENOUS

## 2021-06-24 MED ORDER — GADOBUTROL 1 MMOL/ML IV SOLN
8.9000 mL | Freq: Once | INTRAVENOUS | Status: AC | PRN
  Administered 2021-06-24: 8.9 mL via INTRAVENOUS

## 2021-06-24 NOTE — Progress Notes (Signed)
TCD bubble study has been completed.   Preliminary results in CV Proc.   Jinny Blossom Buna Cuppett 06/24/2021 2:31 PM

## 2021-06-24 NOTE — Progress Notes (Signed)
Phone call from MRI extender stating this pt had a reaction to the MRI contrast and is in need of medication per MD/PA. Orders received. See MAR. Pt awake alert and oriented x4. Pt states that she is itching all over and that her throat is scratchy. After medication administered, pt assisted to sitting position. Phone call to pt floor RN to assist with transport back to floor.

## 2021-06-24 NOTE — Progress Notes (Signed)
Radiology team called to MRI to evaluate for possible contrast allergy. Patient complained of generalized itchiness. She had a mild cough and her voice sounded slightly horse. Airway found to be clear, good air movement auscultated over the lung fields. Some facial swelling noted. 50 mg IV benadryl administered by IR RN. Patient advised to let healthcare team know if she developed trouble breathing or swallowing.  Soyla Dryer, [place] [phone] 06/24/2021, 12:37 PM

## 2021-06-24 NOTE — Plan of Care (Signed)
  Problem: Elimination: Goal: Will not experience complications related to bowel motility Outcome: Progressing   Problem: Pain Managment: Goal: General experience of comfort will improve Outcome: Progressing   Problem: Safety: Goal: Ability to remain free from injury will improve Outcome: Progressing   Problem: Skin Integrity: Goal: Risk for impaired skin integrity will decrease Outcome: Progressing   Problem: Education: Goal: Knowledge of disease or condition will improve Outcome: Progressing

## 2021-06-24 NOTE — Progress Notes (Signed)
Report given to [facility] RN at pt MRI bedside

## 2021-06-24 NOTE — Evaluation (Signed)
Physical Therapy Evaluation/ Discharge Patient Details Name: Angela Hahn MRN: [id number] DOB: 18-Oct-1975 Today's Date: 06/24/2021  History of Present Illness  45 yo female admitted 10/27 with RUE weakness. MRI showed right insular cortex small diffusion abnormalities however neurology await 2nd MRI as this does not explain RUE weakness. 10/28 at 1am pt also with LUE weakness. 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 She is left handed and enjoys bowling. Pt with no strength or sensation deficits of bil LE with LUE coordination and strength her greatest challenge. Pt does report that cognitive processing seems to be slower than normal and pt noted to have dizziness with all downward gaze in standing requiring seated rest or standing stop with vision adjusted to eye level gaze. Pt and family educated for BE FAST as well as increased risk of CVA. Pt able to perform all basic tranfers, gait, stairs and function without assist with pt educated for accommodation of dizziness and safety on stairs. Will defer all further needs to OT at this time. Pt and family aware and agreeable. Will sign off.     BP after gait 143/82 (99) Stood with downward gaze with dizziness and BP 163/113 (130), HR 110     Recommendations for follow up therapy are one component of a multi-disciplinary discharge planning process, led by the attending physician.  Recommendations may be updated based on patient status, additional functional criteria and insurance authorization.  Follow Up Recommendations Other (comment) (will defer to OPOT)    Assistance Recommended at Discharge Intermittent Supervision/Assistance  Functional Status Assessment Patient has had a recent decline in their functional status and demonstrates the ability  to make significant improvements in function in a reasonable and predictable amount of time.  Equipment Recommendations  None recommended by PT    Recommendations for Other Services       Precautions / Restrictions Precautions Precautions: Fall Precaution Comments: lightheaded/dizzy with looking down Restrictions Weight Bearing Restrictions: No      Mobility  Bed Mobility Overal bed mobility: Independent             General bed mobility comments: EOB on arrival    Transfers Overall transfer level: Independent                      Ambulation/Gait Ambulation/Gait assistance: Supervision Gait Distance (Feet): 300 Feet Assistive device: None Gait Pattern/deviations: WFL(Within Functional Limits)   Gait velocity interpretation: >4.37 ft/sec, indicative of normal walking speed General Gait Details: pt able to complete vertical and horizontal head turns, change of speed and direction. With looking down pt gets dizzy and has to stop  Stairs Stairs: Yes Stairs assistance: Supervision Stair Management: Alternating pattern;Forwards;One rail Right Number of Stairs: 20 General stair comments: pt able to ascend stairs alternating without difficulty. With descent pt with dizziness requiring sitting after 8 steps prior to completion of stairs.  Wheelchair Mobility    Modified Rankin (Stroke Patients Only) Modified Rankin (Stroke Patients Only) Pre-Morbid Rankin Score: No symptoms Modified Rankin: Moderate disability     Balance Overall balance assessment: No apparent balance deficits (not formally assessed)  Pertinent Vitals/Pain Pain Assessment: No/denies pain    Home Living Family/patient expects to be discharged to:: Private residence Living Arrangements: Children Available Help at Discharge: Family;Available PRN/intermittently Type of Home: Apartment Home Access: Stairs to enter Entrance  Stairs-Rails: Right Entrance Stairs-Number of Steps: 10   Home Layout: One level Home Equipment: None      Prior Function Prior Level of Function : Independent/Modified Independent             Mobility Comments: Very active, no deficits. Works in [employer] ADLs Comments: independent     Journalist, newspaper   Dominant Hand: Left    Extremity/Trunk Assessment   Upper Extremity Assessment Upper Extremity Assessment: Defer to OT evaluation LUE Deficits / Details: Weakness noted to L forearm pronation, wrist flexion: grossly 3+/5, and L grip: grossly 2/5.  She has 4/5 to L elbow and L shoulder. LUE Sensation: WNL LUE Coordination: decreased fine motor    Lower Extremity Assessment Lower Extremity Assessment: Overall WFL for tasks assessed    Cervical / Trunk Assessment Cervical / Trunk Assessment: Normal  Communication   Communication: No difficulties  Cognition Arousal/Alertness: Awake/alert Behavior During Therapy: WFL for tasks assessed/performed Overall Cognitive Status: Within Functional Limits for tasks assessed                                          General Comments      Exercises     Assessment/Plan    PT Assessment Patient does not need any further PT services  PT Problem List         PT Treatment Interventions      PT Goals (Current goals can be found in the Care Plan section)  Acute Rehab PT Goals PT Goal Formulation: All assessment and education complete, DC therapy    Frequency     Barriers to discharge        Co-evaluation               AM-PAC PT "6 Clicks" Mobility  Outcome Measure Help needed turning from your back to your side while in a flat bed without using bedrails?: None Help needed moving from lying on your back to sitting on the side of a flat bed without using bedrails?: None Help needed moving to and from a bed to a chair (including a wheelchair)?: None Help needed standing up from a chair using your arms  (e.g., wheelchair or bedside chair)?: A Little Help needed to walk in hospital room?: A Little Help needed climbing 3-5 steps with a railing? : A Little 6 Click Score: 21    End of Session   Activity Tolerance: Patient tolerated treatment well Patient left: in chair;with call bell/phone within reach;with family/visitor present Nurse Communication: Mobility status PT Visit Diagnosis: Other abnormalities of gait and mobility (R26.89)    Time: 1950-9326 PT Time Calculation (min) (ACUTE ONLY): 28 min   Charges:   PT Evaluation $PT Eval Moderate Complexity: 1 Mod PT Treatments $Therapeutic Activity: 8-22 mins        Dayne Dekay P, PT Acute Rehabilitation Services Pager: [phone] Office: [place] 06/24/2021, 1:03 PM

## 2021-06-24 NOTE — Evaluation (Signed)
Occupational Therapy Evaluation Patient Details Name: Angela Hahn MRN: [id number] DOB: 1976/02/27 Today's Date: 06/24/2021   History of Present Illness 45 yo female admitted 10/27 with RUE weakness. MRI showed right insular cortex small diffusion abnormalities however neurology await 2nd MRI as this does not explain RUE weakness. 10/28 at 1am pt also with LUE weakness. PMhx: anemia   Clinical Impression   Patient admitted for the diagnosis above.  PTA she lives in an apartment with her son, who can assist as needed.  She also has additional family members that can help if needed.  She works full time, and had no deficits to mobility, ADL/IADL.  Deficits are listed below, but weakness to LUE more distally is impacting her independence as compared to her prior level of function.  OT will follow in the acute setting, help progress her HEP, and educate on compensatory techniques.  Outpatient OT can be considered depending on progress.  She continues to undergo workup, as new L sided weakness was found 10/28, her R sided deficits appear to have resolved.             Recommendations for follow up therapy are one component of a multi-disciplinary discharge planning process, led by the attending physician.  Recommendations may be updated based on patient status, additional functional criteria and insurance authorization.   Follow Up Recommendations  Outpatient OT    Assistance Recommended at Discharge PRN  Functional Status Assessment  Patient has had a recent decline in their functional status and demonstrates the ability to make significant improvements in function in a reasonable and predictable amount of time.  Equipment Recommendations  None recommended by OT    Recommendations for Other Services       Precautions / Restrictions Precautions Precautions: Fall Precaution Comments: Patient will complain of feeling flush/light headed. Restrictions Weight Bearing Restrictions: No       Mobility Bed Mobility Overal bed mobility: Independent                  Transfers Overall transfer level: Independent                        Balance Overall balance assessment: No apparent balance deficits (not formally assessed)                                         ADL either performed or assessed with clinical judgement   ADL   Eating/Feeding: Set up;With adaptive utensils   Grooming: Set up       Lower Body Bathing: Supervison/ safety;Sit to/from stand       Lower Body Dressing: Supervision/safety;Sit to/from stand                 General ADL Comments: Weak grip makes it difficult to incorporate L hand     Vision Baseline Vision/History: 1 Wears glasses Patient Visual Report: No change from baseline Additional Comments: Patient stated that 2 days ago she perceived that her vision was blurred, but resolved now.     Perception Perception Perception: Within Functional Limits   Praxis Praxis Praxis: Intact    Pertinent Vitals/Pain Pain Assessment: No/denies pain     Hand Dominance Left   Extremity/Trunk Assessment Upper Extremity Assessment Upper Extremity Assessment: LUE deficits/detail LUE Deficits / Details: Weakness noted to L forearm pronation, wrist flexion: grossly 3+/5, and L grip: grossly  2/5.  She has 4/5 to L elbow and L shoulder. LUE Sensation: WNL LUE Coordination: decreased fine motor   Lower Extremity Assessment Lower Extremity Assessment: Defer to PT evaluation   Cervical / Trunk Assessment Cervical / Trunk Assessment: Normal   Communication Communication Communication: No difficulties   Cognition Arousal/Alertness: Awake/alert Behavior During Therapy: WFL for tasks assessed/performed Overall Cognitive Status: Within Functional Limits for tasks assessed                                       General Comments   VSS on RA    Exercises  Fist pumps and wrist flex/ext to  L   Shoulder Instructions      Home Living Family/patient expects to be discharged to:: Private residence Living Arrangements: Children Available Help at Discharge: Family;Available PRN/intermittently Type of Home: Apartment Home Access: Stairs to enter Entrance Stairs-Number of Steps: 7 Entrance Stairs-Rails: Right Home Layout: One level     Bathroom Shower/Tub: Teacher, early years/pre: Standard     Home Equipment: None          Prior Functioning/Environment Prior Level of Function : Independent/Modified Independent             Mobility Comments: Very active, no deficits ADLs Comments: 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, works FT in an office setting        OT Problem List: Decreased strength;Decreased range of motion;Impaired UE functional use      OT Treatment/Interventions: Self-care/ADL training;Therapeutic activities;Therapeutic exercise;Patient/family education    OT Goals(Current goals can be found in the care plan section) Acute Rehab OT Goals Patient Stated Goal: Get my hand moving again OT Goal Formulation: With patient Time For Goal Achievement: 07/08/21 Potential to Achieve Goals: Good ADL Goals Pt Will Perform Eating: with modified independence;sitting Pt Will Perform Grooming: with modified independence;standing Pt Will Perform Lower Body Bathing: Independently;sit to/from stand Pt Will Perform Lower Body Dressing: Independently;sit to/from stand Pt/caregiver will Perform Home Exercise Program: Increased strength;Increased ROM;Left upper extremity;With theraputty;Independently;With written HEP provided  OT Frequency: Min 2X/week   Barriers to D/C:    none noted       Co-evaluation              AM-PAC OT "6 Clicks" Daily Activity     Outcome Measure Help from another person eating meals?: None Help from another person taking care of personal grooming?: None Help from another person toileting, which includes using toliet, bedpan, or  urinal?: None Help from another person bathing (including washing, rinsing, drying)?: None Help from another person to put on and taking off regular upper body clothing?: None Help from another person to put on and taking off regular lower body clothing?: None 6 Click Score: 24   End of Session Nurse Communication: Mobility status  Activity Tolerance: Patient tolerated treatment well Patient left: in bed;with call bell/phone within reach  OT Visit Diagnosis: Muscle weakness (generalized) (M62.81);Hemiplegia and hemiparesis Hemiplegia - Right/Left: Left Hemiplegia - dominant/non-dominant: Dominant Hemiplegia - caused by: Cerebral infarction                Time: 1610-9604 OT Time Calculation (min): 25 min Charges:  OT General Charges $OT Visit: 1 Visit OT Evaluation $OT Eval Moderate Complexity: 1 Mod OT Treatments $Self Care/Home Management : 8-22 mins  06/24/2021  RP, OTR/L  Acute Rehabilitation Services  Office:  [place]  06/24/2021, 10:34 AM

## 2021-06-24 NOTE — Significant Event (Signed)
Rapid Response Event Note   Reason for Call :  New  L hand weakness.  Pt here for stroke. MRI showed R sided infarcts.   Initial Focused Assessment:  Pt lying in bed with eyes open. She is alert and oriented, c/o L hand weakness and her mouth "feeling funny." She also says she feels her heart beating fast. NIH-0. Pt is unable to grip L hand but is able to lift L arm up, though weaker than her R arm.   T-98.6, HR-147, BP-147/74, RR-20, SpO2-100% on RA.  Pt very anxious. After a few minutes, HR decreased to 90s.   Interventions:  1L NS bolus over 2 hours.  CT head STAT-negative for acute changes Plan of Care:  Continue to monitor pt. Call RRT if further assistance needed.    Event Summary:   MD Notified: Dr. Leonel Ramsay @ [phone] Call [place], Elvira Langston Anderson, RN

## 2021-06-24 NOTE — Progress Notes (Signed)
SLP Cancellation Note  Patient Details Name: Angela Hahn MRN: [id number] DOB: 1976/06/19   Cancelled treatment:       Reason Eval/Treat Not Completed: Patient at procedure or test/unavailable (SLP attempted twice, but pt was off the unit. SLP will follow up on subsequent date.)  Marke Goodwyn I. Hardin Negus, [place], [place] Office number [phone] Pager [place] 06/24/2021, 3:35 PM

## 2021-06-24 NOTE — Progress Notes (Signed)
STROKE TEAM PROGRESS NOTE   SUBJECTIVE (INTERVAL HISTORY) She was seen at the vascular lab.  Patient this morning awake, found to have left arm and hand weakness.  Repeat CT showed no acute abnormality.  MRI repeat today showed clear-cut right insular cortex, right precentral gyrus, and the bilateral frontal punctate infarcts right more than left.  Patient has no right-sided symptoms at this moment.   OBJECTIVE Temp:  [98 F (36.7 C)-98.9 F (37.2 C)] 98.9 F (37.2 C) (10/28 1159) Pulse Rate:  [69-102] 78 (10/28 1159) Cardiac Rhythm: Normal sinus rhythm (10/28 0727) Resp:  [15-20] 18 (10/28 1159) BP: (130-152)/(71-97) 143/82 (10/28 1257) SpO2:  [99 %-100 %] 100 % (10/28 1159) Weight:  [89.8 kg] 89.8 kg (10/27 1558)  Recent Labs  Lab 06/24/21 0116  GLUCAP 122*   Recent Labs  Lab 06/23/21 0908 06/23/21 0928  NA 134* 138  K 3.8 3.8  CL 106 103  CO2 24  --   GLUCOSE 126* 125*  BUN 9 7  CREATININE 0.81 0.80  CALCIUM 9.0  --    Recent Labs  Lab 06/23/21 0908  AST 19  ALT 23  ALKPHOS 39  BILITOT 0.4  PROT 7.7  ALBUMIN 3.8   Recent Labs  Lab 06/23/21 0900 06/23/21 0928  WBC 4.9  --   NEUTROABS 2.8  --   HGB 12.4 13.6  HCT 39.7 40.0  MCV 79.4*  --   PLT 270  --    No results for input(s): CKTOTAL, CKMB, CKMBINDEX, TROPONINI in the last 168 hours. Recent Labs    06/23/21 0908  LABPROT 12.7  INR 1.0   Recent Labs    06/23/21 0934  COLORURINE YELLOW  LABSPEC 1.010  PHURINE 5.0  GLUCOSEU NEGATIVE  HGBUR NEGATIVE  BILIRUBINUR NEGATIVE  KETONESUR NEGATIVE  PROTEINUR NEGATIVE  NITRITE NEGATIVE  LEUKOCYTESUR NEGATIVE       Component Value Date/Time   CHOL 179 06/24/2021 0314   TRIG 115 06/24/2021 0314   HDL 61 06/24/2021 0314   CHOLHDL 2.9 06/24/2021 0314   VLDL 23 06/24/2021 0314   LDLCALC 95 06/24/2021 0314   Lab Results  Component Value Date   HGBA1C 6.3 (H) 06/24/2021      Component Value Date/Time   LABOPIA NONE DETECTED 06/23/2021 0934    COCAINSCRNUR NONE DETECTED 06/23/2021 0934   LABBENZ NONE DETECTED 06/23/2021 0934   AMPHETMU NONE DETECTED 06/23/2021 0934   THCU NONE DETECTED 06/23/2021 0934   LABBARB NONE DETECTED 06/23/2021 0934    Recent Labs  Lab 06/23/21 1920  ETH <10    I have personally reviewed the radiological images below and agree with the radiology interpretations.  CT Angio Head W or Wo Contrast  Result Date: 06/23/2021 CLINICAL DATA:  Neuro deficit, acute, stroke suspected; right arm weakness, acute stroke on MRI EXAM: CT ANGIOGRAPHY HEAD AND NECK TECHNIQUE: Multidetector CT imaging of the head and neck was performed using the standard protocol during bolus administration of intravenous contrast. Multiplanar CT image reconstructions and MIPs were obtained to evaluate the vascular anatomy. Carotid stenosis measurements (when applicable) are obtained utilizing NASCET criteria, using the distal internal carotid diameter as the denominator. CONTRAST:  58m OMNIPAQUE IOHEXOL 350 MG/ML SOLN COMPARISON:  Correlation made with recent MR imaging FINDINGS: CTA NECK Aortic arch: Great vessel origins are patent. Right carotid system: Patent.  No stenosis. Left carotid system: Patent.  No stenosis. Vertebral arteries: Patent. Left vertebral artery is dominant. No stenosis. Skeleton: Facet predominant cervical spine degenerative changes.  Other neck: Unremarkable. Upper chest: There is a 2 mm nodule of the right upper lobe (series 5, image 12). Review of the MIP images confirms the above findings CTA HEAD Anterior circulation: Intracranial internal carotid arteries are patent. Anterior cerebral arteries are patent. Left A1 ACA is dominant. Anterior communicating artery is present. Middle cerebral arteries are patent. Probable stenosis or occlusion of small mid right M2 MCA branch in the region of insular infarct on MRI. This corresponds to questioned abnormality on MRA. Posterior circulation: Intracranial vertebral arteries  are patent. Basilar artery is patent. Major cerebellar artery origins are patent. Bilateral posterior communicating arteries are present. Posterior cerebral arteries are patent. Venous sinuses: Patent as allowed by contrast bolus timing. Review of the MIP images confirms the above findings IMPRESSION: No large vessel occlusion. No hemodynamically significant stenosis in the neck. Probable stenosis or occlusion of a small mid right M2 MCA branch in the region of insular infarct on MRI. This may correspond to abnormality seen on MRA. 2 mm right upper lobe nodule. No follow-up needed if patient is low-risk. Non-contrast chest CT can be considered in 12 months if patient is high-risk. This recommendation follows the consensus statement: Guidelines for Management of Incidental Pulmonary Nodules Detected on CT Images: From the Fleischner Society 2017; Radiology 2017; 284:228-243. Electronically Signed   By: Macy Mis M.D.   On: 06/23/2021 13:33   DG Chest 2 View  Result Date: 06/23/2021 CLINICAL DATA:  Chest pain EXAM: CHEST - 2 VIEW COMPARISON:  Chest x-ray 04/25/2020 FINDINGS: Heart size and mediastinal contours are within normal limits. No suspicious pulmonary opacities identified. No pleural effusion or pneumothorax visualized. No acute osseous abnormality appreciated. IMPRESSION: No acute intrathoracic process identified. Electronically Signed   By: Ofilia Neas M.D.   On: 06/23/2021 12:16   CT HEAD WO CONTRAST (5MM)  Result Date: 06/24/2021 CLINICAL DATA:  Stroke follow-up. EXAM: CT HEAD WITHOUT CONTRAST TECHNIQUE: Contiguous axial images were obtained from the base of the skull through the vertex without intravenous contrast. COMPARISON:  Plain brain CT and MR head, dated June 23, 2021 FINDINGS: Brain: No evidence of acute infarction, hemorrhage, hydrocephalus, extra-axial collection or mass lesion/mass effect. Vascular: No hyperdense vessel or unexpected calcification. Skull: Normal.  Negative for fracture or focal lesion. Sinuses/Orbits: No acute finding. Other: None. IMPRESSION: 1. No acute intracranial pathology. 2. No CT abnormality to correspond to the acute infarct within the right corona radiata seen on the prior MR head. Correlation with follow-up MRI is recommended. Electronically Signed   By: Virgina Norfolk M.D.   On: 06/24/2021 01:58   CT Angio Neck W and/or Wo Contrast  Result Date: 06/23/2021 CLINICAL DATA:  Neuro deficit, acute, stroke suspected; right arm weakness, acute stroke on MRI EXAM: CT ANGIOGRAPHY HEAD AND NECK TECHNIQUE: Multidetector CT imaging of the head and neck was performed using the standard protocol during bolus administration of intravenous contrast. Multiplanar CT image reconstructions and MIPs were obtained to evaluate the vascular anatomy. Carotid stenosis measurements (when applicable) are obtained utilizing NASCET criteria, using the distal internal carotid diameter as the denominator. CONTRAST:  31m OMNIPAQUE IOHEXOL 350 MG/ML SOLN COMPARISON:  Correlation made with recent MR imaging FINDINGS: CTA NECK Aortic arch: Great vessel origins are patent. Right carotid system: Patent.  No stenosis. Left carotid system: Patent.  No stenosis. Vertebral arteries: Patent. Left vertebral artery is dominant. No stenosis. Skeleton: Facet predominant cervical spine degenerative changes. Other neck: Unremarkable. Upper chest: There is a 2 mm nodule of the  right upper lobe (series 5, image 12). Review of the MIP images confirms the above findings CTA HEAD Anterior circulation: Intracranial internal carotid arteries are patent. Anterior cerebral arteries are patent. Left A1 ACA is dominant. Anterior communicating artery is present. Middle cerebral arteries are patent. Probable stenosis or occlusion of small mid right M2 MCA branch in the region of insular infarct on MRI. This corresponds to questioned abnormality on MRA. Posterior circulation: Intracranial vertebral  arteries are patent. Basilar artery is patent. Major cerebellar artery origins are patent. Bilateral posterior communicating arteries are present. Posterior cerebral arteries are patent. Venous sinuses: Patent as allowed by contrast bolus timing. Review of the MIP images confirms the above findings IMPRESSION: No large vessel occlusion. No hemodynamically significant stenosis in the neck. Probable stenosis or occlusion of a small mid right M2 MCA branch in the region of insular infarct on MRI. This may correspond to abnormality seen on MRA. 2 mm right upper lobe nodule. No follow-up needed if patient is low-risk. Non-contrast chest CT can be considered in 12 months if patient is high-risk. This recommendation follows the consensus statement: Guidelines for Management of Incidental Pulmonary Nodules Detected on CT Images: From the Fleischner Society 2017; Radiology 2017; 284:228-243. Electronically Signed   By: Macy Mis M.D.   On: 06/23/2021 13:33   MR ANGIO HEAD WO CONTRAST  Result Date: 06/23/2021 CLINICAL DATA:  Stroke, follow-up. Additional history provided: Lightheadedness, right arm weakness, chest pain, headache. EXAM: MRI HEAD WITHOUT CONTRAST MRA HEAD WITHOUT CONTRAST TECHNIQUE: Multiplanar, multi-echo pulse sequences of the brain and surrounding structures were acquired without intravenous contrast. Angiographic images of the Circle of Willis were acquired using MRA technique without intravenous contrast. COMPARISON:  Head CT 06/23/2021. FINDINGS: MRI HEAD FINDINGS Brain: Mild intermittent motion degradation. Cerebral volume is normal. 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 Additional 2 mm acute cortical infarct within the high posterior right frontal lobe (series 9, image 99). A subtle punctate acute infarct is also questioned within the high posterior left frontal lobe (versus artifact) (for instance as seen on series 9, image 98)  (series 17, image 57). No evidence of an intracranial mass. No chronic intracranial blood products. No extra-axial fluid collection. No midline shift. Vascular: T2 FLAIR hyperintense signal abnormality within right M2 MCA vessels within the right sylvian fissure, which may reflect thrombus or slow flow within these vessels. Skull and upper cervical spine: Flow voids otherwise maintained within the proximal large arterial vessels. Sinuses/Orbits: Visualized orbits show no acute finding. No significant paranasal sinus disease. MRA HEAD FINDINGS Moderately motion degraded exam. Anterior circulation: The intracranial internal carotid arteries are patent. The M1 middle cerebral arteries are patent. 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 No left M2 proximal branch occlusion or severe proximal stenosis is identified. The anterior cerebral arteries are patent. 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 Within the limitations of motion degradation, no intracranial aneurysm is identified. Posterior circulation: The intracranial vertebral arteries are patent. Dominant left vertebral artery. The basilar artery is patent. The posterior cerebral arteries are patent. Fetal origin right posterior cerebral artery. A left posterior communicating artery is also present. Anatomic variants: As described. MRI brain impressions #2 and #3 and MRA head impression #2 called by telephone at the time of interpretation on 06/23/2021 at 12:36 pm to provider Dr. Armandina Gemma, who verbally acknowledged these results. IMPRESSION:  MRI brain: 1. Mildly motion degraded exam. 2. 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 3. Additional 2 mm acute cortical infarct within the high posterior right frontal lobe (also within the right MCA vascular territory). 4.  Subtle punctate acute infarct (versus artifact) within the high posterior left frontal lobe. MRA head: 1. Moderately motion degraded exam. 2. 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 3. 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 Electronically Signed   By: Kellie Simmering D.O.   On: 06/23/2021 12:45   MR ANGIO NECK WO CONTRAST  Result Date: 06/23/2021 CLINICAL DATA:  Stroke, follow-up. Additional history provided: Lightheadedness, right arm weakness, chest pain and headache. EXAM: MRA NECK WITHOUT CONTRAST TECHNIQUE: Angiographic images of the neck were acquired using MRA technique without intravenous contrast. Carotid stenosis measurements (when applicable) are obtained utilizing NASCET criteria, using the distal internal carotid diameter as the denominator. COMPARISON:  CT angiogram chest 09/28/2011. Concurrently performed MRI brain and MRA head 06/23/2021. FINDINGS: Motion degradation at the level of the lower neck/upper chest. Standard aortic branching. The common carotid and internal carotid arteries are patent within the neck without hemodynamically significant stenosis (50% or greater). The vertebral arteries are patent within the neck. The left vertebral artery is dominant. Motion degradation precludes evaluation for stenosis at the origin of the non-dominant right vertebral artery. No appreciable hemodynamically significant stenosis elsewhere within the cervical vertebral arteries. IMPRESSION: The common carotid and internal carotid arteries are patent within the neck without hemodynamically significant stenosis. The vertebral arteries are patent within the neck. Motion degradation precludes evaluation for stenosis at the origin of the non-dominant right vertebral artery. No appreciable hemodynamically significant stenosis elsewhere within the cervical vertebral arteries. Electronically Signed   By: Kellie Simmering D.O.    On: 06/23/2021 12:53   MR BRAIN WO CONTRAST  Result Date: 06/23/2021 CLINICAL DATA:  Stroke, follow-up. Additional history provided: Lightheadedness, right arm weakness, chest pain, headache. EXAM: MRI HEAD WITHOUT CONTRAST MRA HEAD WITHOUT CONTRAST TECHNIQUE: Multiplanar, multi-echo pulse sequences of the brain and surrounding structures were acquired without intravenous contrast. Angiographic images of the Circle of Willis were acquired using MRA technique without intravenous contrast. COMPARISON:  Head CT 06/23/2021. FINDINGS: MRI HEAD FINDINGS Brain: Mild intermittent motion degradation. Cerebral volume is normal. 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 Additional 2 mm acute cortical infarct within the high posterior right frontal lobe (series 9, image 99). A subtle punctate acute infarct is also questioned within the high posterior left frontal lobe (versus artifact) (for instance as seen on series 9, image 98) (series 17, image 57). No evidence of an intracranial mass. No chronic intracranial blood products. No extra-axial fluid collection. No midline shift. Vascular: T2 FLAIR hyperintense signal abnormality within right M2 MCA vessels within the right sylvian fissure, which may reflect thrombus or slow flow within these vessels. Skull and upper cervical spine: Flow voids otherwise maintained within the proximal large arterial vessels. Sinuses/Orbits: Visualized orbits show no acute finding. No significant paranasal sinus disease. MRA HEAD FINDINGS Moderately motion degraded exam. Anterior circulation: The intracranial internal carotid arteries are patent. The M1 middle cerebral arteries are patent. 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 No left M2 proximal branch occlusion or severe proximal stenosis is identified. The anterior cerebral arteries are  patent. 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 Within the limitations of motion degradation, no intracranial aneurysm is identified. Posterior circulation: The intracranial vertebral arteries are patent. Dominant left vertebral artery. The basilar artery is patent. The posterior cerebral arteries are patent. Fetal origin right posterior cerebral artery. A left posterior communicating artery is also present. Anatomic variants: As described. MRI brain impressions #2 and #3 and MRA head impression #2 called by telephone at the time of interpretation on 06/23/2021 at 12:36 pm to provider Dr. Armandina Gemma, who verbally acknowledged these results. IMPRESSION: MRI brain: 1. Mildly motion degraded exam. 2. 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 3. Additional 2 mm acute cortical infarct within the high posterior right frontal lobe (also within the right MCA vascular territory). 4. Subtle punctate acute infarct (versus artifact) within the high posterior left frontal lobe. MRA head: 1. Moderately motion degraded exam. 2. 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 3. 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 Electronically Signed   By: Kellie Simmering D.O.   On: 06/23/2021 12:45   MR BRAIN W WO CONTRAST  Result Date: 06/24/2021 CLINICAL DATA:  Stroke, follow-up. EXAM: MRI HEAD WITHOUT AND WITH CONTRAST TECHNIQUE: Multiplanar, multiecho pulse sequences of the brain and surrounding structures were obtained without and with intravenous contrast. CONTRAST:  8.102m GADAVIST GADOBUTROL 1 MMOL/ML IV SOLN COMPARISON:  CT head without contrast 06/24/2021. MR head without contrast 06/23/2021 FINDINGS: Brain: The acute infarct involving the right insular cortex and external capsule is stable. Additional restricted diffusion is now noted along the  precentral gyrus on the right. This may correspond to additional left-sided weakness. The punctate focus in the right middle frontal gyrus is again seen. Punctate focus in the left superior frontal gyrus continues to fade. Additional punctate foci are noted in the high anterior right parietal lobe. T2 signal changes are associated with the areas of acute infarction. No acute hemorrhage or mass lesion is present. Minimal white matter changes are present otherwise. The internal auditory canals are within normal limits. The brainstem and cerebellum are within normal limits. The ventricles are of normal size. No significant extraaxial fluid collection is present. Vascular: Flow is present in the major intracranial arteries. Skull and upper cervical spine: The craniocervical junction is normal. Upper cervical spine is within normal limits. Marrow signal is unremarkable. Sinuses/Orbits: The paranasal sinuses and mastoid air cells are clear. The globes and orbits are within normal limits. IMPRESSION: 1. Stable appearance of acute/subacute nonhemorrhagic infarct involving the right insular cortex and external capsule. Stable appearance of acute/subacute nonhemorrhagic infarcts in the anterior frontal lobes bilaterally. 2. Additional punctate foci of acute nonhemorrhagic infarction involving the right precentral gyrus and right parietal lobe. Question ongoing embolic source of these infarctions. Electronically Signed   By: CSan MorelleM.D.   On: 06/24/2021 12:52   VAS [place] DOPPLER W BUBBLES  Result Date: 06/24/2021  Transcranial Doppler with Bubble Patient Name:  Angela Hahn Date of Exam:   06/24/2021 Medical Rec #: [id number]     Accession #:    [id number] of Birth: 109-09-1975     Patient Gender: F Patient Age:   89years [facility]:      VAS [place]ANS[place]IAL DOPPLER W BUBBLES Referring Phys: JCornelius MorasXU  --------------------------------------------------------------------------------  Indications: Stroke. Performing Technologist: MArchie PattenRVS  Examination  Guidelines: A complete evaluation includes B-mode imaging, spectral Doppler, color Doppler, and power Doppler as needed of all accessible portions of each vessel. Bilateral testing is considered an integral part of a complete examination. Limited examinations for reoccurring indications may be performed as noted.  Summary: No HITS at rest or during Valsalva. Negative transcranial Doppler Bubble study with no evidence of right to left intracardiac communication.  A vascular evaluation was performed. The right middle cerebral artery was studied. An IV was inserted into the patient's left forearm. Verbal informed consent was obtained.  *See table(s) above for TCD measurements and observations.    Preliminary    CT HEAD CODE STROKE WO CONTRAST  Result Date: 06/23/2021 CLINICAL DATA:  Code stroke. Near syncope yesterday. Right arm numbness today. EXAM: CT HEAD WITHOUT CONTRAST TECHNIQUE: Contiguous axial images were obtained from the base of the skull through the vertex without intravenous contrast. COMPARISON:  04/25/2020 FINDINGS: Brain: No evidence of acute infarction, hemorrhage, hydrocephalus, extra-axial collection or mass lesion/mass effect. Vascular: No hyperdense vessel or unexpected calcification. Skull: Normal. Negative for fracture or focal lesion. Sinuses/Orbits: No acute finding. Other: These results were communicated to Dr Quinn Axe at 9:34 am on 06/23/2021 by text page via the [facility] messaging system. Motion artifact words the vertex. ASPECTS [facility] Stroke Program Early CT Score) - Ganglionic level infarction (caudate, lentiform nuclei, internal capsule, insula, M1-M3 cortex): 7 - Supraganglionic infarction (M4-M6 cortex): 3 Total score (0-10 with 10 being normal): 10 IMPRESSION: No acute finding. Electronically Signed   By: Jorje Guild M.D.    On: 06/23/2021 09:35   VAS [place] LOWER EXTREMITY VENOUS (DVT)  Result Date: 06/24/2021  Lower Venous DVT Study Patient Name:  Angela Hahn  Date of Exam:   06/24/2021 Medical Rec #: [id number]      Accession #:    [id number] Date of Birth: 04-03-1976      Patient Gender: F Patient Age:   36 years Exam Location:  [facility] Procedure:      VAS [place] LOWER EXTREMITY VENOUS (DVT) Referring Phys: Cornelius Moras XU --------------------------------------------------------------------------------  Indications: Stroke.  Comparison Study: no prior Performing Technologist: Archie Patten RVS  Examination Guidelines: A complete evaluation includes B-mode imaging, spectral Doppler, color Doppler, and power Doppler as needed of all accessible portions of each vessel. Bilateral testing is considered an integral part of a complete examination. Limited examinations for reoccurring indications may be performed as noted. The reflux portion of the exam is performed with the patient in reverse Trendelenburg.  +---------+---------------+---------+-----------+----------+--------------+ RIGHT    CompressibilityPhasicitySpontaneityPropertiesThrombus Aging +---------+---------------+---------+-----------+----------+--------------+ CFV      Full           Yes      Yes                                 +---------+---------------+---------+-----------+----------+--------------+ SFJ      Full                                                        +---------+---------------+---------+-----------+----------+--------------+ FV Prox  Full                                                        +---------+---------------+---------+-----------+----------+--------------+  FV Mid   Full                                                        +---------+---------------+---------+-----------+----------+--------------+ FV DistalFull                                                         +---------+---------------+---------+-----------+----------+--------------+ PFV      Full                                                        +---------+---------------+---------+-----------+----------+--------------+ POP      Full           Yes      Yes                                 +---------+---------------+---------+-----------+----------+--------------+ PTV      Full                                                        +---------+---------------+---------+-----------+----------+--------------+ PERO     Full                                                        +---------+---------------+---------+-----------+----------+--------------+   +---------+---------------+---------+-----------+----------+--------------+ LEFT     CompressibilityPhasicitySpontaneityPropertiesThrombus Aging +---------+---------------+---------+-----------+----------+--------------+ CFV      Full           Yes      Yes                                 +---------+---------------+---------+-----------+----------+--------------+ SFJ      Full                                                        +---------+---------------+---------+-----------+----------+--------------+ FV Prox  Full                                                        +---------+---------------+---------+-----------+----------+--------------+ FV Mid   Full                                                        +---------+---------------+---------+-----------+----------+--------------+  FV DistalFull                                                        +---------+---------------+---------+-----------+----------+--------------+ PFV      Full                                                        +---------+---------------+---------+-----------+----------+--------------+ POP      Full           Yes      Yes                                  +---------+---------------+---------+-----------+----------+--------------+ PTV      Full                                                        +---------+---------------+---------+-----------+----------+--------------+ PERO     Full                                                        +---------+---------------+---------+-----------+----------+--------------+    Summary: BILATERAL: - No evidence of deep vein thrombosis seen in the lower extremities, bilaterally. -No evidence of popliteal cyst, bilaterally.   *See table(s) above for measurements and observations.    Preliminary      PHYSICAL EXAM  Temp:  [98 F (36.7 C)-98.9 F (37.2 C)] 98.9 F (37.2 C) (10/28 1159) Pulse Rate:  [69-102] 78 (10/28 1159) Resp:  [15-20] 18 (10/28 1159) BP: (130-152)/(71-97) 143/82 (10/28 1257) SpO2:  [99 %-100 %] 100 % (10/28 1159) Weight:  [89.8 kg] 89.8 kg (10/27 1558)  General - Well nourished, well developed, in no apparent distress.  Ophthalmologic - fundi not visualized due to noncooperation.  Cardiovascular - Regular rhythm and rate.  Mental Status -  Level of arousal and orientation to time, place, and person were intact. Language including expression, naming, repetition, comprehension was assessed and found intact. Fund of Knowledge was assessed and was intact.  Cranial Nerves II - XII - II - Visual field intact OU. III, IV, VI - Extraocular movements intact. V - Facial sensation intact bilaterally. VII - Facial movement intact bilaterally. VIII - Hearing & vestibular intact bilaterally. X - Palate elevates symmetrically. XI - Chin turning & shoulder shrug intact bilaterally. XII - Tongue protrusion intact.  Motor Strength - The patient's strength was normal in all extremities and except left hand decreased grip and hand dexterity.  Bulk was normal and fasciculations were absent.   Motor Tone - Muscle tone was assessed at the neck and appendages and was  normal.  Reflexes - The patient's reflexes were symmetrical in all extremities and she had no pathological reflexes.  Sensory - Light touch, temperature/pinprick were assessed and were symmetrical.    Coordination -  The patient had normal movements in the hands with no ataxia or dysmetria.  Tremor was absent.  Gait and Station - deferred.   ASSESSMENT/PLAN [name] is a 45 y.o. female with history of anemia, migraine and on birth control pills admitted for transient right-sided weakness, and later left hand weakness. No tPA given due to NIHSS = 0 in ED.    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  Single left frontal tiny infarct. 2D Echo pending LE venous Doppler negative for DVT TCD bubble study negative for PFO Will consider TEE if all work up neg LDL 95 HgbA1c 6.3 TSH/B12/FT4/ESR/CRP/HIV/UDS all negative Hypercoagulable work-up pending SCDs for VTE prophylaxis No antithrombotic prior to admission, now on aspirin 81 mg daily and clopidogrel 75 mg daily.  Patient counseled to be compliant with her antithrombotic medications Ongoing aggressive stroke risk factor management Therapy recommendations:  outpt PT Disposition:  pending  Hypertension Stable Long term BP goal normotensive  Hyperlipidemia Home meds:  none  LDL 95, goal < 70 Now on Lipitor 40 Continue statin at discharge  Birth control pill use On Tri-Sprintec for the last 8 months Negative for DVT Has quit smoking for a year Recommend discussed with OB after discharge to avoid estrogen use.  May consider progesterone alone birth control pills if  needed.  Other Stroke Risk Factors Former cigarette smoker, quit 1 year ago Obesity, Body mass index is 31.96 kg/m.  Migraines  Other Active Problems MRI contrast allergy - mild with cough, facial swelling, itchy - received benadryl 57m IV  Hospital day # 1  Patient has developed left hand weakness overnight, and MRI confirmed embolic strokes. I spent  35 minutes in total face-to-face time with the patient, more than 50% of which was spent in counseling and coordination of care, reviewing test results, images and medication, and discussing the diagnosis, treatment plan and potential prognosis. This patient's care requiresreview of multiple databases, neurological assessment, discussion with family, other specialists and medical decision making of high complexity.    JRosalin Hawking MD PhD Stroke Neurology 06/24/2021 3:43 PM    To contact Stroke Continuity provider, please refer to [email or web address] After hours, contact General Neurology

## 2021-06-24 NOTE — Progress Notes (Signed)
Pt woke up c/o of numbness and weakness to her left arm/hand Nu[place]cks done pt cannot squeeze or use her right arm. Rapid Response was contacted. Pt was assessed by rapid response. MD was contacted, New orders noted. See mar for details.

## 2021-06-24 NOTE — Progress Notes (Signed)
Called by nursing regarding new left hand weakness. She is VAN negative and not a tnk cnadidate due to recent infarct. I suspect slight extension of her previous stroke. Will give fluid bolus x 1 and repeat CT.  Roland Rack, MD [facility] [phone]  If 7pm- 7am, please page neurology on call as listed in [place].

## 2021-06-24 NOTE — Progress Notes (Signed)
PROGRESS NOTE    Angela Hahn  CXK:[id number] DOB: July 14, 1976 DOA: 06/23/2021 PCP: Laurey Morale, MD   Chief Complaint  Patient presents with   Extremity Weakness    Brief Narrative/Hospital Course:  Angela Hahn, 45 y.o. female with PMH of IDA, obesity presents with lightheadedness, right arm weakness and chest pain and headache was still on ED. As per the reports she had episode of lightheadedness feeling about to faint while getting out of the restaurant, no arm or leg weakness, went home and overnight did okay got up at 7 AM however when she was standing she felt lightheaded, chest pain with heart palpitation and also right arm weakness, heaviness not able to turn the doorknob.  She was seen in the ED had left frontal headache throbbing pain with nausea, blood pressure 110/70. 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 Patient was transferred to [facility] and admitted. 10/28 early morning had similar weakness on the left hand underwent repeat CT head-no acute finding   Subjective: Seen examined this morning.  Complains of left weak grip.  She is going for MRI today. No new complaints  Assessment & Plan:  Weakness right side and left side/ACUTE STROKE: Suspecting embolic stroke: Presented with right-sided weakness but again 10/20 8 AM had left-sided weakness.  Initial MRI brain 10/27 showed acute infarct in the right corona, right insula cortical infarct on the high posterior right frontal lobe, subtle punctate acute infarct versus artifact in the left posterior frontal-MRI with and without contrast  this morning- stable acute/subacute nonhemorrhagic infarct involving the right insular cortex and external capsule. Stable appearance of acute/subacute nonhemorrhagic infarcts in the anterior frontal lobes bilaterally. 2. Additional punctate foci of acute nonhemorrhagic infarction involving the right precentral gyrus and right parietal lobe" ?  Ongoing embolic source, await for further recommendation by neurology hypercoagulable work-up in process.  On aspirin and Plavix. Monitor neuro check frequently, LDL 95 target less than 70, HbA1c 6.3-indicating prediabetes.  Added statin.ptot eval. follow-up echocardiogram, MRA head/neck - head occlusion versus severe near occlusive stenosis of proximal to mid right M2 MCA vessel.  Prediabetes-dietary counseling Class I Obesity:Patient's Body mass index is 31.96 kg/m. : Will benefit with PCP follow-up, weight loss  healthy lifestyle and outpatient sleep evaluation.  DVT prophylaxis: SCD's Start: 06/23/21 2019 Code Status:   Code Status: Full Code Family Communication: plan of care discussed with patient at bedside. Status is: Inpatient Remains inpatient appropriate because: For ongoing management of acute stroke  Objective: Vitals last 24 hrs: Vitals:   06/24/21 0132 06/24/21 0741 06/24/21 1159 06/24/21 1257  BP: (!) 147/74 131/71 (!) 147/82 (!) 143/82  Pulse: (!) 102 78 78   Resp: 20 16 18    Temp: 98.6 F (37 C) 98.8 F (37.1 C) 98.9 F (37.2 C)   TempSrc: Oral Oral Oral   SpO2: 100% 99% 100%   Weight:      Height:       Weight change:   Intake/Output Summary (Last 24 hours) at 06/24/2021 1324 Last data filed at 06/24/2021 0329 Gross per 24 hour  Intake 1319.41 ml  Output --  Net 1319.41 ml   Net IO  Since Admission: 1,319.41 mL [06/24/21 1324]   Physical Examination: General exam: AA0x3, weak,older than stated age. HEENT:Oral mucosa moist, Ear/Nose WNL grossly,dentition normal. Respiratory system: B/l clear BS, no use of accessory  muscle, non tender. Cardiovascular system: S1 & S2 +,No JVD. Gastrointestinal system: Abdomen soft, NT,ND, BS+. Nervous System:Alert, awake, moving extremities well with mild weakness in the left upper extremity and very weak left grip. Extremities: edema none, distal peripheral pulses palpable.  Skin: No rashes, no icterus. MSK: Normal muscle bulk, tone, power.  Medications reviewed:  Scheduled Meds:  aspirin EC  81 mg Oral Daily   clopidogrel  75 mg Oral Daily   Continuous Infusions:  sodium chloride 75 mL/hr at 06/23/21 2112   Diet Order             Diet clear liquid Room service appropriate? Yes; Fluid consistency: Thin  Diet effective now                  Weight change:   Wt Readings from Last 3 Encounters:  06/23/21 89.8 kg  05/20/21 89.8 kg  10/27/20 92.5 kg     Consultants:see note  Procedures:see note Antimicrobials: Anti-infectives (From admission, onward)    None      Culture/Microbiology    Component Value Date/Time   SDES THROAT 01/26/2013 1514   SPECREQUEST NONE 01/26/2013 1514   CULT No Beta Hemolytic Streptococci Isolated 01/26/2013 1514   REPTSTATUS 01/28/2013 FINAL 01/26/2013 1514    Other culture-see note  Unresulted Labs (From admission, onward)     Start     Ordered   06/24/21 1228  Phosphatidylserine antibodies  Once,   R       Question:  Specimen collection method  Answer:  Unit=Unit collect   06/24/21 1228   06/24/21 1228  Antithrombin III  (Hypercoagulable Panel, Comprehensive (PNL))  Once,   R       Question:  Specimen collection method  Answer:  Unit=Unit collect   06/24/21 1228   06/24/21 1228  Protein C activity  (Hypercoagulable Panel, Comprehensive (PNL))  Once,   R       Question:  Specimen collection method  Answer:  Unit=Unit collect   06/24/21 1228   06/24/21 1228  Protein C, total  (Hypercoagulable Panel, Comprehensive (PNL))  Once,   R       Question:  Specimen collection method  Answer:  Unit=Unit collect    06/24/21 1228   06/24/21 1228  Protein S activity  (Hypercoagulable Panel, Comprehensive (PNL))  Once,   R       Question:  Specimen collection method  Answer:  Unit=Unit collect   06/24/21 1228   06/24/21 1228  Protein S, total  (Hypercoagulable Panel, Comprehensive (PNL))  Once,   R       Question:  Specimen collection method  Answer:  Unit=Unit collect   06/24/21 1228   06/24/21 1228  Lupus anticoagulant panel  (Hypercoagulable Panel, Comprehensive (PNL))  Once,   R       Question:  Specimen collection method  Answer:  Unit=Unit collect   06/24/21 1228   06/24/21 1228  Beta-2-glycoprotein i abs, IgG/M/A  (Hypercoagulable Panel, Comprehensive (PNL))  Once,   R       Question:  Specimen collection method  Answer:  Unit=Unit collect   06/24/21 1228   06/24/21 1228  Homocysteine, serum  (Hypercoagulable Panel, Comprehensive (PNL))  Once,   R       Question:  Specimen collection method  Answer:  Unit=Unit collect   06/24/21 1228   06/24/21 1228  Factor 5 leiden  (Hypercoagulable Panel, Comprehensive (PNL))  Once,   R       Question:  Specimen collection method  Answer:  Unit=Unit collect   06/24/21 1228   06/24/21 1228  Prothrombin gene mutation  (Hypercoagulable Panel, Comprehensive (PNL))  Once,   R       Question:  Specimen collection method  Answer:  Unit=Unit collect   06/24/21 1228   06/24/21 1228  Cardiolipin antibodies, IgG, IgM, IgA  (Hypercoagulable Panel, Comprehensive (PNL))  Once,   R       Question:  Specimen collection method  Answer:  Unit=Unit collect   06/24/21 1228   06/24/21 1228  ANA, IFA (with reflex)  Once,   R       Question:  Specimen collection method  Answer:  Unit=Unit collect   06/24/21 1228   06/24/21 1228  Sedimentation rate  Once,   R       Question:  Specimen collection method  Answer:  Unit=Unit collect   06/24/21 1228   06/24/21 1228  C-reactive protein  Once,   R       Question:  Specimen collection method  Answer:  Unit=Unit collect   06/24/21 1228    06/24/21 1228  TSH  Once,   R       Question:  Specimen collection method  Answer:  Unit=Unit collect   06/24/21 1228   06/24/21 1228  T4, free  Once,   R       Question:  Specimen collection method  Answer:  Unit=Unit collect   06/24/21 1228   06/24/21 1228  Vitamin B12  Once,   R       Question:  Specimen collection method  Answer:  Unit=Unit collect   06/24/21 1228   06/24/21 1228  RPR  Once,   R       Question:  Specimen collection method  Answer:  Unit=Unit collect   06/24/21 1228           Data Reviewed: I have personally reviewed following labs and imaging studies CBC: Recent Labs  Lab 06/23/21 0900 06/23/21 0928  WBC 4.9  --   NEUTROABS 2.8  --   HGB 12.4 13.6  HCT 39.7 40.0  MCV 79.4*  --   PLT 270  --    Basic Metabolic Panel: Recent Labs  Lab 06/23/21 0908 06/23/21 0928  NA 134* 138  K 3.8 3.8  CL 106 103  CO2 24  --   GLUCOSE 126* 125*  BUN 9 7  CREATININE 0.81 0.80  CALCIUM 9.0  --    GFR: Estimated Creatinine Clearance: 98.1 mL/min (by C-G formula based on SCr of 0.8 mg/dL). Liver Function Tests: Recent Labs  Lab 06/23/21 0908  AST 19  ALT 23  ALKPHOS 39  BILITOT 0.4  PROT 7.7  ALBUMIN 3.8   No results for input(s): LIPASE, AMYLASE in the last 168 hours. No results for input(s): AMMONIA in the last 168 hours. Coagulation Profile: Recent Labs  Lab 06/23/21 0908  INR 1.0   Cardiac Enzymes: No results for input(s): CKTOTAL, CKMB, CKMBINDEX, TROPONINI in the last 168 hours. BNP (last 3 results) No results for input(s): PROBNP in the last 8760 hours. HbA1C: Recent Labs    06/24/21 0314  HGBA1C 6.3*   CBG: Recent Labs  Lab 06/24/21 0116  GLUCAP 122*   Lipid Profile: Recent Labs  06/24/21 0314  CHOL 179  HDL 61  LDLCALC 95  TRIG 115  CHOLHDL 2.9   Thyroid Function Tests: No results for input(s): TSH, T4TOTAL, FREET4, T3FREE, THYROIDAB in the last 72 hours. Anemia Panel: No results for input(s): VITAMINB12,  FOLATE, FERRITIN, TIBC, IRON, RETICCTPCT in the last 72 hours. Sepsis Labs: No results for input(s): PROCALCITON, LATICACIDVEN in the last 168 hours.  Recent Results (from the past 240 hour(s))  Resp Panel by RT-PCR (Flu A&B, Covid) Nasopharyngeal Swab     Status: None   Collection Time: 06/23/21  9:34 AM   Specimen: Nasopharyngeal Swab; Nasopharyngeal(NP) swabs in vial transport medium  Result Value Ref Range Status   SARS Coronavirus 2 by RT PCR NEGATIVE NEGATIVE Final    Comment: (NOTE) SARS-CoV-2 target nucleic acids are NOT DETECTED.  The SARS-CoV-2 RNA is generally detectable in upper respiratory specimens during the acute phase of infection. The lowest concentration of SARS-CoV-2 viral copies this assay can detect is 138 copies/mL. A negative result does not preclude SARS-Cov-2 infection and should not be used as the sole basis for treatment or other patient management decisions. A negative result may occur with  improper specimen collection/handling, submission of specimen other than nasopharyngeal swab, presence of viral mutation(s) within the areas targeted by this assay, and inadequate number of viral copies(<138 copies/mL). A negative result must be combined with clinical observations, patient history, and epidemiological information. The expected result is Negative.  Fact Sheet for Patients:  EntrepreneurPulse.com.au  Fact Sheet for Healthcare Providers:  IncredibleEmployment.be  This test is no t yet approved or cleared by the [place] FDA and  has been authorized for detection and/or diagnosis of SARS-CoV-2 by FDA under an Emergency Use Authorization (EUA). This EUA will remain  in effect (meaning this test can be used) for the duration of the COVID-19 declaration under Section 564(b)(1) of the Act, 21 U.S.C.section 360bbb-3(b)(1), unless the authorization is terminated  or revoked sooner.       Influenza A by PCR  NEGATIVE NEGATIVE Final   Influenza B by PCR NEGATIVE NEGATIVE Final    Comment: (NOTE) The Xpert Xpress SARS-CoV-2/FLU/RSV plus assay is intended as an aid in the diagnosis of influenza from Nasopharyngeal swab specimens and should not be used as a sole basis for treatment. Nasal washings and aspirates are unacceptable for Xpert Xpress SARS-CoV-2/FLU/RSV testing.  Fact Sheet for Patients: EntrepreneurPulse.com.au  Fact Sheet for Healthcare Providers: IncredibleEmployment.be  This test is not yet approved or cleared by the [place] FDA and has been authorized for detection and/or diagnosis of SARS-CoV-2 by FDA under an Emergency Use Authorization (EUA). This EUA will remain in effect (meaning this test can be used) for the duration of the COVID-19 declaration under Section 564(b)(1) of the Act, 21 U.S.C. section 360bbb-3(b)(1), unless the authorization is terminated or revoked.  Performed at [facility], [place] [address] [place], [place] [address]      Radiology Studies: CT Angio Head W or Wo Contrast  Result Date: 06/23/2021 CLINICAL DATA:  Neuro deficit, acute, stroke suspected; right arm weakness, acute stroke on MRI EXAM: CT ANGIOGRAPHY HEAD AND NECK TECHNIQUE: Multidetector CT imaging of the head and neck was performed using the standard protocol during bolus administration of intravenous contrast. Multiplanar CT image reconstructions and MIPs were obtained to evaluate the vascular anatomy. Carotid stenosis measurements (when applicable) are obtained utilizing NASCET criteria, using the distal internal carotid diameter as the denominator. CONTRAST:  65mL OMNIPAQUE IOHEXOL 350 MG/ML SOLN  COMPARISON:  Correlation made with recent MR imaging FINDINGS: CTA NECK Aortic arch: Great vessel origins are patent. Right carotid system: Patent.  No stenosis. Left carotid system: Patent.  No stenosis. Vertebral arteries: Patent. Left  vertebral artery is dominant. No stenosis. Skeleton: Facet predominant cervical spine degenerative changes. Other neck: Unremarkable. Upper chest: There is a 2 mm nodule of the right upper lobe (series 5, image 12). Review of the MIP images confirms the above findings CTA HEAD Anterior circulation: Intracranial internal carotid arteries are patent. Anterior cerebral arteries are patent. Left A1 ACA is dominant. Anterior communicating artery is present. Middle cerebral arteries are patent. Probable stenosis or occlusion of small mid right M2 MCA branch in the region of insular infarct on MRI. This corresponds to questioned abnormality on MRA. Posterior circulation: Intracranial vertebral arteries are patent. Basilar artery is patent. Major cerebellar artery origins are patent. Bilateral posterior communicating arteries are present. Posterior cerebral arteries are patent. Venous sinuses: Patent as allowed by contrast bolus timing. Review of the MIP images confirms the above findings IMPRESSION: No large vessel occlusion. No hemodynamically significant stenosis in the neck. Probable stenosis or occlusion of a small mid right M2 MCA branch in the region of insular infarct on MRI. This may correspond to abnormality seen on MRA. 2 mm right upper lobe nodule. No follow-up needed if patient is low-risk. Non-contrast chest CT can be considered in 12 months if patient is high-risk. This recommendation follows the consensus statement: Guidelines for Management of Incidental Pulmonary Nodules Detected on CT Images: From the Fleischner Society 2017; Radiology 2017; 284:228-243. Electronically Signed   By: Macy Mis M.D.   On: 06/23/2021 13:33   DG Chest 2 View  Result Date: 06/23/2021 CLINICAL DATA:  Chest pain EXAM: CHEST - 2 VIEW COMPARISON:  Chest x-ray 04/25/2020 FINDINGS: Heart size and mediastinal contours are within normal limits. No suspicious pulmonary opacities identified. No pleural effusion or pneumothorax  visualized. No acute osseous abnormality appreciated. IMPRESSION: No acute intrathoracic process identified. Electronically Signed   By: Ofilia Neas M.D.   On: 06/23/2021 12:16   CT HEAD WO CONTRAST (5MM)  Result Date: 06/24/2021 CLINICAL DATA:  Stroke follow-up. EXAM: CT HEAD WITHOUT CONTRAST TECHNIQUE: Contiguous axial images were obtained from the base of the skull through the vertex without intravenous contrast. COMPARISON:  Plain brain CT and MR head, dated June 23, 2021 FINDINGS: Brain: No evidence of acute infarction, hemorrhage, hydrocephalus, extra-axial collection or mass lesion/mass effect. Vascular: No hyperdense vessel or unexpected calcification. Skull: Normal. Negative for fracture or focal lesion. Sinuses/Orbits: No acute finding. Other: None. IMPRESSION: 1. No acute intracranial pathology. 2. No CT abnormality to correspond to the acute infarct within the right corona radiata seen on the prior MR head. Correlation with follow-up MRI is recommended. Electronically Signed   By: Virgina Norfolk M.D.   On: 06/24/2021 01:58   CT Angio Neck W and/or Wo Contrast  Result Date: 06/23/2021 CLINICAL DATA:  Neuro deficit, acute, stroke suspected; right arm weakness, acute stroke on MRI EXAM: CT ANGIOGRAPHY HEAD AND NECK TECHNIQUE: Multidetector CT imaging of the head and neck was performed using the standard protocol during bolus administration of intravenous contrast. Multiplanar CT image reconstructions and MIPs were obtained to evaluate the vascular anatomy. Carotid stenosis measurements (when applicable) are obtained utilizing NASCET criteria, using the distal internal carotid diameter as the denominator. CONTRAST:  62mL OMNIPAQUE IOHEXOL 350 MG/ML SOLN COMPARISON:  Correlation made with recent MR imaging FINDINGS: CTA NECK Aortic arch:  Great vessel origins are patent. Right carotid system: Patent.  No stenosis. Left carotid system: Patent.  No stenosis. Vertebral arteries: Patent. Left  vertebral artery is dominant. No stenosis. Skeleton: Facet predominant cervical spine degenerative changes. Other neck: Unremarkable. Upper chest: There is a 2 mm nodule of the right upper lobe (series 5, image 12). Review of the MIP images confirms the above findings CTA HEAD Anterior circulation: Intracranial internal carotid arteries are patent. Anterior cerebral arteries are patent. Left A1 ACA is dominant. Anterior communicating artery is present. Middle cerebral arteries are patent. Probable stenosis or occlusion of small mid right M2 MCA branch in the region of insular infarct on MRI. This corresponds to questioned abnormality on MRA. Posterior circulation: Intracranial vertebral arteries are patent. Basilar artery is patent. Major cerebellar artery origins are patent. Bilateral posterior communicating arteries are present. Posterior cerebral arteries are patent. Venous sinuses: Patent as allowed by contrast bolus timing. Review of the MIP images confirms the above findings IMPRESSION: No large vessel occlusion. No hemodynamically significant stenosis in the neck. Probable stenosis or occlusion of a small mid right M2 MCA branch in the region of insular infarct on MRI. This may correspond to abnormality seen on MRA. 2 mm right upper lobe nodule. No follow-up needed if patient is low-risk. Non-contrast chest CT can be considered in 12 months if patient is high-risk. This recommendation follows the consensus statement: Guidelines for Management of Incidental Pulmonary Nodules Detected on CT Images: From the Fleischner Society 2017; Radiology 2017; 284:228-243. Electronically Signed   By: Macy Mis M.D.   On: 06/23/2021 13:33   MR ANGIO HEAD WO CONTRAST  Result Date: 06/23/2021 CLINICAL DATA:  Stroke, follow-up. Additional history provided: Lightheadedness, right arm weakness, chest pain, headache. EXAM: MRI HEAD WITHOUT CONTRAST MRA HEAD WITHOUT CONTRAST TECHNIQUE: Multiplanar, multi-echo pulse  sequences of the brain and surrounding structures were acquired without intravenous contrast. Angiographic images of the Circle of Willis were acquired using MRA technique without intravenous contrast. COMPARISON:  Head CT 06/23/2021. FINDINGS: MRI HEAD FINDINGS Brain: Mild intermittent motion degradation. Cerebral volume is normal. 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 Additional 2 mm acute cortical infarct within the high posterior right frontal lobe (series 9, image 99). A subtle punctate acute infarct is also questioned within the high posterior left frontal lobe (versus artifact) (for instance as seen on series 9, image 98) (series 17, image 57). No evidence of an intracranial mass. No chronic intracranial blood products. No extra-axial fluid collection. No midline shift. Vascular: T2 FLAIR hyperintense signal abnormality within right M2 MCA vessels within the right sylvian fissure, which may reflect thrombus or slow flow within these vessels. Skull and upper cervical spine: Flow voids otherwise maintained within the proximal large arterial vessels. Sinuses/Orbits: Visualized orbits show no acute finding. No significant paranasal sinus disease. MRA HEAD FINDINGS Moderately motion degraded exam. Anterior circulation: The intracranial internal carotid arteries are patent. The M1 middle cerebral arteries are patent. 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 No left M2 proximal branch occlusion or severe proximal stenosis is identified. The anterior cerebral arteries are patent. 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 Within the limitations of motion degradation, no intracranial aneurysm is identified. Posterior circulation: The intracranial vertebral arteries are patent. Dominant left vertebral artery. The  basilar artery is patent. The posterior cerebral arteries are patent. Fetal origin  right posterior cerebral artery. A left posterior communicating artery is also present. Anatomic variants: As described. MRI brain impressions #2 and #3 and MRA head impression #2 called by telephone at the time of interpretation on 06/23/2021 at 12:36 pm to provider Dr. Armandina Gemma, who verbally acknowledged these results. IMPRESSION: MRI brain: 1. Mildly motion degraded exam. 2. 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 3. Additional 2 mm acute cortical infarct within the high posterior right frontal lobe (also within the right MCA vascular territory). 4. Subtle punctate acute infarct (versus artifact) within the high posterior left frontal lobe. MRA head: 1. Moderately motion degraded exam. 2. 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 3. 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 Electronically Signed   By: Kellie Simmering D.O.   On: 06/23/2021 12:45   MR ANGIO NECK WO CONTRAST  Result Date: 06/23/2021 CLINICAL DATA:  Stroke, follow-up. Additional history provided: Lightheadedness, right arm weakness, chest pain and headache. EXAM: MRA NECK WITHOUT CONTRAST TECHNIQUE: Angiographic images of the neck were acquired using MRA technique without intravenous contrast. Carotid stenosis measurements (when applicable) are obtained utilizing NASCET criteria, using the distal internal carotid diameter as the denominator. COMPARISON:  CT angiogram chest 09/28/2011. Concurrently performed MRI brain and MRA head 06/23/2021. FINDINGS: Motion degradation at the level of the lower neck/upper chest. Standard aortic branching. The common carotid and internal carotid arteries are patent within the neck without hemodynamically significant stenosis (50% or greater). The vertebral arteries are patent within the  neck. The left vertebral artery is dominant. Motion degradation precludes evaluation for stenosis at the origin of the non-dominant right vertebral artery. No appreciable hemodynamically significant stenosis elsewhere within the cervical vertebral arteries. IMPRESSION: The common carotid and internal carotid arteries are patent within the neck without hemodynamically significant stenosis. The vertebral arteries are patent within the neck. Motion degradation precludes evaluation for stenosis at the origin of the non-dominant right vertebral artery. No appreciable hemodynamically significant stenosis elsewhere within the cervical vertebral arteries. Electronically Signed   By: Kellie Simmering D.O.   On: 06/23/2021 12:53   MR BRAIN WO CONTRAST  Result Date: 06/23/2021 CLINICAL DATA:  Stroke, follow-up. Additional history provided: Lightheadedness, right arm weakness, chest pain, headache. EXAM: MRI HEAD WITHOUT CONTRAST MRA HEAD WITHOUT CONTRAST TECHNIQUE: Multiplanar, multi-echo pulse sequences of the brain and surrounding structures were acquired without intravenous contrast. Angiographic images of the Circle of Willis were acquired using MRA technique without intravenous contrast. COMPARISON:  Head CT 06/23/2021. FINDINGS: MRI HEAD FINDINGS Brain: Mild intermittent motion degradation. Cerebral volume is normal. 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 Additional 2 mm acute cortical infarct within the high posterior right frontal lobe (series 9, image 99). A subtle punctate acute infarct is also questioned within the high posterior left frontal lobe (versus artifact) (for instance as seen on series 9, image 98) (series 17, image 57). No evidence of an intracranial mass. No chronic intracranial blood products. No extra-axial fluid collection. No midline shift. Vascular: T2 FLAIR hyperintense signal abnormality within right M2 MCA vessels within the right  sylvian fissure, which may reflect thrombus or slow flow within these vessels. Skull and upper cervical spine: Flow voids otherwise maintained within the proximal large arterial vessels. Sinuses/Orbits: Visualized orbits show no acute finding. No significant paranasal sinus disease. MRA HEAD FINDINGS Moderately motion degraded exam. Anterior circulation: The  intracranial internal carotid arteries are patent. The M1 middle cerebral arteries are patent. 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 No left M2 proximal branch occlusion or severe proximal stenosis is identified. The anterior cerebral arteries are patent. 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 Within the limitations of motion degradation, no intracranial aneurysm is identified. Posterior circulation: The intracranial vertebral arteries are patent. Dominant left vertebral artery. The basilar artery is patent. The posterior cerebral arteries are patent. Fetal origin right posterior cerebral artery. A left posterior communicating artery is also present. Anatomic variants: As described. MRI brain impressions #2 and #3 and MRA head impression #2 called by telephone at the time of interpretation on 06/23/2021 at 12:36 pm to provider Dr. Armandina Gemma, who verbally acknowledged these results. IMPRESSION: MRI brain: 1. Mildly motion degraded exam. 2. 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 3. Additional 2 mm acute cortical infarct within the high posterior right frontal lobe (also within the right MCA vascular territory). 4. Subtle punctate acute infarct (versus artifact) within the high posterior left frontal lobe. MRA head: 1. Moderately motion degraded exam. 2. 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 3. 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 Electronically Signed   By: Kellie Simmering D.O.   On: 06/23/2021 12:45   MR BRAIN W WO CONTRAST  Result Date: 06/24/2021 CLINICAL DATA:  Stroke, follow-up. EXAM: MRI HEAD WITHOUT AND WITH CONTRAST TECHNIQUE: Multiplanar, multiecho pulse sequences of the brain and surrounding structures were obtained without and with intravenous contrast. CONTRAST:  8.35mL GADAVIST GADOBUTROL 1 MMOL/ML IV SOLN COMPARISON:  CT head without contrast 06/24/2021. MR head without contrast 06/23/2021 FINDINGS: Brain: The acute infarct involving the right insular cortex and external capsule is stable. Additional restricted diffusion is now noted along the precentral gyrus on the right. This may correspond to additional left-sided weakness. The punctate focus in the right middle frontal gyrus is again seen. Punctate focus in the left superior frontal gyrus continues to fade. Additional punctate foci are noted in the high anterior right parietal lobe. T2 signal changes are associated with the areas of acute infarction. No acute hemorrhage or mass lesion is present. Minimal white matter changes are present otherwise. The internal auditory canals are within normal limits. The brainstem and cerebellum are within normal limits. The ventricles are of normal size. No significant extraaxial fluid collection is present. Vascular: Flow is present in the major intracranial arteries. Skull and upper cervical spine: The craniocervical junction is normal. Upper cervical spine is within normal limits. Marrow signal is unremarkable. Sinuses/Orbits: The paranasal sinuses and mastoid air cells are clear. The globes and orbits are within normal limits. IMPRESSION: 1. Stable appearance of acute/subacute nonhemorrhagic infarct involving the right insular cortex and external capsule. Stable appearance of acute/subacute nonhemorrhagic infarcts in the anterior frontal lobes bilaterally. 2. Additional  punctate foci of acute nonhemorrhagic infarction involving the right precentral gyrus and right parietal lobe. Question ongoing embolic source of these infarctions. Electronically Signed   By: San Morelle M.D.   On: 06/24/2021 12:52   CT HEAD CODE STROKE WO CONTRAST  Result Date: 06/23/2021 CLINICAL DATA:  Code stroke. Near syncope yesterday. Right arm numbness today. EXAM: CT HEAD WITHOUT CONTRAST TECHNIQUE: Contiguous axial images were obtained from the base of the skull through the vertex without intravenous  contrast. COMPARISON:  04/25/2020 FINDINGS: Brain: No evidence of acute infarction, hemorrhage, hydrocephalus, extra-axial collection or mass lesion/mass effect. Vascular: No hyperdense vessel or unexpected calcification. Skull: Normal. Negative for fracture or focal lesion. Sinuses/Orbits: No acute finding. Other: These results were communicated to Dr Quinn Axe at 9:34 am on 06/23/2021 by text page via the [employer] messaging system. Motion artifact words the vertex. ASPECTS [facility] Stroke Program Early CT Score) - Ganglionic level infarction (caudate, lentiform nuclei, internal capsule, insula, M1-M3 cortex): 7 - Supraganglionic infarction (M4-M6 cortex): 3 Total score (0-10 with 10 being normal): 10 IMPRESSION: No acute finding. Electronically Signed   By: Jorje Guild M.D.   On: 06/23/2021 09:35     LOS: 1 day   Antonieta Pert, MD [facility]  06/24/2021, 1:24 PM

## 2021-06-24 NOTE — Progress Notes (Signed)
Pt brought down to MRI for exam via pt transport. Pt safety screened, prepared for exam, given alert squeeze ball and began scanning. Following the first sequence of post-contrast imaging after contrast injection, pt began moving around in scanner, upon checking on pt, they stated they felt "itching all over". Rad called for consult for possible contrast reaction. Pt brought out of scanner as symptoms developed into coughing along with itching. RN called to administer Benadryl per Rad order. Following Benadryl administration, pt's symptoms improved. Scanning resumed following Rad clearance to continue. Completed remainder of exam.

## 2021-06-25 DIAGNOSIS — E7800 Pure hypercholesterolemia, unspecified: Secondary | ICD-10-CM | POA: Diagnosis not present

## 2021-06-25 DIAGNOSIS — I63411 Cerebral infarction due to embolism of right middle cerebral artery: Principal | ICD-10-CM

## 2021-06-25 DIAGNOSIS — T505X5A Adverse effect of appetite depressants, initial encounter: Secondary | ICD-10-CM

## 2021-06-25 LAB — CBC
HCT: 34.3 % — ABNORMAL LOW (ref 36.0–46.0)
Hemoglobin: 10.5 g/dL — ABNORMAL LOW (ref 12.0–15.0)
MCH: 24.2 pg — ABNORMAL LOW (ref 26.0–34.0)
MCHC: 30.6 g/dL (ref 30.0–36.0)
MCV: 79 fL — ABNORMAL LOW (ref 80.0–100.0)
Platelets: 230 10*3/uL (ref 150–400)
RBC: 4.34 MIL/uL (ref 3.87–5.11)
RDW: 13.6 % (ref 11.5–15.5)
WBC: 4.1 10*3/uL (ref 4.0–10.5)
nRBC: 0 % (ref 0.0–0.2)

## 2021-06-25 LAB — BASIC METABOLIC PANEL
Anion gap: 7 (ref 5–15)
BUN: 5 mg/dL — ABNORMAL LOW (ref 6–20)
CO2: 21 mmol/L — ABNORMAL LOW (ref 22–32)
Calcium: 8.3 mg/dL — ABNORMAL LOW (ref 8.9–10.3)
Chloride: 108 mmol/L (ref 98–111)
Creatinine, Ser: 0.83 mg/dL (ref 0.44–1.00)
GFR, Estimated: >60 mL/min (ref >60)
Glucose, Bld: 113 mg/dL — ABNORMAL HIGH (ref 70–99)
Potassium: 3.5 mmol/L (ref 3.5–5.1)
Sodium: 136 mmol/L (ref 135–145)

## 2021-06-25 LAB — RPR: RPR Ser Ql: NONREACTIVE

## 2021-06-25 LAB — PROTEIN C, TOTAL: Protein C, Total: 120 % (ref 60–150)

## 2021-06-25 MED ORDER — BUTALBITAL-APAP-CAFFEINE 50-325-40 MG PO TABS
1.0000 | ORAL_TABLET | ORAL | Status: DC | PRN
  Administered 2021-06-25: 1 via ORAL
  Filled 2021-06-25: qty 1

## 2021-06-25 MED ORDER — CLOPIDOGREL BISULFATE 75 MG PO TABS: 75.0000 mg | ORAL_TABLET | Freq: Every day | ORAL | 0 refills | Status: DC

## 2021-06-25 MED ORDER — ATORVASTATIN CALCIUM 40 MG PO TABS: 40.0000 mg | ORAL_TABLET | Freq: Every day | ORAL | 0 refills | Status: DC

## 2021-06-25 MED ORDER — BUTALBITAL-APAP-CAFFEINE 50-325-40 MG PO TABS: 1.0000 | ORAL_TABLET | Freq: Three times a day (TID) | ORAL | Status: DC | PRN

## 2021-06-25 MED ORDER — ASPIRIN 81 MG PO TBEC: 81.0000 mg | DELAYED_RELEASE_TABLET | Freq: Every day | ORAL | 11 refills | Status: AC

## 2021-06-25 NOTE — Discharge Summary (Signed)
Physician Discharge Summary  ICY FUHRMANN HQP:[id number] DOB: 02-08-1976 DOA: 06/23/2021  PCP: Laurey Morale, MD  Admit date: 06/23/2021 Discharge date: 06/25/2021  Admitted From: home Disposition:  home  Recommendations for Outpatient Follow-up:  Follow up with PCP in 1-2 weeks Follow-up with cardiology/neurology for outpatient TEE   Home Health:NO  Equipment/Devices: NONE  Discharge Condition: Stable Code Status:   Code Status: Full Code Diet recommendation:  Diet Order             Diet Carb Modified Fluid consistency: Thin; Room service appropriate? Yes  Diet effective now                    Brief/Interim Summary: Angela Hahn, 45 y.o. female with PMH of IDA, obesity presents with lightheadedness, right arm weakness and chest pain and headache was still on ED. As per the reports she had episode of lightheadedness feeling about to faint while getting out of the restaurant, no arm or leg weakness, went home and overnight did okay got up at 7 AM however when she was standing she felt lightheaded, chest pain with heart palpitation and also right arm weakness, heaviness not able to turn the doorknob.  She was seen in the ED had left frontal headache throbbing pain with nausea, blood pressure 110/70. 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 Patient was transferred to [facility] and admitted. 10/28 early morning had similar weakness on the left hand underwent repeat CT head-no acute finding At this time she had some weakness on the left grip otherwise remains fairly nonfocal, seen by neurology  and advised to discharge on aspirin Plavix x21 days outpatient TEE and outpatient follow-up with neurology. She feels well and feels ready for home today  Discharge Diagnoses:  Weakness right side and left side/ACUTE STROKE: Suspecting stroke in the setting of OCP use and phentarimine use: Presented with right-sided weakness but again 10/20 8 AM had left-sided weakness.  Initial MRI brain 10/27 showed acute infarct in the right corona, right insula cortical infarct on the high posterior right frontal lobe, subtle punctate acute infarct versus artifact in the left posterior frontal-MRI with and without contrast this morning- stable acute/subacute nonhemorrhagic infarct involving the right insular cortex and external capsule. Stable appearance of acute/subacute nonhemorrhagic infarcts in the anterior frontal lobes bilaterally. 2. Additional punctate foci of acute nonhemorrhagic infarction involving the right precentral gyrus and right parietal lobe" ?  Ongoing embolic source, await for further recommendation by neurology hypercoagulable work-up in process.  On aspirin and Plavix-to be continued for 3 weeks afterwards aspirin alone,LDL 95 target less than 70, HbA1c 6.3-indicating prediabetes-patient educated.  Added Lipitor.  Unremarkable TTE, MRA head/neck - head occlusion versus severe near occlusive stenosis of proximal to mid right M2 MCA vessel.  Plan is for outpatient TEE and ambulatory follow-up with neurology and okay to discharge home today, cleared by PT OT.  Prediabetes-dietary counseling. Diet control.  Class I Obesity:Patient's Body mass index is 31.96 kg/m. : Will benefit with PCP follow-up, weight loss  healthy lifestyle and outpatient sleep evaluation. She has lost 1 lb recently and going to gym and eating healthy  Consults: NEURO  Subjective: AAOX3, WEAK left grip otherwise normal  Discharge Exam: Vitals:   06/25/21 0649 06/25/21 0755  BP: 135/67 126/66  Pulse: 60 75  Resp: 18    Temp: 98.4 F (36.9 C) 98.2 F (36.8 C)  SpO2: 98% 100%   General: Pt is alert, awake, not in acute distress Cardiovascular: RRR, S1/S2 +, no rubs, no gallops Respiratory: CTA bilaterally, no wheezing, no rhonchi Abdominal: Soft, NT, ND, bowel sounds + Extremities: no edema, no cyanosis  Discharge Instructions  Discharge Instructions     Ambulatory referral to Cardiology   Complete by: As directed    TEE as OP for stroke work up   Ambulatory referral to Neurology   Complete by: As directed    Follow up with Dr. Leonie Man at [facility] in 4-6 weeks. Too complicated for RN to follow. Thanks.   Discharge instructions   Complete by: As directed    You will need outpatient TEE-follow-up with neurology  Do not take care of oral contraceptive pills and phentarimine and discuss with your primary care doctor nd gynecologist.       Please call call MD or return to ER for similar or worsening recurring problem that brought you to hospital or if any fever,nausea/vomiting,abdominal pain, uncontrolled pain, chest pain,  shortness of breath or any other alarming symptoms.  Please follow-up your doctor as instructed in a week time and call the office for appointment.  Please avoid alcohol, smoking, or any other illicit substance and maintain healthy habits including taking your regular medications as prescribed.  You were cared for by a hospitalist during your hospital stay. If you have any questions about your discharge medications or the care you received while you were in the hospital after you are discharged, you can call the unit and ask to speak with the hospitalist on call if the hospitalist that took care of you is not available.  Once you are discharged, your primary care physician will handle any further medical issues. Please note that NO REFILLS for any discharge medications will be authorized once you are discharged, as it is imperative that you return to your primary care physician (or  establish a relationship with a primary care physician if you do not have one) for your aftercare needs so that they can reassess your need for medications and monitor your lab values   Increase activity slowly   Complete by: As directed       Allergies as of 06/25/2021       Reactions   Gadolinium Derivatives Itching, Swelling, Cough   Hydrocodone    Itching, takes Benadryl with this medicine   Tramadol Other (See Comments)   headache        Medication List     STOP taking these medications    meloxicam 15 MG tablet Commonly known as: MOBIC   Norgestimate-Ethinyl Estradiol Triphasic 0.18/0.215/0.25 MG-35 MCG tablet Commonly known as: Tri-Sprintec   phentermine 37.5 MG capsule       TAKE these medications    acetaminophen 500 MG tablet Commonly known as: TYLENOL Take 1,000 mg by mouth every 6 (six) hours as needed for mild pain or headache.   aspirin 81 MG EC tablet Take 1 tablet (81 mg total) by mouth daily. Swallow whole. Start taking on: June 26, 2021   atorvastatin 40 MG tablet Commonly known as: LIPITOR Take 1 tablet (40 mg total) by mouth daily. Start taking on: June 26, 2021   clopidogrel 75 MG tablet Commonly known as:  PLAVIX Take 1 tablet (75 mg total) by mouth daily for 19 days. Start taking on: June 26, 2021   ferrous sulfate 325 (65 FE) MG tablet Take 1 tablet (325 mg total) by mouth 2 (two) times daily with a meal. What changed: when to take this   ondansetron 8 MG tablet Commonly known as: Zofran Take 1 tablet (8 mg total) by mouth every 6 (six) hours as needed for nausea or vomiting.   SUMAtriptan 100 MG tablet Commonly known as: Imitrex Take one at onset of migraine and may repeat 1 in 2 hours.  Maximum of 2 in 24 hours.   tizanidine 2 MG capsule Commonly known as: ZANAFLEX TAKE ONE CAPSULE BY MOUTH THREE TIMES A DAY What changed:  when to take this reasons to take this   triamcinolone cream 0.1 % Commonly known as:  KENALOG Apply 1 application topically 2 (two) times daily. What changed:  when to take this reasons to take this        Follow-up Information     Garvin Fila, MD. Schedule an appointment as soon as possible for a visit in 1 month(s).   Specialties: Neurology, Radiology Why: stroke clinic Contact information: [address] [place] [address] [phone]         Laurey Morale, MD Follow up in 1 week(s).   Specialty: Family Medicine Contact information: [place] [address] [phone]                Allergies  Allergen Reactions   Gadolinium Derivatives Itching, Swelling and Cough   Hydrocodone     Itching, takes Benadryl with this medicine   Tramadol Other (See Comments)    headache    The results of significant diagnostics from this hospitalization (including imaging, microbiology, ancillary and laboratory) are listed below for reference.    Microbiology: Recent Results (from the past 240 hour(s))  Resp Panel by RT-PCR (Flu A&B, Covid) Nasopharyngeal Swab     Status: None   Collection Time: 06/23/21  9:34 AM   Specimen: Nasopharyngeal Swab; Nasopharyngeal(NP) swabs in vial transport medium  Result Value Ref Range Status   SARS Coronavirus 2 by RT PCR NEGATIVE NEGATIVE Final    Comment: (NOTE) SARS-CoV-2 target nucleic acids are NOT DETECTED.  The SARS-CoV-2 RNA is generally detectable in upper respiratory specimens during the acute phase of infection. The lowest concentration of SARS-CoV-2 viral copies this assay can detect is 138 copies/mL. A negative result does not preclude SARS-Cov-2 infection and should not be used as the sole basis for treatment or other patient management decisions. A negative result may occur with  improper specimen collection/handling, submission of specimen other than nasopharyngeal swab, presence of viral mutation(s) within the areas targeted by this assay, and inadequate  number of viral copies(<138 copies/mL). A negative result must be combined with clinical observations, patient history, and epidemiological information. The expected result is Negative.  Fact Sheet for Patients:  EntrepreneurPulse.com.au  Fact Sheet for Healthcare Providers:  IncredibleEmployment.be  This test is no t yet approved or cleared by the [place] FDA and  has been authorized for detection and/or diagnosis of SARS-CoV-2 by FDA under an Emergency Use Authorization (EUA). This EUA will remain  in effect (meaning this test can be used) for the duration of the COVID-19 declaration under Section 564(b)(1) of the Act, 21 U.S.C.section 360bbb-3(b)(1), unless the authorization is terminated  or revoked sooner.       Influenza A  by PCR NEGATIVE NEGATIVE Final   Influenza B by PCR NEGATIVE NEGATIVE Final    Comment: (NOTE) The Xpert Xpress SARS-CoV-2/FLU/RSV plus assay is intended as an aid in the diagnosis of influenza from Nasopharyngeal swab specimens and should not be used as a sole basis for treatment. Nasal washings and aspirates are unacceptable for Xpert Xpress SARS-CoV-2/FLU/RSV testing.  Fact Sheet for Patients: EntrepreneurPulse.com.au  Fact Sheet for Healthcare Providers: IncredibleEmployment.be  This test is not yet approved or cleared by the [place] FDA and has been authorized for detection and/or diagnosis of SARS-CoV-2 by FDA under an Emergency Use Authorization (EUA). This EUA will remain in effect (meaning this test can be used) for the duration of the COVID-19 declaration under Section 564(b)(1) of the Act, 21 U.S.C. section 360bbb-3(b)(1), unless the authorization is terminated or revoked.  Performed at [facility], [place] [address] [place], [place] [address]     Procedures/Studies: CT Angio Head W or Wo Contrast  Result Date: 06/23/2021 CLINICAL  DATA:  Neuro deficit, acute, stroke suspected; right arm weakness, acute stroke on MRI EXAM: CT ANGIOGRAPHY HEAD AND NECK TECHNIQUE: Multidetector CT imaging of the head and neck was performed using the standard protocol during bolus administration of intravenous contrast. Multiplanar CT image reconstructions and MIPs were obtained to evaluate the vascular anatomy. Carotid stenosis measurements (when applicable) are obtained utilizing NASCET criteria, using the distal internal carotid diameter as the denominator. CONTRAST:  39mL OMNIPAQUE IOHEXOL 350 MG/ML SOLN COMPARISON:  Correlation made with recent MR imaging FINDINGS: CTA NECK Aortic arch: Great vessel origins are patent. Right carotid system: Patent.  No stenosis. Left carotid system: Patent.  No stenosis. Vertebral arteries: Patent. Left vertebral artery is dominant. No stenosis. Skeleton: Facet predominant cervical spine degenerative changes. Other neck: Unremarkable. Upper chest: There is a 2 mm nodule of the right upper lobe (series 5, image 12). Review of the MIP images confirms the above findings CTA HEAD Anterior circulation: Intracranial internal carotid arteries are patent. Anterior cerebral arteries are patent. Left A1 ACA is dominant. Anterior communicating artery is present. Middle cerebral arteries are patent. Probable stenosis or occlusion of small mid right M2 MCA branch in the region of insular infarct on MRI. This corresponds to questioned abnormality on MRA. Posterior circulation: Intracranial vertebral arteries are patent. Basilar artery is patent. Major cerebellar artery origins are patent. Bilateral posterior communicating arteries are present. Posterior cerebral arteries are patent. Venous sinuses: Patent as allowed by contrast bolus timing. Review of the MIP images confirms the above findings IMPRESSION: No large vessel occlusion. No hemodynamically significant stenosis in the neck. Probable stenosis or occlusion of a small mid right M2  MCA branch in the region of insular infarct on MRI. This may correspond to abnormality seen on MRA. 2 mm right upper lobe nodule. No follow-up needed if patient is low-risk. Non-contrast chest CT can be considered in 12 months if patient is high-risk. This recommendation follows the consensus statement: Guidelines for Management of Incidental Pulmonary Nodules Detected on CT Images: From the Fleischner Society 2017; Radiology 2017; 284:228-243. Electronically Signed   By: Macy Mis M.D.   On: 06/23/2021 13:33   DG Chest 2 View  Result Date: 06/23/2021 CLINICAL DATA:  Chest pain EXAM: CHEST - 2 VIEW COMPARISON:  Chest x-ray 04/25/2020 FINDINGS: Heart size and mediastinal contours are within normal limits. No suspicious pulmonary opacities identified. No pleural effusion or pneumothorax visualized. No acute osseous abnormality appreciated. IMPRESSION: No acute intrathoracic process identified. Electronically Signed  By: Ofilia Neas M.D.   On: 06/23/2021 12:16   CT HEAD WO CONTRAST (5MM)  Result Date: 06/24/2021 CLINICAL DATA:  Stroke follow-up. EXAM: CT HEAD WITHOUT CONTRAST TECHNIQUE: Contiguous axial images were obtained from the base of the skull through the vertex without intravenous contrast. COMPARISON:  Plain brain CT and MR head, dated June 23, 2021 FINDINGS: Brain: No evidence of acute infarction, hemorrhage, hydrocephalus, extra-axial collection or mass lesion/mass effect. Vascular: No hyperdense vessel or unexpected calcification. Skull: Normal. Negative for fracture or focal lesion. Sinuses/Orbits: No acute finding. Other: None. IMPRESSION: 1. No acute intracranial pathology. 2. No CT abnormality to correspond to the acute infarct within the right corona radiata seen on the prior MR head. Correlation with follow-up MRI is recommended. Electronically Signed   By: Virgina Norfolk M.D.   On: 06/24/2021 01:58   CT Angio Neck W and/or Wo Contrast  Result Date: 06/23/2021 CLINICAL  DATA:  Neuro deficit, acute, stroke suspected; right arm weakness, acute stroke on MRI EXAM: CT ANGIOGRAPHY HEAD AND NECK TECHNIQUE: Multidetector CT imaging of the head and neck was performed using the standard protocol during bolus administration of intravenous contrast. Multiplanar CT image reconstructions and MIPs were obtained to evaluate the vascular anatomy. Carotid stenosis measurements (when applicable) are obtained utilizing NASCET criteria, using the distal internal carotid diameter as the denominator. CONTRAST:  34mL OMNIPAQUE IOHEXOL 350 MG/ML SOLN COMPARISON:  Correlation made with recent MR imaging FINDINGS: CTA NECK Aortic arch: Great vessel origins are patent. Right carotid system: Patent.  No stenosis. Left carotid system: Patent.  No stenosis. Vertebral arteries: Patent. Left vertebral artery is dominant. No stenosis. Skeleton: Facet predominant cervical spine degenerative changes. Other neck: Unremarkable. Upper chest: There is a 2 mm nodule of the right upper lobe (series 5, image 12). Review of the MIP images confirms the above findings CTA HEAD Anterior circulation: Intracranial internal carotid arteries are patent. Anterior cerebral arteries are patent. Left A1 ACA is dominant. Anterior communicating artery is present. Middle cerebral arteries are patent. Probable stenosis or occlusion of small mid right M2 MCA branch in the region of insular infarct on MRI. This corresponds to questioned abnormality on MRA. Posterior circulation: Intracranial vertebral arteries are patent. Basilar artery is patent. Major cerebellar artery origins are patent. Bilateral posterior communicating arteries are present. Posterior cerebral arteries are patent. Venous sinuses: Patent as allowed by contrast bolus timing. Review of the MIP images confirms the above findings IMPRESSION: No large vessel occlusion. No hemodynamically significant stenosis in the neck. Probable stenosis or occlusion of a small mid right M2  MCA branch in the region of insular infarct on MRI. This may correspond to abnormality seen on MRA. 2 mm right upper lobe nodule. No follow-up needed if patient is low-risk. Non-contrast chest CT can be considered in 12 months if patient is high-risk. This recommendation follows the consensus statement: Guidelines for Management of Incidental Pulmonary Nodules Detected on CT Images: From the Fleischner Society 2017; Radiology 2017; 284:228-243. Electronically Signed   By: Macy Mis M.D.   On: 06/23/2021 13:33   MR ANGIO HEAD WO CONTRAST  Result Date: 06/23/2021 CLINICAL DATA:  Stroke, follow-up. Additional history provided: Lightheadedness, right arm weakness, chest pain, headache. EXAM: MRI HEAD WITHOUT CONTRAST MRA HEAD WITHOUT CONTRAST TECHNIQUE: Multiplanar, multi-echo pulse sequences of the brain and surrounding structures were acquired without intravenous contrast. Angiographic images of the Circle of Willis were acquired using MRA technique without intravenous contrast. COMPARISON:  Head CT 06/23/2021. FINDINGS: MRI HEAD  FINDINGS Brain: Mild intermittent motion degradation. Cerebral volume is normal. 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 Additional 2 mm acute cortical infarct within the high posterior right frontal lobe (series 9, image 99). A subtle punctate acute infarct is also questioned within the high posterior left frontal lobe (versus artifact) (for instance as seen on series 9, image 98) (series 17, image 57). No evidence of an intracranial mass. No chronic intracranial blood products. No extra-axial fluid collection. No midline shift. Vascular: T2 FLAIR hyperintense signal abnormality within right M2 MCA vessels within the right sylvian fissure, which may reflect thrombus or slow flow within these vessels. Skull and upper cervical spine: Flow voids otherwise maintained within the proximal large arterial vessels. Sinuses/Orbits:  Visualized orbits show no acute finding. No significant paranasal sinus disease. MRA HEAD FINDINGS Moderately motion degraded exam. Anterior circulation: The intracranial internal carotid arteries are patent. The M1 middle cerebral arteries are patent. 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 No left M2 proximal branch occlusion or severe proximal stenosis is identified. The anterior cerebral arteries are patent. 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 Within the limitations of motion degradation, no intracranial aneurysm is identified. Posterior circulation: The intracranial vertebral arteries are patent. Dominant left vertebral artery. The basilar artery is patent. The posterior cerebral arteries are patent. Fetal origin right posterior cerebral artery. A left posterior communicating artery is also present. Anatomic variants: As described. MRI brain impressions #2 and #3 and MRA head impression #2 called by telephone at the time of interpretation on 06/23/2021 at 12:36 pm to provider Dr. Armandina Gemma, who verbally acknowledged these results. IMPRESSION: MRI brain: 1. Mildly motion degraded exam. 2. 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 3. Additional 2 mm acute cortical infarct within the high posterior right frontal lobe (also within the right MCA vascular territory). 4. Subtle punctate acute infarct (versus artifact) within the high posterior left frontal lobe. MRA head: 1. Moderately motion degraded exam. 2. 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 3. 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 Electronically Signed   By: Kellie Simmering D.O.   On: 06/23/2021 12:45   MR ANGIO NECK WO CONTRAST  Result Date: 06/23/2021 CLINICAL  DATA:  Stroke, follow-up. Additional history provided: Lightheadedness, right arm weakness, chest pain and headache. EXAM: MRA NECK WITHOUT CONTRAST TECHNIQUE: Angiographic images of the neck were acquired using MRA technique without intravenous contrast. Carotid stenosis measurements (when applicable) are obtained utilizing NASCET criteria, using the distal internal carotid diameter as the denominator. COMPARISON:  CT angiogram chest 09/28/2011. Concurrently performed MRI brain and MRA head 06/23/2021. FINDINGS: Motion degradation at the level of the lower neck/upper chest. Standard aortic branching. The common carotid and internal carotid arteries are patent within the neck without hemodynamically significant stenosis (50% or greater). The vertebral arteries are patent within the neck. The left vertebral artery is dominant. Motion degradation precludes evaluation for stenosis at the origin of the non-dominant right vertebral artery. No appreciable hemodynamically significant stenosis elsewhere within the cervical vertebral arteries. IMPRESSION: The common carotid and internal carotid arteries are patent within the neck without hemodynamically significant stenosis. The vertebral arteries are patent within the neck. Motion degradation precludes evaluation for stenosis at the origin of the non-dominant right vertebral artery. No  appreciable hemodynamically significant stenosis elsewhere within the cervical vertebral arteries. Electronically Signed   By: Kellie Simmering D.O.   On: 06/23/2021 12:53   MR BRAIN WO CONTRAST  Result Date: 06/23/2021 CLINICAL DATA:  Stroke, follow-up. Additional history provided: Lightheadedness, right arm weakness, chest pain, headache. EXAM: MRI HEAD WITHOUT CONTRAST MRA HEAD WITHOUT CONTRAST TECHNIQUE: Multiplanar, multi-echo pulse sequences of the brain and surrounding structures were acquired without intravenous contrast. Angiographic images of the Circle of Willis were acquired  using MRA technique without intravenous contrast. COMPARISON:  Head CT 06/23/2021. FINDINGS: MRI HEAD FINDINGS Brain: Mild intermittent motion degradation. Cerebral volume is normal. 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 Additional 2 mm acute cortical infarct within the high posterior right frontal lobe (series 9, image 99). A subtle punctate acute infarct is also questioned within the high posterior left frontal lobe (versus artifact) (for instance as seen on series 9, image 98) (series 17, image 57). No evidence of an intracranial mass. No chronic intracranial blood products. No extra-axial fluid collection. No midline shift. Vascular: T2 FLAIR hyperintense signal abnormality within right M2 MCA vessels within the right sylvian fissure, which may reflect thrombus or slow flow within these vessels. Skull and upper cervical spine: Flow voids otherwise maintained within the proximal large arterial vessels. Sinuses/Orbits: Visualized orbits show no acute finding. No significant paranasal sinus disease. MRA HEAD FINDINGS Moderately motion degraded exam. Anterior circulation: The intracranial internal carotid arteries are patent. The M1 middle cerebral arteries are patent. 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 No left M2 proximal branch occlusion or severe proximal stenosis is identified. The anterior cerebral arteries are patent. 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 Within the limitations of motion degradation, no intracranial aneurysm is identified. Posterior circulation: The intracranial vertebral arteries are patent. Dominant left vertebral artery. The basilar artery is patent. The posterior cerebral arteries are patent. Fetal origin right posterior cerebral artery. A left posterior communicating artery is  also present. Anatomic variants: As described. MRI brain impressions #2 and #3 and MRA head impression #2 called by telephone at the time of interpretation on 06/23/2021 at 12:36 pm to provider Dr. Armandina Gemma, who verbally acknowledged these results. IMPRESSION: MRI brain: 1. Mildly motion degraded exam. 2. 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 3. Additional 2 mm acute cortical infarct within the high posterior right frontal lobe (also within the right MCA vascular territory). 4. Subtle punctate acute infarct (versus artifact) within the high posterior left frontal lobe. MRA head: 1. Moderately motion degraded exam. 2. 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 3. 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 Electronically Signed   By: Kellie Simmering D.O.   On: 06/23/2021 12:45   MR BRAIN W WO CONTRAST  Result Date: 06/24/2021 CLINICAL DATA:  Stroke, follow-up. EXAM: MRI HEAD WITHOUT AND WITH CONTRAST TECHNIQUE: Multiplanar, multiecho pulse sequences of the brain and surrounding structures were obtained without and with intravenous contrast. CONTRAST:  8.56mL GADAVIST GADOBUTROL 1 MMOL/ML IV SOLN COMPARISON:  CT head without contrast 06/24/2021. MR head without contrast 06/23/2021 FINDINGS: Brain: The acute infarct involving the right insular cortex and external capsule is stable. Additional restricted diffusion is now noted along the precentral gyrus on the right. This may correspond to additional left-sided  weakness. The punctate focus in the right middle frontal gyrus is again seen. Punctate focus in the left superior frontal gyrus continues to fade. Additional punctate foci are noted in the high anterior right parietal lobe. T2 signal changes are associated with the areas of acute infarction. No acute hemorrhage or mass lesion is present. Minimal white matter changes are  present otherwise. The internal auditory canals are within normal limits. The brainstem and cerebellum are within normal limits. The ventricles are of normal size. No significant extraaxial fluid collection is present. Vascular: Flow is present in the major intracranial arteries. Skull and upper cervical spine: The craniocervical junction is normal. Upper cervical spine is within normal limits. Marrow signal is unremarkable. Sinuses/Orbits: The paranasal sinuses and mastoid air cells are clear. The globes and orbits are within normal limits. IMPRESSION: 1. Stable appearance of acute/subacute nonhemorrhagic infarct involving the right insular cortex and external capsule. Stable appearance of acute/subacute nonhemorrhagic infarcts in the anterior frontal lobes bilaterally. 2. Additional punctate foci of acute nonhemorrhagic infarction involving the right precentral gyrus and right parietal lobe. Question ongoing embolic source of these infarctions. Electronically Signed   By: San Morelle M.D.   On: 06/24/2021 12:52   VAS [place] TRANSCRANIAL DOPPLER W BUBBLES  Result Date: 06/24/2021  Transcranial Doppler with Bubble Patient Name:  TSION INGHRAM  Date of Exam:   06/24/2021 Medical Rec #: [id number]      Accession #:    [id number] Date of Birth: [birth year]      Patient Gender: F Patient Age:   54 years Exam Location:  [facility] Procedure:      VAS [place] TRANSCRANIAL DOPPLER W BUBBLES Referring Phys: Cornelius Moras XU --------------------------------------------------------------------------------  Indications: Stroke. Performing Technologist: Archie Patten RVS  Examination Guidelines: A complete evaluation includes B-mode imaging, spectral Doppler, color Doppler, and power Doppler as needed of all accessible portions of each vessel. Bilateral testing is considered an integral part of a complete examination. Limited examinations for reoccurring indications may be performed as noted.  Summary: No HITS at rest or  during Valsalva. Negative transcranial Doppler Bubble study with no evidence of right to left intracardiac communication.  A vascular evaluation was performed. The right middle cerebral artery was studied. An IV was inserted into the patient's left forearm. Verbal informed consent was obtained.  *See table(s) above for TCD measurements and observations.  Diagnosing physician: Jamelle Haring Electronically signed by Jamelle Haring on 06/24/2021 at 5:02:15 PM.    Final    ECHOCARDIOGRAM COMPLETE  Result Date: 06/24/2021    ECHOCARDIOGRAM REPORT   Patient Name:   SHEIKA COUTTS Date of Exam: 06/24/2021 Medical Rec #:  [id number]     Height:       66.0 in Accession #:    [id number]    Weight:       198.0 lb Date of Birth:  [birth year]     BSA:          1.991 m Patient Age:    19 years      BP:           147/74 mmHg Patient Gender: F             HR:           79 bpm. Exam Location:  Inpatient Procedure: 2D Echo, Cardiac Doppler and Color Doppler Indications:    Stroke  History:        Patient has no prior history of Echocardiogram examinations.  Sonographer:  Jyl Heinz Referring Phys: [id number] [place]  1. Left ventricular ejection fraction, by estimation, is 60 to 65%. The left ventricle has normal function. The left ventricle has no regional wall motion abnormalities. Left ventricular diastolic parameters were normal.  2. Right ventricular systolic function is normal. The right ventricular size is normal.  3. The mitral valve is grossly normal. Trivial mitral valve regurgitation.  4. The aortic valve is normal in structure. Aortic valve regurgitation is not visualized. No aortic stenosis is present. FINDINGS  Left Ventricle: Left ventricular ejection fraction, by estimation, is 60 to 65%. The left ventricle has normal function. The left ventricle has no regional wall motion abnormalities. The left ventricular internal cavity size was normal in size. There is  no left ventricular hypertrophy. Left  ventricular diastolic parameters were normal. Right Ventricle: The right ventricular size is normal. Right vetricular wall thickness was not well visualized. Right ventricular systolic function is normal. Left Atrium: Left atrial size was normal in size. Right Atrium: Right atrial size was normal in size. Pericardium: There is no evidence of pericardial effusion. Mitral Valve: The mitral valve is grossly normal. Trivial mitral valve regurgitation. Tricuspid Valve: The tricuspid valve is grossly normal. Tricuspid valve regurgitation is trivial. Aortic Valve: The aortic valve is normal in structure. Aortic valve regurgitation is not visualized. No aortic stenosis is present. Aortic valve peak gradient measures 8.1 mmHg. Pulmonic Valve: The pulmonic valve was not well visualized. Pulmonic valve regurgitation is trivial. Aorta: The aortic root and ascending aorta are structurally normal, with no evidence of dilitation. IAS/Shunts: The interatrial septum was not well visualized.  LEFT VENTRICLE PLAX 2D LVIDd:         5.10 cm      Diastology LVIDs:         3.80 cm      LV e' medial:    7.29 cm/s LV PW:         1.00 cm      LV E/e' medial:  7.4 LV IVS:        1.00 cm      LV e' lateral:   7.40 cm/s LVOT diam:     2.00 cm      LV E/e' lateral: 7.3 LV SV:         57 LV SV Index:   29 LVOT Area:     3.14 cm  LV Volumes (MOD) LV vol d, MOD A2C: 112.0 ml LV vol d, MOD A4C: 138.0 ml LV vol s, MOD A2C: 53.6 ml LV vol s, MOD A4C: 66.9 ml LV SV MOD A2C:     58.4 ml LV SV MOD A4C:     138.0 ml LV SV MOD BP:      69.9 ml RIGHT VENTRICLE             IVC RV Basal diam:  3.90 cm     IVC diam: 2.00 cm RV Mid diam:    2.80 cm RV S prime:     12.00 cm/s TAPSE (M-mode): 2.1 cm LEFT ATRIUM           Index        RIGHT ATRIUM           Index LA diam:      3.70 cm 1.86 cm/m   RA Area:     13.70 cm LA Vol (A2C): 59.8 ml 30.03 ml/m  RA Volume:   35.20 ml  17.68 ml/m LA Vol (A4C): 28.8 ml  14.46 ml/m  AORTIC VALVE AV Area (Vmax): 2.29 cm AV  Vmax:        142.50 cm/s AV Peak Grad:   8.1 mmHg LVOT Vmax:      103.65 cm/s LVOT Vmean:     72.200 cm/s LVOT VTI:       0.182 m  AORTA Ao Root diam: 3.00 cm Ao Asc diam:  2.80 cm MITRAL VALVE MV Area (PHT): 4.80 cm    SHUNTS MV Decel Time: 158 msec    Systemic VTI:  0.18 m MV E velocity: 54.30 cm/s  Systemic Diam: 2.00 cm MV A velocity: 65.00 cm/s MV E/A ratio:  0.84 Mertie Moores MD Electronically signed by Mertie Moores MD Signature Date/Time: 06/24/2021/5:21:05 PM    Final    CT HEAD CODE STROKE WO CONTRAST  Result Date: 06/23/2021 CLINICAL DATA:  Code stroke. Near syncope yesterday. Right arm numbness today. EXAM: CT HEAD WITHOUT CONTRAST TECHNIQUE: Contiguous axial images were obtained from the base of the skull through the vertex without intravenous contrast. COMPARISON:  04/25/2020 FINDINGS: Brain: No evidence of acute infarction, hemorrhage, hydrocephalus, extra-axial collection or mass lesion/mass effect. Vascular: No hyperdense vessel or unexpected calcification. Skull: Normal. Negative for fracture or focal lesion. Sinuses/Orbits: No acute finding. Other: These results were communicated to Dr Quinn Axe at 9:34 am on 06/23/2021 by text page via the [facility] messaging system. Motion artifact words the vertex. ASPECTS [facility] Stroke Program Early CT Score) - Ganglionic level infarction (caudate, lentiform nuclei, internal capsule, insula, M1-M3 cortex): 7 - Supraganglionic infarction (M4-M6 cortex): 3 Total score (0-10 with 10 being normal): 10 IMPRESSION: No acute finding. Electronically Signed   By: Jorje Guild M.D.   On: 06/23/2021 09:35   VAS [place] LOWER EXTREMITY VENOUS (DVT)  Result Date: 06/24/2021  Lower Venous DVT Study Patient Name:  LEEYA RUSCONI  Date of Exam:   06/24/2021 Medical Rec #: [id number]      Accession #:    [id number] Date of Birth: Jun 14, 1976      Patient Gender: F Patient Age:   6 years Exam Location:  [facility] Procedure:      VAS [place] LOWER EXTREMITY VENOUS (DVT)  Referring Phys: Cornelius Moras XU --------------------------------------------------------------------------------  Indications: Stroke.  Comparison Study: no prior Performing Technologist: Archie Patten RVS  Examination Guidelines: A complete evaluation includes B-mode imaging, spectral Doppler, color Doppler, and power Doppler as needed of all accessible portions of each vessel. Bilateral testing is considered an integral part of a complete examination. Limited examinations for reoccurring indications may be performed as noted. The reflux portion of the exam is performed with the patient in reverse Trendelenburg.  +---------+---------------+---------+-----------+----------+--------------+ RIGHT    CompressibilityPhasicitySpontaneityPropertiesThrombus Aging +---------+---------------+---------+-----------+----------+--------------+ CFV      Full           Yes      Yes                                 +---------+---------------+---------+-----------+----------+--------------+ SFJ      Full                                                        +---------+---------------+---------+-----------+----------+--------------+ FV Prox  Full                                                        +---------+---------------+---------+-----------+----------+--------------+  FV Mid   Full                                                        +---------+---------------+---------+-----------+----------+--------------+ FV DistalFull                                                        +---------+---------------+---------+-----------+----------+--------------+ PFV      Full                                                        +---------+---------------+---------+-----------+----------+--------------+ POP      Full           Yes      Yes                                 +---------+---------------+---------+-----------+----------+--------------+ PTV      Full                                                         +---------+---------------+---------+-----------+----------+--------------+ PERO     Full                                                        +---------+---------------+---------+-----------+----------+--------------+   +---------+---------------+---------+-----------+----------+--------------+ LEFT     CompressibilityPhasicitySpontaneityPropertiesThrombus Aging +---------+---------------+---------+-----------+----------+--------------+ CFV      Full           Yes      Yes                                 +---------+---------------+---------+-----------+----------+--------------+ SFJ      Full                                                        +---------+---------------+---------+-----------+----------+--------------+ FV Prox  Full                                                        +---------+---------------+---------+-----------+----------+--------------+ FV Mid   Full                                                        +---------+---------------+---------+-----------+----------+--------------+  FV DistalFull                                                        +---------+---------------+---------+-----------+----------+--------------+ PFV      Full                                                        +---------+---------------+---------+-----------+----------+--------------+ POP      Full           Yes      Yes                                 +---------+---------------+---------+-----------+----------+--------------+ PTV      Full                                                        +---------+---------------+---------+-----------+----------+--------------+ PERO     Full                                                        +---------+---------------+---------+-----------+----------+--------------+     Summary: BILATERAL: - No evidence of deep vein thrombosis seen in the lower  extremities, bilaterally. -No evidence of popliteal cyst, bilaterally.   *See table(s) above for measurements and observations. Electronically signed by Jamelle Haring on 06/24/2021 at 4:51:36 PM.    Final     Labs: BNP (last 3 results) No results for input(s): BNP in the last 8760 hours. Basic Metabolic Panel: Recent Labs  Lab 06/23/21 0908 06/23/21 0928 06/25/21 0547  NA 134* 138 136  K 3.8 3.8 3.5  CL 106 103 108  CO2 24  --  21*  GLUCOSE 126* 125* 113*  BUN 9 7 5*  CREATININE 0.81 0.80 0.83  CALCIUM 9.0  --  8.3*   Liver Function Tests: Recent Labs  Lab 06/23/21 0908  AST 19  ALT 23  ALKPHOS 39  BILITOT 0.4  PROT 7.7  ALBUMIN 3.8   No results for input(s): LIPASE, AMYLASE in the last 168 hours. No results for input(s): AMMONIA in the last 168 hours. CBC: Recent Labs  Lab 06/23/21 0900 06/23/21 0928 06/25/21 0547  WBC 4.9  --  4.1  NEUTROABS 2.8  --   --   HGB 12.4 13.6 10.5*  HCT 39.7 40.0 34.3*  MCV 79.4*  --  79.0*  PLT 270  --  230   Cardiac Enzymes: No results for input(s): CKTOTAL, CKMB, CKMBINDEX, TROPONINI in the last 168 hours. BNP: Invalid input(s): POCBNP CBG: Recent Labs  Lab 06/24/21 0116  GLUCAP 122*   D-Dimer No results for input(s): DDIMER in the last 72 hours. Hgb A1c Recent Labs    06/24/21 0314  HGBA1C 6.3*   Lipid Profile Recent Labs    06/24/21 0314  CHOL  179  HDL 61  LDLCALC 95  TRIG 115  CHOLHDL 2.9   Thyroid function studies Recent Labs    06/24/21 1323  TSH 1.964   Anemia work up Recent Labs    06/24/21 1323  VITAMINB12 587   Urinalysis    Component Value Date/Time   COLORURINE YELLOW 06/23/2021 [place] 06/23/2021 0934   LABSPEC 1.010 06/23/2021 0934   PHURINE 5.0 06/23/2021 0934   GLUCOSEU NEGATIVE 06/23/2021 0934   HGBUR NEGATIVE 06/23/2021 0934   BILIRUBINUR NEGATIVE 06/23/2021 0934   KETONESUR NEGATIVE 06/23/2021 0934   PROTEINUR NEGATIVE 06/23/2021 0934   UROBILINOGEN  0.2 01/05/2021 1821   NITRITE NEGATIVE 06/23/2021 0934   LEUKOCYTESUR NEGATIVE 06/23/2021 0934   Sepsis Labs Invalid input(s): PROCALCITONIN,  WBC,  LACTICIDVEN Microbiology Recent Results (from the past 240 hour(s))  Resp Panel by RT-PCR (Flu A&B, Covid) Nasopharyngeal Swab     Status: None   Collection Time: 06/23/21  9:34 AM   Specimen: Nasopharyngeal Swab; Nasopharyngeal(NP) swabs in vial transport medium  Result Value Ref Range Status   SARS Coronavirus 2 by RT PCR NEGATIVE NEGATIVE Final    Comment: (NOTE) SARS-CoV-2 target nucleic acids are NOT DETECTED.  The SARS-CoV-2 RNA is generally detectable in upper respiratory specimens during the acute phase of infection. The lowest concentration of SARS-CoV-2 viral copies this assay can detect is 138 copies/mL. A negative result does not preclude SARS-Cov-2 infection and should not be used as the sole basis for treatment or other patient management decisions. A negative result may occur with  improper specimen collection/handling, submission of specimen other than nasopharyngeal swab, presence of viral mutation(s) within the areas targeted by this assay, and inadequate number of viral copies(<138 copies/mL). A negative result must be combined with clinical observations, patient history, and epidemiological information. The expected result is Negative.  Fact Sheet for Patients:  EntrepreneurPulse.com.au  Fact Sheet for Healthcare Providers:  IncredibleEmployment.be  This test is no t yet approved or cleared by the [place] FDA and  has been authorized for detection and/or diagnosis of SARS-CoV-2 by FDA under an Emergency Use Authorization (EUA). This EUA will remain  in effect (meaning this test can be used) for the duration of the COVID-19 declaration under Section 564(b)(1) of the Act, 21 U.S.C.section 360bbb-3(b)(1), unless the authorization is terminated  or revoked sooner.        Influenza A by PCR NEGATIVE NEGATIVE Final   Influenza B by PCR NEGATIVE NEGATIVE Final    Comment: (NOTE) The Xpert Xpress SARS-CoV-2/FLU/RSV plus assay is intended as an aid in the diagnosis of influenza from Nasopharyngeal swab specimens and should not be used as a sole basis for treatment. Nasal washings and aspirates are unacceptable for Xpert Xpress SARS-CoV-2/FLU/RSV testing.  Fact Sheet for Patients: EntrepreneurPulse.com.au  Fact Sheet for Healthcare Providers: IncredibleEmployment.be  This test is not yet approved or cleared by the [place] FDA and has been authorized for detection and/or diagnosis of SARS-CoV-2 by FDA under an Emergency Use Authorization (EUA). This EUA will remain in effect (meaning this test can be used) for the duration of the COVID-19 declaration under Section 564(b)(1) of the Act, 21 U.S.C. section 360bbb-3(b)(1), unless the authorization is terminated or revoked.  Performed at [facility], [place] [address] [place], [place] [address]      Time coordinating discharge: 25 minutes  SIGNED: Antonieta Pert, MD  [facility] 06/25/2021, 11:46 AM  If 7PM-7AM, please contact night-coverage [email or web address]   25

## 2021-06-25 NOTE — Plan of Care (Signed)
  Problem: Education: Goal: Knowledge of General Education information will improve Description: Including pain rating scale, medication(s)/side effects and non-pharmacologic comfort measures 06/25/2021 1347 by Lysbeth Galas, RN Outcome: Adequate for Discharge 06/25/2021 1346 by Lysbeth Galas, RN Outcome: Adequate for Discharge   Problem: Health Behavior/Discharge Planning: Goal: Ability to manage health-related needs will improve 06/25/2021 1347 by Lysbeth Galas, RN Outcome: Adequate for Discharge 06/25/2021 1346 by Lysbeth Galas, RN Outcome: Adequate for Discharge   Problem: Clinical Measurements: Goal: Ability to maintain clinical measurements within normal limits will improve 06/25/2021 1347 by Lysbeth Galas, RN Outcome: Adequate for Discharge 06/25/2021 1346 by Lysbeth Galas, RN Outcome: Adequate for Discharge Goal: Will remain free from infection 06/25/2021 1347 by Lysbeth Galas, RN Outcome: Adequate for Discharge 06/25/2021 1346 by Lysbeth Galas, RN Outcome: Adequate for Discharge Goal: Diagnostic test results will improve 06/25/2021 1347 by Lysbeth Galas, RN Outcome: Adequate for Discharge 06/25/2021 1346 by Lysbeth Galas, RN Outcome: Adequate for Discharge Goal: Respiratory complications will improve 06/25/2021 1347 by Lysbeth Galas, RN Outcome: Adequate for Discharge 06/25/2021 1346 by Lysbeth Galas, RN Outcome: Adequate for Discharge Goal: Cardiovascular complication will be avoided 06/25/2021 1347 by Lysbeth Galas, RN Outcome: Adequate for Discharge 06/25/2021 1346 by Lysbeth Galas, RN Outcome: Adequate for Discharge   Problem: Activity: Goal: Risk for activity intolerance will decrease 06/25/2021 1347 by Lysbeth Galas, RN Outcome: Adequate for Discharge 06/25/2021 1346 by Lysbeth Galas, RN Outcome: Adequate for Discharge   Problem: Nutrition: Goal: Adequate nutrition will be maintained 06/25/2021 1347 by Lysbeth Galas,  RN Outcome: Adequate for Discharge 06/25/2021 1346 by Lysbeth Galas, RN Outcome: Adequate for Discharge   Problem: Coping: Goal: Level of anxiety will decrease 06/25/2021 1347 by Lysbeth Galas, RN Outcome: Adequate for Discharge 06/25/2021 1346 by Lysbeth Galas, RN Outcome: Adequate for Discharge   Problem: Elimination: Goal: Will not experience complications related to bowel motility 06/25/2021 1347 by Lysbeth Galas, RN Outcome: Adequate for Discharge 06/25/2021 1346 by Lysbeth Galas, RN Outcome: Adequate for Discharge Goal: Will not experience complications related to urinary retention 06/25/2021 1347 by Lysbeth Galas, RN Outcome: Adequate for Discharge 06/25/2021 1346 by Lysbeth Galas, RN Outcome: Adequate for Discharge   Problem: Pain Managment: Goal: General experience of comfort will improve 06/25/2021 1347 by Lysbeth Galas, RN Outcome: Adequate for Discharge 06/25/2021 1346 by Lysbeth Galas, RN Outcome: Adequate for Discharge   Problem: Safety: Goal: Ability to remain free from injury will improve 06/25/2021 1347 by Lysbeth Galas, RN Outcome: Adequate for Discharge 06/25/2021 1346 by Lysbeth Galas, RN Outcome: Adequate for Discharge   Problem: Skin Integrity: Goal: Risk for impaired skin integrity will decrease 06/25/2021 1347 by Lysbeth Galas, RN Outcome: Adequate for Discharge 06/25/2021 1346 by Lysbeth Galas, RN Outcome: Adequate for Discharge   Problem: Education: Goal: Knowledge of disease or condition will improve 06/25/2021 1347 by Lysbeth Galas, RN Outcome: Adequate for Discharge 06/25/2021 1346 by Lysbeth Galas, RN Outcome: Adequate for Discharge

## 2021-06-25 NOTE — Evaluation (Signed)
Speech Language Pathology Evaluation Patient Details Name: PENNIE VANBLARCOM MRN: [id number] DOB: 1976/08/19 Today's Date: 06/25/2021 Time: 9024-0973 SLP Time Calculation (min) (ACUTE ONLY): 21 min  Problem List:  Patient Active Problem List   Diagnosis Date Noted   CVA (cerebral vascular accident) ([place]) 06/23/2021   COVID-19 virus infection 03/08/2021   Menorrhagia with irregular cycle 10/27/2020   Flu-like symptoms 09/30/2018   Pain of right thumb 06/04/2018   Concussion with loss of consciousness 09/24/2015   Chronic low back pain 06/18/2012   Past Medical History:  Past Medical History:  Diagnosis Date   Anemia    Chronic headaches    Dysmenorrhea    Heart murmur    dx'd since childhood   Past Surgical History:  Past Surgical History:  Procedure Laterality Date   TUBAL LIGATION     HPI:  45 yo female admitted 10/27 with RUE weakness. MRI showed right insular cortex small diffusion abnormalities however neurology await 2nd MRI as this does not explain RUE weakness. 10/28 at 1am pt also with LUE weakness. 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 PMhx: anemia   Assessment / Plan / Recommendation Clinical Impression  Portion of Western Aphasia Battery administed to patient (x writing due to IV) with pt scoring perfectly on all items tested - no indicating aphasia.  She did demonstrate mild slowness in repetition of lengthier sentence but no errors observed.  No focal CN deficits noted.  Memory subtest completed revealed perfect recall of 5 words within 5 minutes.  Pt reports she feels her speech is still mildly "slow" - suspect this is due to bilateral frontal CVA and it will likely resolve.  No follow up SLP indicated. Reviewed testing with pt.  Thanks for this consult.    SLP Assessment  SLP Visit Diagnosis: Cognitive communication deficit (R41.841)    Recommendations  for follow up therapy are one component of a multi-disciplinary discharge planning process, led by the attending physician.  Recommendations may be updated based on patient status, additional functional criteria and insurance authorization.    Follow Up Recommendations  None    Frequency and Duration           SLP Evaluation Cognition  Overall Cognitive Status: Within Functional Limits for tasks assessed Arousal/Alertness: Awake/alert Orientation Level: Oriented X4 Year: 2022 Month: October Day of Week: Correct Attention: Sustained Sustained Attention: Appears intact Memory: Appears intact (named 5 of 5 items correctly on SLUMS)       Comprehension  Auditory Comprehension Overall Auditory Comprehension: Appears within functional limits for tasks assessed Yes/No Questions: Within Functional Limits Commands: Within Functional Limits Multistep Basic Commands: Other (comment) (100%) Complex Commands: Other (comment) (100%) Conversation: Complex Visual Recognition/Discrimination Discrimination: Not tested Reading Comprehension Reading Status: Not tested    Expression Expression Primary Mode of Expression: Verbal Verbal Expression Overall Verbal Expression: Appears within functional limits for tasks assessed Initiation: No impairment Repetition:  (DNT) Naming: No impairment Non-Verbal Means of Communication: Not applicable Other Verbal Expression Comments: Pt's speech was fluent during the session, describing picture with adequate semantics and syntax. 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

## 2021-06-25 NOTE — Progress Notes (Signed)
STROKE TEAM PROGRESS NOTE   SUBJECTIVE (INTERVAL HISTORY) No family at bedside, patient sitting in bed, no acute neuro changes overnight.  Still has left hand weakness but improving.  She told me that she started phentermine for weight loss 10 days ago.  Although she was on phentermine on and off in the past, but last 10 days she continued daily.  She also on aspirin/progesterone birth control pills for the last 8 months.  This two combination may contribute for her stroke.  Other stroke work-up so far negative.   OBJECTIVE Temp:  [98.2 F (36.8 C)-98.9 F (37.2 C)] 98.2 F (36.8 C) (10/29 0755) Pulse Rate:  [60-80] 75 (10/29 0755) Cardiac Rhythm: Normal sinus rhythm (10/29 0700) Resp:  [18] 18 (10/29 0649) BP: (126-149)/(66-86) 126/66 (10/29 0755) SpO2:  [98 %-100 %] 100 % (10/29 0755)  Recent Labs  Lab 06/24/21 0116  GLUCAP 122*   Recent Labs  Lab 06/23/21 0908 06/23/21 0928 06/25/21 0547  NA 134* 138 136  K 3.8 3.8 3.5  CL 106 103 108  CO2 24  --  21*  GLUCOSE 126* 125* 113*  BUN 9 7 5*  CREATININE 0.81 0.80 0.83  CALCIUM 9.0  --  8.3*   Recent Labs  Lab 06/23/21 0908  AST 19  ALT 23  ALKPHOS 39  BILITOT 0.4  PROT 7.7  ALBUMIN 3.8   Recent Labs  Lab 06/23/21 0900 06/23/21 0928 06/25/21 0547  WBC 4.9  --  4.1  NEUTROABS 2.8  --   --   HGB 12.4 13.6 10.5*  HCT 39.7 40.0 34.3*  MCV 79.4*  --  79.0*  PLT 270  --  230   No results for input(s): CKTOTAL, CKMB, CKMBINDEX, TROPONINI in the last 168 hours. Recent Labs    06/23/21 0908  LABPROT 12.7  INR 1.0   Recent Labs    06/23/21 0934  COLORURINE YELLOW  LABSPEC 1.010  PHURINE 5.0  GLUCOSEU NEGATIVE  HGBUR NEGATIVE  BILIRUBINUR NEGATIVE  KETONESUR NEGATIVE  PROTEINUR NEGATIVE  NITRITE NEGATIVE  LEUKOCYTESUR NEGATIVE       Component Value Date/Time   CHOL 179 06/24/2021 0314   TRIG 115 06/24/2021 0314   HDL 61 06/24/2021 0314   CHOLHDL 2.9 06/24/2021 0314   VLDL 23 06/24/2021 0314    LDLCALC 95 06/24/2021 0314   Lab Results  Component Value Date   HGBA1C 6.3 (H) 06/24/2021      Component Value Date/Time   LABOPIA NONE DETECTED 06/23/2021 0934   COCAINSCRNUR NONE DETECTED 06/23/2021 0934   LABBENZ NONE DETECTED 06/23/2021 0934   AMPHETMU NONE DETECTED 06/23/2021 0934   THCU NONE DETECTED 06/23/2021 0934   LABBARB NONE DETECTED 06/23/2021 0934    Recent Labs  Lab 06/23/21 1920  ETH <10    I have personally reviewed the radiological images below and agree with the radiology interpretations.  CT Angio Head W or Wo Contrast  Result Date: 06/23/2021 CLINICAL DATA:  Neuro deficit, acute, stroke suspected; right arm weakness, acute stroke on MRI EXAM: CT ANGIOGRAPHY HEAD AND NECK TECHNIQUE: Multidetector CT imaging of the head and neck was performed using the standard protocol during bolus administration of intravenous contrast. Multiplanar CT image reconstructions and MIPs were obtained to evaluate the vascular anatomy. Carotid stenosis measurements (when applicable) are obtained utilizing NASCET criteria, using the distal internal carotid diameter as the denominator. CONTRAST:  20m OMNIPAQUE IOHEXOL 350 MG/ML SOLN COMPARISON:  Correlation made with recent MR imaging FINDINGS: CTA NECK Aortic  arch: Great vessel origins are patent. Right carotid system: Patent.  No stenosis. Left carotid system: Patent.  No stenosis. Vertebral arteries: Patent. Left vertebral artery is dominant. No stenosis. Skeleton: Facet predominant cervical spine degenerative changes. Other neck: Unremarkable. Upper chest: There is a 2 mm nodule of the right upper lobe (series 5, image 12). Review of the MIP images confirms the above findings CTA HEAD Anterior circulation: Intracranial internal carotid arteries are patent. Anterior cerebral arteries are patent. Left A1 ACA is dominant. Anterior communicating artery is present. Middle cerebral arteries are patent. Probable stenosis or occlusion of small mid  right M2 MCA branch in the region of insular infarct on MRI. This corresponds to questioned abnormality on MRA. Posterior circulation: Intracranial vertebral arteries are patent. Basilar artery is patent. Major cerebellar artery origins are patent. Bilateral posterior communicating arteries are present. Posterior cerebral arteries are patent. Venous sinuses: Patent as allowed by contrast bolus timing. Review of the MIP images confirms the above findings IMPRESSION: No large vessel occlusion. No hemodynamically significant stenosis in the neck. Probable stenosis or occlusion of a small mid right M2 MCA branch in the region of insular infarct on MRI. This may correspond to abnormality seen on MRA. 2 mm right upper lobe nodule. No follow-up needed if patient is low-risk. Non-contrast chest CT can be considered in 12 months if patient is high-risk. This recommendation follows the consensus statement: Guidelines for Management of Incidental Pulmonary Nodules Detected on CT Images: From the Fleischner Society 2017; Radiology 2017; 284:228-243. Electronically Signed   By: Macy Mis M.D.   On: 06/23/2021 13:33   DG Chest 2 View  Result Date: 06/23/2021 CLINICAL DATA:  Chest pain EXAM: CHEST - 2 VIEW COMPARISON:  Chest x-ray 04/25/2020 FINDINGS: Heart size and mediastinal contours are within normal limits. No suspicious pulmonary opacities identified. No pleural effusion or pneumothorax visualized. No acute osseous abnormality appreciated. IMPRESSION: No acute intrathoracic process identified. Electronically Signed   By: Ofilia Neas M.D.   On: 06/23/2021 12:16   CT HEAD WO CONTRAST (5MM)  Result Date: 06/24/2021 CLINICAL DATA:  Stroke follow-up. EXAM: CT HEAD WITHOUT CONTRAST TECHNIQUE: Contiguous axial images were obtained from the base of the skull through the vertex without intravenous contrast. COMPARISON:  Plain brain CT and MR head, dated June 23, 2021 FINDINGS: Brain: No evidence of acute  infarction, hemorrhage, hydrocephalus, extra-axial collection or mass lesion/mass effect. Vascular: No hyperdense vessel or unexpected calcification. Skull: Normal. Negative for fracture or focal lesion. Sinuses/Orbits: No acute finding. Other: None. IMPRESSION: 1. No acute intracranial pathology. 2. No CT abnormality to correspond to the acute infarct within the right corona radiata seen on the prior MR head. Correlation with follow-up MRI is recommended. Electronically Signed   By: Virgina Norfolk M.D.   On: 06/24/2021 01:58   CT Angio Neck W and/or Wo Contrast  Result Date: 06/23/2021 CLINICAL DATA:  Neuro deficit, acute, stroke suspected; right arm weakness, acute stroke on MRI EXAM: CT ANGIOGRAPHY HEAD AND NECK TECHNIQUE: Multidetector CT imaging of the head and neck was performed using the standard protocol during bolus administration of intravenous contrast. Multiplanar CT image reconstructions and MIPs were obtained to evaluate the vascular anatomy. Carotid stenosis measurements (when applicable) are obtained utilizing NASCET criteria, using the distal internal carotid diameter as the denominator. CONTRAST:  55m OMNIPAQUE IOHEXOL 350 MG/ML SOLN COMPARISON:  Correlation made with recent MR imaging FINDINGS: CTA NECK Aortic arch: Great vessel origins are patent. Right carotid system: Patent.  No stenosis.  Left carotid system: Patent.  No stenosis. Vertebral arteries: Patent. Left vertebral artery is dominant. No stenosis. Skeleton: Facet predominant cervical spine degenerative changes. Other neck: Unremarkable. Upper chest: There is a 2 mm nodule of the right upper lobe (series 5, image 12). Review of the MIP images confirms the above findings CTA HEAD Anterior circulation: Intracranial internal carotid arteries are patent. Anterior cerebral arteries are patent. Left A1 ACA is dominant. Anterior communicating artery is present. Middle cerebral arteries are patent. Probable stenosis or occlusion of  small mid right M2 MCA branch in the region of insular infarct on MRI. This corresponds to questioned abnormality on MRA. Posterior circulation: Intracranial vertebral arteries are patent. Basilar artery is patent. Major cerebellar artery origins are patent. Bilateral posterior communicating arteries are present. Posterior cerebral arteries are patent. Venous sinuses: Patent as allowed by contrast bolus timing. Review of the MIP images confirms the above findings IMPRESSION: No large vessel occlusion. No hemodynamically significant stenosis in the neck. Probable stenosis or occlusion of a small mid right M2 MCA branch in the region of insular infarct on MRI. This may correspond to abnormality seen on MRA. 2 mm right upper lobe nodule. No follow-up needed if patient is low-risk. Non-contrast chest CT can be considered in 12 months if patient is high-risk. This recommendation follows the consensus statement: Guidelines for Management of Incidental Pulmonary Nodules Detected on CT Images: From the Fleischner Society 2017; Radiology 2017; 284:228-243. Electronically Signed   By: Macy Mis M.D.   On: 06/23/2021 13:33   MR ANGIO HEAD WO CONTRAST  Result Date: 06/23/2021 CLINICAL DATA:  Stroke, follow-up. Additional history provided: Lightheadedness, right arm weakness, chest pain, headache. EXAM: MRI HEAD WITHOUT CONTRAST MRA HEAD WITHOUT CONTRAST TECHNIQUE: Multiplanar, multi-echo pulse sequences of the brain and surrounding structures were acquired without intravenous contrast. Angiographic images of the Circle of Willis were acquired using MRA technique without intravenous contrast. COMPARISON:  Head CT 06/23/2021. FINDINGS: MRI HEAD FINDINGS Brain: Mild intermittent motion degradation. Cerebral volume is normal. 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 Additional 2 mm acute cortical infarct within the high posterior right frontal lobe (series 9,  image 99). A subtle punctate acute infarct is also questioned within the high posterior left frontal lobe (versus artifact) (for instance as seen on series 9, image 98) (series 17, image 57). No evidence of an intracranial mass. No chronic intracranial blood products. No extra-axial fluid collection. No midline shift. Vascular: T2 FLAIR hyperintense signal abnormality within right M2 MCA vessels within the right sylvian fissure, which may reflect thrombus or slow flow within these vessels. Skull and upper cervical spine: Flow voids otherwise maintained within the proximal large arterial vessels. Sinuses/Orbits: Visualized orbits show no acute finding. No significant paranasal sinus disease. MRA HEAD FINDINGS Moderately motion degraded exam. Anterior circulation: The intracranial internal carotid arteries are patent. The M1 middle cerebral arteries are patent. 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 No left M2 proximal branch occlusion or severe proximal stenosis is identified. The anterior cerebral arteries are patent. 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 Within the limitations of motion degradation, no intracranial aneurysm is identified. Posterior circulation: The intracranial vertebral arteries are patent. Dominant left vertebral artery. The basilar artery is patent. The posterior cerebral arteries are patent. Fetal origin right posterior cerebral artery. A left posterior communicating artery is also present. Anatomic  variants: As described. MRI brain impressions #2 and #3 and MRA head impression #2 called by telephone at the time of interpretation on 06/23/2021 at 12:36 pm to provider Dr. Armandina Gemma, who verbally acknowledged these results. IMPRESSION: MRI brain: 1. Mildly motion degraded exam. 2. Acute infarct within the right corona radiata/basal ganglia and right  insula measuring 3.0 x 1.0 cm. 3. Additional 2 mm acute cortical infarct within the high posterior right frontal lobe (also within the right MCA vascular territory). 4. Subtle punctate acute infarct (versus artifact) within the high posterior left frontal lobe. MRA head: 1. Moderately motion degraded exam. 2. 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 3. 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 Electronically Signed   By: Kellie Simmering D.O.   On: 06/23/2021 12:45   MR ANGIO NECK WO CONTRAST  Result Date: 06/23/2021 CLINICAL DATA:  Stroke, follow-up. Additional history provided: Lightheadedness, right arm weakness, chest pain and headache. EXAM: MRA NECK WITHOUT CONTRAST TECHNIQUE: Angiographic images of the neck were acquired using MRA technique without intravenous contrast. Carotid stenosis measurements (when applicable) are obtained utilizing NASCET criteria, using the distal internal carotid diameter as the denominator. COMPARISON:  CT angiogram chest 09/28/2011. Concurrently performed MRI brain and MRA head 06/23/2021. FINDINGS: Motion degradation at the level of the lower neck/upper chest. Standard aortic branching. The common carotid and internal carotid arteries are patent within the neck without hemodynamically significant stenosis (50% or greater). The vertebral arteries are patent within the neck. The left vertebral artery is dominant. Motion degradation precludes evaluation for stenosis at the origin of the non-dominant right vertebral artery. No appreciable hemodynamically significant stenosis elsewhere within the cervical vertebral arteries. IMPRESSION: The common carotid and internal carotid arteries are patent within the neck without hemodynamically significant stenosis. The vertebral arteries are patent within the neck. Motion degradation precludes evaluation for stenosis at the origin of the non-dominant right  vertebral artery. No appreciable hemodynamically significant stenosis elsewhere within the cervical vertebral arteries. Electronically Signed   By: Kellie Simmering D.O.   On: 06/23/2021 12:53   MR BRAIN WO CONTRAST  Result Date: 06/23/2021 CLINICAL DATA:  Stroke, follow-up. Additional history provided: Lightheadedness, right arm weakness, chest pain, headache. EXAM: MRI HEAD WITHOUT CONTRAST MRA HEAD WITHOUT CONTRAST TECHNIQUE: Multiplanar, multi-echo pulse sequences of the brain and surrounding structures were acquired without intravenous contrast. Angiographic images of the Circle of Willis were acquired using MRA technique without intravenous contrast. COMPARISON:  Head CT 06/23/2021. FINDINGS: MRI HEAD FINDINGS Brain: Mild intermittent motion degradation. Cerebral volume is normal. 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 Additional 2 mm acute cortical infarct within the high posterior right frontal lobe (series 9, image 99). A subtle punctate acute infarct is also questioned within the high posterior left frontal lobe (versus artifact) (for instance as seen on series 9, image 98) (series 17, image 57). No evidence of an intracranial mass. No chronic intracranial blood products. No extra-axial fluid collection. No midline shift. Vascular: T2 FLAIR hyperintense signal abnormality within right M2 MCA vessels within the right sylvian fissure, which may reflect thrombus or slow flow within these vessels. Skull and upper cervical spine: Flow voids otherwise maintained within the proximal large arterial vessels. Sinuses/Orbits: Visualized orbits show no acute finding. No significant paranasal sinus disease. MRA HEAD FINDINGS Moderately motion degraded exam. Anterior circulation: The intracranial internal carotid arteries are patent. The M1 middle cerebral arteries are patent.  Occlusion versus severe, near-occlusive stenosis of a proximal-to-mid right M2 MCA  vessel (series 23, image 70) (series 102, image 17). No left M2 proximal branch occlusion or severe proximal stenosis is identified. The anterior cerebral arteries are patent. 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 Within the limitations of motion degradation, no intracranial aneurysm is identified. Posterior circulation: The intracranial vertebral arteries are patent. Dominant left vertebral artery. The basilar artery is patent. The posterior cerebral arteries are patent. Fetal origin right posterior cerebral artery. A left posterior communicating artery is also present. Anatomic variants: As described. MRI brain impressions #2 and #3 and MRA head impression #2 called by telephone at the time of interpretation on 06/23/2021 at 12:36 pm to provider Dr. Armandina Gemma, who verbally acknowledged these results. IMPRESSION: MRI brain: 1. Mildly motion degraded exam. 2. 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 3. Additional 2 mm acute cortical infarct within the high posterior right frontal lobe (also within the right MCA vascular territory). 4. Subtle punctate acute infarct (versus artifact) within the high posterior left frontal lobe. MRA head: 1. Moderately motion degraded exam. 2. 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 3. 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 Electronically Signed   By: Kellie Simmering D.O.   On: 06/23/2021 12:45   MR BRAIN W WO CONTRAST  Result Date: 06/24/2021 CLINICAL DATA:  Stroke, follow-up. EXAM: MRI HEAD WITHOUT AND WITH CONTRAST TECHNIQUE: Multiplanar, multiecho pulse sequences of the brain and surrounding structures were obtained without and with intravenous contrast. CONTRAST:  8.27m GADAVIST GADOBUTROL 1 MMOL/ML IV SOLN COMPARISON:  CT head without contrast 06/24/2021. MR head without contrast  06/23/2021 FINDINGS: Brain: The acute infarct involving the right insular cortex and external capsule is stable. Additional restricted diffusion is now noted along the precentral gyrus on the right. This may correspond to additional left-sided weakness. The punctate focus in the right middle frontal gyrus is again seen. Punctate focus in the left superior frontal gyrus continues to fade. Additional punctate foci are noted in the high anterior right parietal lobe. T2 signal changes are associated with the areas of acute infarction. No acute hemorrhage or mass lesion is present. Minimal white matter changes are present otherwise. The internal auditory canals are within normal limits. The brainstem and cerebellum are within normal limits. The ventricles are of normal size. No significant extraaxial fluid collection is present. Vascular: Flow is present in the major intracranial arteries. Skull and upper cervical spine: The craniocervical junction is normal. Upper cervical spine is within normal limits. Marrow signal is unremarkable. Sinuses/Orbits: The paranasal sinuses and mastoid air cells are clear. The globes and orbits are within normal limits. IMPRESSION: 1. Stable appearance of acute/subacute nonhemorrhagic infarct involving the right insular cortex and external capsule. Stable appearance of acute/subacute nonhemorrhagic infarcts in the anterior frontal lobes bilaterally. 2. Additional punctate foci of acute nonhemorrhagic infarction involving the right precentral gyrus and right parietal lobe. Question ongoing embolic source of these infarctions. Electronically Signed   By: CSan MorelleM.D.   On: 06/24/2021 12:52   VAS [place] DOPPL[place] W BUBBLES  Result Date: 06/24/2021  Transcranial Doppler with Bubble Patient Name:  NILEY DEIGNAN Date of Exam:   06/24/2021 Medical Rec #: [id number]     Accession #:    [id number] of Birth: 110/17/1977     Patient Gender: F Patient Age:  45 years Exam  Location:  [facility] Procedure:      VAS [place] TRANSCRANIAL DOPPLER W BUBBLES Referring Phys: Cornelius Moras Kama Cammarano --------------------------------------------------------------------------------  Indications: Stroke. Performing Technologist: Archie Patten RVS  Examination Guidelines: A complete evaluation includes B-mode imaging, spectral Doppler, color Doppler, and power Doppler as needed of all accessible portions of each vessel. Bilateral testing is considered an integral part of a complete examination. Limited examinations for reoccurring indications may be performed as noted.  Summary: No HITS at rest or during Valsalva. Negative transcranial Doppler Bubble study with no evidence of right to left intracardiac communication.  A vascular evaluation was performed. The right middle cerebral artery was studied. An IV was inserted into the patient's left forearm. Verbal informed consent was obtained.  *See table(s) above for TCD measurements and observations.  Diagnosing physician: Jamelle Haring Electronically signed by Jamelle Haring on 06/24/2021 at 5:02:15 PM.    Final    ECHOCARDIOGRAM COMPLETE  Result Date: 06/24/2021    ECHOCARDIOGRAM REPORT   Patient Name:   YASHEKA FOSSETT Date of Exam: 06/24/2021 Medical Rec #:  [id number]     Height:       66.0 in Accession #:    [id number]    Weight:       198.0 lb Date of Birth:  [birth year]     BSA:          1.991 m Patient Age:    51 years      BP:           147/74 mmHg Patient Gender: F             HR:           79 bpm. Exam Location:  Inpatient Procedure: 2D Echo, Cardiac Doppler and Color Doppler Indications:    Stroke  History:        Patient has no prior history of Echocardiogram examinations.  Sonographer:    Jyl Heinz Referring Phys: [id number] [place]  1. Left ventricular ejection fraction, by estimation, is 60 to 65%. The left ventricle has normal function. The left ventricle has no regional wall motion abnormalities. Left ventricular  diastolic parameters were normal.  2. Right ventricular systolic function is normal. The right ventricular size is normal.  3. The mitral valve is grossly normal. Trivial mitral valve regurgitation.  4. The aortic valve is normal in structure. Aortic valve regurgitation is not visualized. No aortic stenosis is present. FINDINGS  Left Ventricle: Left ventricular ejection fraction, by estimation, is 60 to 65%. The left ventricle has normal function. The left ventricle has no regional wall motion abnormalities. The left ventricular internal cavity size was normal in size. There is  no left ventricular hypertrophy. Left ventricular diastolic parameters were normal. Right Ventricle: The right ventricular size is normal. Right vetricular wall thickness was not well visualized. Right ventricular systolic function is normal. Left Atrium: Left atrial size was normal in size. Right Atrium: Right atrial size was normal in size. Pericardium: There is no evidence of pericardial effusion. Mitral Valve: The mitral valve is grossly normal. Trivial mitral valve regurgitation. Tricuspid Valve: The tricuspid valve is grossly normal. Tricuspid valve regurgitation is trivial. Aortic Valve: The aortic valve is normal in structure. Aortic valve regurgitation is not visualized. No aortic stenosis is present. Aortic valve peak gradient measures 8.1 mmHg. Pulmonic Valve: The pulmonic valve was not well visualized. Pulmonic valve regurgitation is trivial. Aorta: The aortic root and ascending aorta are structurally normal, with  no evidence of dilitation. IAS/Shunts: The interatrial septum was not well visualized.  LEFT VENTRICLE PLAX 2D LVIDd:         5.10 cm      Diastology LVIDs:         3.80 cm      LV e' medial:    7.29 cm/s LV PW:         1.00 cm      LV E/e' medial:  7.4 LV IVS:        1.00 cm      LV e' lateral:   7.40 cm/s LVOT diam:     2.00 cm      LV E/e' lateral: 7.3 LV SV:         57 LV SV Index:   29 LVOT Area:     3.14 cm  LV  Volumes (MOD) LV vol d, MOD A2C: 112.0 ml LV vol d, MOD A4C: 138.0 ml LV vol s, MOD A2C: 53.6 ml LV vol s, MOD A4C: 66.9 ml LV SV MOD A2C:     58.4 ml LV SV MOD A4C:     138.0 ml LV SV MOD BP:      69.9 ml RIGHT VENTRICLE             IVC RV Basal diam:  3.90 cm     IVC diam: 2.00 cm RV Mid diam:    2.80 cm RV S prime:     12.00 cm/s TAPSE (M-mode): 2.1 cm LEFT ATRIUM           Index        RIGHT ATRIUM           Index LA diam:      3.70 cm 1.86 cm/m   RA Area:     13.70 cm LA Vol (A2C): 59.8 ml 30.03 ml/m  RA Volume:   35.20 ml  17.68 ml/m LA Vol (A4C): 28.8 ml 14.46 ml/m  AORTIC VALVE AV Area (Vmax): 2.29 cm AV Vmax:        142.50 cm/s AV Peak Grad:   8.1 mmHg LVOT Vmax:      103.65 cm/s LVOT Vmean:     72.200 cm/s LVOT VTI:       0.182 m  AORTA Ao Root diam: 3.00 cm Ao Asc diam:  2.80 cm MITRAL VALVE MV Area (PHT): 4.80 cm    SHUNTS MV Decel Time: 158 msec    Systemic VTI:  0.18 m MV E velocity: 54.30 cm/s  Systemic Diam: 2.00 cm MV A velocity: 65.00 cm/s MV E/A ratio:  0.84 Mertie Moores MD Electronically signed by Mertie Moores MD Signature Date/Time: 06/24/2021/5:21:05 PM    Final    CT HEAD CODE STROKE WO CONTRAST  Result Date: 06/23/2021 CLINICAL DATA:  Code stroke. Near syncope yesterday. Right arm numbness today. EXAM: CT HEAD WITHOUT CONTRAST TECHNIQUE: Contiguous axial images were obtained from the base of the skull through the vertex without intravenous contrast. COMPARISON:  04/25/2020 FINDINGS: Brain: No evidence of acute infarction, hemorrhage, hydrocephalus, extra-axial collection or mass lesion/mass effect. Vascular: No hyperdense vessel or unexpected calcification. Skull: Normal. Negative for fracture or focal lesion. Sinuses/Orbits: No acute finding. Other: These results were communicated to Dr Quinn Axe at 9:34 am on 06/23/2021 by text page via the [facility] messaging system. Motion artifact words the vertex. ASPECTS [facility] Stroke Program Early CT Score) - Ganglionic level infarction  (caudate, lentiform nuclei, internal capsule, insula, M1-M3 cortex): 7 - Supraganglionic infarction (M4-M6  cortex): 3 Total score (0-10 with 10 being normal): 10 IMPRESSION: No acute finding. Electronically Signed   By: Jorje Guild M.D.   On: 06/23/2021 09:35   VAS [place] LOWER EXTREMITY VENOUS (DVT)  Result Date: 06/24/2021  Lower Venous DVT Study Patient Name:  AKSHITA ITALIANO  Date of Exam:   06/24/2021 Medical Rec #: [id number]      Accession #:    [id number] Date of Birth: 05-21-1976      Patient Gender: F Patient Age:   72 years Exam Location:  [facility] Procedure:      VAS [place] LOWER EXTREMITY VENOUS (DVT) Referring Phys: Cornelius Moras Annalena Piatt --------------------------------------------------------------------------------  Indications: Stroke.  Comparison Study: no prior Performing Technologist: Archie Patten RVS  Examination Guidelines: A complete evaluation includes B-mode imaging, spectral Doppler, color Doppler, and power Doppler as needed of all accessible portions of each vessel. Bilateral testing is considered an integral part of a complete examination. Limited examinations for reoccurring indications may be performed as noted. The reflux portion of the exam is performed with the patient in reverse Trendelenburg.  +---------+---------------+---------+-----------+----------+--------------+ RIGHT    CompressibilityPhasicitySpontaneityPropertiesThrombus Aging +---------+---------------+---------+-----------+----------+--------------+ CFV      Full           Yes      Yes                                 +---------+---------------+---------+-----------+----------+--------------+ SFJ      Full                                                        +---------+---------------+---------+-----------+----------+--------------+ FV Prox  Full                                                        +---------+---------------+---------+-----------+----------+--------------+ FV Mid   Full                                                         +---------+---------------+---------+-----------+----------+--------------+ FV DistalFull                                                        +---------+---------------+---------+-----------+----------+--------------+ PFV      Full                                                        +---------+---------------+---------+-----------+----------+--------------+ POP      Full           Yes      Yes                                 +---------+---------------+---------+-----------+----------+--------------+  PTV      Full                                                        +---------+---------------+---------+-----------+----------+--------------+ PERO     Full                                                        +---------+---------------+---------+-----------+----------+--------------+   +---------+---------------+---------+-----------+----------+--------------+ LEFT     CompressibilityPhasicitySpontaneityPropertiesThrombus Aging +---------+---------------+---------+-----------+----------+--------------+ CFV      Full           Yes      Yes                                 +---------+---------------+---------+-----------+----------+--------------+ SFJ      Full                                                        +---------+---------------+---------+-----------+----------+--------------+ FV Prox  Full                                                        +---------+---------------+---------+-----------+----------+--------------+ FV Mid   Full                                                        +---------+---------------+---------+-----------+----------+--------------+ FV DistalFull                                                        +---------+---------------+---------+-----------+----------+--------------+ PFV      Full                                                         +---------+---------------+---------+-----------+----------+--------------+ POP      Full           Yes      Yes                                 +---------+---------------+---------+-----------+----------+--------------+ PTV      Full                                                        +---------+---------------+---------+-----------+----------+--------------+  PERO     Full                                                        +---------+---------------+---------+-----------+----------+--------------+     Summary: BILATERAL: - No evidence of deep vein thrombosis seen in the lower extremities, bilaterally. -No evidence of popliteal cyst, bilaterally.   *See table(s) above for measurements and observations. Electronically signed by Jamelle Haring on 06/24/2021 at 4:51:36 PM.    Final      PHYSICAL EXAM  Temp:  [98.2 F (36.8 C)-98.9 F (37.2 C)] 98.2 F (36.8 C) (10/29 0755) Pulse Rate:  [60-80] 75 (10/29 0755) Resp:  [18] 18 (10/29 0649) BP: (126-149)/(66-86) 126/66 (10/29 0755) SpO2:  [98 %-100 %] 100 % (10/29 0755)  General - Well nourished, well developed, in no apparent distress.  Ophthalmologic - fundi not visualized due to noncooperation.  Cardiovascular - Regular rhythm and rate.  Mental Status -  Level of arousal and orientation to time, place, and person were intact. Language including expression, naming, repetition, comprehension was assessed and found intact. Fund of Knowledge was assessed and was intact.  Cranial Nerves II - XII - II - Visual field intact OU. III, IV, VI - Extraocular movements intact. V - Facial sensation intact bilaterally. VII - Facial movement intact bilaterally. VIII - Hearing & vestibular intact bilaterally. X - Palate elevates symmetrically. XI - Chin turning & shoulder shrug intact bilaterally. XII - Tongue protrusion intact.  Motor Strength - The patient's strength was normal in all extremities and except left  hand decreased grip and hand dexterity, but mildly improved from yesterday.  Bulk was normal and fasciculations were absent.   Motor Tone - Muscle tone was assessed at the neck and appendages and was normal.  Reflexes - The patient's reflexes were symmetrical in all extremities and she had no pathological reflexes.  Sensory - Light touch, temperature/pinprick were assessed and were symmetrical.    Coordination - The patient had normal movements in the hands with no ataxia or dysmetria.  Tremor was absent.  Gait and Station - deferred.   ASSESSMENT/PLAN Ms. TAMEAKA EICHHORN is a 45 y.o. left-handed female with history of anemia, migraine and on birth control pills and phentermine for weight loss admitted for transient right-sided weakness, and later developed left hand weakness during admission. No tPA given due to NIHSS = 0 in ED.    Stroke:  right insular cortex, paracentral gyrus and frontal scattered small infarcts, embolic pattern secondary to unclear source.  However, given her recent use of phentermine for weight loss (last 10 days ) and on birth control pills (8 months), the combination likely the cause of her stroke. Resultant left hand weakness CT head x 2 no acute abnormality CT head and neck right distal M2/M3 occlusion versus high-grade stenosis corresponding to right insular cortex infarct MRI right insular cortex, paracentral gyrus and frontal scattered small infarcts.  Single left frontal tiny infarct. 2D Echo EF 60 to 65% LE venous Doppler negative for DVT TCD bubble study negative for PFO Recommend outpatient TEE to complete stroke work-up LDL 95 HgbA1c 6.3 TSH/B12/FT4/ESR/CRP/HIV/UDS all negative Hypercoagulable work-up pending SCDs for VTE prophylaxis No antithrombotic prior to admission, now on aspirin 81 mg daily and clopidogrel 75 mg daily for 3 weeks and then ASA alone.  Patient  counseled to be compliant with her antithrombotic medications Ongoing aggressive stroke  risk factor management Therapy recommendations:  outpt PT/OT, pt is left handed Disposition:  pending  Hypertension Stable Long term BP goal normotensive  Hyperlipidemia Home meds:  none  LDL 95, goal < 70 Now on Lipitor 40 Continue statin at discharge  Phentermine use for weight loss Patient stated she was on family in the past on and off, however for the last 10 days she was on continuous daily There has been case report about association between phentermine and ischemic stroke Recommend to stop phentermine use, patient is in agreement  Birth control pill use On Tri-Sprintec for the last 8 months Negative for DVT Has quit smoking for a year Recommend discussed with OB after discharge to avoid estrogen use.  May consider progesterone alone birth control pills if needed.  Patient is in agreement.  Other Stroke Risk Factors Former cigarette smoker, quit 1 year ago Obesity, Body mass index is 31.96 kg/m.  Migraines  Other Active Problems MRI contrast allergy - mild with cough, facial swelling, itchy - received benadryl 44m IV  Hospital day # 2  Neurology will sign off. Please call with questions. [facility] Thanks for the consult.  JRosalin Hawking MD PhD Stroke Neurology 06/25/2021 10:51 AM    To contact Stroke Continuity provider, please refer to [email or web address] After hours, contact General Neurology

## 2021-06-26 LAB — PROTEIN S, TOTAL: Protein S Ag, Total: 55 % — ABNORMAL LOW (ref 60–150)

## 2021-06-26 LAB — CARDIOLIPIN ANTIBODIES, IGG, IGM, IGA
Anticardiolipin IgA: <9 APL U/mL (ref 0–11)
Anticardiolipin IgG: <9 GPL U/mL (ref 0–14)
Anticardiolipin IgM: 39 MPL U/mL — ABNORMAL HIGH (ref 0–12)

## 2021-06-26 LAB — BETA-2-GLYCOPROTEIN I ABS, IGG/M/A
Beta-2 Glyco I IgG: <9 GPI IgG units (ref 0–20)
Beta-2-Glycoprotein I IgA: <9 GPI IgA units (ref 0–25)
Beta-2-Glycoprotein I IgM: <9 GPI IgM units (ref 0–32)

## 2021-06-26 LAB — LUPUS ANTICOAGULANT PANEL
DRVVT: 30.1 s (ref 0.0–47.0)
PTT Lupus Anticoagulant: 28.8 s (ref 0.0–51.9)

## 2021-06-26 LAB — PROTEIN S ACTIVITY: Protein S Activity: 42 % — ABNORMAL LOW (ref 63–140)

## 2021-06-26 LAB — PROTEIN C ACTIVITY: Protein C Activity: 153 % (ref 73–180)

## 2021-06-27 LAB — ANTINUCLEAR ANTIBODIES, IFA: ANA Ab, IFA: NEGATIVE

## 2021-06-27 LAB — HOMOCYSTEINE: Homocysteine: 7.8 umol/L (ref 0.0–14.5)

## 2021-06-29 ENCOUNTER — Telehealth: Payer: Self-pay

## 2021-06-29 ENCOUNTER — Ambulatory Visit (INDEPENDENT_AMBULATORY_CARE_PROVIDER_SITE_OTHER): Payer: Self-pay | Admitting: Family Medicine

## 2021-06-29 ENCOUNTER — Encounter: Payer: Self-pay | Admitting: Family Medicine

## 2021-06-29 ENCOUNTER — Other Ambulatory Visit: Payer: Self-pay

## 2021-06-29 VITALS — BP 128/80 | HR 99 | Resp 16 | Ht 66.0 in

## 2021-06-29 DIAGNOSIS — D509 Iron deficiency anemia, unspecified: Secondary | ICD-10-CM

## 2021-06-29 DIAGNOSIS — N920 Excessive and frequent menstruation with regular cycle: Secondary | ICD-10-CM

## 2021-06-29 DIAGNOSIS — I63411 Cerebral infarction due to embolism of right middle cerebral artery: Secondary | ICD-10-CM

## 2021-06-29 DIAGNOSIS — R519 Headache, unspecified: Secondary | ICD-10-CM

## 2021-06-29 DIAGNOSIS — F419 Anxiety disorder, unspecified: Secondary | ICD-10-CM

## 2021-06-29 MED ORDER — FLUOXETINE HCL 10 MG PO TABS: 10.0000 mg | ORAL_TABLET | Freq: Every day | ORAL | 1 refills | Status: DC

## 2021-06-29 NOTE — Telephone Encounter (Signed)
Transition Care Management Unsuccessful Follow-up Telephone Call  Date of discharge and from where:  [facility]  Attempts:  1st Attempt  Reason for unsuccessful TCM follow-up call:  Unable to leave message

## 2021-06-29 NOTE — Progress Notes (Addendum)
HPI: [name] is a 45 y.o. left handed female with hx of back pain and menorrhagia here today with her mother to follow on recent hospitalization. She was admitted on 06/23/21 and discharged on 06/25/21. She presents to the ED c/o lightheadedness, right arm weakness,chest pain, and headache . While in the hospital LUE and facial weakness.  Brain MRI: 06/23/21: 1. Mildly motion degraded exam. 2. 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 3. Additional 2 mm acute cortical infarct within the high posterior right frontal lobe (also within the right MCA vascular territory). 4. Subtle punctate acute infarct (versus artifact) within the high posterior left frontal lobe.  Brain MRI 06/24/21: 1. Stable appearance of acute/subacute nonhemorrhagic infarct involving the right insular cortex and external capsule. Stable appearance of acute/subacute nonhemorrhagic infarcts in the anterior frontal lobes bilaterally.2. Additional punctate foci of acute nonhemorrhagic infarction involving the right precentral gyrus and right parietal lobe.  She was on OCP's. No hx of sickle cell disease, reports hx of anemia.  No other risk factors identified. Coagulation studies have been otherwise negative. Protein S mildly low, normal protein C. Antithrombin III 111. PTT Lupus Anticoagulant 0.0 - 51.9 sec 28.8   DRVVT 0.0 - 47.0 sec 30.1   Lupus Anticoag Interp  Comment: VC      Beta-2 Glyco I IgG 0 - 20 GPI IgG units <9    Factor 5 Leiden: Result: c.1601G>A (p.Arg534Gln) - Not Detected  This result is not associated with an increased risk for venous thromboembolism.  Discharged on Plavix 75 mg daily to complete 3 weeks, Atorvastatin 40 mg,and Aspirin 81 mg daily. Tolerating medications well.  Lab Results  Component Value Date   CREATININE 0.83 06/25/2021   BUN 5 (L) 06/25/2021   NA 136 06/25/2021   K 3.5 06/25/2021   CL 108 06/25/2021    CO2 21 (L) 06/25/2021   Left sided weakness, it is improving. + Stiffness.  When walking fast she has lightheadedness and slurred speech, the latter one when she gets stress;so she ascribes it to anxiety. Her mother is concerned about "severe anxiety and panic attacks", no prior hx.  She is having headaches since menstrual period started. Hx of headache with menses and OCP was helping. She is also having "bad cramps." LMP 06/27/21.  Negative for CP,SOB,diaphoresis,or DOE. Sometimes she feels her hear racing when in bed at night, feels nervous, attributes this to stress. Negative for new focal weakness.  Cardiologist appt has been arranged. She does not have appt with neurologist yet.  She was also taking phentermine for wt loss. She has gained some wt, she is trying to improve dietary habits. She has not been consistent with regular physical activity.  She lives alone. No significant limitations with ADL's. She is not driving. Her mother lives close by.  Iron def anemia: She is on iron supplementation.  Lab Results  Component Value Date   WBC 4.1 06/25/2021   HGB 10.5 (L) 06/25/2021   HCT 34.3 (L) 06/25/2021   MCV 79.0 (L) 06/25/2021   PLT 230 06/25/2021   Review of Systems  Constitutional:  Positive for activity change and fatigue. Negative for appetite change and fever.  HENT:  Negative for mouth sores, nosebleeds, sore throat and trouble swallowing.   Eyes:  Negative for redness and visual disturbance.  Respiratory:  Negative for cough and wheezing.   Cardiovascular:  Negative for leg swelling.  Gastrointestinal:  Negative for  abdominal pain, nausea and vomiting.       Negative for changes in bowel habits.  Endocrine: Negative for cold intolerance and heat intolerance.  Genitourinary:  Negative for decreased urine volume, dysuria and hematuria.  Skin:  Negative for rash.  Neurological:  Negative for syncope and facial asymmetry.  Hematological:  Negative for  adenopathy. Does not bruise/bleed easily.  Psychiatric/Behavioral:  Negative for confusion. The patient is nervous/anxious.   Rest see pertinent positives and negatives per HPI.  Current Outpatient Medications on File Prior to Visit  Medication Sig Dispense Refill   acetaminophen (TYLENOL) 500 MG tablet Take 1,000 mg by mouth every 6 (six) hours as needed for mild pain or headache.     aspirin EC 81 MG EC tablet Take 1 tablet (81 mg total) by mouth daily. Swallow whole. 30 tablet 11   atorvastatin (LIPITOR) 40 MG tablet Take 1 tablet (40 mg total) by mouth daily. 30 tablet 0   clopidogrel (PLAVIX) 75 MG tablet Take 1 tablet (75 mg total) by mouth daily for 19 days. 19 tablet 0   ferrous sulfate 325 (65 FE) MG tablet Take 1 tablet (325 mg total) by mouth 2 (two) times daily with a meal. (Patient taking differently: Take 325 mg by mouth daily with breakfast.) 60 tablet 11   SUMAtriptan (IMITREX) 100 MG tablet Take one at onset of migraine and may repeat 1 in 2 hours.  Maximum of 2 in 24 hours. (Patient taking differently: Take one at onset of migraine and may repeat 1 in 2 hours.  Maximum of 2 in 24 hours.) 10 tablet 1   tizanidine (ZANAFLEX) 2 MG capsule TAKE ONE CAPSULE BY MOUTH THREE TIMES A DAY (Patient taking differently: Take 2 mg by mouth 3 (three) times daily as needed for muscle spasms.) 90 capsule 0   triamcinolone cream (KENALOG) 0.1 % Apply 1 application topically 2 (two) times daily. (Patient taking differently: Apply 1 application topically 2 (two) times daily as needed (eczema flare up).) 15 g 2   [DISCONTINUED] Cetirizine HCl 10 MG CAPS Take 1 capsule (10 mg total) by mouth daily. 15 capsule 0   No current facility-administered medications on file prior to visit.   Past Medical History:  Diagnosis Date   Anemia    Chronic headaches    Dysmenorrhea    Heart murmur    dx'd since childhood   Allergies  Allergen Reactions   Gadolinium Derivatives Itching, Swelling and Cough    Hydrocodone     Itching, takes Benadryl with this medicine   Tramadol Other (See Comments)    headache    Social History   Socioeconomic History   Marital status: Married    Spouse name: Not on file   Number of children: Not on file   Years of education: Not on file   Highest education level: Not on file  Occupational History   Not on file  Tobacco Use   Smoking status: Former    Types: Cigarettes    Quit date: 05/28/2014    Years since quitting: 7.0   Smokeless tobacco: Never  Vaping Use   Vaping Use: Never used  Substance and Sexual Activity   Alcohol use: No    Alcohol/week: 0.0 standard drinks   Drug use: No   Sexual activity: Yes    Partners: Male    Birth control/protection: Surgical    Comment: Tubal  Other Topics Concern   Not on file  Social History Narrative   Not on  file   Social Determinants of Health   Financial Resource Strain: Not on file  Food Insecurity: Not on file  Transportation Needs: Not on file  Physical Activity: Not on file  Stress: Not on file  Social Connections: Not on file   Vitals:   06/29/21 1427  BP: 128/80  Pulse: 99  Resp: 16  SpO2: 99%   Body mass index is 31.96 kg/m.  Physical Exam Vitals and nursing note reviewed.  Constitutional:      General: She is not in acute distress.    Appearance: She is well-developed.  HENT:     Head: Normocephalic and atraumatic.     Mouth/Throat:     Mouth: Mucous membranes are moist.     Pharynx: Oropharynx is clear.  Eyes:     Conjunctiva/sclera: Conjunctivae normal.  Cardiovascular:     Rate and Rhythm: Normal rate and regular rhythm.     Pulses:          Dorsalis pedis pulses are 2+ on the right side and 2+ on the left side.     Heart sounds: No murmur heard. Pulmonary:     Effort: Pulmonary effort is normal. No respiratory distress.     Breath sounds: Normal breath sounds.  Abdominal:     Palpations: Abdomen is soft. There is no hepatomegaly or mass.     Tenderness:  There is no abdominal tenderness.  Lymphadenopathy:     Cervical: No cervical adenopathy.  Skin:    General: Skin is warm.     Findings: No erythema or rash.  Neurological:     Mental Status: She is alert and oriented to person, place, and time.     Cranial Nerves: No cranial nerve deficit.     Gait: Gait normal.     Comments: Stable gait, able to walk without assistance. Left hand weakness, mainly index finger and mildly decrease sensation LUE. Rest of neuro exam otherwise normal.  Psychiatric:        Mood and Affect: Mood is anxious.     Comments: Well groomed, good eye contact.   ASSESSMENT AND PLAN:  Ms.Noralee was seen today for hospitalization follow-up.  Diagnoses and all orders for this visit: Orders Placed This Encounter  Procedures   Ambulatory referral to Neurology   Ambulatory referral to Physical Therapy   Cerebrovascular accident (CVA) due to embolism of right middle cerebral artery (HCC) Continue Plavix 75 mg daily and complete 3 weeks. Continue Aspirin 81 mg daily and Atorvastatin 40 mg daily. LDL goal < 70. Aggressive management of CV risk factors recommended for secondary prevention. Neuro referral placed.  Nonintractable headache, unspecified chronicity pattern, unspecified headache type Problem was better controled with OCP's but now contraindicated. Tylenol 500 mg 3-4 times per day prn. Continue Imitrex daily prn. Instructed about warning signs.  Anxiety disorder, unspecified type We discussed pharmacologic treatment option as well as side effects. She agrees with trying Fluoxetine low dose. Instructed about warning signs.  -     FLUoxetine (PROZAC) 10 MG tablet; Take 1 tablet (10 mg total) by mouth daily.  Menorrhagia with regular cycle Symptomatic after discontinuation of OCP's. Recommend arranging f/u appt with her gyn.  Iron deficiency anemia, unspecified iron deficiency anemia type Most likely caused by heavy menses/DUB. Continue Fe Sulfate  325 mg daily. She has not had colonoscopy yet, can be discussed with PCP once recent health issues stabilized.  I spent a total of 41 minutes in both face to face and non face  to face activities for this visit on the date of this encounter. During this time history was obtained and documented, examination was performed, prior labs/imaging reviewed, and assessment/plan discussed.  Return in about 4 weeks (around 07/27/2021) for Anxiety with DR Sarajane Jews.  Veola Cafaro G. [place], MD  [facility]. [place] office.

## 2021-06-29 NOTE — Patient Instructions (Addendum)
A few things to remember from today's visit:  Cerebrovascular accident (CVA) due to embolism of right middle cerebral artery ([place]) - Plan: Ambulatory referral to Neurology, Ambulatory referral to Physical Therapy  Nonintractable headache, unspecified chronicity pattern, unspecified headache type  Anxiety disorder, unspecified type - Plan: FLUoxetine (PROZAC) 10 MG tablet  If you need refills please call your pharmacy. Do not use My Chart to request refills or for acute issues that need immediate attention.   Try to go back to a healthful diet, low carb and low sugar. Tylenol 500 mg 2-3 per day for headache. Arrange appt with gyn to discuss treatment for cramps.  PT will be arranged as well as neuro referral. Today we started Fluoxetine , this type of medications can increase suicidal risk. This is more prevalent among children,adolecents, and young adults with major depression or other psychiatric disorders. It can also make depression worse. Most common side effects are gastrointestinal, self limited after a few weeks: diarrhea, nausea, constipation  Or diarrhea among some.  In general it is well tolerated. We will follow closely.     Please be sure medication list is accurate. If a new problem present, please set up appointment sooner than planned today.

## 2021-06-30 LAB — PROTHROMBIN GENE MUTATION

## 2021-06-30 LAB — FACTOR 5 LEIDEN

## 2021-06-30 NOTE — Telephone Encounter (Signed)
Transition Care Management Unsuccessful Follow-up Telephone Call  Date of discharge and from where:  [facility]/2021  Attempts:  2nd Attempt  Reason for unsuccessful TCM follow-up call:  Unable to leave message

## 2021-07-01 ENCOUNTER — Ambulatory Visit: Payer: Self-pay | Admitting: *Deleted

## 2021-07-01 LAB — PHOSPHATIDYLSERINE ANTIBODIES
Phosphatydalserine, IgA: <1 APS Units (ref 0–19)
Phosphatydalserine, IgG: 9 Units (ref 0–30)
Phosphatydalserine, IgM: 20 Units (ref 0–30)

## 2021-07-01 NOTE — Telephone Encounter (Signed)
Pt calling in on the community line.    I had a question.  I had a stroke last Thursday.   How much should I be talking, walking and all?   I don't know what I should be doing.     I have an appt on Monday I don't know what it's about.   Some kind of echo something.    I asked her if she had a neurologist or a primary care physician.   She doesn't know about the neurologist but she sees Dr. Alysia Penna at [facility] as her primary doctor.    "That's who I called but I got transferred to you".  ([facility]).   She asked who I was and I explained I was a nurse for [facility] that answers questions and assists patients.  She asked,  "So you're not Dr. Barbie Banner nurse" and I let her know this was not Dr. Barbie Banner office.    I was transferred to you.   I called [facility] I thought.  I called them and I got transferred to someone who then transferred me to you.   I called Dr. Barbie Banner office.  It's [phone] and got transferred to you.     "Thank you anyway and have a great weekend" and she hung up.     I called into the [place] office and let the person that answered the phone know what happened.   I gave her the pt's name and DOB.    She will call the pt back.   I'm entering this note into her chart for them to access.

## 2021-07-04 ENCOUNTER — Other Ambulatory Visit: Payer: Self-pay | Admitting: Cardiology

## 2021-07-04 ENCOUNTER — Other Ambulatory Visit: Payer: Self-pay

## 2021-07-04 ENCOUNTER — Ambulatory Visit (INDEPENDENT_AMBULATORY_CARE_PROVIDER_SITE_OTHER): Payer: Self-pay | Admitting: Cardiology

## 2021-07-04 ENCOUNTER — Ambulatory Visit (INDEPENDENT_AMBULATORY_CARE_PROVIDER_SITE_OTHER): Payer: Self-pay

## 2021-07-04 ENCOUNTER — Encounter: Payer: Self-pay | Admitting: Cardiology

## 2021-07-04 VITALS — BP 130/62 | HR 88 | Ht 66.0 in | Wt 195.0 lb

## 2021-07-04 DIAGNOSIS — E669 Obesity, unspecified: Secondary | ICD-10-CM

## 2021-07-04 DIAGNOSIS — I4891 Unspecified atrial fibrillation: Secondary | ICD-10-CM

## 2021-07-04 DIAGNOSIS — I639 Cerebral infarction, unspecified: Secondary | ICD-10-CM

## 2021-07-04 DIAGNOSIS — R2689 Other abnormalities of gait and mobility: Secondary | ICD-10-CM

## 2021-07-04 DIAGNOSIS — E782 Mixed hyperlipidemia: Secondary | ICD-10-CM

## 2021-07-04 NOTE — H&P (View-Only) (Signed)
Cardiology Office Note:    Date:  07/04/2021   ID:  MYKENZIE EBANKS, DOB Jul 22, 1976, MRN [id number]  PCP:  Laurey Morale, MD  Cardiologist:  Berniece Salines, DO  Electrophysiologist:  None   Referring MD: Laurey Morale, MD   Chief Complaint  Patient presents with   New Patient (Initial Visit)    Needs TEE for CVA.   Headache   History of Present Illness:    Angela Hahn is a 45 y.o. 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  The patient was recently hospitalized at [facility] where she was found to have had multiple strokes based on MRI.  She was started on aspirin and Plavix and Lipitor.  During her hospital stay there were multiple discussions which included cardiology follow-up for outpatient TEE given high suspicion that her stroke was embolic in nature.  She presents today as a hospital follow-up and to establish cardiac care.  She is with her mother and her daughter-in-law and have multiple questions about a transesophageal echocardiogram.  She tells me since her hospitalization she has had significant anxiety and was started on antianxiety medications by her PCP but this has not been started as it needs to be changed due to medication interaction.  She denies any chest pain or shortness of breath.  She admits to gait instability but has not had her physical therapy occupational therapy yet.   Past Medical History:  Diagnosis Date   Anemia    Chronic headaches    Dysmenorrhea    Heart murmur    dx'd since childhood    Past Surgical History:  Procedure Laterality Date   TUBAL LIGATION      Current Medications: Current Meds  Medication Sig   acetaminophen (TYLENOL) 500 MG tablet Take 1,000 mg by mouth every 6 (six) hours as needed for mild pain  or headache.   aspirin EC 81 MG EC tablet Take 1 tablet (81 mg total) by mouth daily. Swallow whole.   atorvastatin (LIPITOR) 40 MG tablet Take 1 tablet (40 mg total) by mouth daily.   clopidogrel (PLAVIX) 75 MG tablet Take 1 tablet (75 mg total) by mouth daily for 19 days.   ferrous sulfate 325 (65 FE) MG tablet Take 1 tablet (325 mg total) by mouth 2 (two) times daily with a meal. (Patient taking differently: Take 325 mg by mouth daily with breakfast.)   FLUoxetine (PROZAC) 10 MG tablet Take 1 tablet (10 mg total) by mouth daily.   SUMAtriptan (IMITREX) 100 MG tablet Take one at onset of migraine and may repeat 1 in 2 hours.  Maximum of 2 in 24 hours. (Patient taking differently: Take one at onset of migraine and may repeat 1 in 2 hours.  Maximum of 2 in 24 hours.)   tizanidine (ZANAFLEX) 2 MG capsule TAKE ONE CAPSULE BY MOUTH THREE TIMES A DAY (Patient taking differently: Take 2 mg by mouth 3 (three) times daily as needed for muscle spasms.)   triamcinolone cream (KENALOG) 0.1 % Apply 1 application topically 2 (two) times daily. (Patient taking differently: Apply 1 application topically 2 (two) times daily as needed (eczema flare up).)     Allergies:   Gadolinium derivatives, Hydrocodone, and Tramadol   Social History   Socioeconomic History   Marital status: Single  Spouse name: Not on file   Number of children: Not on file   Years of education: Not on file   Highest education level: Not on file  Occupational History   Not on file  Tobacco Use   Smoking status: Former    Types: Cigarettes    Quit date: 05/28/2014    Years since quitting: 7.1   Smokeless tobacco: Never  Vaping Use   Vaping Use: Never used  Substance and Sexual Activity   Alcohol use: No    Alcohol/week: 0.0 standard drinks   Drug use: No   Sexual activity: Yes    Partners: Male    Birth control/protection: Surgical    Comment: Tubal  Other Topics Concern   Not on file  Social History Narrative   Not on  file   Social Determinants of Health   Financial Resource Strain: Not on file  Food Insecurity: Not on file  Transportation Needs: Not on file  Physical Activity: Not on file  Stress: Not on file  Social Connections: Not on file     Family History: The patient's family history includes Arthritis in her mother; Depression in her mother; Diabetes in her father and mother; Hypertension in her father and mother.  ROS:   Review of Systems  Constitution: Negative for decreased appetite, fever and weight gain.  HENT: Negative for congestion, ear discharge, hoarse voice and sore throat.   Eyes: Negative for discharge, redness, vision loss in right eye and visual halos.  Cardiovascular: Negative for chest pain, dyspnea on exertion, leg swelling, orthopnea and palpitations.  Respiratory: Negative for cough, hemoptysis, shortness of breath and snoring.   Endocrine: Negative for heat intolerance and polyphagia.  Hematologic/Lymphatic: Negative for bleeding problem. Does not bruise/bleed easily.  Skin: Negative for flushing, nail changes, rash and suspicious lesions.  Musculoskeletal: Negative for arthritis, joint pain, muscle cramps, myalgias, neck pain and stiffness.  Gastrointestinal: Negative for abdominal pain, bowel incontinence, diarrhea and excessive appetite.  Genitourinary: Negative for decreased libido, genital sores and incomplete emptying.  Neurological: Negative for brief paralysis, focal weakness, headaches and loss of balance.  Psychiatric/Behavioral: Negative for altered mental status, depression and suicidal ideas.  Allergic/Immunologic: Negative for HIV exposure and persistent infections.    EKGs/Labs/Other Studies Reviewed:    The following studies were reviewed today:   EKG:  The ekg ordered today demonstrates sinus rhythm, heart rate 88 bpm  TTE 06/24/2021 IMPRESSIONS   1. Left ventricular ejection fraction, by estimation, is 60 to 65%. The left ventricle has normal  function. The left ventricle has no regional wall motion abnormalities. Left ventricular diastolic parameters were normal.   2. Right ventricular systolic function is normal. The right ventricular size is normal.   3. The mitral valve is grossly normal. Trivial mitral valve regurgitation.   4. The aortic valve is normal in structure. Aortic valve regurgitation is not visualized. No aortic stenosis is present.   FINDINGS   Left Ventricle: Left ventricular ejection fraction, by estimation, is 60 to 65%. The left ventricle has normal function. The left ventricle has no regional wall motion abnormalities. The left ventricular internal cavity size was normal in size. There is   no left ventricular hypertrophy. Left ventricular diastolic parameters were normal.   Right Ventricle: The right ventricular size is normal. Right vetricular  wall thickness was not well visualized. Right ventricular systolic  function is normal.   Left Atrium: Left atrial size was normal in size.   Right Atrium: Right atrial size  was normal in size.   Pericardium: There is no evidence of pericardial effusion.   Mitral Valve: The mitral valve is grossly normal. Trivial mitral valve  regurgitation.   Tricuspid Valve: The tricuspid valve is grossly normal. Tricuspid valve  regurgitation is trivial.   Aortic Valve: The aortic valve is normal in structure. Aortic valve  regurgitation is not visualized. No aortic stenosis is present. Aortic  valve peak gradient measures 8.1 mmHg.   Pulmonic Valve: The pulmonic valve was not well visualized. Pulmonic valve  regurgitation is trivial.   Aorta: The aortic root and ascending aorta are structurally normal, with  no evidence of dilitation.   IAS/Shunts: The interatrial septum was not well visualized.   Recent Labs: 06/23/2021: ALT 23 06/24/2021: TSH 1.964 06/25/2021: BUN 5; Creatinine, Ser 0.83; Hemoglobin 10.5; Platelets 230; Potassium 3.5; Sodium 136  Recent Lipid  Panel    Component Value Date/Time   CHOL 179 06/24/2021 0314   TRIG 115 06/24/2021 0314   HDL 61 06/24/2021 0314   CHOLHDL 2.9 06/24/2021 0314   VLDL 23 06/24/2021 0314   LDLCALC 95 06/24/2021 0314    Physical Exam:    VS:  BP 130/62 (BP Location: Right Arm, Patient Position: Sitting, Cuff Size: Large)   Pulse 88   Ht 5\' 6"  (1.676 m)   Wt 195 lb (88.5 kg)   LMP 06/05/2021   BMI 31.47 kg/m     Wt Readings from Last 3 Encounters:  07/04/21 195 lb (88.5 kg)  06/23/21 198 lb (89.8 kg)  05/20/21 198 lb (89.8 kg)     GEN: Well nourished, well developed in no acute distress HEENT: Normal NECK: No JVD; No carotid bruits LYMPHATICS: No lymphadenopathy CARDIAC: S1S2 noted,RRR, no murmurs, rubs, gallops RESPIRATORY:  Clear to auscultation without rales, wheezing or rhonchi  ABDOMEN: Soft, non-tender, non-distended, +bowel sounds, no guarding. EXTREMITIES: No edema, No cyanosis, no clubbing MUSCULOSKELETAL:  No deformity  SKIN: Warm and dry NEUROLOGIC:  Alert and oriented x 3, non-focal PSYCHIATRIC:  Normal affect, good insight  ASSESSMENT:    1. Cerebrovascular accident (CVA), unspecified mechanism ([place])   2. Mixed hyperlipidemia    PLAN:    She is agreeable to review her MRI and agree that with multiple areas her stroke may have been embolic in nature.  I reviewed her echocardiogram and agree (atrial septum was not well visualized at that time.  We will move forward with a transesophageal echocardiogram I explained to the patient that about the procedure she is agreeable to proceed.  Shared Decision Making/Informed Consent The risks [esophageal damage, perforation (1:10,000 risk), bleeding, pharyngeal hematoma as well as other potential complications associated with conscious sedation including aspiration, arrhythmia, respiratory failure and death], benefits (treatment guidance and diagnostic support) and alternatives of a transesophageal echocardiogram were discussed in  detail with Ms. Lohmann and she is willing to proceed.    She will also benefit from ambulatory monitoring for now plan for 14-day ZIO monitor if this is negative she may benefit from a loop recorder.  Continue aspirin, Plavix and statin for now.  She has a upcoming follow-up with neurology.  Pending start of PT/OT   Blood pressure is acceptable in the office today.  The patient understands the need to lose weight with diet and exercise. We have discussed specific strategies for this.  The patient is in agreement with the above plan. The patient left the office in stable condition.  The patient will follow up in   Medication Adjustments/Labs and  Tests Ordered: Current medicines are reviewed at length with the patient today.  Concerns regarding medicines are outlined above.  No orders of the defined types were placed in this encounter.  No orders of the defined types were placed in this encounter.   Patient Instructions  Medication Instructions:  Your physician recommends that you continue on your current medications as directed. Please refer to the Current Medication list given to you today.  *If you need a refill on your cardiac medications before your next appointment, please call your pharmacy*   Lab Work: Your physician recommends that you return for lab work in:  End of this week beginning of next week: BMET, Mag, CBC If you have labs (blood work) drawn today and your tests are completely normal, you will receive your results only by: [place] (if you have MyChart) OR A paper copy in the mail If you have any lab test that is abnormal or we need to change your treatment, we will call you to review the results.   Testing/Procedures: Your physician has requested that you have a TEE. During a TEE, sound waves are used to create images of your heart. It provides your doctor with information about the size and shape of your heart and how well your heart's chambers and valves  are working. In this test, a transducer is attached to the end of a flexible tube that's guided down your throat and into your esophagus (the tube leading from you mouth to your stomach) to get a more detailed image of your heart. You are not awake for the procedure. Please see the instruction sheet given to you today. For further information please visit [email or web address].             PATIENT INSTRUCTIONS    CARDIOVERSION/TEE CARDIOVERSION     [facility] TOWER- MAIN ENTRANCE "A"     [place] AND TIME OF PROCEDURE: November 17th     Please register at the Admitting Department at   [place] ANYTHING after midnight prior to your procedure.  You should take your medications as usual with a sip of water.  If you are diabetic, DO NOT TAKE YOUR DIABETIC MEDICATIONS the morning OF THE PROCEDURE.  If you are taking Lanoxin (also called Digoxin), do NOT take this medication the morning of the procedure.  DO NOT STOP any blood thinners that you may be taking.  Have your blood drawn as intructed.  You will need someone with you to drive you home after the procedure.  If you have any questions, please call our office at [phone].  ZIO XT- Long Term Monitor Instructions  Your physician has requested you wear a ZIO patch monitor for 14 days.  This is a single patch monitor. [employer] supplies one patch monitor per enrollment. Additional stickers are not available. Please do not apply patch if you will be having a Nuclear Stress Test,  Echocardiogram, Cardiac CT, MRI, or Chest Xray during the period you would be wearing the  monitor. The patch cannot be worn during these tests. You cannot remove and re-apply the  ZIO XT patch monitor.  Your ZIO patch monitor will be mailed 3 day USPS to your address on file. It may take 3-5 days  to receive your monitor after you have been enrolled.  Once you have received your monitor,  please review the enclosed instructions. Your monitor  has already been registered assigning a specific monitor serial # to you.  Billing and Patient Assistance Program Information  We have supplied [employer] with any of your insurance information on file for billing purposes. [employer] offers a sliding scale Patient Assistance Program for patients that do not have  insurance, or whose insurance does not completely cover the cost of the ZIO monitor.  You must apply for the Patient Assistance Program to qualify for this discounted rate.  To apply, please call [employer] at [phone], select option 4, select option 2, ask to apply for  Patient Assistance Program. Theodore Demark will ask your household income, and how many people  are in your household. They will quote your out-of-pocket cost based on that information.  [employer] will also be able to set up a 17-month, interest-free payment plan if needed.  Applying the monitor   Shave hair from upper left chest.  Hold abrader disc by orange tab. Rub abrader in 40 strokes over the upper left chest as  indicated in your monitor instructions.  Clean area with 4 enclosed alcohol pads. Let dry.  Apply patch as indicated in monitor instructions. Patch will be placed under collarbone on left  side of chest with arrow pointing upward.  Rub patch adhesive wings for 2 minutes. Remove white label marked "1". Remove the white  label marked "2". Rub patch adhesive wings for 2 additional minutes.  While looking in a mirror, press and release button in center of patch. A small green light will  flash 3-4 times. This will be your only indicator that the monitor has been turned on.  Do not shower for the first 24 hours. You may shower after the first 24 hours.  Press the button if you feel a symptom. You will hear a small click. Record Date, Time and  Symptom in the Patient Logbook.  When you are ready to remove the patch, follow instructions on the last 2 pages of  Patient  Logbook. Stick patch monitor onto the last page of Patient Logbook.  Place Patient Logbook in the blue and white box. Use locking tab on box and tape box closed  securely. The blue and white box has prepaid postage on it. Please place it in the mailbox as  soon as possible. Your physician should have your test results approximately 7 days after the  monitor has been mailed back to [facility].  Call [place] at [phone] if you have questions regarding  your ZIO XT patch monitor. Call them immediately if you see an orange light blinking on your  monitor.  If your monitor falls off in less than 4 days, contact our Monitor department at [phone].  If your monitor becomes loose or falls off after 4 days call [employer] at [phone] for  suggestions on securing your monitor.   Follow-Up: At [facility], you and your health needs are our priority.  As part of our continuing mission to provide you with exceptional heart care, we have created designated Provider Care Teams.  These Care Teams include your primary Cardiologist (physician) and Advanced Practice Providers (APPs -  Physician Assistants and Nurse Practitioners) who all work together to provide you with the care you need, when you need it.  We recommend signing up for the patient portal called "MyChart".  Sign up information is provided on this After Visit Summary.  MyChart is used to connect with patients for Virtual Visits (Telemedicine).  Patients are able to view lab/test results, encounter notes,  upcoming appointments, etc.  Non-urgent messages can be sent to your provider as well.   To learn more about what you can do with MyChart, go to [email or web address].    Your next appointment:   12 week(s)  The format for your next appointment:   In Person  Provider:   Berniece Salines, DO If MD is not listed, click here to update    :1}    Other Instructions     Adopting a Healthy  Lifestyle.  Know what a healthy weight is for you (roughly BMI <25) and aim to maintain this   Aim for 7+ servings of fruits and vegetables daily   65-80+ fluid ounces of water or unsweet tea for healthy kidneys   Limit to max 1 drink of alcohol per day; avoid smoking/tobacco   Limit animal fats in diet for cholesterol and heart health - choose grass fed whenever available   Avoid highly processed foods, and foods high in saturated/trans fats   Aim for low stress - take time to unwind and care for your mental health   Aim for 150 min of moderate intensity exercise weekly for heart health, and weights twice weekly for bone health   Aim for 7-9 hours of sleep daily   When it comes to diets, agreement about the perfect plan isnt easy to find, even among the experts. Experts at the [employer] developed an idea known as the Healthy Eating Plate. Just imagine a plate divided into logical, healthy portions.   The emphasis is on diet quality:   Load up on vegetables and fruits - one-half of your plate: Aim for color and variety, and remember that potatoes dont count.   Go for whole grains - one-quarter of your plate: Whole wheat, barley, wheat berries, quinoa, oats, brown rice, and foods made with them. If you want pasta, go with whole wheat pasta.   Protein power - one-quarter of your plate: Fish, chicken, beans, and nuts are all healthy, versatile protein sources. Limit red meat.   The diet, however, does go beyond the plate, offering a few other suggestions.   Use healthy plant oils, such as olive, canola, soy, corn, sunflower and peanut. Check the labels, and avoid partially hydrogenated oil, which have unhealthy trans fats.   If youre thirsty, drink water. Coffee and tea are good in moderation, but skip sugary drinks and limit milk and dairy products to one or two daily servings.   The type of carbohydrate in the diet is more important than the amount. Some  sources of carbohydrates, such as vegetables, fruits, whole grains, and beans-are healthier than others.   Finally, stay active  Signed, Berniece Salines, DO  07/04/2021 2:16 PM    [facility]

## 2021-07-04 NOTE — Progress Notes (Unsigned)
Patient enrolled for [employer] to mail a 14 day ZIO XT Monitor to her address on file. Monitor applied in office on 07/14/21.

## 2021-07-04 NOTE — Progress Notes (Signed)
Cardiology Office Note:    Date:  07/04/2021   ID:  Angela Hahn, DOB [birth year], MRN [id number]  PCP:  Laurey Morale, MD  Cardiologist:  Berniece Salines, DO  Electrophysiologist:  None   Referring MD: Laurey Morale, MD   Chief Complaint  Patient presents with   New Patient (Initial Visit)    Needs TEE for CVA.   Headache   History of Present Illness:    Angela Hahn is a 45 y.o. 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  The patient was recently hospitalized at [facility] where she was found to have had multiple strokes based on MRI.  She was started on aspirin and Plavix and Lipitor.  During her hospital stay there were multiple discussions which included cardiology follow-up for outpatient TEE given high suspicion that her stroke was embolic in nature.  She presents today as a hospital follow-up and to establish cardiac care.  She is with her mother and her daughter-in-law and have multiple questions about a transesophageal echocardiogram.  She tells me since her hospitalization she has had significant anxiety and was started on antianxiety medications by her PCP but this has not been started as it needs to be changed due to medication interaction.  She denies any chest pain or shortness of breath.  She admits to gait instability but has not had her physical therapy occupational therapy yet.   Past Medical History:  Diagnosis Date   Anemia    Chronic headaches    Dysmenorrhea    Heart murmur    dx'd since childhood    Past Surgical History:  Procedure Laterality Date   TUBAL LIGATION      Current Medications: Current Meds  Medication Sig   acetaminophen (TYLENOL) 500 MG tablet Take 1,000 mg by mouth every 6 (six) hours as needed for mild pain  or headache.   aspirin EC 81 MG EC tablet Take 1 tablet (81 mg total) by mouth daily. Swallow whole.   atorvastatin (LIPITOR) 40 MG tablet Take 1 tablet (40 mg total) by mouth daily.   clopidogrel (PLAVIX) 75 MG tablet Take 1 tablet (75 mg total) by mouth daily for 19 days.   ferrous sulfate 325 (65 FE) MG tablet Take 1 tablet (325 mg total) by mouth 2 (two) times daily with a meal. (Patient taking differently: Take 325 mg by mouth daily with breakfast.)   FLUoxetine (PROZAC) 10 MG tablet Take 1 tablet (10 mg total) by mouth daily.   SUMAtriptan (IMITREX) 100 MG tablet Take one at onset of migraine and may repeat 1 in 2 hours.  Maximum of 2 in 24 hours. (Patient taking differently: Take one at onset of migraine and may repeat 1 in 2 hours.  Maximum of 2 in 24 hours.)   tizanidine (ZANAFLEX) 2 MG capsule TAKE ONE CAPSULE BY MOUTH THREE TIMES A DAY (Patient taking differently: Take 2 mg by mouth 3 (three) times daily as needed for muscle spasms.)   triamcinolone cream (KENALOG) 0.1 % Apply 1 application topically 2 (two) times daily. (Patient taking differently: Apply 1 application topically 2 (two) times daily as needed (eczema flare up).)     Allergies:   Gadolinium derivatives, Hydrocodone, and Tramadol   Social History   Socioeconomic History   Marital status: Single  Spouse name: Not on file   Number of children: Not on file   Years of education: Not on file   Highest education level: Not on file  Occupational History   Not on file  Tobacco Use   Smoking status: Former    Types: Cigarettes    Quit date: 05/28/2014    Years since quitting: 7.1   Smokeless tobacco: Never  Vaping Use   Vaping Use: Never used  Substance and Sexual Activity   Alcohol use: No    Alcohol/week: 0.0 standard drinks   Drug use: No   Sexual activity: Yes    Partners: Male    Birth control/protection: Surgical    Comment: Tubal  Other Topics Concern   Not on file  Social History Narrative   Not on  file   Social Determinants of Health   Financial Resource Strain: Not on file  Food Insecurity: Not on file  Transportation Needs: Not on file  Physical Activity: Not on file  Stress: Not on file  Social Connections: Not on file     Family History: The patient's family history includes Arthritis in her mother; Depression in her mother; Diabetes in her father and mother; Hypertension in her father and mother.  ROS:   Review of Systems  Constitution: Negative for decreased appetite, fever and weight gain.  HENT: Negative for congestion, ear discharge, hoarse voice and sore throat.   Eyes: Negative for discharge, redness, vision loss in right eye and visual halos.  Cardiovascular: Negative for chest pain, dyspnea on exertion, leg swelling, orthopnea and palpitations.  Respiratory: Negative for cough, hemoptysis, shortness of breath and snoring.   Endocrine: Negative for heat intolerance and polyphagia.  Hematologic/Lymphatic: Negative for bleeding problem. Does not bruise/bleed easily.  Skin: Negative for flushing, nail changes, rash and suspicious lesions.  Musculoskeletal: Negative for arthritis, joint pain, muscle cramps, myalgias, neck pain and stiffness.  Gastrointestinal: Negative for abdominal pain, bowel incontinence, diarrhea and excessive appetite.  Genitourinary: Negative for decreased libido, genital sores and incomplete emptying.  Neurological: Negative for brief paralysis, focal weakness, headaches and loss of balance.  Psychiatric/Behavioral: Negative for altered mental status, depression and suicidal ideas.  Allergic/Immunologic: Negative for HIV exposure and persistent infections.    EKGs/Labs/Other Studies Reviewed:    The following studies were reviewed today:   EKG:  The ekg ordered today demonstrates sinus rhythm, heart rate 88 bpm  TTE 06/24/2021 IMPRESSIONS   1. Left ventricular ejection fraction, by estimation, is 60 to 65%. The left ventricle has normal  function. The left ventricle has no regional wall motion abnormalities. Left ventricular diastolic parameters were normal.   2. Right ventricular systolic function is normal. The right ventricular size is normal.   3. The mitral valve is grossly normal. Trivial mitral valve regurgitation.   4. The aortic valve is normal in structure. Aortic valve regurgitation is not visualized. No aortic stenosis is present.   FINDINGS   Left Ventricle: Left ventricular ejection fraction, by estimation, is 60 to 65%. The left ventricle has normal function. The left ventricle has no regional wall motion abnormalities. The left ventricular internal cavity size was normal in size. There is   no left ventricular hypertrophy. Left ventricular diastolic parameters were normal.   Right Ventricle: The right ventricular size is normal. Right vetricular  wall thickness was not well visualized. Right ventricular systolic  function is normal.   Left Atrium: Left atrial size was normal in size.   Right Atrium: Right atrial size  was normal in size.   Pericardium: There is no evidence of pericardial effusion.   Mitral Valve: The mitral valve is grossly normal. Trivial mitral valve  regurgitation.   Tricuspid Valve: The tricuspid valve is grossly normal. Tricuspid valve  regurgitation is trivial.   Aortic Valve: The aortic valve is normal in structure. Aortic valve  regurgitation is not visualized. No aortic stenosis is present. Aortic  valve peak gradient measures 8.1 mmHg.   Pulmonic Valve: The pulmonic valve was not well visualized. Pulmonic valve  regurgitation is trivial.   Aorta: The aortic root and ascending aorta are structurally normal, with  no evidence of dilitation.   IAS/Shunts: The interatrial septum was not well visualized.   Recent Labs: 06/23/2021: ALT 23 06/24/2021: TSH 1.964 06/25/2021: BUN 5; Creatinine, Ser 0.83; Hemoglobin 10.5; Platelets 230; Potassium 3.5; Sodium 136  Recent Lipid  Panel    Component Value Date/Time   CHOL 179 06/24/2021 0314   TRIG 115 06/24/2021 0314   HDL 61 06/24/2021 0314   CHOLHDL 2.9 06/24/2021 0314   VLDL 23 06/24/2021 0314   LDLCALC 95 06/24/2021 0314    Physical Exam:    VS:  BP 130/62 (BP Location: Right Arm, Patient Position: Sitting, Cuff Size: Large)   Pulse 88   Ht 5\' 6"  (1.676 m)   Wt 195 lb (88.5 kg)   LMP 06/05/2021   BMI 31.47 kg/m     Wt Readings from Last 3 Encounters:  07/04/21 195 lb (88.5 kg)  06/23/21 198 lb (89.8 kg)  05/20/21 198 lb (89.8 kg)     GEN: Well nourished, well developed in no acute distress HEENT: Normal NECK: No JVD; No carotid bruits LYMPHATICS: No lymphadenopathy CARDIAC: S1S2 noted,RRR, no murmurs, rubs, gallops RESPIRATORY:  Clear to auscultation without rales, wheezing or rhonchi  ABDOMEN: Soft, non-tender, non-distended, +bowel sounds, no guarding. EXTREMITIES: No edema, No cyanosis, no clubbing MUSCULOSKELETAL:  No deformity  SKIN: Warm and dry NEUROLOGIC:  Alert and oriented x 3, non-focal PSYCHIATRIC:  Normal affect, good insight  ASSESSMENT:    1. Cerebrovascular accident (CVA), unspecified mechanism ([place])   2. Mixed hyperlipidemia    PLAN:    She is agreeable to review her MRI and agree that with multiple areas her stroke may have been embolic in nature.  I reviewed her echocardiogram and agree (atrial septum was not well visualized at that time.  We will move forward with a transesophageal echocardiogram I explained to the patient that about the procedure she is agreeable to proceed.  Shared Decision Making/Informed Consent The risks [esophageal damage, perforation (1:10,000 risk), bleeding, pharyngeal hematoma as well as other potential complications associated with conscious sedation including aspiration, arrhythmia, respiratory failure and death], benefits (treatment guidance and diagnostic support) and alternatives of a transesophageal echocardiogram were discussed in  detail with Ms. Bisson and she is willing to proceed.    She will also benefit from ambulatory monitoring for now plan for 14-day ZIO monitor if this is negative she may benefit from a loop recorder.  Continue aspirin, Plavix and statin for now.  She has a upcoming follow-up with neurology.  Pending start of PT/OT   Blood pressure is acceptable in the office today.  The patient understands the need to lose weight with diet and exercise. We have discussed specific strategies for this.  The patient is in agreement with the above plan. The patient left the office in stable condition.  The patient will follow up in   Medication Adjustments/Labs and  Tests Ordered: Current medicines are reviewed at length with the patient today.  Concerns regarding medicines are outlined above.  No orders of the defined types were placed in this encounter.  No orders of the defined types were placed in this encounter.   Patient Instructions  Medication Instructions:  Your physician recommends that you continue on your current medications as directed. Please refer to the Current Medication list given to you today.  *If you need a refill on your cardiac medications before your next appointment, please call your pharmacy*   Lab Work: Your physician recommends that you return for lab work in:  End of this week beginning of next week: BMET, Mag, CBC If you have labs (blood work) drawn today and your tests are completely normal, you will receive your results only by: [place] (if you have MyChart) OR A paper copy in the mail If you have any lab test that is abnormal or we need to change your treatment, we will call you to review the results.   Testing/Procedures: Your physician has requested that you have a TEE. During a TEE, sound waves are used to create images of your heart. It provides your doctor with information about the size and shape of your heart and how well your heart's chambers and valves  are working. In this test, a transducer is attached to the end of a flexible tube that's guided down your throat and into your esophagus (the tube leading from you mouth to your stomach) to get a more detailed image of your heart. You are not awake for the procedure. Please see the instruction sheet given to you today. For further information please visit [email or web address].             PATIENT INSTRUCTIONS    CARDIOVERSION/TEE CARDIOVERSION     [facility] TOWER- MAIN ENTRANCE "A"     [place] AND TIME OF PROCEDURE: November 17th     Please register at the Admitting Department at   [place] ANYTHING after midnight prior to your procedure.  You should take your medications as usual with a sip of water.  If you are diabetic, DO NOT TAKE YOUR DIABETIC MEDICATIONS the morning OF THE PROCEDURE.  If you are taking Lanoxin (also called Digoxin), do NOT take this medication the morning of the procedure.  DO NOT STOP any blood thinners that you may be taking.  Have your blood drawn as intructed.  You will need someone with you to drive you home after the procedure.  If you have any questions, please call our office at [phone].  ZIO XT- Long Term Monitor Instructions  Your physician has requested you wear a ZIO patch monitor for 14 days.  This is a single patch monitor. [employer] supplies one patch monitor per enrollment. Additional stickers are not available. Please do not apply patch if you will be having a Nuclear Stress Test,  Echocardiogram, Cardiac CT, MRI, or Chest Xray during the period you would be wearing the  monitor. The patch cannot be worn during these tests. You cannot remove and re-apply the  ZIO XT patch monitor.  Your ZIO patch monitor will be mailed 3 day USPS to your address on file. It may take 3-5 days  to receive your monitor after you have been enrolled.  Once you have received your monitor,  please review the enclosed instructions. Your monitor  has already been registered assigning a specific monitor serial # to you.  Billing and Patient Assistance Program Information  We have supplied [employer] with any of your insurance information on file for billing purposes. [employer] offers a sliding scale Patient Assistance Program for patients that do not have  insurance, or whose insurance does not completely cover the cost of the ZIO monitor.  You must apply for the Patient Assistance Program to qualify for this discounted rate.  To apply, please call [employer] at [phone], select option 4, select option 2, ask to apply for  Patient Assistance Program. Theodore Demark will ask your household income, and how many people  are in your household. They will quote your out-of-pocket cost based on that information.  [employer] will also be able to set up a 23-month, interest-free payment plan if needed.  Applying the monitor   Shave hair from upper left chest.  Hold abrader disc by orange tab. Rub abrader in 40 strokes over the upper left chest as  indicated in your monitor instructions.  Clean area with 4 enclosed alcohol pads. Let dry.  Apply patch as indicated in monitor instructions. Patch will be placed under collarbone on left  side of chest with arrow pointing upward.  Rub patch adhesive wings for 2 minutes. Remove white label marked "1". Remove the white  label marked "2". Rub patch adhesive wings for 2 additional minutes.  While looking in a mirror, press and release button in center of patch. A small green light will  flash 3-4 times. This will be your only indicator that the monitor has been turned on.  Do not shower for the first 24 hours. You may shower after the first 24 hours.  Press the button if you feel a symptom. You will hear a small click. Record Date, Time and  Symptom in the Patient Logbook.  When you are ready to remove the patch, follow instructions on the last 2 pages of  Patient  Logbook. Stick patch monitor onto the last page of Patient Logbook.  Place Patient Logbook in the blue and white box. Use locking tab on box and tape box closed  securely. The blue and white box has prepaid postage on it. Please place it in the mailbox as  soon as possible. Your physician should have your test results approximately 7 days after the  monitor has been mailed back to [facility].  Call [place] at [phone] if you have questions regarding  your ZIO XT patch monitor. Call them immediately if you see an orange light blinking on your  monitor.  If your monitor falls off in less than 4 days, contact our Monitor department at [phone].  If your monitor becomes loose or falls off after 4 days call [employer] at [phone] for  suggestions on securing your monitor.   Follow-Up: At [facility], you and your health needs are our priority.  As part of our continuing mission to provide you with exceptional heart care, we have created designated Provider Care Teams.  These Care Teams include your primary Cardiologist (physician) and Advanced Practice Providers (APPs -  Physician Assistants and Nurse Practitioners) who all work together to provide you with the care you need, when you need it.  We recommend signing up for the patient portal called "MyChart".  Sign up information is provided on this After Visit Summary.  MyChart is used to connect with patients for Virtual Visits (Telemedicine).  Patients are able to view lab/test results, encounter notes,  upcoming appointments, etc.  Non-urgent messages can be sent to your provider as well.   To learn more about what you can do with MyChart, go to [email or web address].    Your next appointment:   12 week(s)  The format for your next appointment:   In Person  Provider:   Berniece Salines, DO If MD is not listed, click here to update    :1}    Other Instructions     Adopting a Healthy  Lifestyle.  Know what a healthy weight is for you (roughly BMI <25) and aim to maintain this   Aim for 7+ servings of fruits and vegetables daily   65-80+ fluid ounces of water or unsweet tea for healthy kidneys   Limit to max 1 drink of alcohol per day; avoid smoking/tobacco   Limit animal fats in diet for cholesterol and heart health - choose grass fed whenever available   Avoid highly processed foods, and foods high in saturated/trans fats   Aim for low stress - take time to unwind and care for your mental health   Aim for 150 min of moderate intensity exercise weekly for heart health, and weights twice weekly for bone health   Aim for 7-9 hours of sleep daily   When it comes to diets, agreement about the perfect plan isnt easy to find, even among the experts. Experts at the [employer] developed an idea known as the Healthy Eating Plate. Just imagine a plate divided into logical, healthy portions.   The emphasis is on diet quality:   Load up on vegetables and fruits - one-half of your plate: Aim for color and variety, and remember that potatoes dont count.   Go for whole grains - one-quarter of your plate: Whole wheat, barley, wheat berries, quinoa, oats, brown rice, and foods made with them. If you want pasta, go with whole wheat pasta.   Protein power - one-quarter of your plate: Fish, chicken, beans, and nuts are all healthy, versatile protein sources. Limit red meat.   The diet, however, does go beyond the plate, offering a few other suggestions.   Use healthy plant oils, such as olive, canola, soy, corn, sunflower and peanut. Check the labels, and avoid partially hydrogenated oil, which have unhealthy trans fats.   If youre thirsty, drink water. Coffee and tea are good in moderation, but skip sugary drinks and limit milk and dairy products to one or two daily servings.   The type of carbohydrate in the diet is more important than the amount. Some  sources of carbohydrates, such as vegetables, fruits, whole grains, and beans-are healthier than others.   Finally, stay active  Signed, Berniece Salines, DO  07/04/2021 2:16 PM    [facility]

## 2021-07-04 NOTE — Patient Instructions (Addendum)
Medication Instructions:  Your physician recommends that you continue on your current medications as directed. Please refer to the Current Medication list given to you today.  *If you need a refill on your cardiac medications before your next appointment, please call your pharmacy*   Lab Work: Your physician recommends that you return for lab work in:  End of this week beginning of next week: BMET, Mag, CBC If you have labs (blood work) drawn today and your tests are completely normal, you will receive your results only by: [place] (if you have MyChart) OR A paper copy in the mail If you have any lab test that is abnormal or we need to change your treatment, we will call you to review the results.   Testing/Procedures: Your physician has requested that you have a TEE. During a TEE, sound waves are used to create images of your heart. It provides your doctor with information about the size and shape of your heart and how well your heart's chambers and valves are working. In this test, a transducer is attached to the end of a flexible tube that's guided down your throat and into your esophagus (the tube leading from you mouth to your stomach) to get a more detailed image of your heart. You are not awake for the procedure. Please see the instruction sheet given to you today. For further information please visit [email or web address].             PATIENT INSTRUCTIONS    CARDIOVERSION/TEE CARDIOVERSION     [facility] TOWER- MAIN ENTRANCE "A"     [place] AND TIME OF PROCEDURE: November 17th     Please register at the Admitting Department at   [place] ANYTHING after midnight prior to your procedure.  You should take your medications as usual with a sip of water.  If you are diabetic, DO NOT TAKE YOUR DIABETIC MEDICATIONS the morning OF THE PROCEDURE.  If you are taking Lanoxin (also called Digoxin), do NOT take this  medication the morning of the procedure.  DO NOT STOP any blood thinners that you may be taking.  Have your blood drawn as intructed.  You will need someone with you to drive you home after the procedure.  If you have any questions, please call our office at [phone].  ZIO XT- Long Term Monitor Instructions  Your physician has requested you wear a ZIO patch monitor for 14 days.  This is a single patch monitor. [employer] supplies one patch monitor per enrollment. Additional stickers are not available. Please do not apply patch if you will be having a Nuclear Stress Test,  Echocardiogram, Cardiac CT, MRI, or Chest Xray during the period you would be wearing the  monitor. The patch cannot be worn during these tests. You cannot remove and re-apply the  ZIO XT patch monitor.  Your ZIO patch monitor will be mailed 3 day USPS to your address on file. It may take 3-5 days  to receive your monitor after you have been enrolled.  Once you have received your monitor, please review the enclosed instructions. Your monitor  has already been registered assigning a specific monitor serial # to you.  Billing and Patient Assistance Program Information  We have supplied [employer] with any of your insurance information on file for billing purposes. [employer] offers a sliding scale Patient Assistance Program for patients that do not  have  insurance, or whose insurance does not completely cover the cost of the ZIO monitor.  You must apply for the Patient Assistance Program to qualify for this discounted rate.  To apply, please call [employer] at [phone], select option 4, select option 2, ask to apply for  Patient Assistance Program. Theodore Demark will ask your household income, and how many people  are in your household. They will quote your out-of-pocket cost based on that information.  [employer] will also be able to set up a 42-month, interest-free payment plan if needed.  Applying the monitor   Shave hair  from upper left chest.  Hold abrader disc by orange tab. Rub abrader in 40 strokes over the upper left chest as  indicated in your monitor instructions.  Clean area with 4 enclosed alcohol pads. Let dry.  Apply patch as indicated in monitor instructions. Patch will be placed under collarbone on left  side of chest with arrow pointing upward.  Rub patch adhesive wings for 2 minutes. Remove white label marked "1". Remove the white  label marked "2". Rub patch adhesive wings for 2 additional minutes.  While looking in a mirror, press and release button in center of patch. A small green light will  flash 3-4 times. This will be your only indicator that the monitor has been turned on.  Do not shower for the first 24 hours. You may shower after the first 24 hours.  Press the button if you feel a symptom. You will hear a small click. Record Date, Time and  Symptom in the Patient Logbook.  When you are ready to remove the patch, follow instructions on the last 2 pages of Patient  Logbook. Stick patch monitor onto the last page of Patient Logbook.  Place Patient Logbook in the blue and white box. Use locking tab on box and tape box closed  securely. The blue and white box has prepaid postage on it. Please place it in the mailbox as  soon as possible. Your physician should have your test results approximately 7 days after the  monitor has been mailed back to [facility].  Call [place] at [phone] if you have questions regarding  your ZIO XT patch monitor. Call them immediately if you see an orange light blinking on your  monitor.  If your monitor falls off in less than 4 days, contact our Monitor department at [phone].  If your monitor becomes loose or falls off after 4 days call [employer] at [phone] for  suggestions on securing your monitor.   Follow-Up: At [facility], you and your health needs are our priority.  As part of our continuing mission to  provide you with exceptional heart care, we have created designated Provider Care Teams.  These Care Teams include your primary Cardiologist (physician) and Advanced Practice Providers (APPs -  Physician Assistants and Nurse Practitioners) who all work together to provide you with the care you need, when you need it.  We recommend signing up for the patient portal called "MyChart".  Sign up information is provided on this After Visit Summary.  MyChart is used to connect with patients for Virtual Visits (Telemedicine).  Patients are able to view lab/test results, encounter notes, upcoming appointments, etc.  Non-urgent messages can be sent to your provider as well.   To learn more about what you can do with MyChart, go to [email or web address].    Your next appointment:   12 week(s)  The format for your next appointment:  In Person  Provider:   Berniece Salines, DO If MD is not listed, click here to update    :1}    Other Instructions

## 2021-07-05 ENCOUNTER — Encounter (HOSPITAL_COMMUNITY): Payer: Self-pay | Admitting: Cardiovascular Disease

## 2021-07-07 ENCOUNTER — Ambulatory Visit (INDEPENDENT_AMBULATORY_CARE_PROVIDER_SITE_OTHER): Payer: Self-pay | Admitting: Family Medicine

## 2021-07-07 ENCOUNTER — Other Ambulatory Visit: Payer: Self-pay | Admitting: Family Medicine

## 2021-07-07 ENCOUNTER — Encounter: Payer: Self-pay | Admitting: Family Medicine

## 2021-07-07 VITALS — BP 118/76 | HR 76 | Temp 98.4°F | Wt 196.0 lb

## 2021-07-07 DIAGNOSIS — F419 Anxiety disorder, unspecified: Secondary | ICD-10-CM

## 2021-07-07 DIAGNOSIS — I63411 Cerebral infarction due to embolism of right middle cerebral artery: Secondary | ICD-10-CM

## 2021-07-07 DIAGNOSIS — R29898 Other symptoms and signs involving the musculoskeletal system: Secondary | ICD-10-CM

## 2021-07-07 MED ORDER — LORAZEPAM 0.5 MG PO TABS: 0.5000 mg | ORAL_TABLET | Freq: Three times a day (TID) | ORAL | 1 refills | Status: DC | PRN

## 2021-07-07 NOTE — Progress Notes (Signed)
   Subjective:    Patient ID: Angela Hahn, female    DOB: [birth year], 45 y.o.   MRN: [id number]  HPI Here to follow up after a hospital stay from 06-23-21 to 06-25-21 for multiple tiny strokes in the right basal ganglia, insular cortex, and external capsule. She presented with a right frontal headache, lightheadedness, and weakness in the left arm and hand. Brain MRA revealed patent neck and cerebral arteries. 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 She was sent home on Plavix and ASA for 3 weeks, after which she will continue on ASA alone. She was seen here on [id number] and an order was placed for PT. Since then she still has some left arm and hand weakness, and she decribes a general sense of dread that something else bad will happen to her. She has a lot of anxiety, particularly when she is in a group of people. She was prescribed Prozac to take, but she is afraid of this and refuses to try it.    Review of Systems  Constitutional: Negative.   Respiratory: Negative.    Cardiovascular: Negative.   Neurological:  Positive for weakness and light-headedness. Negative for speech difficulty, numbness and headaches.  Psychiatric/Behavioral:  Negative for agitation, confusion, decreased concentration and dysphoric mood. The patient is not nervous/anxious.       Objective:   Physical Exam Constitutional:      Appearance: Normal appearance.     Comments: In a wheelchair   Cardiovascular:     Rate and Rhythm: Normal rate and regular rhythm.     Pulses: Normal pulses.     Heart sounds: Normal heart sounds.  Pulmonary:     Effort: Pulmonary effort is normal.     Breath sounds: Normal breath sounds.  Neurological:     Mental Status: She is alert and oriented to person, place, and time.     Cranial Nerves: No cranial nerve deficit.     Gait: Gait normal.     Comments: Strength is symmetrical in both hands           Assessment & Plan:  She is  recovering from a series of strokes. She will have a TEE on 07-14-21. She will follow up with Dr. Antony Contras (Neurology) on 07-27-21. She has PT pending and we will also order OT for her. She has developed some anxiety, so she will try Lorazepam 0.5 mg to use as needed. We spent a total of ( 34  ) minutes reviewing records and discussing these issues.  Alysia Penna, MD

## 2021-07-09 LAB — BASIC METABOLIC PANEL
BUN/Creatinine Ratio: 14 (ref 9–23)
BUN: 10 mg/dL (ref 6–24)
CO2: 23 mmol/L (ref 20–29)
Calcium: 9.6 mg/dL (ref 8.7–10.2)
Chloride: 107 mmol/L — ABNORMAL HIGH (ref 96–106)
Creatinine, Ser: 0.74 mg/dL (ref 0.57–1.00)
Glucose: 102 mg/dL — ABNORMAL HIGH (ref 70–99)
Potassium: 4.5 mmol/L (ref 3.5–5.2)
Sodium: 141 mmol/L (ref 134–144)
eGFR: 100 mL/min/{1.73_m2} (ref >59)

## 2021-07-09 LAB — CBC WITH DIFFERENTIAL/PLATELET
Basophils Absolute: 0.1 10*3/uL (ref 0.0–0.2)
Basos: 1 %
EOS (ABSOLUTE): 0.2 10*3/uL (ref 0.0–0.4)
Eos: 5 %
Hematocrit: 33.1 % — ABNORMAL LOW (ref 34.0–46.6)
Hemoglobin: 10.8 g/dL — ABNORMAL LOW (ref 11.1–15.9)
Immature Grans (Abs): 0 10*3/uL (ref 0.0–0.1)
Immature Granulocytes: 0 %
Lymphocytes Absolute: 1.2 10*3/uL (ref 0.7–3.1)
Lymphs: 27 %
MCH: 24.3 pg — ABNORMAL LOW (ref 26.6–33.0)
MCHC: 32.6 g/dL (ref 31.5–35.7)
MCV: 74 fL — ABNORMAL LOW (ref 79–97)
Monocytes Absolute: 0.4 10*3/uL (ref 0.1–0.9)
Monocytes: 10 %
Neutrophils Absolute: 2.5 10*3/uL (ref 1.4–7.0)
Neutrophils: 57 %
Platelets: 287 10*3/uL (ref 150–450)
RBC: 4.45 x10E6/uL (ref 3.77–5.28)
RDW: 12.9 % (ref 11.7–15.4)
WBC: 4.3 10*3/uL (ref 3.4–10.8)

## 2021-07-09 LAB — MAGNESIUM: Magnesium: 1.7 mg/dL (ref 1.6–2.3)

## 2021-07-13 NOTE — Addendum Note (Signed)
Addended by: Berniece Salines on: 07/13/2021 12:20 PM   Modules accepted: Orders, SmartSet

## 2021-07-14 ENCOUNTER — Ambulatory Visit (HOSPITAL_BASED_OUTPATIENT_CLINIC_OR_DEPARTMENT_OTHER)
Admission: RE | Admit: 2021-07-14 | Discharge: 2021-07-14 | Disposition: A | Discharge status: Home / Self Care | Payer: Self-pay | Source: Ambulatory Visit | Attending: Cardiology | Admitting: Cardiology

## 2021-07-14 ENCOUNTER — Encounter (HOSPITAL_COMMUNITY)
Admission: RE | Disposition: A | Discharge status: Home / Self Care | Payer: Self-pay | Source: Home / Self Care | Attending: Cardiovascular Disease

## 2021-07-14 ENCOUNTER — Ambulatory Visit (HOSPITAL_COMMUNITY)
Admission: RE | Admit: 2021-07-14 | Discharge: 2021-07-14 | Disposition: A | Discharge status: Home / Self Care | Payer: Self-pay | Attending: Cardiovascular Disease | Admitting: Cardiovascular Disease

## 2021-07-14 ENCOUNTER — Telehealth: Payer: Self-pay | Admitting: Family Medicine

## 2021-07-14 ENCOUNTER — Encounter (HOSPITAL_COMMUNITY): Payer: Self-pay | Admitting: Cardiovascular Disease

## 2021-07-14 ENCOUNTER — Other Ambulatory Visit: Payer: Self-pay

## 2021-07-14 ENCOUNTER — Ambulatory Visit (HOSPITAL_COMMUNITY): Payer: Self-pay | Admitting: Certified Registered"

## 2021-07-14 DIAGNOSIS — I639 Cerebral infarction, unspecified: Secondary | ICD-10-CM | POA: Insufficient documentation

## 2021-07-14 DIAGNOSIS — E782 Mixed hyperlipidemia: Secondary | ICD-10-CM | POA: Insufficient documentation

## 2021-07-14 DIAGNOSIS — Z7982 Long term (current) use of aspirin: Secondary | ICD-10-CM | POA: Insufficient documentation

## 2021-07-14 DIAGNOSIS — I4891 Unspecified atrial fibrillation: Secondary | ICD-10-CM

## 2021-07-14 DIAGNOSIS — Z7902 Long term (current) use of antithrombotics/antiplatelets: Secondary | ICD-10-CM | POA: Insufficient documentation

## 2021-07-14 HISTORY — PX: TEE WITHOUT CARDIOVERSION: SHX5443

## 2021-07-14 HISTORY — PX: BUBBLE STUDY: SHX6837

## 2021-07-14 SURGERY — ECHOCARDIOGRAM, TRANSESOPHAGEAL
Anesthesia: Monitor Anesthesia Care

## 2021-07-14 MED ORDER — SODIUM CHLORIDE 0.9 % IV SOLN: INTRAVENOUS | Status: DC

## 2021-07-14 MED ORDER — GLYCOPYRROLATE 0.2 MG/ML IJ SOLN
INTRAMUSCULAR | Status: DC | PRN
  Administered 2021-07-14 (×2): .1 mg via INTRAVENOUS

## 2021-07-14 MED ORDER — LIDOCAINE HCL URETHRAL/MUCOSAL 2 % EX GEL
CUTANEOUS | Status: DC | PRN
  Administered 2021-07-14: 1 via TOPICAL

## 2021-07-14 MED ORDER — LIDOCAINE VISCOUS HCL 2 % MT SOLN
OROMUCOSAL | Status: AC
  Filled 2021-07-14: qty 15

## 2021-07-14 MED ORDER — PROPOFOL 10 MG/ML IV BOLUS
INTRAVENOUS | Status: DC | PRN
  Administered 2021-07-14: 20 mg via INTRAVENOUS
  Administered 2021-07-14: 40 mg via INTRAVENOUS
  Administered 2021-07-14 (×2): 20 mg via INTRAVENOUS

## 2021-07-14 MED ORDER — PROPOFOL 500 MG/50ML IV EMUL
INTRAVENOUS | Status: DC | PRN
  Administered 2021-07-14: 75 ug/kg/min via INTRAVENOUS

## 2021-07-14 MED ORDER — LIDOCAINE HCL (CARDIAC) PF 100 MG/5ML IV SOSY
PREFILLED_SYRINGE | INTRAVENOUS | Status: DC | PRN
  Administered 2021-07-14: 50 mg via INTRAVENOUS

## 2021-07-14 NOTE — Interval H&P Note (Signed)
History and Physical Interval Note:  07/14/2021 8:20 AM  Angela Hahn  has presented today for surgery, with the diagnosis of CEREBRAL VASCULAR ACCIDENT-CVA.  The various methods of treatment have been discussed with the patient and family. After consideration of risks, benefits and other options for treatment, the patient has consented to  Procedure(s): TRANSESOPHAGEAL ECHOCARDIOGRAM (TEE) (N/A) as a surgical intervention.  The patient's history has been reviewed, patient examined, no change in status, stable for surgery.  I have reviewed the patient's chart and labs.  Questions were answered to the patient's satisfaction.     Skeet Latch, MD

## 2021-07-14 NOTE — Telephone Encounter (Signed)
Pt is calling and was in hosp on 06-23-2021 due to stroke  and was d/c with only 19 days of plavix 75 mg. Pt had heart procedure today and her heart is great. Pt would like to know if she should continue to take plavix if so please send refill  [facility] [id number], [place] Phone:  [phone]  Fax:  [phone]

## 2021-07-14 NOTE — Transfer of Care (Signed)
Immediate Anesthesia Transfer of Care Note  Patient: Angela Hahn  Procedure(s) Performed: TRANSESOPHAGEAL ECHOCARDIOGRAM (TEE) BUBBLE STUDY  Patient Location: PACU  Anesthesia Type:MAC  Level of Consciousness: awake, alert  and oriented  Airway & Oxygen Therapy: Patient Spontanous Breathing and Patient connected to nasal cannula oxygen  Post-op Assessment: Report given to RN and Post -op Vital signs reviewed and stable  Post vital signs: Reviewed and stable  Last Vitals:  Vitals Value Taken Time  BP 120/82 07/14/21 0851  Temp    Pulse 104 07/14/21 0852  Resp 18 07/14/21 0852  SpO2 100 % 07/14/21 0852  Vitals shown include unvalidated device data.  Last Pain:  Vitals:   07/14/21 0717  TempSrc: Other (Comment)  PainSc: 0-No pain         Complications: No notable events documented.

## 2021-07-14 NOTE — Telephone Encounter (Signed)
Pharmacy updated.   Please advise 

## 2021-07-14 NOTE — Anesthesia Preprocedure Evaluation (Addendum)
Anesthesia Evaluation  Patient identified by MRN, date of birth, ID band Patient awake    Reviewed: Allergy & Precautions, NPO status , Patient's Chart, lab work & pertinent test results  Airway Mallampati: II  TM Distance: >3 FB Neck ROM: Full    Dental  (+) Dental Advisory Given, Teeth Intact   Pulmonary neg pulmonary ROS, former smoker,    Pulmonary exam normal breath sounds clear to auscultation       Cardiovascular Normal cardiovascular exam+ Valvular Problems/Murmurs  Rhythm:Regular Rate:Normal  Echo 05/2021 1. Left ventricular ejection fraction, by estimation, is 60 to 65%. The left ventricle has normal function. The left ventricle has no regional wall motion abnormalities. Left ventricular diastolic parameters were normal.  2. Right ventricular systolic function is normal. The right ventricular size is normal.  3. The mitral valve is grossly normal. Trivial mitral valve  regurgitation.  4. The aortic valve is normal in structure. Aortic valve regurgitation is not visualized. No aortic stenosis is present.    Neuro/Psych  Headaches, PSYCHIATRIC DISORDERS Anxiety CVA, Residual Symptoms    GI/Hepatic negative GI ROS, Neg liver ROS,   Endo/Other  negative endocrine ROS  Renal/GU negative Renal ROS     Musculoskeletal negative musculoskeletal ROS (+)   Abdominal (+) + obese,   Peds  Hematology negative hematology ROS (+) anemia ,   Anesthesia Other Findings   Reproductive/Obstetrics                           Anesthesia Physical Anesthesia Plan  ASA: 3  Anesthesia Plan: MAC   Post-op Pain Management:    Induction: Intravenous  PONV Risk Score and Plan: 2 and Ondansetron, Propofol infusion, Treatment may vary due to age or medical condition, TIVA and Midazolam  Airway Management Planned: Natural Airway  Additional Equipment:   Intra-op Plan:   Post-operative Plan:    Informed Consent: I have reviewed the patients History and Physical, chart, labs and discussed the procedure including the risks, benefits and alternatives for the proposed anesthesia with the patient or authorized representative who has indicated his/her understanding and acceptance.     Dental advisory given  Plan Discussed with: CRNA  Anesthesia Plan Comments:        Anesthesia Quick Evaluation

## 2021-07-14 NOTE — CV Procedure (Signed)
Brief TEE Note  LVEF 60-65% No LA/LAA thrombus or mass No atrial shunting by saline microcavitation study Trivial MR.  Trivial TR.  For additional details see full report.   Damein Gaunce C. Oval Linsey, MD, [facility] 07/14/2021 8:48 AM

## 2021-07-14 NOTE — Anesthesia Postprocedure Evaluation (Signed)
Anesthesia Post Note  Patient: Angela Hahn  Procedure(s) Performed: TRANSESOPHAGEAL ECHOCARDIOGRAM (TEE) BUBBLE STUDY     Patient location during evaluation: PACU Anesthesia Type: MAC Level of consciousness: awake and alert Pain management: pain level controlled Vital Signs Assessment: post-procedure vital signs reviewed and stable Respiratory status: spontaneous breathing Cardiovascular status: stable Anesthetic complications: no   No notable events documented.  Last Vitals:  Vitals:   07/14/21 0851 07/14/21 0901  BP:  117/74  Pulse:    Resp:    Temp: 36.7 C   SpO2:      Last Pain:  Vitals:   07/14/21 0851  TempSrc:   PainSc: 0-No pain                 Nolon Nations

## 2021-07-14 NOTE — Progress Notes (Signed)
  Echocardiogram Echocardiogram Transesophageal has been performed.  Fidel Levy 07/14/2021, 9:12 AM

## 2021-07-15 ENCOUNTER — Telehealth: Payer: Self-pay

## 2021-07-15 ENCOUNTER — Encounter (HOSPITAL_COMMUNITY): Payer: Self-pay | Admitting: Cardiovascular Disease

## 2021-07-15 ENCOUNTER — Other Ambulatory Visit: Payer: Self-pay

## 2021-07-15 NOTE — Telephone Encounter (Signed)
She was to take Plavix with aspirin for a total of 21 days, which is now over. She does NOT need any more Plavix. She is to take aspirin alone from now on. Also for anxiety, she may take 2 tablets of Lorazepam at a time (a total of 1 mg) as needed

## 2021-07-15 NOTE — Telephone Encounter (Signed)
Spoke with pt verbalized understanding of Dr Sarajane Jews advise/ recommendations

## 2021-07-15 NOTE — Telephone Encounter (Signed)
See message.

## 2021-07-15 NOTE — Telephone Encounter (Signed)
Pt is out of plavix

## 2021-07-15 NOTE — Telephone Encounter (Signed)
See the other message today

## 2021-07-18 ENCOUNTER — Other Ambulatory Visit: Payer: Self-pay

## 2021-07-18 ENCOUNTER — Encounter: Payer: Self-pay | Admitting: Occupational Therapy

## 2021-07-18 ENCOUNTER — Ambulatory Visit: Payer: Medicaid Other | Attending: Family Medicine | Admitting: Occupational Therapy

## 2021-07-18 ENCOUNTER — Telehealth: Payer: Self-pay | Admitting: Occupational Therapy

## 2021-07-18 DIAGNOSIS — R278 Other lack of coordination: Secondary | ICD-10-CM | POA: Insufficient documentation

## 2021-07-18 DIAGNOSIS — M6281 Muscle weakness (generalized): Secondary | ICD-10-CM | POA: Insufficient documentation

## 2021-07-18 DIAGNOSIS — I69852 Hemiplegia and hemiparesis following other cerebrovascular disease affecting left dominant side: Secondary | ICD-10-CM | POA: Insufficient documentation

## 2021-07-18 DIAGNOSIS — I63411 Cerebral infarction due to embolism of right middle cerebral artery: Secondary | ICD-10-CM

## 2021-07-18 NOTE — Telephone Encounter (Signed)
Dr. Sarajane Jews,  I saw Ms. Widrig today for an occupational therapy evaluation. She is reporting dizziness when getting up and in busy areas and feeling unsteady with gait. I think she would benefit from a Physical Therapy evaluation. If you agree, would you place a referral for PT to our clinic ([facility] on [address])?  Thank you so much, Waldo Laine, MOT, OTR/L

## 2021-07-18 NOTE — Therapy (Addendum)
[facility] [address] [place], [place], [address] Phone: [phone]   Fax:  [phone]  Occupational Therapy Evaluation  Patient Details  Name: Angela Hahn MRN: [id number] Date of Birth: 03-Oct-1975 Referring Provider (OT): Alysia Penna, MD   Encounter Date: 07/18/2021   OT End of Session - 07/18/21 1001     Visit Number 1    Number of Visits 13    Date for OT Re-Evaluation 09/16/20    Authorization Type [employer] per patient - not on file    OT Start Time [phone]    OT Stop Time 1015    OT Time Calculation (min) 41 min    Activity Tolerance Patient tolerated treatment well    Behavior During Therapy [facility] for tasks assessed/performed             Past Medical History:  Diagnosis Date   Anemia    Chronic headaches    Dysmenorrhea    Heart murmur    dx'd since childhood    Past Surgical History:  Procedure Laterality Date   BUBBLE STUDY  07/14/2021   Procedure: BUBBLE STUDY;  Surgeon: Skeet Latch, MD;  Location: [facility];  Service: Cardiovascular;;   TEE WITHOUT CARDIOVERSION N/A 07/14/2021   Procedure: TRANSESOPHAGEAL ECHOCARDIOGRAM (TEE);  Surgeon: Skeet Latch, MD;  Location: [facility];  Service: Cardiovascular;  Laterality: N/A;   TUBAL LIGATION      There were no vitals filed for this visit.   Subjective Assessment - 07/18/21 1004     Subjective  Pt is a 45 year old female that presents to Neuro OPOT s/p CVA on 06/23/21. MRI showed right insular cortex small diffusion abnormalities however neurology await 2nd MRI as this does not explain RUE weakness. On 10/28 patient woke up with LUE weakness and MRI revealed acute/subacute nonhemorrhagic infarct  involving the right insular cortex and external capsule. Stable appearance of acute/subacute nonhemorrhagic infarcts in the anterior frontal lobes, bilaterally. Pt reports primary concern with "spinning" when she gets up in the morning and  left hand weakness. Pt lives with her 48 year old son and they have a small dog on a second level apartment.    Pertinent History Anemia    Limitations Fall Risk, Loop Recorder    Patient Stated Goals "hand strength back and use left hand completely    Currently in Pain? No/denies    Pain Score 0-No pain               [facility] OT Assessment - 07/18/21 0940       Assessment   Medical Diagnosis CVA    Referring Provider (OT) Alysia Penna, MD    Onset Date/Surgical Date 06/23/21    Hand Dominance Left    Prior Therapy none      Precautions   Precautions Fall    Precaution Comments loop recorder      Balance Screen   Has the patient fallen in the past 6 months No      Home  Environment   Family/patient expects to be discharged to: Private residence    Living Arrangements Children   son (24) + small dog   Available Help at Discharge Family    Type of [place]   2nd level apartment   Philip to live on main level with bedroom/bathroom    Bathroom Shower/Tub Tub/Shower unit    Bathroom Accessibility Yes    Home Equipment None  Prior Function   Level of Independence Independent    Vocation Full time employment   hasn't been back   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 Modified independent      IADL   Prior Level of Function Shopping independent    Shopping Needs to be accompanied on any shopping trip    [place] All laundry must be done by others;Performs light daily tasks  but cannot maintain acceptable level of cleanliness   son is helping with laundry   Meal Prep Plans, prepares and serves adequate meals independently   takes breaks d/t fatigue   Community Mobility Relies on family or friends for transportation      Written Expression   Dominant Hand Left    Handwriting 75% legible   with increased time     Vision - History   Baseline Vision Wears glasses for distance only    Additional Comments pt reported it was off at first but it is getting better      Observation/Other Assessments   Focus on Therapeutic Outcomes (FOTO)  N/A not captured on eval at front desk      Sensation   Light Touch Appears Intact    Hot/Cold Appears Intact      Coordination   Gross Motor Movements are Fluid and Coordinated No    Fine Motor Movements are Fluid and Coordinated No    9 Hole Peg Test Right;Left    Right 9 Hole Peg Test 20.25s    Left 9 Hole Peg Test 45.66s    Other Typing Test 15 wpm, 60% accuracy, 9 wpm net speed    Coordination impaired on LUE      ROM / Strength   AROM / PROM / Strength AROM;Strength      AROM   Overall AROM  Within functional limits for tasks performed      Strength   Overall Strength Deficits    Overall Strength Comments Deficits LUE    Strength Assessment Site Shoulder;Elbow    Right/Left Shoulder Left    Left Shoulder Flexion 4-/5    Left Shoulder ABduction 4/5    Right/Left Elbow Left    Left Elbow Flexion 4/5    Left Elbow Extension 4/5      Hand Function   Right Hand Gross Grasp Functional    Right Hand Grip (lbs) 74.2    Left Hand Gross Grasp Functional    Left Hand Grip (lbs) 52.4                                OT Short Term Goals - 07/18/21 1037       OT SHORT TERM GOAL #1   Title Pt will be independent with HEP targeting grip strength and HEP targeting coordination    Baseline --    Time 4    Period Weeks    Status New  Target Date 08/15/21      OT SHORT TERM GOAL #2   Title  Pt will demonsrate increased legibility and handwriting speed by writing 2-3 sentences with 90% legibility and in 3 minutes or less.    Baseline increased time and effort for writing name at eval    Time 4    Period Weeks    Status New      OT SHORT TERM GOAL #3   Title Pt will be able to fix hair without assistance    Baseline unable to do hair    Time 4    Period Weeks    Status New      OT SHORT TERM GOAL #4   Title Pt will complete 9 hole peg test with LUE in 40 seconds or less.    Baseline R 45.66s, L 20.25s    Time 4    Period Weeks    Status New               OT Long Term Goals - 07/18/21 1047       OT LONG TERM GOAL #1   Title Pt will be independent with HEP targeting proximal strength    Baseline --    Time 8    Period Weeks    Status New    Target Date 09/12/21      OT LONG TERM GOAL #2   Title Pt will increase typing speed to 15 wpm net speed.    Baseline 9 wpm net speed    Time 8    Period Weeks    Status New      OT LONG TERM GOAL #3   Title Pt will verbalize return to driving recommendations    Time 8    Period Weeks    Status New      OT LONG TERM GOAL #4   Title Pt will verbalize return to work recommendations    Time 8    Period Weeks    Status New      OT LONG TERM GOAL #5   Title Pt will increase grip strength in LUE to 62 lbs or greater.    Baseline R 74.2, L 52.4    Time 8    Period Weeks    Status New      Long Term Additional Goals   Additional Long Term Goals Yes      OT LONG TERM GOAL #6   Title Pt will increase coordination in LUE to completing 9 hole peg test in 35 seconds or less.    Baseline R 20.25s, L 45.66s    Time 8    Period Weeks    Status New                   Plan - 07/18/21 1003     Clinical Impression Statement Pt is a 45 year old female that presents to Neuro OPOT s/p CVA on 06/23/21. MRI showed right insular cortex small diffusion abnormalities however neurology await 2nd MRI as this does  not explain RUE weakness. On 10/28 patient woke up with LUE weakness and MRI revealed acute/subacute nonhemorrhagic infarct involving the right insular cortex and external capsule. Stable appearance of acute/subacute nonhemorrhagic infarcts in the anterior frontal lobes, bilaterally. Pt presents today with LUE weakness and decreased coordination and unsteadiness on feet. Skilled occupational therapy is recommended to target listed areas of deficit and increase independence with ADLs and IADLs.    OT Occupational  Profile and History Problem Focused Assessment - Including review of records relating to presenting problem    Occupational performance deficits (Please refer to evaluation for details): ADL's;IADL's    Body Structure / Function / Physical Skills ADL;Strength;Decreased knowledge of use of DME;Dexterity;GMC;IADL;ROM;UE functional use;FMC;Coordination;Flexibility    Rehab Potential Good    Clinical Decision Making Limited treatment options, no task modification necessary    Comorbidities Affecting Occupational Performance: None    Modification or Assistance to Complete Evaluation  No modification of tasks or assist necessary to complete eval    OT Frequency 2x / week    OT Duration 8 weeks   12 visits over 8 weeks for any scheduling conflicts   OT Treatment/Interventions Self-care/ADL training;DME and/or AE instruction;Fluidtherapy;Splinting;Therapeutic activities;Ultrasound;Therapeutic exercise;Functional Mobility Training;Neuromuscular education;Patient/family education;Manual Therapy;Electrical Stimulation;Passive range of motion;Cryotherapy    Plan coordination HEP, grip strength (theraputty), handwriting, typing    OT Home Exercise Plan issued foam grip for handwriting    Recommended Other Services sent telephone encounter re: PT referral    Consulted and Agree with Plan of Care Patient             Patient will benefit from skilled therapeutic intervention in order to improve the  following deficits and impairments:   Body Structure / Function / Physical Skills: ADL, Strength, Decreased knowledge of use of DME, Dexterity, GMC, IADL, ROM, UE functional use, FMC, Coordination, Flexibility       Visit Diagnosis: Other lack of coordination - Plan: Ot plan of care cert/re-cert  Muscle weakness (generalized) - Plan: Ot plan of care cert/re-cert  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 [place], [place], [address] Phone: [phone]   Fax:  [phone]  Name: Angela Hahn MRN: [id number] Date of Birth: 1976/06/06

## 2021-07-19 ENCOUNTER — Telehealth: Payer: Self-pay | Admitting: Family Medicine

## 2021-07-19 NOTE — Telephone Encounter (Signed)
Spoke with Jenny Reichmann with [facility] regarding request for clearance on pt for her dental work, advised that once we receive the form Dr Sarajane Jews will complete next week when he is back in the office and will fax back the form to pt dental office

## 2021-07-19 NOTE — Telephone Encounter (Signed)
Opal Sidles from [employer] called because the Dentist needs medical clearance for dental services due to prior conditions. She will fax over a form that will detail everything needed. I did let them know that he is out of the office due to family emergency but will be back next week on the 29th.    Good callback number is [phone]    Please advise

## 2021-07-25 ENCOUNTER — Ambulatory Visit: Payer: Medicaid Other | Admitting: Occupational Therapy

## 2021-07-26 ENCOUNTER — Telehealth: Payer: Self-pay | Admitting: Family Medicine

## 2021-07-26 ENCOUNTER — Other Ambulatory Visit: Payer: Self-pay

## 2021-07-26 MED ORDER — ATORVASTATIN CALCIUM 40 MG PO TABS: 40.0000 mg | ORAL_TABLET | Freq: Every day | ORAL | 0 refills | Status: DC

## 2021-07-26 NOTE — Telephone Encounter (Signed)
Rx sent to pt pharmacy 

## 2021-07-26 NOTE — Telephone Encounter (Signed)
I agree. I put in the referral for her. Thanks

## 2021-07-26 NOTE — Telephone Encounter (Signed)
Patient called to schedule an appointment for her hand and also mentioned that she is needing a refill on her cholesterol medication.  Scheduled patient for a visit with Dr. Elease Hashimoto for tomorrow for hand pain.

## 2021-07-26 NOTE — Addendum Note (Signed)
Addended by: Alysia Penna A on: 07/26/2021 06:30 AM   Modules accepted: Orders

## 2021-07-27 ENCOUNTER — Encounter: Payer: Self-pay | Admitting: Family Medicine

## 2021-07-27 ENCOUNTER — Encounter: Payer: Self-pay | Admitting: Neurology

## 2021-07-27 ENCOUNTER — Ambulatory Visit: Payer: Self-pay | Admitting: Neurology

## 2021-07-27 ENCOUNTER — Other Ambulatory Visit: Payer: Self-pay

## 2021-07-27 ENCOUNTER — Ambulatory Visit (INDEPENDENT_AMBULATORY_CARE_PROVIDER_SITE_OTHER): Payer: Self-pay | Admitting: Family Medicine

## 2021-07-27 VITALS — BP 130/79 | HR 101 | Ht 66.0 in | Wt 237.5 lb

## 2021-07-27 VITALS — BP 136/84 | HR 85 | Temp 98.3°F | Resp 18 | Wt 237.0 lb

## 2021-07-27 DIAGNOSIS — E785 Hyperlipidemia, unspecified: Secondary | ICD-10-CM

## 2021-07-27 DIAGNOSIS — I639 Cerebral infarction, unspecified: Secondary | ICD-10-CM

## 2021-07-27 DIAGNOSIS — M79641 Pain in right hand: Secondary | ICD-10-CM

## 2021-07-27 DIAGNOSIS — R29898 Other symptoms and signs involving the musculoskeletal system: Secondary | ICD-10-CM

## 2021-07-27 DIAGNOSIS — Z9189 Other specified personal risk factors, not elsewhere classified: Secondary | ICD-10-CM

## 2021-07-27 DIAGNOSIS — R202 Paresthesia of skin: Secondary | ICD-10-CM

## 2021-07-27 LAB — HEPATIC FUNCTION PANEL
ALT: 27 U/L (ref 0–35)
AST: 21 U/L (ref 0–37)
Albumin: 4.1 g/dL (ref 3.5–5.2)
Alkaline Phosphatase: 67 U/L (ref 39–117)
Bilirubin, Direct: 0.1 mg/dL (ref 0.0–0.3)
Total Bilirubin: 0.4 mg/dL (ref 0.2–1.2)
Total Protein: 7.7 g/dL (ref 6.0–8.3)

## 2021-07-27 LAB — LIPID PANEL
Cholesterol: 163 mg/dL (ref 0–200)
HDL: 71.8 mg/dL (ref >39.00)
LDL Cholesterol: 78 mg/dL (ref 0–99)
NonHDL: 91.11
Total CHOL/HDL Ratio: 2
Triglycerides: 68 mg/dL (ref 0.0–149.0)
VLDL: 13.6 mg/dL (ref 0.0–40.0)

## 2021-07-27 NOTE — Progress Notes (Signed)
[facility] [place]. [place] [address] [phone]       OFFICE CONSULT NOTE  [name] Date of Birth:  09-03-1975 Medical Record Number:  [id number]   Referring MD:  Rosalin Hawking  Reason for Referral: Stroke  HPI: [name] is a 45 year old African-American lady seen today for initial office consultation visit for stroke.  History is obtained from the patient, her aunt and daughter-in-law accompanying her and review of electronic medical records and opossum reviewed pertinent available imaging films in PACS. Patient has no significant past medical history except for anemia, dysmenorrhea chronic headaches.  She presented on 06/23/2021 with episode of lightheadedness and feeling about to faint when getting out of a restaurant.  This was transient and she felt all right.  She went home and overnight did all okay.  When she woke up she noticed lightheaded again with some palpitations chest pain.  She also had trouble opening the door not with her right hand and was unable to turn the doorknob.  She presented to the ER for evaluation at that time she complained of throbbing left frontal headache and some nausea.  CT scan that was unremarkable.  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  2D echo was unremarkable TEE showed no cardiac source of embolism or PFO.  TCD bubble study was negative.  Lower extremity venous Dopplers negative for DVT.  LDL cholesterol is 95 mg percent.  Hemoglobin A1c was 6.3.  Urine drug screen was negative.  ESR and ANA panel were negative.  Hypercoagulable panel lab was mostly negative except for isolated elevated IgM antiphospholipid antibody of 39 and slightly diminished protein S activity and antigen levels.  Patient denies any prior history of deep vein thrombosis,  pulmonary embolism or recurrent miscarriages.  She had been on birth control pills for a year as well as on weight loss pills phentermine for 5 months.  She has since discontinued these.  She was discharged on aspirin and Plavix for 3 weeks and she is currently on aspirin alone.  She was also started on Lipitor which is tolerating well without muscle aches and pains.  Her blood pressures under good control and today it is 130/79.  Patient states that her left hand weakness and clumsiness has improved though she still has some diminished fine motor skills.  She has no prior history of strokes, TIAs, migraines, palpitations, loss of consciousness, syncope, cardiac disease. ROS:   14 system review of systems is positive for weakness, numbness all other systems negative  PMH:  Past Medical History:  Diagnosis Date   Anemia    Chronic headaches    Dysmenorrhea    Heart murmur    dx'd since childhood    Social History:  Social History   Socioeconomic History   Marital status: Single    Spouse name: Not on file   Number of children: Not on file   Years of education: Not on file   Highest education level: Not on file  Occupational History   Not on file  Tobacco Use   Smoking status: Former    Types: Cigarettes    Quit date: 05/28/2014    Years since quitting: 7.1   Smokeless tobacco: Never  Vaping Use   Vaping Use: Never used  Substance and  Sexual Activity   Alcohol use: No    Alcohol/week: 0.0 standard drinks   Drug use: No   Sexual activity: Yes    Partners: Male    Birth control/protection: Surgical    Comment: Tubal  Other Topics Concern   Not on file  Social History Narrative   Not on file   Social Determinants of Health   Financial Resource Strain: Not on file  Food Insecurity: Not on file  Transportation Needs: Not on file  Physical Activity: Not on file  Stress: Not on file  Social Connections: Not on file  Intimate Partner Violence: Not on file    Medications:    Current Outpatient Medications on File Prior to Visit  Medication Sig Dispense Refill   acetaminophen (TYLENOL) 500 MG tablet Take 1,000 mg by mouth every 6 (six) hours as needed for mild pain or headache.     atorvastatin (LIPITOR) 40 MG tablet Take 1 tablet (40 mg total) by mouth daily. 30 tablet 0   LORazepam (ATIVAN) 0.5 MG tablet Take 1 tablet (0.5 mg total) by mouth every 8 (eight) hours as needed for anxiety. 90 tablet 1   triamcinolone cream (KENALOG) 0.1 % Apply 1 application topically 2 (two) times daily. (Patient taking differently: Apply 1 application topically 2 (two) times daily as needed (eczema flare up).) 15 g 2   [DISCONTINUED] Cetirizine HCl 10 MG CAPS Take 1 capsule (10 mg total) by mouth daily. 15 capsule 0   No current facility-administered medications on file prior to visit.    Allergies:   Allergies  Allergen Reactions   Gadolinium Derivatives Itching, Swelling and Cough   Hydrocodone Other (See Comments)    Itching, takes Benadryl with this medicine   Tramadol Other (See Comments)    headache    Physical Exam General: Mildly obese middle-aged African-American lady, seated, in no evident distress Head: head normocephalic and atraumatic.   Neck: supple with no carotid or supraclavicular bruits Cardiovascular: regular rate and rhythm, no murmurs Musculoskeletal: no deformity Skin:  no rash/petichiae Vascular:  Normal pulses all extremities  Neurologic Exam Mental Status: Awake and fully alert. Oriented to place and time. Recent and remote memory intact. Attention span, concentration and fund of knowledge appropriate. Mood and affect appropriate.  Cranial Nerves: Fundoscopic exam reveals sharp disc margins. Pupils equal, briskly reactive to light. Extraocular movements full without nystagmus. Visual fields full to confrontation. Hearing intact. Facial sensation intact. Face, tongue, palate moves normally and symmetrically.  Motor: Normal bulk and tone. Normal  strength in all tested extremity muscles.  Diminished fine finger movements on the left.  Orbits right over left upper extremity. Sensory.: intact to touch , pinprick , position and vibratory sensation.  Coordination: Rapid alternating movements normal in all extremities. Finger-to-nose and heel-to-shin performed accurately bilaterally. Gait and Station: Arises from chair without difficulty. Stance is normal. Gait demonstrates normal stride length and balance . Able to heel, toe and tandem walk without difficulty.  Reflexes: 1+ and symmetric. Toes downgoing.   NIHSS  0 Modified [place]  2   ASSESSMENT: 45 year old lady with insular infarct and October 2022 of cryptogenic etiology.  Vascular risk factors of obesity, at risk for sleep apnea, hyperlipidemia and recent use of birth control pills and phentermine for weight loss.     PLAN:I had a long discussion the patient, her aunt and daughter-in-law regarding her cryptogenic stroke and residual mild left hand weakness and answered questions.  I recommend she stay on aspirin for stroke prevention  and maintain aggressive risk factor modification strict control of hypertension with blood pressure goal below 130/90, lipids with LDL cholesterol goal below 70 mg percent and diabetes with hemoglobin A1c goal below 6.5%.  I also recommend she discontinue phentermine as well as birth control pills.  She was encouraged to eat a healthy diet with lots of fruits, vegetables, cereals, whole grains and exercise regularly and lose weight.  She also appears to be at risk for sleep apnea and will order polysomnogram to check for the same.  She may also consider possible participation in the [place] trial for stroke prevention.  Repeat protein S activity and antigen as well as anticardiolipin antibodies as the previous ones were borderline abnormal.  She will return for follow-up in 3 months or call earlier if necessary.  Greater than 50% time during this 45-minute  consultation visit was spent on counseling and coordination of care about cryptogenic stroke and discussion about evaluation and treatment answering questions. Antony Contras, MD  Note: This document was prepared with digital dictation and possible smart phrase technology. Any transcriptional errors that result from this process are unintentional.

## 2021-07-27 NOTE — Progress Notes (Signed)
Established Patient Office Visit  Subjective:  Patient ID: Angela Hahn, female    DOB: 07/31/1976  Age: 45 y.o. MRN: [id number]  CC:  Chief Complaint  Patient presents with   Acute Renal Failure    Pain and swelling in right hand, x1 wk pt states she has had stroke, on and of pain all day pt has been doing rice method thorughout the day      HPI Angela Hahn presents for approximately 4-day history of right hand pain.  Her pain is somewhat poorly localized.  She describes this as more of a burning sensation especially dorsum of the hand.  Seems to involve all digits.  No weakness.  Occasional tingling sensation involving all the digits.  Denies any recent neck pain.  No recent skin rashes.  Recent history significant for acute CVA back in October.  She had acute infarct of the right corona radiata/basal ganglia and right insula as well as 2 mm acute cortical infarct posterior right frontal lobe.  Patient had been on OCPs at that time and was obviously taken off.  Treated with Plavix and aspirin for 3 weeks and now aspirin.  Also now on high-dose Lipitor.  He had extensive work-up with her stroke.  14-day cardiac monitor still in place.  She is having tremendous anxiety following her stroke.  She has some fears of having recurrent issues.  Fortunately, her strength has returned left upper extremity.  She still has periods of malaise.  Speech has improved as well.  Past Medical History:  Diagnosis Date   Anemia    Chronic headaches    Dysmenorrhea    Heart murmur    dx'd since childhood    Past Surgical History:  Procedure Laterality Date   BUBBLE STUDY  07/14/2021   Procedure: BUBBLE STUDY;  Surgeon: Skeet Latch, MD;  Location: [facility];  Service: Cardiovascular;;   TEE WITHOUT CARDIOVERSION N/A 07/14/2021   Procedure: TRANSESOPHAGEAL ECHOCARDIOGRAM (TEE);  Surgeon: Skeet Latch, MD;  Location: [facility] ENDOSCOPY;  Service: Cardiovascular;  Laterality: N/A;   TUBAL  LIGATION      Family History  Problem Relation Age of Onset   Arthritis Mother    Depression Mother    Hypertension Mother    Diabetes Mother    Hypertension Father    Diabetes Father     Social History   Socioeconomic History   Marital status: Single    Spouse name: Not on file   Number of children: Not on file   Years of education: Not on file   Highest education level: Not on file  Occupational History   Not on file  Tobacco Use   Smoking status: Former    Types: Cigarettes    Quit date: 05/28/2014    Years since quitting: 7.1   Smokeless tobacco: Never  Vaping Use   Vaping Use: Never used  Substance and Sexual Activity   Alcohol use: No    Alcohol/week: 0.0 standard drinks   Drug use: No   Sexual activity: Yes    Partners: Male    Birth control/protection: Surgical    Comment: Tubal  Other Topics Concern   Not on file  Social History Narrative   Not on file   Social Determinants of Health   Financial Resource Strain: Not on file  Food Insecurity: Not on file  Transportation Needs: Not on file  Physical Activity: Not on file  Stress: Not on file  Social Connections: Not on file  Intimate Partner Violence: Not on file    Outpatient Medications Prior to Visit  Medication Sig Dispense Refill   acetaminophen (TYLENOL) 500 MG tablet Take 1,000 mg by mouth every 6 (six) hours as needed for mild pain or headache.     atorvastatin (LIPITOR) 40 MG tablet Take 1 tablet (40 mg total) by mouth daily. 30 tablet 0   LORazepam (ATIVAN) 0.5 MG tablet Take 1 tablet (0.5 mg total) by mouth every 8 (eight) hours as needed for anxiety. 90 tablet 1   tizanidine (ZANAFLEX) 2 MG capsule TAKE ONE CAPSULE BY MOUTH THREE TIMES A DAY 90 capsule 0   triamcinolone cream (KENALOG) 0.1 % Apply 1 application topically 2 (two) times daily. (Patient taking differently: Apply 1 application topically 2 (two) times daily as needed (eczema flare up).) 15 g 2   No facility-administered  medications prior to visit.    Allergies  Allergen Reactions   Gadolinium Derivatives Itching, Swelling and Cough   Hydrocodone Other (See Comments)    Itching, takes Benadryl with this medicine   Tramadol Other (See Comments)    headache    ROS Review of Systems  Constitutional:  Negative for fatigue.  Eyes:  Negative for visual disturbance.  Respiratory:  Negative for cough, chest tightness, shortness of breath and wheezing.   Cardiovascular:  Negative for chest pain, palpitations and leg swelling.  Neurological:  Negative for dizziness, seizures, syncope, weakness, light-headedness and headaches.  Psychiatric/Behavioral:  The patient is nervous/anxious.      Objective:    Physical Exam Constitutional:      Appearance: She is well-developed.  Eyes:     Pupils: Pupils are equal, round, and reactive to light.  Neck:     Thyroid: No thyromegaly.     Vascular: No JVD.  Cardiovascular:     Rate and Rhythm: Normal rate and regular rhythm.     Heart sounds:    No gallop.  Pulmonary:     Effort: Pulmonary effort is normal. No respiratory distress.     Breath sounds: Normal breath sounds. No wheezing or rales.  Musculoskeletal:     Cervical back: Neck supple.     Comments: No visible swelling right hand or wrist.  No localized bony tenderness.  Skin:    Findings: No rash.  Neurological:     General: No focal deficit present.     Mental Status: She is alert and oriented to person, place, and time.     Cranial Nerves: No cranial nerve deficit.     Sensory: No sensory deficit.     Motor: No weakness.     Coordination: Coordination normal.     Gait: Gait normal.     Deep Tendon Reflexes: Reflexes normal.     Comments: No upper extremity weakness.  Symmetric reflexes.    BP 136/84 (BP Location: Right Arm, Patient Position: Sitting, Cuff Size: Large)   Pulse 85   Temp 98.3 F (36.8 C) (Oral)   Resp 18   Wt 237 lb (107.5 kg)   LMP 07/05/2021 Comment: tubes tied  SpO2  97%   BMI 38.25 kg/m  Wt Readings from Last 3 Encounters:  07/27/21 237 lb (107.5 kg)  07/14/21 195 lb 15.8 oz (88.9 kg)  07/07/21 196 lb (88.9 kg)     Health Maintenance Due  Topic Date Due   COVID-19 Vaccine (1) Never done   Hepatitis C Screening  Never done   TETANUS/TDAP  Never done   PAP SMEAR-Modifier  Never  done   COLONOSCOPY (Pts 45-57yr Insurance coverage will need to be confirmed)  Never done   INFLUENZA VACCINE  Never done    There are no preventive care reminders to display for this patient.  Lab Results  Component Value Date   TSH 1.964 06/24/2021   Lab Results  Component Value Date   WBC 4.3 07/08/2021   HGB 10.8 (L) 07/08/2021   HCT 33.1 (L) 07/08/2021   MCV 74 (L) 07/08/2021   PLT 287 07/08/2021   Lab Results  Component Value Date   NA 141 07/08/2021   K 4.5 07/08/2021   CO2 23 07/08/2021   GLUCOSE 102 (H) 07/08/2021   BUN 10 07/08/2021   CREATININE 0.74 07/08/2021   BILITOT 0.4 06/23/2021   ALKPHOS 39 06/23/2021   AST 19 06/23/2021   ALT 23 06/23/2021   PROT 7.7 06/23/2021   ALBUMIN 3.8 06/23/2021   CALCIUM 9.6 07/08/2021   ANIONGAP 7 06/25/2021   EGFR 100 07/08/2021   GFR 109.80 11/19/2014   Lab Results  Component Value Date   CHOL 179 06/24/2021   Lab Results  Component Value Date   HDL 61 06/24/2021   Lab Results  Component Value Date   LDLCALC 95 06/24/2021   Lab Results  Component Value Date   TRIG 115 06/24/2021   Lab Results  Component Value Date   CHOLHDL 2.9 06/24/2021   Lab Results  Component Value Date   HGBA1C 6.3 (H) 06/24/2021      Assessment & Plan:   #1 paresthesia and pain right hand.  Nonfocal exam.  No skin rashes.  She describes a burning-like discomfort.  No rash to suggest shingles.  Burning quality suggest possible nerve related pain.  No weakness.  -Recommend observation for now.  Watch closely for any rashes.  If symptoms not resolving next week or to be in touch with primary or her  neurologist  #2 dyslipidemia.  Patient placed on high-dose statin in setting of recent acute CVA.  Recheck fasting lipid and hepatic panel today and continue close follow-up with primary   No orders of the defined types were placed in this encounter.   Follow-up: No follow-ups on file.    BCarolann Littler MD

## 2021-07-27 NOTE — Patient Instructions (Signed)
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  I recommend she stay on aspirin for stroke prevention and maintain aggressive risk factor modification strict control of hypertension with blood pressure goal below 130/90, lipids with LDL cholesterol goal below 70 mg percent and diabetes with hemoglobin A1c goal below 6.5%.  I also recommend she discontinue phentermine as well as birth control pills.  She was encouraged to eat a healthy diet with lots of fruits, vegetables, cereals, whole grains and exercise regularly and lose weight.  She also appears to be at risk for sleep apnea and will order polysomnogram to check for the same.  She may also consider possible participation in the [place] trial for stroke prevention.  Repeat protein S activity and antigen as well as anticardiolipin antibodies as the previous ones were borderline abnormal.  She will return for follow-up in 3 months or call earlier if necessary. Stroke Prevention Some medical conditions and behaviors can lead to a higher chance of having a stroke. You can help prevent a stroke by eating healthy, exercising, not smoking, and managing any medical conditions you have. Stroke is a leading cause of functional impairment. Primary prevention is particularly important because a majority of strokes are first-time events. Stroke changes the lives of not only those who experience a stroke but also their family and other caregivers. How can this condition affect me? A stroke is a medical emergency and should be treated right away. A stroke can lead to brain damage and can sometimes be life-threatening. If a person gets medical treatment right away, there is a better chance of surviving and recovering from a stroke. What can increase my risk? The following medical conditions may increase your risk of a stroke: Cardiovascular disease. High  blood pressure (hypertension). Diabetes. High cholesterol. Sickle cell disease. Blood clotting disorders (hypercoagulable state). Obesity. Sleep disorders (obstructive sleep apnea). Other risk factors include: Being older than age 40. Having a history of blood clots, stroke, or mini-stroke (transient ischemic attack, TIA). Genetic factors, such as race, ethnicity, or a family history of stroke. Smoking cigarettes or using other tobacco products. Taking birth control pills, especially if you also use tobacco. Heavy use of alcohol or drugs, especially cocaine and methamphetamine. Physical inactivity. What actions can I take to prevent this? Manage your health conditions High cholesterol levels. Eating a healthy diet is important for preventing high cholesterol. If cholesterol cannot be managed through diet alone, you may need to take medicines. Take any prescribed medicines to control your cholesterol as told by your health care provider. Hypertension. To reduce your risk of stroke, try to keep your blood pressure below 130/80. Eating a healthy diet and exercising regularly are important for controlling blood pressure. If these steps are not enough to manage your blood pressure, you may need to take medicines. Take any prescribed medicines to control hypertension as told by your health care provider. Ask your health care provider if you should monitor your blood pressure at home. Have your blood pressure checked every year, even if your blood pressure is normal. Blood pressure increases with age and some medical conditions. Diabetes. Eating a healthy diet and exercising regularly are important parts of managing your blood sugar (glucose). If your blood sugar cannot be managed through diet and exercise, you may need to take medicines. Take any prescribed medicines to control your diabetes as told by your health care provider. Get evaluated  for obstructive sleep apnea. Talk to your health  care provider about getting a sleep evaluation if you snore a lot or have excessive sleepiness. Make sure that any other medical conditions you have, such as atrial fibrillation or atherosclerosis, are managed. Nutrition Follow instructions from your health care provider about what to eat or drink to help manage your health condition. These instructions may include: Reducing your daily calorie intake. Limiting how much salt (sodium) you use to 1,500 milligrams (mg) each day. Using only healthy fats for cooking, such as olive oil, canola oil, or sunflower oil. Eating healthy foods. You can do this by: Choosing foods that are high in fiber, such as whole grains, and fresh fruits and vegetables. Eating at least 5 servings of fruits and vegetables a day. Try to fill one-half of your plate with fruits and vegetables at each meal. Choosing lean protein foods, such as lean cuts of meat, poultry without skin, fish, tofu, beans, and nuts. Eating low-fat dairy products. Avoiding foods that are high in sodium. This can help lower blood pressure. Avoiding foods that have saturated fat, trans fat, and cholesterol. This can help prevent high cholesterol. Avoiding processed and prepared foods. Counting your daily carbohydrate intake.  Lifestyle If you drink alcohol: Limit how much you have to: 0-1 drink a day for women who are not pregnant. 0-2 drinks a day for men. Know how much alcohol is in your drink. In the [place] one drink equals one 12 oz bottle of beer (341mL), one 5 oz glass of wine (167mL), or one 1 oz glass of hard liquor (62mL). Do not use any products that contain nicotine or tobacco. These products include cigarettes, chewing tobacco, and vaping devices, such as e-cigarettes. If you need help quitting, ask your health care provider. Avoid secondhand smoke. Do not use drugs. Activity  Try to stay at a healthy weight. Get at least 30 minutes of exercise on most days, such as: Fast  walking. Biking. Swimming. Medicines Take over-the-counter and prescription medicines only as told by your health care provider. Aspirin or blood thinners (antiplatelets or anticoagulants) may be recommended to reduce your risk of forming blood clots that can lead to stroke. Avoid taking birth control pills. Talk to your health care provider about the risks of taking birth control pills if: You are over 52 years old. You smoke. You get very bad headaches. You have had a blood clot. Where to find more information American Stroke Association: www.strokeassociation.org Get help right away if: You or a loved one has any symptoms of a stroke. "BE FAST" is an easy way to remember the main warning signs of a stroke: B - Balance. Signs are dizziness, sudden trouble walking, or loss of balance. E - Eyes. Signs are trouble seeing or a sudden change in vision. F - Face. Signs are sudden weakness or numbness of the face, or the face or eyelid drooping on one side. A - Arms. Signs are weakness or numbness in an arm. This happens suddenly and usually on one side of the body. S - Speech. Signs are sudden trouble speaking, slurred speech, or trouble understanding what people say. T - Time. Time to call emergency services. Write down what time symptoms started. You or a loved one has other signs of a stroke, such as: A sudden, severe headache with no known cause. Nausea or vomiting. Seizure. These symptoms may represent a serious problem that is an emergency. Do not wait to see if the symptoms will go  away. Get medical help right away. Call your local emergency services (911 in the [place] Do not drive yourself to the hospital. Summary You can help to prevent a stroke by eating healthy, exercising, not smoking, limiting alcohol intake, and managing any medical conditions you may have. Do not use any products that contain nicotine or tobacco. These include cigarettes, chewing tobacco, and vaping devices,  such as e-cigarettes. If you need help quitting, ask your health care provider. Remember "BE FAST" for warning signs of a stroke. Get help right away if you or a loved one has any of these signs. This information is not intended to replace advice given to you by your health care provider. Make sure you discuss any questions you have with your health care provider. Document Revised: 03/15/2020 Document Reviewed: 03/15/2020 Elsevier Patient Education  [place].

## 2021-07-27 NOTE — Patient Instructions (Signed)
Watch closely for any skin rashes- especially blistery rash.

## 2021-07-28 ENCOUNTER — Ambulatory Visit: Payer: Self-pay | Attending: Family Medicine | Admitting: Occupational Therapy

## 2021-07-28 NOTE — Telephone Encounter (Signed)
Pt medical clearance form was completed and faxed to [facility], confirmed with Ms Wynetta Emery that fax was received, copy sent to scnaning

## 2021-07-30 LAB — PROTEIN S ACTIVITY: Protein S Activity: 73 % (ref 63–140)

## 2021-07-30 LAB — CARDIOLIPIN ANTIBODIES, IGG, IGM, IGA
Anticardiolipin IgA: <9 APL U/mL (ref 0–11)
Anticardiolipin IgG: <9 GPL U/mL (ref 0–14)
Anticardiolipin IgM: 35 MPL U/mL — ABNORMAL HIGH (ref 0–12)

## 2021-07-30 LAB — PROTEIN S, ANTIGEN, FREE: Protein S Ag, Free: 80 % (ref 61–136)

## 2021-08-01 ENCOUNTER — Telehealth: Payer: Self-pay

## 2021-08-01 ENCOUNTER — Ambulatory Visit: Payer: Self-pay | Admitting: Occupational Therapy

## 2021-08-01 NOTE — Telephone Encounter (Signed)
I called the patient and advised of results. She verbalized understanding and expressed appreciation for the call. All questions answered.

## 2021-08-01 NOTE — Telephone Encounter (Signed)
-----   Message from Garvin Fila, MD sent at 08/01/2021  3:35 PM EST ----- Kindly inform the patient that the lab work done for abnormal clotting shows improvement and nothing to worry about. 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 I would like Dr. Leonie Man to review on his return to the office thanks  ----- Message ----- From: Desmond Lope, RN Sent: 08/01/2021   7:07 AM EST To: Melvenia Beam, MD  Labs sent to work-in MD for review. ----- Message ----- From: Lavone Neri Lab Results In Sent: 07/29/2021   4:36 PM EST To: Garvin Fila, MD

## 2021-08-04 ENCOUNTER — Ambulatory Visit: Payer: Self-pay | Admitting: Occupational Therapy

## 2021-08-08 ENCOUNTER — Encounter: Payer: Medicaid Other | Admitting: Occupational Therapy

## 2021-08-08 ENCOUNTER — Encounter: Payer: Self-pay | Admitting: Occupational Therapy

## 2021-08-11 ENCOUNTER — Encounter: Payer: Medicaid Other | Admitting: Occupational Therapy

## 2021-08-15 ENCOUNTER — Encounter: Payer: Medicaid Other | Admitting: Occupational Therapy

## 2021-08-17 ENCOUNTER — Other Ambulatory Visit: Payer: Self-pay

## 2021-08-17 MED ORDER — METOPROLOL SUCCINATE ER 25 MG PO TB24: 12.5000 mg | ORAL_TABLET | Freq: Every day | ORAL | 3 refills | Status: DC

## 2021-08-17 NOTE — Progress Notes (Signed)
Prescription sent to pharmacy.

## 2021-08-18 ENCOUNTER — Encounter: Payer: Medicaid Other | Admitting: Occupational Therapy

## 2021-08-21 ENCOUNTER — Other Ambulatory Visit: Payer: Self-pay | Admitting: Family Medicine

## 2021-08-23 ENCOUNTER — Encounter: Payer: Medicaid Other | Admitting: Occupational Therapy

## 2021-08-25 ENCOUNTER — Telehealth: Payer: Self-pay | Admitting: Family Medicine

## 2021-08-25 ENCOUNTER — Encounter: Payer: Medicaid Other | Admitting: Occupational Therapy

## 2021-08-25 NOTE — Telephone Encounter (Addendum)
Pt was sent to access nurse due to headaches. Pt has a history of stoke. I called [facility] at [place] no avail morning appt spoke with Tammy I tried dr Jenny Reichmann at [facility] no openings. Pt wanted to wait until tomorrow to see if dr fry can work her in. Pt was advise she may want to go to urgent care or ER

## 2021-08-26 ENCOUNTER — Other Ambulatory Visit: Payer: Self-pay

## 2021-08-26 ENCOUNTER — Emergency Department (HOSPITAL_BASED_OUTPATIENT_CLINIC_OR_DEPARTMENT_OTHER): Payer: Medicaid Other

## 2021-08-26 ENCOUNTER — Encounter (HOSPITAL_BASED_OUTPATIENT_CLINIC_OR_DEPARTMENT_OTHER): Payer: Self-pay | Admitting: Emergency Medicine

## 2021-08-26 DIAGNOSIS — R519 Headache, unspecified: Secondary | ICD-10-CM | POA: Insufficient documentation

## 2021-08-26 DIAGNOSIS — Z8673 Personal history of transient ischemic attack (TIA), and cerebral infarction without residual deficits: Secondary | ICD-10-CM | POA: Insufficient documentation

## 2021-08-26 DIAGNOSIS — D649 Anemia, unspecified: Secondary | ICD-10-CM | POA: Insufficient documentation

## 2021-08-26 DIAGNOSIS — Z79899 Other long term (current) drug therapy: Secondary | ICD-10-CM | POA: Insufficient documentation

## 2021-08-26 DIAGNOSIS — R42 Dizziness and giddiness: Secondary | ICD-10-CM | POA: Insufficient documentation

## 2021-08-26 DIAGNOSIS — Z8616 Personal history of COVID-19: Secondary | ICD-10-CM | POA: Insufficient documentation

## 2021-08-26 DIAGNOSIS — Z87891 Personal history of nicotine dependence: Secondary | ICD-10-CM | POA: Insufficient documentation

## 2021-08-26 LAB — CBC
HCT: 31.7 % — ABNORMAL LOW (ref 36.0–46.0)
Hemoglobin: 9.5 g/dL — ABNORMAL LOW (ref 12.0–15.0)
MCH: 22.3 pg — ABNORMAL LOW (ref 26.0–34.0)
MCHC: 30 g/dL (ref 30.0–36.0)
MCV: 74.4 fL — ABNORMAL LOW (ref 80.0–100.0)
Platelets: 346 10*3/uL (ref 150–400)
RBC: 4.26 MIL/uL (ref 3.87–5.11)
RDW: 14 % (ref 11.5–15.5)
WBC: 4.4 10*3/uL (ref 4.0–10.5)
nRBC: 0 % (ref 0.0–0.2)

## 2021-08-26 LAB — COMPREHENSIVE METABOLIC PANEL
ALT: 18 U/L (ref 0–44)
AST: 15 U/L (ref 15–41)
Albumin: 4 g/dL (ref 3.5–5.0)
Alkaline Phosphatase: 48 U/L (ref 38–126)
Anion gap: 7 (ref 5–15)
BUN: 11 mg/dL (ref 6–20)
CO2: 25 mmol/L (ref 22–32)
Calcium: 9.4 mg/dL (ref 8.9–10.3)
Chloride: 106 mmol/L (ref 98–111)
Creatinine, Ser: 0.63 mg/dL (ref 0.44–1.00)
GFR, Estimated: >60 mL/min (ref >60)
Glucose, Bld: 131 mg/dL — ABNORMAL HIGH (ref 70–99)
Potassium: 3.6 mmol/L (ref 3.5–5.1)
Sodium: 138 mmol/L (ref 135–145)
Total Bilirubin: 0.4 mg/dL (ref 0.3–1.2)
Total Protein: 7.5 g/dL (ref 6.5–8.1)

## 2021-08-26 LAB — DIFFERENTIAL
Abs Immature Granulocytes: 0.01 10*3/uL (ref 0.00–0.07)
Basophils Absolute: 0 10*3/uL (ref 0.0–0.1)
Basophils Relative: 1 %
Eosinophils Absolute: 0.1 10*3/uL (ref 0.0–0.5)
Eosinophils Relative: 2 %
Immature Granulocytes: 0 %
Lymphocytes Relative: 30 %
Lymphs Abs: 1.3 10*3/uL (ref 0.7–4.0)
Monocytes Absolute: 0.4 10*3/uL (ref 0.1–1.0)
Monocytes Relative: 9 %
Neutro Abs: 2.6 10*3/uL (ref 1.7–7.7)
Neutrophils Relative %: 58 %

## 2021-08-26 LAB — PROTIME-INR
INR: 1 (ref 0.8–1.2)
Prothrombin Time: 12.9 seconds (ref 11.4–15.2)

## 2021-08-26 LAB — APTT: aPTT: 26 seconds (ref 24–36)

## 2021-08-26 MED ORDER — ACETAMINOPHEN 325 MG PO TABS
650.0000 mg | ORAL_TABLET | Freq: Once | ORAL | Status: AC
  Administered 2021-08-26: 650 mg via ORAL
  Filled 2021-08-26: qty 2

## 2021-08-26 NOTE — ED Triage Notes (Signed)
Pt arrives pov with c/o HA with "some" blurred vision x 1 week, hx of stroke. Pt also endorses nausea x 2 days. Denies dizziness. Pt AOx4, Bilaterally equal, VAN negative.

## 2021-08-26 NOTE — Telephone Encounter (Signed)
FYI Called pt to f/u if she went to the ER, pt stated that she went to [facility] but the ED was too crowded and she had to leave to pick up her son, advised pt that she need to be seen due to her history of stroke, advised to go to [facility] on [address] which is less crowded. Pt agreed and verbalized understanding

## 2021-08-26 NOTE — Telephone Encounter (Addendum)
I spoke with dr fry and he advise me to tell the pt to go to ER. Pt was informed of md advice. Pt said ok

## 2021-08-27 ENCOUNTER — Emergency Department (HOSPITAL_BASED_OUTPATIENT_CLINIC_OR_DEPARTMENT_OTHER)
Admission: EM | Admit: 2021-08-27 | Discharge: 2021-08-27 | Disposition: A | Discharge status: Home / Self Care | Payer: Medicaid Other | Attending: Emergency Medicine | Admitting: Emergency Medicine

## 2021-08-27 DIAGNOSIS — D649 Anemia, unspecified: Secondary | ICD-10-CM

## 2021-08-27 DIAGNOSIS — R519 Headache, unspecified: Secondary | ICD-10-CM

## 2021-08-27 HISTORY — DX: Cerebral infarction, unspecified: I63.9

## 2021-08-27 NOTE — ED Provider Notes (Signed)
[facility] DEPT Provider Note   CSN: [id number] Arrival date & time: 08/26/21  1551     History Chief Complaint  Patient presents with   Headache    Angela Hahn is a 45 y.o. female.  45 yo F w/ recent stroke presents to the ED with headaches that are associated with feelings like an out of body experience, fatigue, light headed. Had a prolonged period this month since she came off her birth control. Headaches daily, improve with tylenol. Sometimes will have headaches with her cycle but this seems different somewhow. Sometimes frontal. No trauma or falls. Persistent paresthesias in arms. Has h/o anxiety. Has had episodes of chest tightness, palpitations recently. Seems like ativan is helping but has increased dosing.    Headache     Past Medical History:  Diagnosis Date   Anemia    Chronic headaches    Dysmenorrhea    Heart murmur    dx'd since childhood   Stroke [facility])     Patient Active Problem List   Diagnosis Date Noted   Anxiety disorder 07/07/2021   Mixed hyperlipidemia 07/04/2021   CVA (cerebral vascular accident) ([place]) 06/23/2021   COVID-19 virus infection 03/08/2021   Menorrhagia with irregular cycle 10/27/2020   Flu-like symptoms 09/30/2018   Pain of right thumb 06/04/2018   Concussion with loss of consciousness 09/24/2015   Chronic low back pain 06/18/2012    Past Surgical History:  Procedure Laterality Date   BUBBLE STUDY  07/14/2021   Procedure: BUBBLE STUDY;  Surgeon: Skeet Latch, MD;  Location: [place];  Service: Cardiovascular;;   TEE WITHOUT CARDIOVERSION N/A 07/14/2021   Procedure: TRANSESOPHAGEAL ECHOCARDIOGRAM (TEE);  Surgeon: Skeet Latch, MD;  Location: [facility] ENDOSCOPY;  Service: Cardiovascular;  Laterality: N/A;   TUBAL LIGATION       OB History     Gravida  3   Para  3   Term  3   Preterm      AB      Living  3      SAB      IAB      Ectopic      Multiple      Live Births               Family History  Problem Relation Age of Onset   Arthritis Mother    Depression Mother    Hypertension Mother    Diabetes Mother    Hypertension Father    Diabetes Father     Social History   Tobacco Use   Smoking status: Former    Types: Cigarettes    Quit date: 05/28/2014    Years since quitting: 7.2   Smokeless tobacco: Never  Vaping Use   Vaping Use: Never used  Substance Use Topics   Alcohol use: No    Alcohol/week: 0.0 standard drinks   Drug use: No    Home Medications Prior to Admission medications   Medication Sig Start Date End Date Taking? Authorizing Provider  metoprolol succinate (TOPROL XL) 25 MG 24 hr tablet Take 0.5 tablets (12.5 mg total) by mouth daily. 08/17/21   Tobb, Kardie, DO  acetaminophen (TYLENOL) 500 MG tablet Take 1,000 mg by mouth every 6 (six) hours as needed for mild pain or headache.    [provider]  atorvastatin (LIPITOR) 40 MG tablet TAKE ONE TABLET BY MOUTH DAILY 08/23/21   Laurey Morale, MD  LORazepam (ATIVAN) 0.5 MG tablet Take 1 tablet (0.5  mg total) by mouth every 8 (eight) hours as needed for anxiety. 07/07/21   Laurey Morale, MD  triamcinolone cream (KENALOG) 0.1 % Apply 1 application topically 2 (two) times daily. Patient taking differently: Apply 1 application topically 2 (two) times daily as needed (eczema flare up). 12/02/20   Laurey Morale, MD  Cetirizine HCl 10 MG CAPS Take 1 capsule (10 mg total) by mouth daily. 08/28/19 12/17/20  Wieters, Hallie C, PA-C    Allergies    Gadolinium derivatives, Hydrocodone, and Tramadol  Review of Systems   Review of Systems  Neurological:  Positive for light-headedness and headaches.  All other systems reviewed and are negative.  Physical Exam Updated Vital Signs BP (!) 147/88 (BP Location: Right Arm)    Pulse 84    Temp 98.6 F (37 C) (Oral)    Resp 18    Ht 5\' 6"  (1.676 m)    Wt 106.1 kg    LMP 08/19/2021    SpO2 99%    BMI 37.77 kg/m   Physical Exam Vitals  and nursing note reviewed.  Constitutional:      Appearance: She is well-developed.  HENT:     Head: Normocephalic and atraumatic.  Eyes:     General: No visual field deficit. Cardiovascular:     Rate and Rhythm: Normal rate and regular rhythm.  Pulmonary:     Effort: No respiratory distress.     Breath sounds: No stridor. No wheezing or rhonchi.  Abdominal:     General: There is no distension.     Palpations: Abdomen is soft.  Musculoskeletal:        General: No swelling or tenderness. Normal range of motion.     Cervical back: Normal range of motion.  Neurological:     Mental Status: She is alert and oriented to person, place, and time.     GCS: GCS eye subscore is 4. GCS verbal subscore is 5. GCS motor subscore is 6.     Cranial Nerves: No cranial nerve deficit, dysarthria or facial asymmetry.     Sensory: No sensory deficit.     Motor: No weakness.     Coordination: Romberg sign negative. Coordination normal.     Gait: Gait normal.    ED Results / Procedures / Treatments   Labs (all labs ordered are listed, but only abnormal results are displayed) Labs Reviewed  CBC - Abnormal; Notable for the following components:      Result Value   Hemoglobin 9.5 (*)    HCT 31.7 (*)    MCV 74.4 (*)    MCH 22.3 (*)    All other components within normal limits  COMPREHENSIVE METABOLIC PANEL - Abnormal; Notable for the following components:   Glucose, Bld 131 (*)    All other components within normal limits  PROTIME-INR  APTT  DIFFERENTIAL  CBG MONITORING, ED    EKG None  Radiology CT HEAD WO CONTRAST  Result Date: 08/26/2021 CLINICAL DATA:  Headache, blurred vision. EXAM: CT HEAD WITHOUT CONTRAST TECHNIQUE: Contiguous axial images were obtained from the base of the skull through the vertex without intravenous contrast. COMPARISON:  June 24, 2021. FINDINGS: Brain: No evidence of acute infarction, hemorrhage, hydrocephalus, extra-axial collection or mass lesion/mass  effect. Vascular: No hyperdense vessel or unexpected calcification. Skull: Normal. Negative for fracture or focal lesion. Sinuses/Orbits: No acute finding. Other: None. IMPRESSION: No acute intracranial abnormality seen. Electronically Signed   By: Marijo Conception M.D.   On: 08/26/2021  16:36    Procedures Procedures   Medications Ordered in ED Medications  acetaminophen (TYLENOL) tablet 650 mg (650 mg Oral Given 08/26/21 2019)    ED Course  I have reviewed the triage vital signs and the nursing notes.  Pertinent labs & imaging results that were available during my care of the patient were reviewed by me and considered in my medical decision making (see chart for details).  Clinical Course as of 08/27/21 0132  Sat Aug 27, 2021  0040 Glucose(!): 131 [JM]  0040 Hemoglobin(!): 9.5 Will need to check on this versus her baseline. Only slightly below baseline. [JM]  0040 WBC: 4.4 [JM]  0040 CT HEAD WO CONTRAST I don't see any obvious acute abnormalities on ct [JM]  0103 No e/o acute stroke. Headache improved at this time. Doubt bleed. Could be related to anxiety but has had headaches and abnormal symptoms related to anemia before. Suspect the anemia is from her recent heavy period. No persistent blurry vision to suspect IIH. VS reassuring aside from mild elevated BP, but not htn crisis. Will have her to continue home OTC meds.  [JM]    Clinical Course User Index [JM] Guilianna Mckoy, Corene Cornea, MD   MDM Rules/Calculators/A&P                           Final Clinical Impression(s) / ED Diagnoses Final diagnoses:  Nonintractable headache, unspecified chronicity pattern, unspecified headache type  Anemia, unspecified type    Rx / DC Orders ED Discharge Orders     None        Alphonzo Devera, Corene Cornea, MD 08/27/21 [phone]

## 2021-08-30 ENCOUNTER — Other Ambulatory Visit: Payer: Self-pay

## 2021-08-30 ENCOUNTER — Encounter: Payer: Medicaid Other | Admitting: Occupational Therapy

## 2021-08-30 ENCOUNTER — Telehealth: Payer: Self-pay | Admitting: Cardiology

## 2021-08-30 MED ORDER — DILTIAZEM HCL ER COATED BEADS 120 MG PO CP24: 120.0000 mg | ORAL_CAPSULE | Freq: Every day | ORAL | 0 refills | Status: DC

## 2021-08-30 NOTE — Progress Notes (Signed)
30 day supply of Cardizem 120 mg sent in for pt to try. She will reach out to let us know how she is doing on the new medication in 2-3 weeks. Pt verbalized understanding and is agreeable with the plan.

## 2021-08-30 NOTE — Telephone Encounter (Signed)
30 day supply of Cardizem 120 mg sent in for pt to try. She will reach out to let us know how she is doing on the new medication in 2-3 weeks. Pt verbalized understanding and is agreeable with the plan.

## 2021-08-30 NOTE — Telephone Encounter (Signed)
I would like her to start Cardizem 120mg  daily

## 2021-08-30 NOTE — Telephone Encounter (Signed)
Called patient Angela Hahn states Angela Hahn stopped taking the medications for 2 days and has not had any symptoms since stopping. Patient denies headaches, nausea or dizziness since stopping medication. Pt states Angela Hahn is still having palpations, "it is not worst than before but they are still there." Will relay message to Dr. Harriet Masson for recommendations.

## 2021-08-30 NOTE — Telephone Encounter (Signed)
Pt c/o medication issue:  1. Name of Medication: metoprolol succinate (TOPROL XL) 25 MG 24 hr tablet  2. How are you currently taking this medication (dosage and times per day)? As written   3. Are you having a reaction (difficulty breathing--STAT)? Yes   4. What is your medication issue? Meidcation makes dizzy, headache, nausea, heart palpitations, lightheadedness, had to go to hospital

## 2021-09-01 ENCOUNTER — Encounter: Payer: Medicaid Other | Admitting: Occupational Therapy

## 2021-09-05 ENCOUNTER — Encounter: Payer: Self-pay | Admitting: Family Medicine

## 2021-09-05 ENCOUNTER — Ambulatory Visit (INDEPENDENT_AMBULATORY_CARE_PROVIDER_SITE_OTHER): Payer: Medicaid Other | Admitting: Family Medicine

## 2021-09-05 VITALS — BP 124/78 | HR 88 | Temp 99.2°F | Wt 236.0 lb

## 2021-09-05 DIAGNOSIS — I10 Essential (primary) hypertension: Secondary | ICD-10-CM

## 2021-09-05 DIAGNOSIS — Z8673 Personal history of transient ischemic attack (TIA), and cerebral infarction without residual deficits: Secondary | ICD-10-CM

## 2021-09-05 DIAGNOSIS — F419 Anxiety disorder, unspecified: Secondary | ICD-10-CM

## 2021-09-05 DIAGNOSIS — I63411 Cerebral infarction due to embolism of right middle cerebral artery: Secondary | ICD-10-CM

## 2021-09-05 MED ORDER — ESCITALOPRAM OXALATE 10 MG PO TABS: 10.0000 mg | ORAL_TABLET | Freq: Every day | ORAL | 5 refills | Status: DC

## 2021-09-05 NOTE — Progress Notes (Signed)
° °  Subjective:    Patient ID: Angela Hahn, female    DOB: 11-16-1975, 46 y.o.   MRN: [id number]  HPI Here to follow up on a recent stroke, HTN, and anxiety. A few weeks ago she was dealing with frequent headaches, dizziness, and fatigue. She determined these were side effects of the Metoprolol she was taking. After speaking to Cardiology, this was stopped and switched to Diltiazem. She says these symptoms resolved immediately. Now she feels much better. She went back to work full time last week, and this seemed to make her anxiety worse. She works in a [employer] and things can be stressful there. She has been taking Lorazepam TID to deal with this, but this makes her feels sluggish and she worries about taking it so often. She notes that her mother has taken Lexapro for years and does well with this.    Review of Systems  Constitutional: Negative.   Respiratory: Negative.    Cardiovascular: Negative.   Neurological: Negative.   Psychiatric/Behavioral:  Negative for agitation, confusion, decreased concentration, dysphoric mood and sleep disturbance. The patient is nervous/anxious.       Objective:   Physical Exam Constitutional:      Appearance: Normal appearance.  Cardiovascular:     Rate and Rhythm: Normal rate and regular rhythm.     Pulses: Normal pulses.     Heart sounds: Normal heart sounds.  Pulmonary:     Effort: Pulmonary effort is normal.     Breath sounds: Normal breath sounds.  Neurological:     General: No focal deficit present.     Mental Status: She is alert and oriented to person, place, and time.  Psychiatric:        Mood and Affect: Mood normal.        Behavior: Behavior normal.        Thought Content: Thought content normal.          Assessment & Plan:  She is doing well after her stroke. Her HTN is stable. Now that she has stopped Metoprolol and started on Diltiazem, the dizziness and fatigue and headaches are gone. We agreed that she would do well  on a daily anxiety medication, so she will try Lexapro 10 mg daily. She will use the Lorazepam only as needed. Recheck in 3-4 weeks.  Alysia Penna, MD

## 2021-09-08 ENCOUNTER — Telehealth: Payer: Self-pay | Admitting: Family Medicine

## 2021-09-08 NOTE — Telephone Encounter (Signed)
Pt call and stated she can't Lexapro because she have a bad reaction when she take it and want dr. Sarajane Jews to call her something difference because she stated it don't work for her.

## 2021-09-09 ENCOUNTER — Other Ambulatory Visit: Payer: Self-pay

## 2021-09-09 MED ORDER — HYDROXYZINE PAMOATE 25 MG PO CAPS: 25.0000 mg | ORAL_CAPSULE | Freq: Four times a day (QID) | ORAL | 2 refills | Status: DC | PRN

## 2021-09-09 NOTE — Telephone Encounter (Addendum)
Pt said the ativan makes her sleeping and she can take lexapro makes he feel like she is having a stroke land would like something else send to  [facility] [id number], [place] Phone:  [phone]  Fax:  [phone]

## 2021-09-09 NOTE — Telephone Encounter (Signed)
Please advise 

## 2021-09-09 NOTE — Telephone Encounter (Signed)
Spoke with pt advised of Dr fry advise, verbalized understanding, New Rx for Hydroxyzine was sent to pt pharmacy. Rx for Lorazepam / Lexapro have been d/c per Dr Sarajane Jews

## 2021-09-09 NOTE — Telephone Encounter (Signed)
Stop both of these and call in Hydroxyzine 25 mg tablets to take every 6 hours as needed for anxiety, #60 with 2 rf

## 2021-09-12 ENCOUNTER — Telehealth: Payer: Self-pay

## 2021-09-12 NOTE — Telephone Encounter (Signed)
Spoke with pt pharmacy advised per Dr Sarajane Jews that pt is nolonger taking Lexapro, therefore there should be no interaction as stated by pt pharmacy

## 2021-09-26 ENCOUNTER — Other Ambulatory Visit: Payer: Self-pay | Admitting: Cardiology

## 2021-09-27 ENCOUNTER — Ambulatory Visit: Payer: Medicaid Other | Admitting: Cardiology

## 2021-09-30 ENCOUNTER — Emergency Department (HOSPITAL_COMMUNITY)
Admission: EM | Admit: 2021-09-30 | Discharge: 2021-09-30 | Disposition: A | Discharge status: Home / Self Care | Payer: 59 | Attending: Emergency Medicine | Admitting: Emergency Medicine

## 2021-09-30 ENCOUNTER — Encounter (HOSPITAL_COMMUNITY): Payer: Self-pay | Admitting: *Deleted

## 2021-09-30 ENCOUNTER — Other Ambulatory Visit: Payer: Self-pay

## 2021-09-30 ENCOUNTER — Emergency Department (HOSPITAL_COMMUNITY): Payer: 59

## 2021-09-30 ENCOUNTER — Telehealth: Payer: Self-pay | Admitting: Family Medicine

## 2021-09-30 DIAGNOSIS — R42 Dizziness and giddiness: Secondary | ICD-10-CM | POA: Insufficient documentation

## 2021-09-30 DIAGNOSIS — R002 Palpitations: Secondary | ICD-10-CM | POA: Insufficient documentation

## 2021-09-30 DIAGNOSIS — R000 Tachycardia, unspecified: Secondary | ICD-10-CM | POA: Diagnosis not present

## 2021-09-30 DIAGNOSIS — Z79899 Other long term (current) drug therapy: Secondary | ICD-10-CM | POA: Insufficient documentation

## 2021-09-30 DIAGNOSIS — R079 Chest pain, unspecified: Secondary | ICD-10-CM

## 2021-09-30 LAB — CBC
HCT: 32.7 % — ABNORMAL LOW (ref 36.0–46.0)
Hemoglobin: 9.4 g/dL — ABNORMAL LOW (ref 12.0–15.0)
MCH: 21.3 pg — ABNORMAL LOW (ref 26.0–34.0)
MCHC: 28.7 g/dL — ABNORMAL LOW (ref 30.0–36.0)
MCV: 74.1 fL — ABNORMAL LOW (ref 80.0–100.0)
Platelets: 341 10*3/uL (ref 150–400)
RBC: 4.41 MIL/uL (ref 3.87–5.11)
RDW: 15.1 % (ref 11.5–15.5)
WBC: 6.6 10*3/uL (ref 4.0–10.5)
nRBC: 0 % (ref 0.0–0.2)

## 2021-09-30 LAB — BASIC METABOLIC PANEL
Anion gap: 8 (ref 5–15)
BUN: 7 mg/dL (ref 6–20)
CO2: 25 mmol/L (ref 22–32)
Calcium: 9.5 mg/dL (ref 8.9–10.3)
Chloride: 105 mmol/L (ref 98–111)
Creatinine, Ser: 0.68 mg/dL (ref 0.44–1.00)
GFR, Estimated: >60 mL/min (ref >60)
Glucose, Bld: 145 mg/dL — ABNORMAL HIGH (ref 70–99)
Potassium: 3.6 mmol/L (ref 3.5–5.1)
Sodium: 138 mmol/L (ref 135–145)

## 2021-09-30 LAB — TROPONIN I (HIGH SENSITIVITY): Troponin I (High Sensitivity): <2 ng/L (ref <18)

## 2021-09-30 LAB — MAGNESIUM: Magnesium: 2 mg/dL (ref 1.7–2.4)

## 2021-09-30 LAB — D-DIMER, QUANTITATIVE: D-Dimer, Quant: 0.34 ug/mL-FEU (ref 0.00–0.50)

## 2021-09-30 LAB — TSH: TSH: 2.079 u[IU]/mL (ref 0.350–4.500)

## 2021-09-30 IMAGING — DX DG CHEST 1V PORT
1 series · 1 of 1 positions shown · non-contrast
Comparison: [DATE]

CLINICAL DATA: Tachycardia, chest pain

EXAM:
PORTABLE CHEST 1 VIEW

[chest ap]
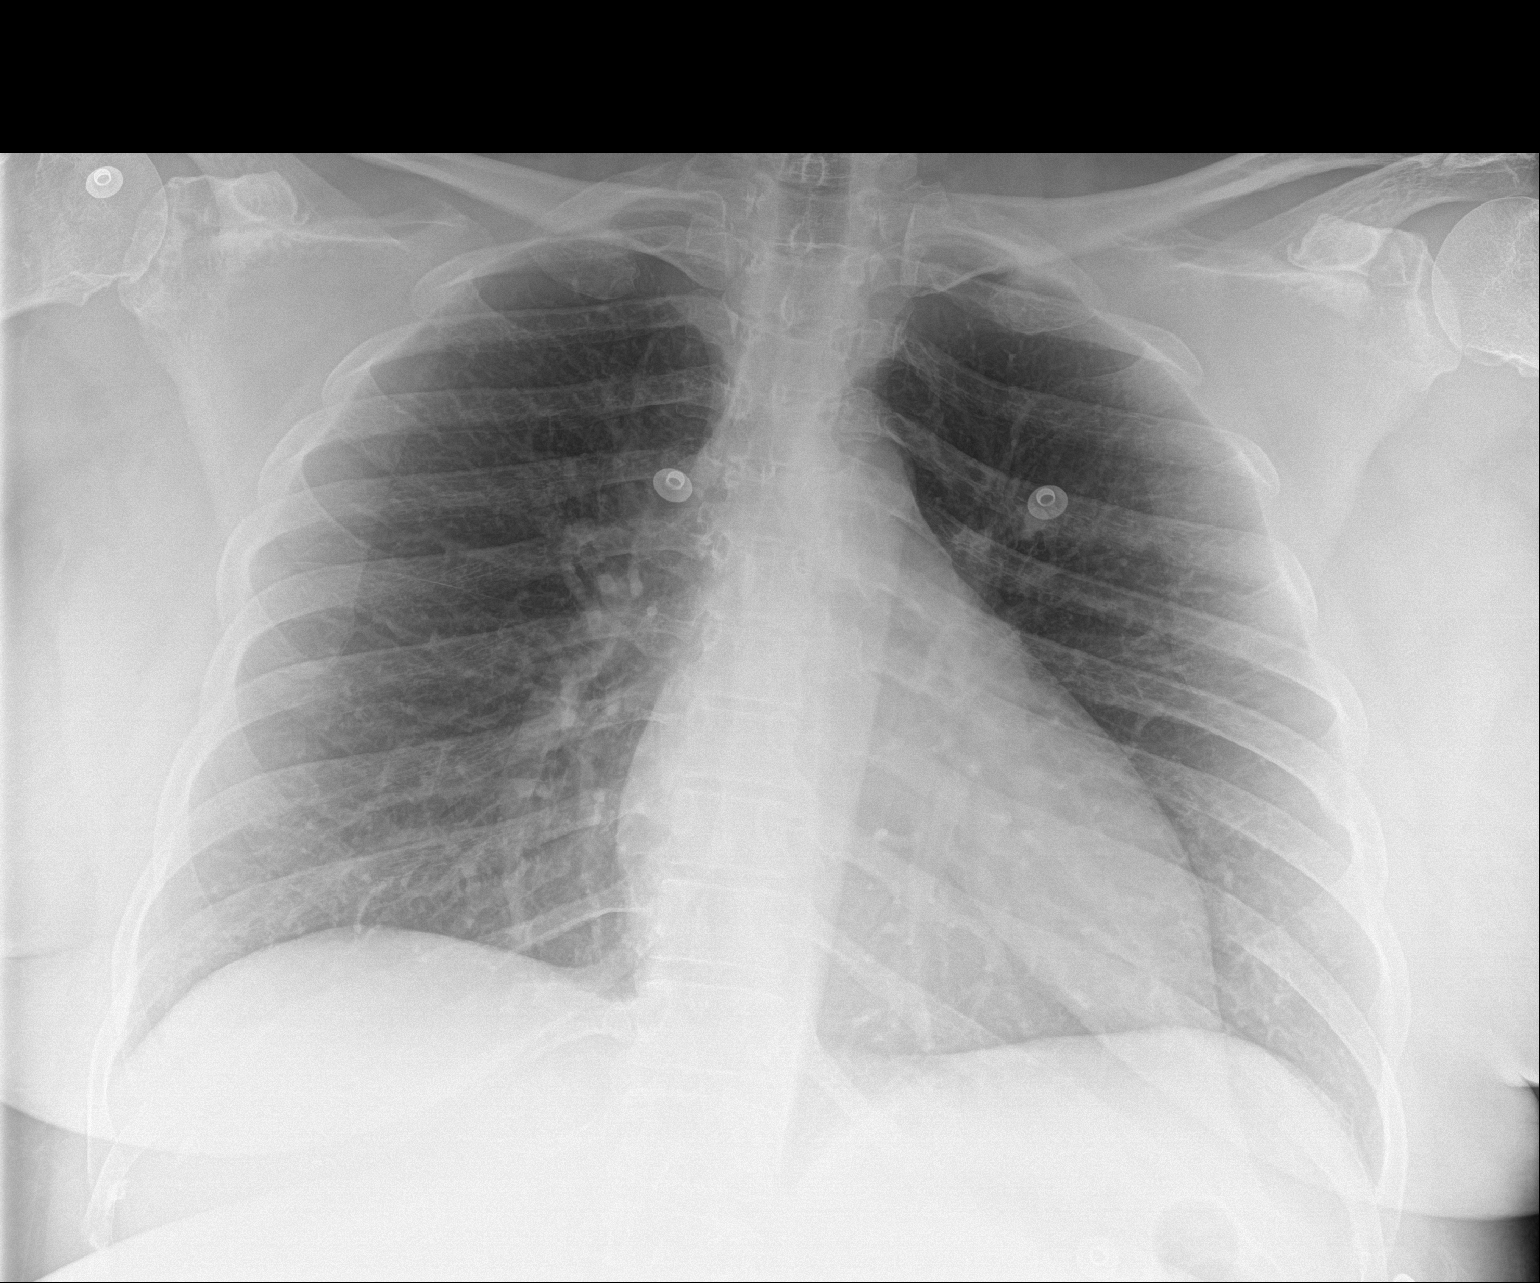

[1 of 1 positions shown; findings below may reference images not displayed]

FINDINGS: The heart size and mediastinal contours are within normal limits.
Both lungs are clear. The visualized skeletal structures are
unremarkable.
IMPRESSION: No active disease.

## 2021-09-30 MED ORDER — ACETAMINOPHEN 325 MG PO TABS
650.0000 mg | ORAL_TABLET | Freq: Once | ORAL | Status: AC
Start: 2021-09-30 — End: 2021-09-30
  Administered 2021-09-30: 650 mg via ORAL

## 2021-09-30 MED ORDER — LORAZEPAM 1 MG PO TABS: 1.0000 mg | ORAL_TABLET | Freq: Two times a day (BID) | ORAL | 0 refills | Status: DC

## 2021-09-30 NOTE — ED Provider Notes (Signed)
Angela Hahn   CSN: [id number] Arrival date & time: 09/30/21  1723     History  Chief Complaint  Patient presents with   Tachycardia    Angela Hahn is a 46 y.o. female.  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 Pt attributes symptoms to anxiety. Pt was using ativan but was started on hydroxyzine by pcp one week ago. States symptoms started after using it.   The history is provided by the patient. No language interpreter was used.      Home Medications Prior to Admission medications   Medication Sig Start Date End Date Taking? Authorizing Provider  acetaminophen (TYLENOL) 500 MG tablet Take 1,000 mg by mouth every 6 (six) hours as needed for mild pain or headache.    [provider]  atorvastatin (LIPITOR) 40 MG tablet TAKE ONE TABLET BY MOUTH DAILY 08/23/21   Laurey Morale, MD  diltiazem (CARDIZEM CD) 120 MG 24 hr capsule TAKE ONE CAPSULE BY MOUTH DAILY 09/27/21   Tobb, Kardie, DO  hydrOXYzine (VISTARIL) 25 MG capsule Take 1 capsule (25 mg total) by mouth every 6 (six) hours as needed for anxiety. 09/09/21   Laurey Morale, MD  triamcinolone cream (KENALOG) 0.1 % Apply 1 application topically 2 (two) times daily. Patient taking differently: Apply 1 application topically 2 (two) times daily as needed (eczema flare up). 12/02/20   Laurey Morale, MD  Cetirizine HCl 10 MG CAPS Take 1 capsule (10 mg total) by mouth daily. 08/28/19 12/17/20  Wieters, Hallie C, PA-C      Allergies    Gadolinium derivatives, Hydrocodone, and Tramadol    Review of Systems   Review of Systems  Constitutional:  Negative for chills and fever.  HENT:  Negative for ear pain and sore throat.   Eyes:  Negative for pain and visual disturbance.  Respiratory:  Negative for cough and shortness of breath.   Cardiovascular:  Positive for  palpitations. Negative for chest pain.  Gastrointestinal:  Negative for abdominal pain and vomiting.  Genitourinary:  Negative for dysuria and hematuria.  Musculoskeletal:  Negative for arthralgias and back pain.  Skin:  Negative for color change and rash.  Neurological:  Positive for light-headedness. Negative for seizures and syncope.  All other systems reviewed and are negative.  Physical Exam Updated Vital Signs BP 139/69 (BP Location: Right Arm)    Pulse 88    Temp 97.9 F (36.6 C) (Oral)    Resp 14    Ht 5\' 6"  (1.676 m)    Wt 107 kg    LMP 09/22/2021    SpO2 100%    BMI 38.07 kg/m  Physical Exam Vitals and nursing Hahn reviewed.  Constitutional:      General: She is not in acute distress.    Appearance: She is well-developed.  HENT:     Head: Normocephalic and atraumatic.  Eyes:     Conjunctiva/sclera: Conjunctivae normal.  Cardiovascular:     Rate and Rhythm: Normal rate and regular rhythm.     Heart sounds: No murmur heard. Pulmonary:     Effort: Pulmonary effort is normal. No respiratory distress.     Breath sounds: Normal breath sounds.  Abdominal:     Palpations: Abdomen is soft.     Tenderness: There is no abdominal tenderness.  Musculoskeletal:  General: No swelling.     Cervical back: Neck supple.  Skin:    General: Skin is warm and dry.     Capillary Refill: Capillary refill takes less than 2 seconds.  Neurological:     Mental Status: She is alert and oriented to person, place, and time.     GCS: GCS eye subscore is 4. GCS verbal subscore is 5. GCS motor subscore is 6.     Cranial Nerves: Cranial nerves 2-12 are intact.     Sensory: Sensation is intact.     Motor: Motor function is intact.     Coordination: Coordination is intact.     Gait: Gait is intact.  Psychiatric:        Mood and Affect: Mood normal.    ED Results / Procedures / Treatments   Labs (all labs ordered are listed, but only abnormal results are displayed) Labs Reviewed  BASIC  METABOLIC PANEL - Abnormal; Notable for the following components:      Result Value   Glucose, Bld 145 (*)    All other components within normal limits  CBC - Abnormal; Notable for the following components:   Hemoglobin 9.4 (*)    HCT 32.7 (*)    MCV 74.1 (*)    MCH 21.3 (*)    MCHC 28.7 (*)    All other components within normal limits  TSH  MAGNESIUM  TROPONIN I (HIGH SENSITIVITY)  TROPONIN I (HIGH SENSITIVITY)    EKG None  Radiology DG Chest Port 1 View  Result Date: 09/30/2021 CLINICAL DATA:  Tachycardia, chest pain EXAM: PORTABLE CHEST 1 VIEW COMPARISON:  06/23/2021 FINDINGS: The heart size and mediastinal contours are within normal limits. Both lungs are clear. The visualized skeletal structures are unremarkable. IMPRESSION: No active disease. Electronically Signed   By: Fidela Salisbury M.D.   On: 09/30/2021 19:02    Procedures Procedures    Medications Ordered in ED Medications - No data to display  ED Course/ Medical Decision Making/ A&P Clinical Course as of 09/30/21 2017  Fri Sep 30, 2021  2016 Hemoglobin(!): 9.4 [AG]    Clinical Course User Index [AG] Lianne Cure, DO                           Medical Decision Making Amount and/or Complexity of Data Reviewed Labs: ordered. Decision-making details documented in ED Course.   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  Palpitations: Concern for medication effects. Pt taken off ativan and started on hydralazine when symptoms began.   ECG stable with no ST segment elevation or depression. No arrhythmias. Stable QTC. CXR demonstrates no acute process. Stable electrolytes. No hyperthyroidism. Stable TSH. D-dimer negative. Low suspicion PE.   Patient in no distress and overall condition improved here in the ED. Detailed discussions were had with the patient regarding current findings, and need for  close f/u with PCP or on call doctor. The patient has been instructed to return immediately if the symptoms worsen in any way for re-evaluation. Patient verbalized understanding and is in agreement with current care plan. All questions answered prior to discharge.         Final Clinical Impression(s) / ED Diagnoses Final diagnoses:  Palpitations    Rx / DC Orders ED Discharge Orders     None  Campbell Stall P, DO 25/52/58 2243

## 2021-09-30 NOTE — Telephone Encounter (Signed)
Spoke with patient, over the last few days she has been experiencing severe anxiety, racing heart to point where chest was hurting, nervousness and feeling panic.   I informed patient to please head over to the ED or Urgent care for an assessment.    This has been the first week that she has taken this medication all week.       Patient stated that she had better results taking Lorazepam  and can a prescription be sent to pharmacy, and she will stop taking the Hydroxyzine. Please advise

## 2021-09-30 NOTE — ED Triage Notes (Signed)
Tachycardia anxious fatigue for the past 3-4 days stroke in October 2022.   Swallowing not normal last pm chest pain today lmp  4 days ago

## 2021-09-30 NOTE — ED Provider Triage Note (Signed)
Emergency Medicine Provider Triage Evaluation Note  Angela Hahn , a 46 y.o. female  was evaluated in triage.  Pt complains of tachycardia x3-4 days.  Has associated chest pain.  Has not tried medication for her symptoms.  She notes that she recently had a medication change for her anxiety prior to the onset of her symptoms.  Patient has a history of a stroke in October 2022 and she is followed by the neurologist.  She notes that her swallowing was not normal last night. Denies shortness of breath, abdominal pain, nausea, vomiting, fever, chills, bleeding.  She is going to rehab and denies any deficits, however, notes that she is working on her writing.  Review of Systems  Positive: As per HPI above Negative: As per HPI above  Physical Exam  BP (!) 148/119 (BP Location: Right Arm)    Pulse (!) 103    Temp 98.7 F (37.1 C) (Oral)    Resp 14    Ht 5\' 6"  (1.676 m)    Wt 107 kg    LMP 09/22/2021    SpO2 100%    BMI 38.07 kg/m  Gen:   Awake, no distress   Resp:  Normal effort  MSK:   Moves extremities without difficulty  Other:  No focal neurological deficits.  Negative pronator drift.  Strength and sensation intact to bilateral upper and lower extremities.  Grip strength 5/5 bilaterally.  Mild sternal chest wall tenderness to palpation.  No overlying skin changes.  Medical Decision Making  Medically screening exam initiated at 6:23 PM.  Appropriate orders placed.  PRARTHANA PARLIN was informed that the remainder of the evaluation will be completed by another provider, this initial triage assessment does not replace that evaluation, and the importance of remaining in the ED until their evaluation is complete.    Analise Glotfelty A, PA-C 09/30/21 1827

## 2021-09-30 NOTE — Telephone Encounter (Signed)
Patient called in to speak with someone regarding hydrOXYzine (VISTARIL) 25 MG capsule [[id number]] .   Patient could be contacted at [phone].  Please advise.

## 2021-10-03 MED ORDER — LORAZEPAM 1 MG PO TABS: 1.0000 mg | ORAL_TABLET | Freq: Three times a day (TID) | ORAL | 2 refills | Status: DC | PRN

## 2021-10-03 NOTE — Telephone Encounter (Signed)
Spoke with pt aware that Dr Sarajane Jews sent in new Rx for Lorazepam to her pharmacy, pt verbalized understanding

## 2021-10-03 NOTE — Telephone Encounter (Signed)
I refilled the Lorazepam

## 2021-10-04 ENCOUNTER — Ambulatory Visit: Payer: Medicaid Other | Admitting: Cardiology

## 2021-10-04 ENCOUNTER — Encounter: Payer: Self-pay | Admitting: Cardiology

## 2021-10-04 ENCOUNTER — Ambulatory Visit (INDEPENDENT_AMBULATORY_CARE_PROVIDER_SITE_OTHER): Payer: 59 | Admitting: Cardiology

## 2021-10-04 ENCOUNTER — Other Ambulatory Visit: Payer: Self-pay

## 2021-10-04 VITALS — BP 120/66 | HR 95 | Ht 66.0 in | Wt 236.4 lb

## 2021-10-04 DIAGNOSIS — N926 Irregular menstruation, unspecified: Secondary | ICD-10-CM

## 2021-10-04 DIAGNOSIS — Z8673 Personal history of transient ischemic attack (TIA), and cerebral infarction without residual deficits: Secondary | ICD-10-CM

## 2021-10-04 DIAGNOSIS — E669 Obesity, unspecified: Secondary | ICD-10-CM

## 2021-10-04 DIAGNOSIS — I4729 Other ventricular tachycardia: Secondary | ICD-10-CM

## 2021-10-04 DIAGNOSIS — I471 Supraventricular tachycardia: Secondary | ICD-10-CM

## 2021-10-04 MED ORDER — DILTIAZEM HCL ER COATED BEADS 180 MG PO CP24: 180.0000 mg | ORAL_CAPSULE | Freq: Every day | ORAL | 3 refills | Status: DC

## 2021-10-04 NOTE — Progress Notes (Signed)
Cardiology Office Note:    Date:  10/04/2021   ID:  BRISA AUTH, DOB 1975/10/15, MRN [id number]  PCP:  Laurey Morale, MD  Cardiologist:  Berniece Salines, DO  Electrophysiologist:  None   Referring MD: Laurey Morale, MD   " I had palpitations"  History of Present Illness:    Angela Hahn is a 46 y.o. 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    I saw the patient on July 04, 2021 after she had hospitalization for multiple strokes.  During that time she is being treated with aspirin and Plavix.  We planned a TEE which she had gotten since I saw her there were no evidence of any thrombus or PFO's.  She also wore a monitor which showed evidence of paroxysmal SVT and nonsustained ventricular tachycardia.  I started patient on Lopressor she did not tolerate that medication and then transition her to Cardizem.   She is here today for follow-up visit.  Since I saw the patient she has had multiple ER visits for palpitations.  She notes that she feels that her palpitations has been as a result of her change of antianxiety medications.     Past Medical History:  Diagnosis Date   Anemia    Chronic headaches    Dysmenorrhea    Heart murmur    dx'd since childhood   Stroke [facility])     Past Surgical History:  Procedure Laterality Date   BUBBLE STUDY  07/14/2021   Procedure: BUBBLE STUDY;  Surgeon: Skeet Latch, MD;  Location: [place];  Service: Cardiovascular;;   TEE WITHOUT CARDIOVERSION N/A 07/14/2021   Procedure: TRANSESOPHAGEAL ECHOCARDIOGRAM (TEE);  Surgeon: Skeet Latch, MD;  Location: [facility] ENDOSCOPY;  Service: Cardiovascular;  Laterality: N/A;   TUBAL LIGATION      Current Medications: Current Meds  Medication Sig   acetaminophen (TYLENOL) 500 MG tablet  Take 1,000 mg by mouth every 6 (six) hours as needed for mild pain or headache.   aspirin EC 81 MG tablet Take 81 mg by mouth daily. Swallow whole.   atorvastatin (LIPITOR) 40 MG tablet TAKE ONE TABLET BY MOUTH DAILY   diltiazem (CARDIZEM CD) 180 MG 24 hr capsule Take 1 capsule (180 mg total) by mouth daily.   hydrOXYzine (VISTARIL) 25 MG capsule Take 1 capsule (25 mg total) by mouth every 6 (six) hours as needed for anxiety.   LORazepam (ATIVAN) 1 MG tablet Take 1 tablet (1 mg total) by mouth every 8 (eight) hours as needed for anxiety.   triamcinolone cream (KENALOG) 0.1 % Apply 1 application topically 2 (two) times daily. (Patient taking differently: Apply 1 application topically 2 (two) times daily as needed (eczema flare up).)   [DISCONTINUED] diltiazem (CARDIZEM CD) 120 MG 24 hr capsule TAKE ONE CAPSULE BY MOUTH DAILY     Allergies:   Gadolinium derivatives, Hydrocodone, and Tramadol   Social History   Socioeconomic History   Marital status: Single    Spouse name: Not on file   Number of children: Not on file   Years of education: Not on file   Highest education level: Not on file  Occupational History   Not on file  Tobacco Use   Smoking status: Former    Types: Cigarettes    Quit date: 05/28/2014    Years  since quitting: 7.3   Smokeless tobacco: Never  Vaping Use   Vaping Use: Never used  Substance and Sexual Activity   Alcohol use: No    Alcohol/week: 0.0 standard drinks   Drug use: No   Sexual activity: Yes    Partners: Male    Birth control/protection: Surgical    Comment: Tubal  Other Topics Concern   Not on file  Social History Narrative   Not on file   Social Determinants of Health   Financial Resource Strain: Not on file  Food Insecurity: Not on file  Transportation Needs: Not on file  Physical Activity: Not on file  Stress: Not on file  Social Connections: Not on file     Family History: The patient's family history includes Arthritis in her  mother; Depression in her mother; Diabetes in her father and mother; Hypertension in her father and mother.  ROS:   Review of Systems  Constitution: Negative for decreased appetite, fever and weight gain.  HENT: Negative for congestion, ear discharge, hoarse voice and sore throat.   Eyes: Negative for discharge, redness, vision loss in right eye and visual halos.  Cardiovascular: Negative for chest pain, dyspnea on exertion, leg swelling, orthopnea and palpitations.  Respiratory: Negative for cough, hemoptysis, shortness of breath and snoring.   Endocrine: Negative for heat intolerance and polyphagia.  Hematologic/Lymphatic: Negative for bleeding problem. Does not bruise/bleed easily.  Skin: Negative for flushing, nail changes, rash and suspicious lesions.  Musculoskeletal: Negative for arthritis, joint pain, muscle cramps, myalgias, neck pain and stiffness.  Gastrointestinal: Negative for abdominal pain, bowel incontinence, diarrhea and excessive appetite.  Genitourinary: Negative for decreased libido, genital sores and incomplete emptying.  Neurological: Negative for brief paralysis, focal weakness, headaches and loss of balance.  Psychiatric/Behavioral: Negative for altered mental status, depression and suicidal ideas.  Allergic/Immunologic: Negative for HIV exposure and persistent infections.    EKGs/Labs/Other Studies Reviewed:    The following studies were reviewed today:   EKG:  The ekg ordered today demonstrates sinus rhythm, heart rate 95 bpm.  TTE 06/24/2021 IMPRESSIONS   1. Left ventricular ejection fraction, by estimation, is 60 to 65%. The left ventricle has normal function. The left ventricle has no regional wall motion abnormalities. Left ventricular diastolic parameters were normal.   2. Right ventricular systolic function is normal. The right ventricular size is normal.   3. The mitral valve is grossly normal. Trivial mitral valve regurgitation.   4. The aortic valve  is normal in structure. Aortic valve regurgitation is not visualized. No aortic stenosis is present.   FINDINGS   Left Ventricle: Left ventricular ejection fraction, by estimation, is 60 to 65%. The left ventricle has normal function. The left ventricle has no regional wall motion abnormalities. The left ventricular internal cavity size was normal in size. There is   no left ventricular hypertrophy. Left ventricular diastolic parameters were normal.   Right Ventricle: The right ventricular size is normal. Right vetricular  wall thickness was not well visualized. Right ventricular systolic  function is normal.   Left Atrium: Left atrial size was normal in size.   Right Atrium: Right atrial size was normal in size.   Pericardium: There is no evidence of pericardial effusion.   Mitral Valve: The mitral valve is grossly normal. Trivial mitral valve  regurgitation.   Tricuspid Valve: The tricuspid valve is grossly normal. Tricuspid valve  regurgitation is trivial.   Aortic Valve: The aortic valve is normal in structure. Aortic valve  regurgitation  is not visualized. No aortic stenosis is present. Aortic  valve peak gradient measures 8.1 mmHg.   Pulmonic Valve: The pulmonic valve was not well visualized. Pulmonic valve  regurgitation is trivial.   Aorta: The aortic root and ascending aorta are structurally normal, with  no evidence of dilitation.   IAS/Shunts: The interatrial septum was not well visualized  Recent Labs: 08/26/2021: ALT 18 09/30/2021: BUN 7; Creatinine, Ser 0.68; Hemoglobin 9.4; Magnesium 2.0; Platelets 341; Potassium 3.6; Sodium 138; TSH 2.079  Recent Lipid Panel    Component Value Date/Time   CHOL 163 07/27/2021 1212   TRIG 68.0 07/27/2021 1212   HDL 71.80 07/27/2021 1212   CHOLHDL 2 07/27/2021 1212   VLDL 13.6 07/27/2021 1212   LDLCALC 78 07/27/2021 1212    Physical Exam:    VS:  BP 120/66 (BP Location: Left Arm, Patient Position: Sitting)    Pulse 95     Ht 5\' 6"  (1.676 m)    Wt 236 lb 6.4 oz (107.2 kg)    LMP 09/22/2021    SpO2 98%    BMI 38.16 kg/m     Wt Readings from Last 3 Encounters:  10/04/21 236 lb 6.4 oz (107.2 kg)  09/30/21 235 lb 14.3 oz (107 kg)  09/05/21 236 lb (107 kg)     GEN: Well nourished, well developed in no acute distress HEENT: Normal NECK: No JVD; No carotid bruits LYMPHATICS: No lymphadenopathy CARDIAC: S1S2 noted,RRR, no murmurs, rubs, gallops RESPIRATORY:  Clear to auscultation without rales, wheezing or rhonchi  ABDOMEN: Soft, non-tender, non-distended, +bowel sounds, no guarding. EXTREMITIES: No edema, No cyanosis, no clubbing MUSCULOSKELETAL:  No deformity  SKIN: Warm and dry NEUROLOGIC:  Alert and oriented x 3, non-focal PSYCHIATRIC:  Normal affect, good insight  ASSESSMENT:    1. PSVT (paroxysmal supraventricular tachycardia) ([place])   2. NSVT (nonsustained ventricular tachycardia)   3. Abnormal menses   4. History of CVA (cerebrovascular accident)   5. Obesity (BMI 30-39.9)    PLAN:    She still is experiencing today palpitation I will increase her Cardizem to 180 mg daily. She has not shared with me that she has been having irregular menses some can refer the patient to OB/GYN. In terms of her recent CVA continue her aspirin 81 mg daily along with her atorvastatin 40 mg daily. The patient understands the need to lose weight with diet and exercise. We have discussed specific strategies for this.   The patient is in agreement with the above plan. The patient left the office in stable condition.  The patient will follow up in   Medication Adjustments/Labs and Tests Ordered: Current medicines are reviewed at length with the patient today.  Concerns regarding medicines are outlined above.  Orders Placed This Encounter  Procedures   Ambulatory referral to Obstetrics / Gynecology   EKG 12-Lead   Meds ordered this encounter  Medications   diltiazem (CARDIZEM CD) 180 MG 24 hr capsule    Sig:  Take 1 capsule (180 mg total) by mouth daily.    Dispense:  90 capsule    Refill:  3    Increase dose to 180 mg once daily    Patient Instructions  Medication Instructions:  Your physician has recommended you make the following change in your medication:  INCREASE: Cardizem 180 mg once daily *If you need a refill on your cardiac medications before your next appointment, please call your pharmacy*   Lab Work: None If you have labs (blood work) drawn  today and your tests are completely normal, you will receive your results only by: MyChart Message (if you have MyChart) OR A paper copy in the mail If you have any lab test that is abnormal or we need to change your treatment, we will call you to review the results.   Testing/Procedures: None   Follow-Up: At [facility], you and your health needs are our priority.  As part of our continuing mission to provide you with exceptional heart care, we have created designated Provider Care Teams.  These Care Teams include your primary Cardiologist (physician) and Advanced Practice Providers (APPs -  Physician Assistants and Nurse Practitioners) who all work together to provide you with the care you need, when you need it.  We recommend signing up for the patient portal called "MyChart".  Sign up information is provided on this After Visit Summary.  MyChart is used to connect with patients for Virtual Visits (Telemedicine).  Patients are able to view lab/test results, encounter notes, upcoming appointments, etc.  Non-urgent messages can be sent to your provider as well.   To learn more about what you can do with MyChart, go to [email or web address].    Your next appointment:   6 month(s)  The format for your next appointment:   In Person  Provider:   Berniece Salines, DO     Other Instructions  Mediterranean Diet A Mediterranean diet refers to food and lifestyle choices that are based on the traditions of countries located on the  [place]. It focuses on eating more fruits, vegetables, whole grains, beans, nuts, seeds, and heart-healthy fats, and eating less dairy, meat, eggs, and processed foods with added sugar, salt, and fat. This way of eating has been shown to help prevent certain conditions and improve outcomes for people who have chronic diseases, like kidney disease and heart disease. What are tips for following this plan? Reading food labels Check the serving size of packaged foods. For foods such as rice and pasta, the serving size refers to the amount of cooked product, not dry. Check the total fat in packaged foods. Avoid foods that have saturated fat or trans fats. Check the ingredient list for added sugars, such as corn syrup. Shopping  Buy a variety of foods that offer a balanced diet, including: Fresh fruits and vegetables (produce). Grains, beans, nuts, and seeds. Some of these may be available in unpackaged forms or large amounts (in bulk). Fresh seafood. Poultry and eggs. Low-fat dairy products. Buy whole ingredients instead of prepackaged foods. Buy fresh fruits and vegetables in-season from local farmers markets. Buy plain frozen fruits and vegetables. If you do not have access to quality fresh seafood, buy precooked frozen shrimp or canned fish, such as tuna, salmon, or sardines. Stock your pantry so you always have certain foods on hand, such as olive oil, canned tuna, canned tomatoes, rice, pasta, and beans. Cooking Cook foods with extra-virgin olive oil instead of using butter or other vegetable oils. Have meat as a side dish, and have vegetables or grains as your main dish. This means having meat in small portions or adding small amounts of meat to foods like pasta or stew. Use beans or vegetables instead of meat in common dishes like chili or lasagna. Experiment with different cooking methods. Try roasting, broiling, steaming, and sauting vegetables. Add frozen vegetables to soups,  stews, pasta, or rice. Add nuts or seeds for added healthy fats and plant protein at each meal. You can add these to yogurt, salads,  or vegetable dishes. Marinate fish or vegetables using olive oil, lemon juice, garlic, and fresh herbs. Meal planning Plan to eat one vegetarian meal one day each week. Try to work up to two vegetarian meals, if possible. Eat seafood two or more times a week. Have healthy snacks readily available, such as: Vegetable sticks with hummus. Greek yogurt. Fruit and nut trail mix. Eat balanced meals throughout the week. This includes: Fruit: 2-3 servings a day. Vegetables: 4-5 servings a day. Low-fat dairy: 2 servings a day. Fish, poultry, or lean meat: 1 serving a day. Beans and legumes: 2 or more servings a week. Nuts and seeds: 1-2 servings a day. Whole grains: 6-8 servings a day. Extra-virgin olive oil: 3-4 servings a day. Limit red meat and sweets to only a few servings a month. Lifestyle  Cook and eat meals together with your family, when possible. Drink enough fluid to keep your urine pale yellow. Be physically active every day. This includes: Aerobic exercise like running or swimming. Leisure activities like gardening, walking, or housework. Get 7-8 hours of sleep each night. If recommended by your health care provider, drink red wine in moderation. This means 1 glass a day for nonpregnant women and 2 glasses a day for men. A glass of wine equals 5 oz (150 mL). What foods should I eat? Fruits Apples. Apricots. Avocado. Berries. Bananas. Cherries. Dates. Figs. Grapes. Lemons. Melon. Oranges. Peaches. Plums. Pomegranate. Vegetables Artichokes. Beets. Broccoli. Cabbage. Carrots. Eggplant. Green beans. Chard. Kale. Spinach. Onions. Leeks. Peas. Squash. Tomatoes. Peppers. Radishes. Grains Whole-grain pasta. Brown rice. Bulgur wheat. Polenta. Couscous. Whole-wheat bread. [place]. Meats and other proteins Beans. Almonds. Sunflower seeds. Pine  nuts. Peanuts. [place]. Salmon. Scallops. Shrimp. [place]. Tilapia. Clams. Oysters. Eggs. Poultry without skin. Dairy Low-fat milk. Cheese. Greek yogurt. Fats and oils Extra-virgin olive oil. Avocado oil. Grapeseed oil. Beverages Water. Red wine. Herbal tea. Sweets and desserts Greek yogurt with honey. Baked apples. Poached pears. Trail mix. Seasonings and condiments Basil. Cilantro. Coriander. Cumin. Mint. Parsley. Sage. Rosemary. Tarragon. Garlic. Oregano. Thyme. Pepper. Balsamic vinegar. Tahini. Hummus. Tomato sauce. Olives. Mushrooms. The items listed above may not be a complete list of foods and beverages you can eat. Contact a dietitian for more information. What foods should I limit? This is a list of foods that should be eaten rarely or only on special occasions. Fruits Fruit canned in syrup. Vegetables Deep-fried potatoes (french fries). Grains Prepackaged pasta or rice dishes. Prepackaged cereal with added sugar. Prepackaged snacks with added sugar. Meats and other proteins Beef. Pork. Lamb. Poultry with skin. Hot dogs. Berniece Salines. Dairy Ice cream. Sour cream. Whole milk. Fats and oils Butter. Canola oil. Vegetable oil. Beef fat (tallow). Lard. Beverages Juice. Sugar-sweetened soft drinks. Beer. Liquor and spirits. Sweets and desserts Cookies. Cakes. Pies. Candy. Seasonings and condiments Mayonnaise. Pre-made sauces and marinades. The items listed above may not be a complete list of foods and beverages you should limit. Contact a dietitian for more information. Summary The Mediterranean diet includes both food and lifestyle choices. Eat a variety of fresh fruits and vegetables, beans, nuts, seeds, and whole grains. Limit the amount of red meat and sweets that you eat. If recommended by your health care provider, drink red wine in moderation. This means 1 glass a day for nonpregnant women and 2 glasses a day for men. A glass of wine equals 5 oz (150 mL). This information is not  intended to replace advice given to you by your health care provider. Make sure you  discuss any questions you have with your health care provider. Document Revised: 09/19/2019 Document Reviewed: 07/17/2019 Elsevier Patient Education  2022 [employer].    Adopting a Healthy Lifestyle.  Know what a healthy weight is for you (roughly BMI <25) and aim to maintain this   Aim for 7+ servings of fruits and vegetables daily   65-80+ fluid ounces of water or unsweet tea for healthy kidneys   Limit to max 1 drink of alcohol per day; avoid smoking/tobacco   Limit animal fats in diet for cholesterol and heart health - choose grass fed whenever available   Avoid highly processed foods, and foods high in saturated/trans fats   Aim for low stress - take time to unwind and care for your mental health   Aim for 150 min of moderate intensity exercise weekly for heart health, and weights twice weekly for bone health   Aim for 7-9 hours of sleep daily   When it comes to diets, agreement about the perfect plan isnt easy to find, even among the experts. Experts at the [employer] developed an idea known as the Healthy Eating Plate. Just imagine a plate divided into logical, healthy portions.   The emphasis is on diet quality:   Load up on vegetables and fruits - one-half of your plate: Aim for color and variety, and remember that potatoes dont count.   Go for whole grains - one-quarter of your plate: Whole wheat, barley, wheat berries, quinoa, oats, brown rice, and foods made with them. If you want pasta, go with whole wheat pasta.   Protein power - one-quarter of your plate: Fish, chicken, beans, and nuts are all healthy, versatile protein sources. Limit red meat.   The diet, however, does go beyond the plate, offering a few other suggestions.   Use healthy plant oils, such as olive, canola, soy, corn, sunflower and peanut. Check the labels, and avoid partially hydrogenated  oil, which have unhealthy trans fats.   If youre thirsty, drink water. Coffee and tea are good in moderation, but skip sugary drinks and limit milk and dairy products to one or two daily servings.   The type of carbohydrate in the diet is more important than the amount. Some sources of carbohydrates, such as vegetables, fruits, whole grains, and beans-are healthier than others.   Finally, stay active  Signed, Berniece Salines, DO  10/04/2021 9:54 AM    [place]

## 2021-10-04 NOTE — Patient Instructions (Addendum)
Medication Instructions:  Your physician has recommended you make the following change in your medication:  INCREASE: Cardizem 180 mg once daily *If you need a refill on your cardiac medications before your next appointment, please call your pharmacy*   Lab Work: None If you have labs (blood work) drawn today and your tests are completely normal, you will receive your results only by: [place] (if you have MyChart) OR A paper copy in the mail If you have any lab test that is abnormal or we need to change your treatment, we will call you to review the results.   Testing/Procedures: None   Follow-Up: At [facility], you and your health needs are our priority.  As part of our continuing mission to provide you with exceptional heart care, we have created designated Provider Care Teams.  These Care Teams include your primary Cardiologist (physician) and Advanced Practice Providers (APPs -  Physician Assistants and Nurse Practitioners) who all work together to provide you with the care you need, when you need it.  We recommend signing up for the patient portal called "MyChart".  Sign up information is provided on this After Visit Summary.  MyChart is used to connect with patients for Virtual Visits (Telemedicine).  Patients are able to view lab/test results, encounter notes, upcoming appointments, etc.  Non-urgent messages can be sent to your provider as well.   To learn more about what you can do with MyChart, go to [email or web address].    Your next appointment:   6 month(s)  The format for your next appointment:   In Person  Provider:   Berniece Salines, DO     Other Instructions  Mediterranean Diet A Mediterranean diet refers to food and lifestyle choices that are based on the traditions of countries located on the [place]. It focuses on eating more fruits, vegetables, whole grains, beans, nuts, seeds, and heart-healthy fats, and eating less dairy, meat, eggs, and  processed foods with added sugar, salt, and fat. This way of eating has been shown to help prevent certain conditions and improve outcomes for people who have chronic diseases, like kidney disease and heart disease. What are tips for following this plan? Reading food labels Check the serving size of packaged foods. For foods such as rice and pasta, the serving size refers to the amount of cooked product, not dry. Check the total fat in packaged foods. Avoid foods that have saturated fat or trans fats. Check the ingredient list for added sugars, such as corn syrup. Shopping  Buy a variety of foods that offer a balanced diet, including: Fresh fruits and vegetables (produce). Grains, beans, nuts, and seeds. Some of these may be available in unpackaged forms or large amounts (in bulk). Fresh seafood. Poultry and eggs. Low-fat dairy products. Buy whole ingredients instead of prepackaged foods. Buy fresh fruits and vegetables in-season from local farmers markets. Buy plain frozen fruits and vegetables. If you do not have access to quality fresh seafood, buy precooked frozen shrimp or canned fish, such as tuna, salmon, or sardines. Stock your pantry so you always have certain foods on hand, such as olive oil, canned tuna, canned tomatoes, rice, pasta, and beans. Cooking Cook foods with extra-virgin olive oil instead of using butter or other vegetable oils. Have meat as a side dish, and have vegetables or grains as your main dish. This means having meat in small portions or adding small amounts of meat to foods like pasta or stew. Use beans or vegetables instead of  meat in common dishes like chili or lasagna. Experiment with different cooking methods. Try roasting, broiling, steaming, and sauting vegetables. Add frozen vegetables to soups, stews, pasta, or rice. Add nuts or seeds for added healthy fats and plant protein at each meal. You can add these to yogurt, salads, or vegetable  dishes. Marinate fish or vegetables using olive oil, lemon juice, garlic, and fresh herbs. Meal planning Plan to eat one vegetarian meal one day each week. Try to work up to two vegetarian meals, if possible. Eat seafood two or more times a week. Have healthy snacks readily available, such as: Vegetable sticks with hummus. Greek yogurt. Fruit and nut trail mix. Eat balanced meals throughout the week. This includes: Fruit: 2-3 servings a day. Vegetables: 4-5 servings a day. Low-fat dairy: 2 servings a day. Fish, poultry, or lean meat: 1 serving a day. Beans and legumes: 2 or more servings a week. Nuts and seeds: 1-2 servings a day. Whole grains: 6-8 servings a day. Extra-virgin olive oil: 3-4 servings a day. Limit red meat and sweets to only a few servings a month. Lifestyle  Cook and eat meals together with your family, when possible. Drink enough fluid to keep your urine pale yellow. Be physically active every day. This includes: Aerobic exercise like running or swimming. Leisure activities like gardening, walking, or housework. Get 7-8 hours of sleep each night. If recommended by your health care provider, drink red wine in moderation. This means 1 glass a day for nonpregnant women and 2 glasses a day for men. A glass of wine equals 5 oz (150 mL). What foods should I eat? Fruits Apples. Apricots. Avocado. Berries. Bananas. Cherries. Dates. Figs. Grapes. Lemons. Melon. Oranges. Peaches. Plums. Pomegranate. Vegetables Artichokes. Beets. Broccoli. Cabbage. Carrots. Eggplant. Green beans. Chard. Kale. Spinach. Onions. Leeks. Peas. Squash. Tomatoes. Peppers. Radishes. Grains Whole-grain pasta. Brown rice. Bulgur wheat. Polenta. Couscous. Whole-wheat bread. [place]. Meats and other proteins Beans. Almonds. Sunflower seeds. Pine nuts. Peanuts. [place]. Salmon. Scallops. Shrimp. [place]. Tilapia. Clams. Oysters. Eggs. Poultry without skin. Dairy Low-fat milk. Cheese. Greek  yogurt. Fats and oils Extra-virgin olive oil. Avocado oil. Grapeseed oil. Beverages Water. Red wine. Herbal tea. Sweets and desserts Greek yogurt with honey. Baked apples. Poached pears. Trail mix. Seasonings and condiments Basil. Cilantro. Coriander. Cumin. Mint. Parsley. Sage. Rosemary. Tarragon. Garlic. Oregano. Thyme. Pepper. Balsamic vinegar. Tahini. Hummus. Tomato sauce. Olives. Mushrooms. The items listed above may not be a complete list of foods and beverages you can eat. Contact a dietitian for more information. What foods should I limit? This is a list of foods that should be eaten rarely or only on special occasions. Fruits Fruit canned in syrup. Vegetables Deep-fried potatoes (french fries). Grains Prepackaged pasta or rice dishes. Prepackaged cereal with added sugar. Prepackaged snacks with added sugar. Meats and other proteins Beef. Pork. Lamb. Poultry with skin. Hot dogs. Berniece Salines. Dairy Ice cream. Sour cream. Whole milk. Fats and oils Butter. Canola oil. Vegetable oil. Beef fat (tallow). Lard. Beverages Juice. Sugar-sweetened soft drinks. Beer. Liquor and spirits. Sweets and desserts Cookies. Cakes. Pies. Candy. Seasonings and condiments Mayonnaise. Pre-made sauces and marinades. The items listed above may not be a complete list of foods and beverages you should limit. Contact a dietitian for more information. Summary The Mediterranean diet includes both food and lifestyle choices. Eat a variety of fresh fruits and vegetables, beans, nuts, seeds, and whole grains. Limit the amount of red meat and sweets that you eat. If recommended by your health care  provider, drink red wine in moderation. This means 1 glass a day for nonpregnant women and 2 glasses a day for men. A glass of wine equals 5 oz (150 mL). This information is not intended to replace advice given to you by your health care provider. Make sure you discuss any questions you have with your health care  provider. Document Revised: 09/19/2019 Document Reviewed: 07/17/2019 Elsevier Patient Education  2022 [employer].

## 2021-10-06 ENCOUNTER — Other Ambulatory Visit: Payer: Self-pay

## 2021-10-06 ENCOUNTER — Telehealth: Payer: Self-pay | Admitting: Family Medicine

## 2021-10-06 MED ORDER — TRIAMCINOLONE ACETONIDE 0.1 % EX CREA: 1.0000 "application " | TOPICAL_CREAM | Freq: Two times a day (BID) | CUTANEOUS | 1 refills | Status: DC

## 2021-10-06 NOTE — Telephone Encounter (Signed)
Refill sent to [facility]

## 2021-10-06 NOTE — Telephone Encounter (Signed)
Pt is calling and needs a refill on triamcinolone cream (KENALOG) 0.1 %  [facility] [id number] [place], [place] Phone:  [phone]  Fax:  [phone]

## 2021-10-11 ENCOUNTER — Institutional Professional Consult (permissible substitution): Payer: Self-pay | Admitting: Neurology

## 2021-10-20 ENCOUNTER — Other Ambulatory Visit: Payer: Self-pay | Admitting: Family Medicine

## 2021-10-20 DIAGNOSIS — E785 Hyperlipidemia, unspecified: Secondary | ICD-10-CM

## 2021-10-27 ENCOUNTER — Other Ambulatory Visit: Payer: Self-pay | Admitting: Cardiology

## 2021-10-28 ENCOUNTER — Encounter: Payer: Self-pay | Admitting: Neurology

## 2021-10-28 ENCOUNTER — Ambulatory Visit (INDEPENDENT_AMBULATORY_CARE_PROVIDER_SITE_OTHER): Payer: 59 | Admitting: Neurology

## 2021-10-28 VITALS — BP 130/80 | HR 81 | Ht 66.0 in | Wt 233.4 lb

## 2021-10-28 DIAGNOSIS — Z8673 Personal history of transient ischemic attack (TIA), and cerebral infarction without residual deficits: Secondary | ICD-10-CM | POA: Diagnosis not present

## 2021-10-28 DIAGNOSIS — R519 Headache, unspecified: Secondary | ICD-10-CM

## 2021-10-28 DIAGNOSIS — R0681 Apnea, not elsewhere classified: Secondary | ICD-10-CM | POA: Diagnosis not present

## 2021-10-28 DIAGNOSIS — Z820 Family history of epilepsy and other diseases of the nervous system: Secondary | ICD-10-CM

## 2021-10-28 DIAGNOSIS — R0683 Snoring: Secondary | ICD-10-CM

## 2021-10-28 DIAGNOSIS — E669 Obesity, unspecified: Secondary | ICD-10-CM | POA: Diagnosis not present

## 2021-10-28 DIAGNOSIS — G4719 Other hypersomnia: Secondary | ICD-10-CM

## 2021-10-28 NOTE — Patient Instructions (Signed)

## 2021-10-28 NOTE — Progress Notes (Signed)
Subjective:    Patient ID: Angela Hahn is a 46 y.o. female.  HPI    Star Age, MD, PhD [facility] [address], [address] [place], [place] [address]  Dear Angela Hahn,   I saw your patient, Angela Hahn, upon your kind request, in my Sleep clinic today for initial consultation of her sleep disorder, in particular, concern for underlying obstructive sleep apnea.  The patient is unaccompanied today.  As you know, [name] is a 46 year old right-handed woman with an underlying medical history of stroke, anemia, heart murmur, recurrent headaches, dysmenorrhea, and obesity, who reports snoring and excessive daytime somnolence, as well as witnessed apneas per mom's report.  I reviewed your office note from 07/27/2021.  Her Epworth sleepiness score is 12 out of 24, fatigue severity score is 33 out of 63.  The patient lives with her 4 year old son.  She has a strong family history of sleep apnea affecting her mom, maternal uncle and her brother.  She would be willing to get tested for sleep apnea and try a PAP machine.  She goes to bed generally between 8 and 10 PM and rise time is around 6 AM.  She has had anxiety, she currently takes lorazepam 1 mg in the mornings per her PCP.  She tried hydroxyzine but had side effects with it.  She is working on weight loss and has recently started intermittent fasting.  She drinks no daily caffeine, no alcohol currently and quit smoking some 2 years ago.  She works in the office in an administrative position.  She denies night to night nocturia but has had occasional morning headaches, she describes these as dull and achy, sometimes takes Tylenol.   Her Past Medical History Is Significant For: Past Medical History:  Diagnosis Date   Anemia    Chronic headaches    Dysmenorrhea    Heart murmur    dx'd since childhood   Stroke [facility])     Her Past Surgical History Is Significant For: Past Surgical History:  Procedure Laterality  Date   BUBBLE STUDY  07/14/2021   Procedure: BUBBLE STUDY;  Surgeon: Skeet Latch, MD;  Location: [facility];  Service: Cardiovascular;;   TEE WITHOUT CARDIOVERSION N/A 07/14/2021   Procedure: TRANSESOPHAGEAL ECHOCARDIOGRAM (TEE);  Surgeon: Skeet Latch, MD;  Location: [facility] ENDOSCOPY;  Service: Cardiovascular;  Laterality: N/A;   TUBAL LIGATION      Her Family History Is Significant For: Family History  Problem Relation Age of Onset   Arthritis Mother    Depression Mother    Hypertension Mother    Diabetes Mother    Hypertension Father    Diabetes Father     Her Social History Is Significant For: Social History   Socioeconomic History   Marital status: Single    Spouse name: Not on file   Number of children: 3   Years of education: Not on file   Highest education level: Some college, no degree  Occupational History   Not on file  Tobacco Use   Smoking status: Former    Types: Cigarettes    Quit date: 05/28/2014    Years since quitting: 7.4   Smokeless tobacco: Never  Vaping Use   Vaping Use: Never used  Substance and Sexual Activity   Alcohol use: No    Alcohol/week: 0.0 standard drinks   Drug use: No   Sexual activity: Yes    Partners: Male    Birth control/protection: Surgical    Comment:  [place]  Other Topics Concern   Not on file  Social History Narrative   Caffeine- might have 1 c coffee   Social Determinants of Health   Financial Resource Strain: Not on file  Food Insecurity: Not on file  Transportation Needs: Not on file  Physical Activity: Not on file  Stress: Not on file  Social Connections: Not on file    Her Allergies Are:  Allergies  Allergen Reactions   Gadolinium Derivatives Itching, Swelling and Cough   Hydrocodone Other (See Comments)    Itching, takes Benadryl with this medicine   Tramadol Other (See Comments)    headache  :   Her Current Medications Are:  Outpatient Encounter Medications as of 10/28/2021  Medication Sig    acetaminophen (TYLENOL) 500 MG tablet Take 1,000 mg by mouth every 6 (six) hours as needed for mild pain or headache.   aspirin EC 81 MG tablet Take 81 mg by mouth daily. Swallow whole.   atorvastatin (LIPITOR) 40 MG tablet TAKE ONE TABLET BY MOUTH DAILY   diltiazem (CARDIZEM CD) 120 MG 24 hr capsule TAKE ONE CAPSULE BY MOUTH DAILY   diltiazem (CARDIZEM CD) 180 MG 24 hr capsule Take 1 capsule (180 mg total) by mouth daily.   LORazepam (ATIVAN) 1 MG tablet Take 1 tablet (1 mg total) by mouth every 8 (eight) hours as needed for anxiety.   triamcinolone cream (KENALOG) 0.1 % Apply 1 application topically 2 (two) times daily.   hydrOXYzine (VISTARIL) 25 MG capsule Take 1 capsule (25 mg total) by mouth every 6 (six) hours as needed for anxiety. (Patient not taking: Reported on 10/28/2021)   [DISCONTINUED] Cetirizine HCl 10 MG CAPS Take 1 capsule (10 mg total) by mouth daily.   No facility-administered encounter medications on file as of 10/28/2021.  :   Review of Systems:  Out of a complete 14 point review of systems, all are reviewed and negative with the exception of these symptoms as listed below:   Review of Systems  Neurological:        Internal referral here alone for risk of OSA, history of stroke.    Objective:  Neurological Exam  Physical Exam Physical Examination:   Vitals:   10/28/21 1112  BP: 130/80  Pulse: 81    General Examination: The patient is a very pleasant 46 y.o. female in no acute distress. She appears well-developed and well-nourished and well groomed.   HEENT: Normocephalic, atraumatic, pupils are equal, round and reactive to light, extraocular tracking is good without limitation to gaze excursion or nystagmus noted. Hearing is grossly intact. Face is symmetric with normal facial animation. Speech is clear with no dysarthria noted. There is no hypophonia. There is no lip, neck/head, jaw or voice tremor. Neck is supple with full range of passive and active motion.  There are no carotid bruits on auscultation. Oropharynx exam reveals: mild mouth dryness, adequate dental hygiene with several teeth missing, moderate airway crowding secondary to tonsillar size of 2-3+ and prominent uvula, Mallampati class II, neck circumference of 14-7/8 inches, minimal overbite noted.  Tongue protrudes centrally and palate elevates symmetrically.   Chest: Clear to auscultation without wheezing, rhonchi or crackles noted.  Heart: S1+S2+0, regular and normal without murmurs, rubs or gallops noted.   Abdomen: Soft, non-tender and non-distended.  Extremities: There is no pitting edema in the distal lower extremities bilaterally.   Skin: Warm and dry without trophic changes noted.   Musculoskeletal: exam reveals no obvious joint deformities, reports mild  left knee discomfort.    Neurologically:  Mental status: The patient is awake, alert and oriented in all 4 spheres. Her immediate and remote memory, attention, language skills and fund of knowledge are appropriate. There is no evidence of aphasia, agnosia, apraxia or anomia. Speech is clear with normal prosody and enunciation. Thought process is linear. Mood is normal and affect is normal.  Cranial nerves II - XII are as described above under HEENT exam.  Motor exam: Normal bulk, strength and tone is noted. There is no tremor. Fine motor skills and coordination: grossly intact.  Cerebellar testing: No dysmetria or intention tremor. There is no truncal or gait ataxia.  Sensory exam: intact to light touch in the upper and lower extremities.  Gait, station and balance: She stands easily. No veering to one side is noted. No leaning to one side is noted. Posture is age-appropriate and stance is narrow based. Gait shows normal stride length and normal pace. No problems turning are noted.   Assessment and Plan:  In summary, LATANGELA MCCOMAS is a very pleasant 46 y.o.-year old female with a history and physical exam concerning for  obstructive sleep apnea (OSA). I had a long chat with the patient about my findings and the diagnosis of OSA, its prognosis and treatment options. We talked about medical treatments, surgical interventions and non-pharmacological approaches. I explained in particular the risks and ramifications of untreated moderate to severe OSA, especially with respect to developing cardiovascular disease down the Road, including congestive heart failure, difficult to treat hypertension, cardiac arrhythmias, or stroke. Even type 2 diabetes has, in part, been linked to untreated OSA. Symptoms of untreated OSA include daytime sleepiness, memory problems, mood irritability and mood disorder such as depression and anxiety, lack of energy, as well as recurrent headaches, especially morning headaches. We talked about trying to maintain a healthy lifestyle in general, as well as the importance of weight control. I recommended the following at this time: sleep study.  I outlined the differences between a laboratory attended sleep study versus home sleep test. I explained the sleep test procedure to the patient and also outlined possible surgical and non-surgical treatment options of OSA, including the use of a custom-made dental device (which would require a referral to a specialist dentist or oral surgeon), upper airway surgical options, such as traditional UPPP or a novel less invasive surgical option in the form of Inspire hypoglossal nerve stimulation (which would involve a referral to an ENT surgeon). I also explained the CPAP treatment option to the patient, who indicated that she would be willing to try CPAP if the need arises. I explained the importance of being compliant with PAP treatment, not only for insurance purposes but primarily to improve Her symptoms, and for the patient's long term health benefit, including to reduce Her cardiovascular risks. I answered all her questions today and the patient was in agreement. I plan  to see her back after the sleep study is completed and encouraged her to call with any interim questions, concerns, problems or updates.   Thank you very much for allowing me to participate in the care of this nice patient. If I can be of any further assistance to you please do not hesitate to call me at [phone].  Sincerely,   Star Age, MD, PhD

## 2021-11-03 ENCOUNTER — Ambulatory Visit: Payer: 59 | Admitting: Neurology

## 2021-11-03 ENCOUNTER — Encounter: Payer: Self-pay | Admitting: Neurology

## 2021-11-03 VITALS — BP 129/70 | HR 83 | Ht 66.0 in | Wt 239.0 lb

## 2021-11-03 DIAGNOSIS — Z9189 Other specified personal risk factors, not elsewhere classified: Secondary | ICD-10-CM

## 2021-11-03 DIAGNOSIS — I6990 Unspecified sequelae of unspecified cerebrovascular disease: Secondary | ICD-10-CM

## 2021-11-03 NOTE — Patient Instructions (Signed)
I had a long d/w patient about her recent stroke, risk for sleep apnoea,risk for recurrent stroke/TIAs, personally independently reviewed imaging studies and stroke evaluation results and answered questions.Continue aspirin 81 mg daily  for secondary stroke prevention and maintain strict control of hypertension with blood pressure goal below 130/90, diabetes with hemoglobin A1c goal below 6.5% and lipids with LDL cholesterol goal below 70 mg/dL. I also advised the patient to eat a healthy diet with plenty of whole grains, cereals, fruits and vegetables, exercise regularly and maintain ideal body weight .keep upcoming appointment for sleep apnea testing.  I complemented her on her recent healthy eating and weight loss plan.  Followup in the future with me in 1 year or call earlier if needed. Stroke Prevention Some medical conditions and behaviors can lead to a higher chance of having a stroke. You can help prevent a stroke by eating healthy, exercising, not smoking, and managing any medical conditions you have. Stroke is a leading cause of functional impairment. Primary prevention is particularly important because a majority of strokes are first-time events. Stroke changes the lives of not only those who experience a stroke but also their family and other caregivers. How can this condition affect me? A stroke is a medical emergency and should be treated right away. A stroke can lead to brain damage and can sometimes be life-threatening. If a person gets medical treatment right away, there is a better chance of surviving and recovering from a stroke. What can increase my risk? The following medical conditions may increase your risk of a stroke: Cardiovascular disease. High blood pressure (hypertension). Diabetes. High cholesterol. Sickle cell disease. Blood clotting disorders (hypercoagulable state). Obesity. Sleep disorders (obstructive sleep apnea). Other risk factors include: Being older than age  50. Having a history of blood clots, stroke, or mini-stroke (transient ischemic attack, TIA). Genetic factors, such as race, ethnicity, or a family history of stroke. Smoking cigarettes or using other tobacco products. Taking birth control pills, especially if you also use tobacco. Heavy use of alcohol or drugs, especially cocaine and methamphetamine. Physical inactivity. What actions can I take to prevent this? Manage your health conditions High cholesterol levels. Eating a healthy diet is important for preventing high cholesterol. If cholesterol cannot be managed through diet alone, you may need to take medicines. Take any prescribed medicines to control your cholesterol as told by your health care provider. Hypertension. To reduce your risk of stroke, try to keep your blood pressure below 130/80. Eating a healthy diet and exercising regularly are important for controlling blood pressure. If these steps are not enough to manage your blood pressure, you may need to take medicines. Take any prescribed medicines to control hypertension as told by your health care provider. Ask your health care provider if you should monitor your blood pressure at home. Have your blood pressure checked every year, even if your blood pressure is normal. Blood pressure increases with age and some medical conditions. Diabetes. Eating a healthy diet and exercising regularly are important parts of managing your blood sugar (glucose). If your blood sugar cannot be managed through diet and exercise, you may need to take medicines. Take any prescribed medicines to control your diabetes as told by your health care provider. Get evaluated for obstructive sleep apnea. Talk to your health care provider about getting a sleep evaluation if you snore a lot or have excessive sleepiness. Make sure that any other medical conditions you have, such as atrial fibrillation or atherosclerosis, are managed. Nutrition Follow  instructions from your health care provider about what to eat or drink to help manage your health condition. These instructions may include: Reducing your daily calorie intake. Limiting how much salt (sodium) you use to 1,500 milligrams (mg) each day. Using only healthy fats for cooking, such as olive oil, canola oil, or sunflower oil. Eating healthy foods. You can do this by: Choosing foods that are high in fiber, such as whole grains, and fresh fruits and vegetables. Eating at least 5 servings of fruits and vegetables a day. Try to fill one-half of your plate with fruits and vegetables at each meal. Choosing lean protein foods, such as lean cuts of meat, poultry without skin, fish, tofu, beans, and nuts. Eating low-fat dairy products. Avoiding foods that are high in sodium. This can help lower blood pressure. Avoiding foods that have saturated fat, trans fat, and cholesterol. This can help prevent high cholesterol. Avoiding processed and prepared foods. Counting your daily carbohydrate intake.  Lifestyle If you drink alcohol: Limit how much you have to: 0-1 drink a day for women who are not pregnant. 0-2 drinks a day for men. Know how much alcohol is in your drink. In the [place] one drink equals one 12 oz bottle of beer (341m), one 5 oz glass of wine (1425m, or one 1 oz glass of hard liquor (4474m Do not use any products that contain nicotine or tobacco. These products include cigarettes, chewing tobacco, and vaping devices, such as e-cigarettes. If you need help quitting, ask your health care provider. Avoid secondhand smoke. Do not use drugs. Activity  Try to stay at a healthy weight. Get at least 30 minutes of exercise on most days, such as: Fast walking. Biking. Swimming. Medicines Take over-the-counter and prescription medicines only as told by your health care provider. Aspirin or blood thinners (antiplatelets or anticoagulants) may be recommended to reduce your risk of  forming blood clots that can lead to stroke. Avoid taking birth control pills. Talk to your health care provider about the risks of taking birth control pills if: You are over 35 72ars old. You smoke. You get very bad headaches. You have had a blood clot. Where to find more information American Stroke Association: www.strokeassociation.org Get help right away if: You or a loved one has any symptoms of a stroke. "BE FAST" is an easy way to remember the main warning signs of a stroke: B - Balance. Signs are dizziness, sudden trouble walking, or loss of balance. E - Eyes. Signs are trouble seeing or a sudden change in vision. F - Face. Signs are sudden weakness or numbness of the face, or the face or eyelid drooping on one side. A - Arms. Signs are weakness or numbness in an arm. This happens suddenly and usually on one side of the body. S - Speech. Signs are sudden trouble speaking, slurred speech, or trouble understanding what people say. T - Time. Time to call emergency services. Write down what time symptoms started. You or a loved one has other signs of a stroke, such as: A sudden, severe headache with no known cause. Nausea or vomiting. Seizure. These symptoms may represent a serious problem that is an emergency. Do not wait to see if the symptoms will go away. Get medical help right away. Call your local emergency services (911 in the [place] Do not drive yourself to the hospital. Summary You can help to prevent a stroke by eating healthy, exercising, not smoking, limiting alcohol intake, and managing any medical conditions  you may have. Do not use any products that contain nicotine or tobacco. These include cigarettes, chewing tobacco, and vaping devices, such as e-cigarettes. If you need help quitting, ask your health care provider. Remember "BE FAST" for warning signs of a stroke. Get help right away if you or a loved one has any of these signs. This information is not intended to  replace advice given to you by your health care provider. Make sure you discuss any questions you have with your health care provider. Document Revised: 03/15/2020 Document Reviewed: 03/15/2020 Elsevier Patient Education  [employer].

## 2021-11-03 NOTE — Progress Notes (Signed)
Right [facility] [address]. [place] [address] [phone]       OFFICE FOLLOW UP VISIT NOTE  Angela Hahn Date of Birth:  12/20/1975 Medical Record Number:  [id number]   Referring MD:  Rosalin Hawking  Reason for Referral: Stroke  HPI: Initial visit 07/10/2021 [name] is a 46 year old African-American lady seen today for initial office consultation visit for stroke.  History is obtained from the patient, her aunt and daughter-in-law accompanying her and review of electronic medical records and opossum reviewed pertinent available imaging films in PACS. Patient has no significant past medical history except for anemia, dysmenorrhea chronic headaches.  She presented on 06/23/2021 with episode of lightheadedness and feeling about to faint when getting out of a restaurant.  This was transient and she felt all right.  She went home and overnight did all okay.  When she woke up she noticed lightheaded again with some palpitations chest pain.  She also had trouble opening the door not with her right hand and was unable to turn the doorknob.  She presented to the ER for evaluation at that time she complained of throbbing left frontal headache and some nausea.  CT scan that was unremarkable.  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  2D echo was unremarkable TEE showed no cardiac source of embolism or PFO.  TCD bubble study was negative.  Lower extremity venous Dopplers negative for DVT.  LDL cholesterol is 95 mg percent.  Hemoglobin A1c was 6.3.  Urine drug screen was negative.  ESR and ANA panel were negative.  Hypercoagulable panel lab was mostly negative except for isolated elevated IgM antiphospholipid antibody of 39 and slightly diminished protein S activity and antigen levels.  Patient denies any prior history  of deep vein thrombosis, pulmonary embolism or recurrent miscarriages.  She had been on birth control pills for a year as well as on weight loss pills phentermine for 5 months.  She has since discontinued these.  She was discharged on aspirin and Plavix for 3 weeks and she is currently on aspirin alone.  She was also started on Lipitor which is tolerating well without muscle aches and pains.  Her blood pressures under good control and today it is 130/79.  Patient states that her left hand weakness and clumsiness has improved though she still has some diminished fine motor skills.  She has no prior history of strokes, TIAs, migraines, palpitations, loss of consciousness, syncope, cardiac disease. Update 11/03/2021:: She returns for follow-up 07/27/2021.  Continues to do very well.  She has not had any recurrent stroke or TIA symptoms.  She is feeling a lot better. She has started intermittent fasting and eating.  She has eating red meat.  He intermittently has some balance difficulties and heaviness l of eft arm.  Mostly when it rains or when she is tired.  She remains on aspirin which is tolerating well without bruising or bleeding.  Blood pressure is under good control and today it is 129/70.  She remains on Lipitor which is tolerating well without muscle aches and pains.  She has been referred for a polysomnogram and is awaiting insurance company approval to schedule this.  Repeat lab work on 07/27/21 showed normal protein activity and antigen anticardiolipin antibodies showed reduced titer of IgM at 35  but yet elevated.  IgA and IgG anticardiolipin antibodies were normal.  In the presence of negative lupus anticoagulant and beta-2 glycoprotein levels this is of unclear significance ROS:   14 system review of systems is positive for weakness, tiredness numbness all other systems negative  PMH:  Past Medical History:  Diagnosis Date   Anemia    Chronic headaches    Dysmenorrhea    Heart murmur    dx'd  since childhood   Stroke [facility])     Social History:  Social History   Socioeconomic History   Marital status: Single    Spouse name: Not on file   Number of children: 3   Years of education: Not on file   Highest education level: Some college, no degree  Occupational History   Not on file  Tobacco Use   Smoking status: Former    Types: Cigarettes    Quit date: 05/28/2014    Years since quitting: 7.4   Smokeless tobacco: Never  Vaping Use   Vaping Use: Never used  Substance and Sexual Activity   Alcohol use: No    Alcohol/week: 0.0 standard drinks   Drug use: No   Sexual activity: Yes    Partners: Male    Birth control/protection: Surgical    Comment: Tubal  Other Topics Concern   Not on file  Social History Narrative   Caffeine- might have 1 c coffee   Social Determinants of Radio broadcast assistant Strain: Not on file  Food Insecurity: Not on file  Transportation Needs: Not on file  Physical Activity: Not on file  Stress: Not on file  Social Connections: Not on file  Intimate Partner Violence: Not on file    Medications:   Current Outpatient Medications on File Prior to Visit  Medication Sig Dispense Refill   acetaminophen (TYLENOL) 500 MG tablet Take 1,000 mg by mouth every 6 (six) hours as needed for mild pain or headache.     aspirin EC 81 MG tablet Take 81 mg by mouth daily. Swallow whole.     atorvastatin (LIPITOR) 40 MG tablet TAKE ONE TABLET BY MOUTH DAILY 30 tablet 2   diltiazem (CARDIZEM CD) 180 MG 24 hr capsule Take 1 capsule (180 mg total) by mouth daily. 90 capsule 3   LORazepam (ATIVAN) 1 MG tablet Take 1 tablet (1 mg total) by mouth every 8 (eight) hours as needed for anxiety. 90 tablet 2   triamcinolone cream (KENALOG) 0.1 % Apply 1 application topically 2 (two) times daily. 15 g 1   [DISCONTINUED] Cetirizine HCl 10 MG CAPS Take 1 capsule (10 mg total) by mouth daily. 15 capsule 0   No current facility-administered medications on file prior  to visit.    Allergies:   Allergies  Allergen Reactions   Gadolinium Derivatives Itching, Swelling and Cough   Hydrocodone Other (See Comments)    Itching, takes Benadryl with this medicine   Tramadol Other (See Comments)    headache    Physical Exam General: Mildly obese middle-aged African-American lady, seated, in no evident distress Head: head normocephalic and atraumatic.   Neck: supple with no carotid or supraclavicular bruits Cardiovascular: regular rate and rhythm, no murmurs Musculoskeletal: no deformity Skin:  no rash/petichiae Vascular:  Normal pulses all extremities  Neurologic Exam Mental Status: Awake and fully alert. Oriented to place and time. Recent and remote memory intact. Attention span, concentration and fund of knowledge appropriate. Mood and affect appropriate.  Cranial Nerves: Fundoscopic exam not  done Pupils equal, briskly reactive to light. Extraocular movements full without nystagmus. Visual fields full to confrontation. Hearing intact. Facial sensation intact. Face, tongue, palate moves normally and symmetrically.  Motor: Normal bulk and tone. Normal strength in all tested extremity muscles.  Diminished fine finger movements on the left.  Orbits right over left upper extremity. Sensory.: intact to touch , pinprick , position and vibratory sensation.  Coordination: Rapid alternating movements normal in all extremities. Finger-to-nose and heel-to-shin performed accurately bilaterally. Gait and Station: Arises from chair without difficulty. Stance is normal. Gait demonstrates normal stride length and balance . Able to heel, toe and tandem walk without difficulty.  Reflexes: 1+ and symmetric. Toes downgoing.   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  Vascular risk factors of obesity, at risk for sleep apnea, hyperlipidemia and recent use of birth control pills and phentermine  for weight loss.     PLAN:I had a long d/w patient about her recent stroke, risk for sleep apnoea,risk for recurrent stroke/TIAs, personally independently reviewed imaging studies and stroke evaluation results and answered questions.Continue aspirin 81 mg daily  for secondary stroke prevention and maintain strict control of hypertension with blood pressure goal below 130/90, diabetes with hemoglobin A1c goal below 6.5% and lipids with LDL cholesterol goal below 70 mg/dL. I also advised the patient to eat a healthy diet with plenty of whole grains, cereals, fruits and vegetables, exercise regularly and maintain ideal body weight .keep upcoming appointment for sleep apnea testing.  I complemented her on her recent healthy eating and weight loss plan.  Followup in the future with me in 1 year or call earlier if needed. Greater than 50% time during this 30 minute   visit was spent on counseling and coordination of care about cryptogenic stroke and discussion about evaluation and treatment answering questions. Antony Contras, MD  Note: This document was prepared with digital dictation and possible smart phrase technology. Any transcriptional errors that result from this process are unintentional.

## 2021-11-16 ENCOUNTER — Ambulatory Visit: Payer: Medicaid Other | Admitting: Cardiology

## 2021-11-23 ENCOUNTER — Ambulatory Visit (INDEPENDENT_AMBULATORY_CARE_PROVIDER_SITE_OTHER): Payer: 59 | Admitting: Neurology

## 2021-11-23 ENCOUNTER — Ambulatory Visit: Payer: Medicaid Other | Admitting: Cardiology

## 2021-11-23 DIAGNOSIS — E669 Obesity, unspecified: Secondary | ICD-10-CM

## 2021-11-23 DIAGNOSIS — G4733 Obstructive sleep apnea (adult) (pediatric): Secondary | ICD-10-CM | POA: Diagnosis not present

## 2021-11-23 DIAGNOSIS — Z8673 Personal history of transient ischemic attack (TIA), and cerebral infarction without residual deficits: Secondary | ICD-10-CM

## 2021-11-23 DIAGNOSIS — R519 Headache, unspecified: Secondary | ICD-10-CM

## 2021-11-23 DIAGNOSIS — G4719 Other hypersomnia: Secondary | ICD-10-CM

## 2021-11-23 DIAGNOSIS — R0683 Snoring: Secondary | ICD-10-CM

## 2021-11-23 DIAGNOSIS — R0681 Apnea, not elsewhere classified: Secondary | ICD-10-CM

## 2021-11-23 DIAGNOSIS — Z820 Family history of epilepsy and other diseases of the nervous system: Secondary | ICD-10-CM

## 2021-11-28 NOTE — Progress Notes (Signed)
See procedure note.

## 2021-11-29 NOTE — Addendum Note (Signed)
Addended by: Star Age on: 11/29/2021 04:42 PM ? ? Modules accepted: Orders ? ?

## 2021-11-29 NOTE — Procedures (Signed)
? ?  [facility] ? ?HOME SLEEP TEST (Watch PAT) REPORT ? ?STUDY DATE: 11/24/2021 ? ?DOB: [birth year] ? ?MRN: [id number] ? ?ORDERING CLINICIAN: Star Age, MD, PhD ?  ?REFERRING CLINICIAN: Dr. Leonie Man ? ?CLINICAL INFORMATION/HISTORY: 46 year old right-handed woman with an underlying medical history of stroke, anemia, heart murmur, recurrent headaches, dysmenorrhea, and obesity, who reports snoring and excessive daytime somnolence, as well as witnessed apneas per mom's report.  ? ?Epworth sleepiness score: 12/24. ? ?BMI: 37.6 kg/m? ? ?FINDINGS:  ? ?Sleep Summary:  ? ?Total Recording Time (hours, min): 8 hours, 54 minutes ? ?Total Sleep Time (hours, min):  7 hours, 12 minutes  ? ?Percent REM (%):    29%  ? ?Respiratory Indices:  ? ?Calculated pAHI (per hour):  19.5/hour        ? ?REM pAHI:    33.2/hour      ? ?NREM pAHI: 14/hour ? ?Oxygen Saturation Statistics:  ?  ?Oxygen Saturation (%) Mean: 94%  ? ?Minimum oxygen saturation (%):                 71%  ? ?O2 Saturation Range (%): 71-99%   ? ?O2 Saturation (minutes) <=88%: 7.8 min ? ?Pulse Rate Statistics:  ? ?Pulse Mean (bpm):    69/min   ? ?Pulse Range (39-97/min)  ? ?IMPRESSION: OSA (obstructive sleep apnea)  ? ?RECOMMENDATION:  ?This home sleep test demonstrates moderate obstructive sleep apnea with a total AHI of 19.5/hour and O2 nadir of 71% with time below or at 88% saturation of over 5 minutes for the night.  Mild to moderate snoring was detected.  Treatment with positive airway pressure is recommended. The patient will be advised to proceed with an autoPAP titration/trial at home for now. A full night titration study may be considered to optimize treatment settings, if needed down the road. Please note that untreated obstructive sleep apnea may carry additional perioperative morbidity. Patients with significant obstructive sleep apnea should receive perioperative PAP therapy and the surgeons and particularly the anesthesiologist should be informed  of the diagnosis and the severity of the sleep disordered breathing.  Alternative treatment options may include a dental device or surgical treatment with a hypoglossal nerve stimulator in selected candidates.  Concomitant weight loss is recommended. ?The patient should be cautioned not to drive, work at heights, or operate dangerous or heavy equipment when tired or sleepy. Review and reiteration of good sleep hygiene measures should be pursued with any patient. ?Other causes of the patient's symptoms, including circadian rhythm disturbances, an underlying mood disorder, medication effect and/or an underlying medical problem cannot be ruled out based on this test. Clinical correlation is recommended. The patient and her referring provider will be notified of the test results. The patient will be seen in follow up in sleep clinic at [facility]. ? ?I certify that I have reviewed the raw data recording prior to the issuance of this report in accordance with the standards of the American Academy of Sleep Medicine (AASM). ? ?INTERPRETING PHYSICIAN:  ? ?Star Age, MD, PhD  ?Board Certified in Neurology and Sleep Medicine ? ?[facility] ?[address], [address] ?[place], [place] [address] ?([phone] ? ? ? ? ? ? ? ? ? ? ? ? ? ? ? ? ?

## 2021-11-30 ENCOUNTER — Telehealth: Payer: Self-pay | Admitting: Neurology

## 2021-11-30 NOTE — Telephone Encounter (Signed)
Spoke with the patient and discussed her sleep study results.  Patient verbalized understanding that her home sleep test showed obstructive sleep apnea in the moderate range.  Patient is amenable to proceeding with AutoPap.  We discussed the difference between AutoPap and CPAP.  We also discussed the insurance compliance requirements which includes using the machine at least 4 hours at night and also being seen in the office for an initial follow-up between 31 and 90 days after set up.  Patient was scheduled for an appointment on June 21 at 9:45 AM.  Patient aware to arrive with her machine and power cord for data download.  Patient did not have a preference of DME company.  We will send to [facility] as they take her insurance and are located in the same city where she resides.  Patient is aware that we will give her a call to schedule the set up appointment.  They will fit her with a mask, show her how to use the machine, how to put the mask on and provide her with the supplies.  They will also troubleshoot any questions she may have regarding the machine.  Her questions were answered.  She would like to follow-up letter to be sent to her MyChart. ? ?Order, office note, sleep study result, and insurance info faxed to [place]. Received a receipt of confirmation.  Results sent to referring MD, Dr Leonie Man. Letter sent to pt via mychart.  ?

## 2021-11-30 NOTE — Telephone Encounter (Signed)
Star Age, MD  ?11/29/2021  4:42 PM EDT Back to Top  ?  ?Patient referred by Dr. Leonie Man, seen by me on 10/28/21, patient had a HST on 11/24/21.   ?  ?Please call and notify the patient that the recent home sleep test showed obstructive sleep apnea in the moderate range. I recommend treatment in the form of autoPAP, which means, that we don't have to bring her in for a sleep study with CPAP, but will let her start using a so called autoPAP machine at home, which is a CPAP-like machine with self-adjusting pressures. We will send the order to a local DME company (of her choice, or as per insurance requirement). The DME representative will fit her with a mask, educate her on how to use the machine, how to put the mask on, etc. I have placed an order in the chart. Please send the order, talk to patient, send report to referring MD. We will need a FU in sleep clinic for 10 weeks post-PAP set up, please arrange that with me or one of our NPs. Also reinforce the need for compliance with treatment. Thanks,  ?  ?Star Age, MD, PhD ?[facility])  ? ?

## 2021-11-30 NOTE — Telephone Encounter (Signed)
Pt said she was returning phone call about her sleep results. Would like a call back.  ?

## 2021-12-09 ENCOUNTER — Ambulatory Visit: Payer: Medicaid Other | Admitting: Cardiology

## 2021-12-12 ENCOUNTER — Encounter: Payer: 59 | Admitting: Obstetrics and Gynecology

## 2021-12-13 ENCOUNTER — Ambulatory Visit: Payer: Medicaid Other | Admitting: Cardiology

## 2021-12-13 ENCOUNTER — Ambulatory Visit: Payer: Self-pay | Admitting: Neurology

## 2021-12-15 ENCOUNTER — Encounter: Payer: Self-pay | Admitting: Cardiology

## 2021-12-27 ENCOUNTER — Telehealth: Payer: Self-pay | Admitting: *Deleted

## 2021-12-27 NOTE — Telephone Encounter (Signed)
Received fax from [place] that they have made multiple attempts to contact pt.  They will also send mailing to her as well.  I will send mychart visit and there phone # to call re: PAP therapy. ?

## 2022-01-10 ENCOUNTER — Encounter: Payer: Self-pay | Admitting: *Deleted

## 2022-01-11 NOTE — Telephone Encounter (Signed)
I called [employer] and relayed that I spoke to pt and she relayed lost her job and is applying for unemployment.  They said that they spoke to pt yesterday and offered her payment plan.  She will have to think on this and will call them back.   ?

## 2022-01-12 ENCOUNTER — Encounter: Payer: Self-pay | Admitting: Internal Medicine

## 2022-01-12 ENCOUNTER — Ambulatory Visit (INDEPENDENT_AMBULATORY_CARE_PROVIDER_SITE_OTHER): Payer: 59 | Admitting: Internal Medicine

## 2022-01-12 ENCOUNTER — Telehealth: Payer: Self-pay | Admitting: Family Medicine

## 2022-01-12 ENCOUNTER — Encounter (HOSPITAL_COMMUNITY): Payer: Self-pay | Admitting: Emergency Medicine

## 2022-01-12 ENCOUNTER — Emergency Department (HOSPITAL_COMMUNITY)
Admission: EM | Admit: 2022-01-12 | Discharge: 2022-01-12 | Disposition: A | Discharge status: Home / Self Care | Payer: 59 | Attending: Emergency Medicine | Admitting: Emergency Medicine

## 2022-01-12 ENCOUNTER — Other Ambulatory Visit: Payer: Self-pay

## 2022-01-12 VITALS — BP 132/78 | HR 88 | Temp 98.2°F | Wt 222.1 lb

## 2022-01-12 DIAGNOSIS — Z7982 Long term (current) use of aspirin: Secondary | ICD-10-CM | POA: Diagnosis not present

## 2022-01-12 DIAGNOSIS — E876 Hypokalemia: Secondary | ICD-10-CM | POA: Diagnosis not present

## 2022-01-12 DIAGNOSIS — N920 Excessive and frequent menstruation with regular cycle: Secondary | ICD-10-CM | POA: Diagnosis not present

## 2022-01-12 DIAGNOSIS — D500 Iron deficiency anemia secondary to blood loss (chronic): Secondary | ICD-10-CM | POA: Diagnosis not present

## 2022-01-12 DIAGNOSIS — R710 Precipitous drop in hematocrit: Secondary | ICD-10-CM | POA: Diagnosis not present

## 2022-01-12 DIAGNOSIS — R55 Syncope and collapse: Secondary | ICD-10-CM | POA: Insufficient documentation

## 2022-01-12 LAB — CBC WITH DIFFERENTIAL/PLATELET
Abs Immature Granulocytes: 0.01 K/uL (ref 0.00–0.07)
Basophils Absolute: 0 10*3/uL (ref 0.0–0.1)
Basophils Absolute: 0.1 K/uL (ref 0.0–0.1)
Basophils Relative: 0.7 % (ref 0.0–3.0)
Basophils Relative: 1 %
Eosinophils Absolute: 0.1 10*3/uL (ref 0.0–0.7)
Eosinophils Absolute: 0.1 K/uL (ref 0.0–0.5)
Eosinophils Relative: 3.2 % (ref 0.0–5.0)
Eosinophils Relative: 2 %
HCT: 27 % — ABNORMAL LOW (ref 36.0–46.0)
HCT: 27.9 % — ABNORMAL LOW (ref 36.0–46.0)
Hemoglobin: 8.4 g/dL — ABNORMAL LOW (ref 12.0–15.0)
Hemoglobin: 7.7 g/dL — ABNORMAL LOW (ref 12.0–15.0)
Immature Granulocytes: 0 %
Lymphocytes Relative: 33.4 % (ref 12.0–46.0)
Lymphocytes Relative: 33 %
Lymphs Abs: 1.3 10*3/uL (ref 0.7–4.0)
Lymphs Abs: 1.5 K/uL (ref 0.7–4.0)
MCH: 18.9 pg — ABNORMAL LOW (ref 26.0–34.0)
MCHC: 31.1 g/dL (ref 30.0–36.0)
MCHC: 27.6 g/dL — ABNORMAL LOW (ref 30.0–36.0)
MCV: 62 fl — ABNORMAL LOW (ref 78.0–100.0)
MCV: 68.6 fL — ABNORMAL LOW (ref 80.0–100.0)
Monocytes Absolute: 0.4 10*3/uL (ref 0.1–1.0)
Monocytes Absolute: 0.5 K/uL (ref 0.1–1.0)
Monocytes Relative: 10.7 % (ref 3.0–12.0)
Monocytes Relative: 10 %
Neutro Abs: 2 10*3/uL (ref 1.4–7.7)
Neutro Abs: 2.5 K/uL (ref 1.7–7.7)
Neutrophils Relative %: 52 % (ref 43.0–77.0)
Neutrophils Relative %: 54 %
Platelets: 308 10*3/uL (ref 150.0–400.0)
Platelets: 316 K/uL (ref 150–400)
RBC: 4.35 Mil/uL (ref 3.87–5.11)
RBC: 4.07 MIL/uL (ref 3.87–5.11)
RDW: 18.1 % — ABNORMAL HIGH (ref 11.5–15.5)
RDW: 17 % — ABNORMAL HIGH (ref 11.5–15.5)
WBC: 3.9 10*3/uL — ABNORMAL LOW (ref 4.0–10.5)
WBC: 4.6 K/uL (ref 4.0–10.5)
nRBC: 0 % (ref 0.0–0.2)

## 2022-01-12 LAB — BASIC METABOLIC PANEL
Anion gap: 6 (ref 5–15)
BUN: 9 mg/dL (ref 6–20)
CO2: 23 mmol/L (ref 22–32)
Calcium: 8.7 mg/dL — ABNORMAL LOW (ref 8.9–10.3)
Chloride: 110 mmol/L (ref 98–111)
Creatinine, Ser: 0.71 mg/dL (ref 0.44–1.00)
GFR, Estimated: >60 mL/min (ref >60)
Glucose, Bld: 102 mg/dL — ABNORMAL HIGH (ref 70–99)
Potassium: 3.4 mmol/L — ABNORMAL LOW (ref 3.5–5.1)
Sodium: 139 mmol/L (ref 135–145)

## 2022-01-12 LAB — I-STAT BETA HCG BLOOD, ED (MC, WL, AP ONLY): I-stat hCG, quantitative: <5 m[IU]/mL

## 2022-01-12 LAB — IBC PANEL
Iron: 19 ug/dL — ABNORMAL LOW (ref 42–145)
Saturation Ratios: 3.9 % — ABNORMAL LOW (ref 20.0–50.0)
TIBC: 484.4 ug/dL — ABNORMAL HIGH (ref 250.0–450.0)
Transferrin: 346 mg/dL (ref 212.0–360.0)

## 2022-01-12 LAB — FERRITIN: Ferritin: 2.6 ng/mL — ABNORMAL LOW (ref 10.0–291.0)

## 2022-01-12 LAB — RETICULOCYTES
Immature Retic Fract: 22.7 % — ABNORMAL HIGH (ref 2.3–15.9)
RBC.: 4.09 MIL/uL (ref 3.87–5.11)
Retic Count, Absolute: 58 10*3/uL (ref 19.0–186.0)
Retic Ct Pct: 1.4 % (ref 0.4–3.1)

## 2022-01-12 MED ORDER — SODIUM CHLORIDE 0.9 % IV BOLUS
1000.0000 mL | Freq: Once | INTRAVENOUS | Status: AC
  Administered 2022-01-12: 1000 mL via INTRAVENOUS

## 2022-01-12 MED ORDER — FERROUS SULFATE 325 (65 FE) MG PO TABS
325.0000 mg | ORAL_TABLET | Freq: Three times a day (TID) | ORAL | 0 refills | Status: DC
Start: 2022-01-12 — End: 2022-05-12

## 2022-01-12 MED ORDER — ACETAMINOPHEN 325 MG PO TABS
650.0000 mg | ORAL_TABLET | Freq: Once | ORAL | Status: AC
  Administered 2022-01-12: 650 mg via ORAL
  Filled 2022-01-12: qty 2

## 2022-01-12 NOTE — Telephone Encounter (Signed)
Pt is calling and would like blood work results and pt was seen today and feels like she is going to pass out

## 2022-01-12 NOTE — ED Notes (Signed)
Patient verbalizes understanding of discharge instructions. Opportunity for questioning and answers were provided. Armband removed by staff, pt discharged from ED ambulatory.   

## 2022-01-12 NOTE — Progress Notes (Signed)
Established Patient Office Visit     CC/Reason for Visit: Heavy bleeding with clots  HPI: Angela Hahn is a 46 y.o. female who is coming in today for the above mentioned reasons.  She started her cycle 2 days ago.  She had severe cramping and low back pain beyond her usual.  She expelled a very large vaginal clot.  She has pictures to share.  She usually has heavy menstrual cycles but this is more bleeding than she is accustomed to.  She is feeling fatigued, a little lightheaded but no shortness of breath or chest pain.  She has an appointment with her gynecologist scheduled for July.  Past Medical/Surgical History: Past Medical History:  Diagnosis Date   Anemia    Chronic headaches    Dysmenorrhea    Heart murmur    dx'd since childhood   Stroke [facility])     Past Surgical History:  Procedure Laterality Date   BUBBLE STUDY  07/14/2021   Procedure: BUBBLE STUDY;  Surgeon: Skeet Latch, MD;  Location: [place];  Service: Cardiovascular;;   TEE WITHOUT CARDIOVERSION N/A 07/14/2021   Procedure: TRANSESOPHAGEAL ECHOCARDIOGRAM (TEE);  Surgeon: Skeet Latch, MD;  Location: [place];  Service: Cardiovascular;  Laterality: N/A;   TUBAL LIGATION      Social History:  reports that she quit smoking about 7 years ago. Her smoking use included cigarettes. She has never used smokeless tobacco. She reports that she does not drink alcohol and does not use drugs.  Allergies: Allergies  Allergen Reactions   Gadolinium Derivatives Itching, Swelling and Cough   Hydrocodone Other (See Comments)    Itching, takes Benadryl with this medicine   Tramadol Other (See Comments)    headache    Family History:  Family History  Problem Relation Age of Onset   Arthritis Mother    Depression Mother    Hypertension Mother    Diabetes Mother    Hypertension Father    Diabetes Father      Current Outpatient Medications:    acetaminophen (TYLENOL) 500 MG tablet, Take 1,000 mg  by mouth every 6 (six) hours as needed for mild pain or headache., Disp: , Rfl:    aspirin EC 81 MG tablet, Take 81 mg by mouth daily. Swallow whole., Disp: , Rfl:    atorvastatin (LIPITOR) 40 MG tablet, TAKE ONE TABLET BY MOUTH DAILY, Disp: 30 tablet, Rfl: 2   LORazepam (ATIVAN) 1 MG tablet, Take 1 tablet (1 mg total) by mouth every 8 (eight) hours as needed for anxiety., Disp: 90 tablet, Rfl: 2   triamcinolone cream (KENALOG) 0.1 %, Apply 1 application topically 2 (two) times daily., Disp: 15 g, Rfl: 1   diltiazem (CARDIZEM CD) 180 MG 24 hr capsule, Take 1 capsule (180 mg total) by mouth daily., Disp: 90 capsule, Rfl: 3  Review of Systems:  Constitutional: Denies fever, chills, diaphoresis, appetite change. HEENT: Denies photophobia, eye pain, redness, hearing loss, ear pain, congestion, sore throat, rhinorrhea, sneezing, mouth sores, trouble swallowing, neck pain, neck stiffness and tinnitus.   Respiratory: Denies SOB, DOE, cough, chest tightness,  and wheezing.   Cardiovascular: Denies chest pain, palpitations and leg swelling.  Gastrointestinal: Denies nausea, vomiting,  diarrhea, constipation, blood in stool and abdominal distention.  Genitourinary: Denies dysuria, urgency, frequency, hematuria, flank pain and difficulty urinating.  Endocrine: Denies: hot or cold intolerance, sweats, changes in hair or nails, polyuria, polydipsia. Musculoskeletal: Denies myalgias, back pain, joint swelling, arthralgias and gait problem.  Skin: Denies pallor, rash and wound.  Neurological: Denies dizziness, seizures, syncope, weakness, light-headedness, numbness and headaches.  Hematological: Denies adenopathy. Easy bruising, personal or family bleeding history  Psychiatric/Behavioral: Denies suicidal ideation, mood changes, confusion, nervousness, sleep disturbance and agitation    Physical Exam: Vitals:   01/12/22 0808 01/12/22 0812  BP: 130/80 132/78  Pulse: 88   Temp: 98.2 F (36.8 C)    TempSrc: Oral   SpO2: 100%   Weight: 222 lb 1.6 oz (100.7 kg)     Body mass index is 35.85 kg/m.   Constitutional: NAD, calm, comfortable Eyes: PERRL, lids and conjunctivae normal ENMT: Mucous membranes are moist.  Cardiovascular: Regular rate and rhythm, no murmurs / rubs / gallops. No extremity edema. Psychiatric: Normal judgment and insight. Alert and oriented x 3. Normal mood.    Impression and Plan:  Menorrhagia with regular cycle  - Plan: CBC with Differential/Platelet, IBC panel, Ferritin -I will check iron levels and CBC, advise she follow-up with GYN as scheduled.    Time spent:30 minutes reviewing chart, interviewing and examining patient and formulating plan of care.      Lelon Frohlich, MD [facility]

## 2022-01-12 NOTE — ED Notes (Signed)
Lab to add on Reticulocytes

## 2022-01-12 NOTE — Telephone Encounter (Signed)
Spoke with patient and reviewed lab results. See lab result note. 

## 2022-01-12 NOTE — ED Triage Notes (Signed)
Pt BIB EMS due to a syncopal event that lasted 2-3 min. VSS. Axox4. Pt states her iron is super low at 20.

## 2022-01-12 NOTE — Telephone Encounter (Signed)
Pt calling to see if infusion was set up, as mentioned in her appointment this morning. States she is very weak, light headed

## 2022-01-12 NOTE — ED Provider Notes (Signed)
[place] EMERGENCY DEPARTMENT Provider Note   CSN: [id number] Arrival date & time: 01/12/22  1704     History  Chief Complaint  Patient presents with   Loss of Consciousness    Angela Hahn is a 46 y.o. female.  Patient presents to the hospital via EMS after a syncopal episode that reportedly lasted approximately 2 to 3 minutes.  Patient states that she was seen at primary care earlier this morning due to having heavier than usual menstrual cycles.  Patient reportedly passed large blood clots over the past day or 2.  Patient states that her iron was reported to be very low and that they were working on scheduling her an iron infusion.  She went home and laid down and was awakened by EMS.  Estimated syncopal time of approximately 2 to 3 minutes.  The patient currently states she feels a little tired but does not complain of shortness of breath, weakness, chest pain, or abdominal pain.  Does endorse a heavier than usual menstrual cycle.  Past medical history significant for dysmenorrhea, anemia, heart murmur, history of stroke  HPI     Home Medications Prior to Admission medications   Medication Sig Start Date End Date Taking? Authorizing Provider  acetaminophen (TYLENOL) 500 MG tablet Take 1,000 mg by mouth every 6 (six) hours as needed for mild pain or headache.   Yes [provider]  aspirin EC 81 MG tablet Take 81 mg by mouth daily. Swallow whole.   Yes [provider]  atorvastatin (LIPITOR) 40 MG tablet TAKE ONE TABLET BY MOUTH DAILY 10/20/21  Yes Laurey Morale, MD  diltiazem (CARDIZEM CD) 180 MG 24 hr capsule Take 1 capsule (180 mg total) by mouth daily. 10/04/21 01/12/22 Yes Tobb, Kardie, DO  LORazepam (ATIVAN) 1 MG tablet Take 1 tablet (1 mg total) by mouth every 8 (eight) hours as needed for anxiety. 10/03/21  Yes Laurey Morale, MD  triamcinolone cream (KENALOG) 0.1 % Apply 1 application topically 2 (two) times daily. Patient taking  differently: Apply 1 application. topically daily as needed (for Eczema per patient). 10/06/21  Yes Laurey Morale, MD  Cetirizine HCl 10 MG CAPS Take 1 capsule (10 mg total) by mouth daily. 08/28/19 12/17/20  Wieters, Hallie C, PA-C      Allergies    Gadolinium derivatives, Hydrocodone, and Tramadol    Review of Systems   Review of Systems  Constitutional:  Positive for fatigue.  Respiratory:  Negative for shortness of breath.   Cardiovascular:  Negative for chest pain.  Gastrointestinal:  Negative for abdominal pain.  Genitourinary:  Positive for vaginal bleeding.  Neurological:  Positive for syncope.   Physical Exam Updated Vital Signs BP 105/76   Pulse 72   Temp 98.8 F (37.1 C) (Oral)   Resp 14   LMP 12/25/2021 (Approximate)   SpO2 100%  Physical Exam Vitals and nursing note reviewed.  Constitutional:      General: She is not in acute distress. HENT:     Head: Normocephalic and atraumatic.  Eyes:     Conjunctiva/sclera: Conjunctivae normal.  Cardiovascular:     Rate and Rhythm: Normal rate and regular rhythm.     Pulses: Normal pulses.  Pulmonary:     Effort: Pulmonary effort is normal.     Breath sounds: Normal breath sounds.  Abdominal:     Palpations: Abdomen is soft.     Tenderness: There is no abdominal tenderness.  Musculoskeletal:  Cervical back: Normal range of motion.  Skin:    General: Skin is warm and dry.     Capillary Refill: Capillary refill takes less than 2 seconds.  Neurological:     Mental Status: She is alert.     Sensory: Sensation is intact.     Motor: Motor function is intact.     Coordination: Coordination is intact.     Comments: Cranial nerves II through VII, XI, XII intact    ED Results / Procedures / Treatments   Labs (all labs ordered are listed, but only abnormal results are displayed) Labs Reviewed  BASIC METABOLIC PANEL - Abnormal; Notable for the following components:      Result Value   Potassium 3.4 (*)    Glucose,  Bld 102 (*)    Calcium 8.7 (*)    All other components within normal limits  CBC WITH DIFFERENTIAL/PLATELET - Abnormal; Notable for the following components:   Hemoglobin 7.7 (*)    HCT 27.9 (*)    MCV 68.6 (*)    MCH 18.9 (*)    MCHC 27.6 (*)    RDW 17.0 (*)    All other components within normal limits  RETICULOCYTES  I-STAT BETA HCG BLOOD, ED ([facility], [facility], [facility] ONLY)    EKG None  Radiology No results found.  Procedures Procedures    Medications Ordered in ED Medications  acetaminophen (TYLENOL) tablet 650 mg (has no administration in time range)  sodium chloride 0.9 % bolus 1,000 mL (1,000 mLs Intravenous New Bag/Given 01/12/22 1817)    ED Course/ Medical Decision Making/ A&P                           Medical Decision Making Amount and/or Complexity of Data Reviewed Labs: ordered.  Risk OTC drugs.   Patient presents with chief concern of syncopal episode.  Differential includes but is not limited to acute blood loss anemia, iron deficiency anemia, dysrhythmia, vasovagal syncope, and others  I ordered and interpreted labs.  Pertinent results include potassium 3.4.  CBC shows hemoglobin of 7.7 with recommendation to check reticulocytes.  Reticulocytes ordered and pending.  I-STAT beta-hCG of 3  EKG shows underlying rhythm of sinus rhythm.  Patient's reticulocytes pending at this time.  Patient care being transferred to Dr. Roderic Palau at shift change.  Discharge/admission pending test results and patient response to fluid    Final Clinical Impression(s) / ED Diagnoses Final diagnoses:  Syncope, unspecified syncope type    Rx / DC Orders ED Discharge Orders     None         Ronny Bacon 01/12/22 1913    Milton Ferguson, MD 01/16/22 [phone]

## 2022-01-12 NOTE — Discharge Instructions (Signed)
Continue to try to set up your iron infusion.  Contact your doctor for follow-up

## 2022-01-17 ENCOUNTER — Telehealth: Payer: Self-pay

## 2022-01-17 NOTE — Telephone Encounter (Signed)
--  Caller states that she has been feeling weak, and dizzy, also c/o of piercing headache. states she needs infusion for hgb of 7.7 was seen in office and went to the hospital on Friday. States headache is 9.5/ 10 has not taken any pain medication  01/16/2022 5:14:34 PM Clinical Call Dema Severin, RN, Merleen Nicely  Comments User: Lavona Mound, RN Date/Time Eilene Ghazi Time): 01/16/2022 5:14:19 PM Caller disconnected before I could attempt to call office back line also declined triage.  Pt has appt for infusion on 01/19/22 at [facility]  01/17/22 0948 - LVM for pt to call office if needed.

## 2022-01-18 ENCOUNTER — Other Ambulatory Visit: Payer: Self-pay | Admitting: Family Medicine

## 2022-01-18 DIAGNOSIS — E785 Hyperlipidemia, unspecified: Secondary | ICD-10-CM

## 2022-01-19 ENCOUNTER — Non-Acute Institutional Stay (HOSPITAL_COMMUNITY)
Admission: RE | Admit: 2022-01-19 | Discharge: 2022-01-19 | Disposition: A | Discharge status: Home / Self Care | Payer: 59 | Source: Ambulatory Visit | Attending: Internal Medicine | Admitting: Internal Medicine

## 2022-01-19 DIAGNOSIS — D649 Anemia, unspecified: Secondary | ICD-10-CM | POA: Diagnosis not present

## 2022-01-19 MED ORDER — SODIUM CHLORIDE 0.9 % IV SOLN
510.0000 mg | Freq: Once | INTRAVENOUS | Status: AC
  Administered 2022-01-19: 510 mg via INTRAVENOUS
  Filled 2022-01-19: qty 17

## 2022-01-19 MED ORDER — SODIUM CHLORIDE 0.9 % IV SOLN: INTRAVENOUS | Status: DC | PRN

## 2022-01-19 NOTE — Progress Notes (Signed)
PATIENT CARE CENTER NOTE   Diagnosis: Low ferritin, mild anemia (D64.9)   Provider: Domingo Mend, MD   Procedure: Feraheme infusion    Note:  Patient received Feraheme infusion (dose 1 of 1) via PIV. Patient tolerated infusion well but reported some mild nausea at completion of infusion. Patient given a soda and ate a small snack. Observed patient for 30 minutes post infusion. Vital signs stable. Discharge instructions given to patient. Patient alert, oriented and ambulatory at discharge. Discharged home with mother.

## 2022-01-26 ENCOUNTER — Encounter: Payer: Self-pay | Admitting: Family Medicine

## 2022-01-26 ENCOUNTER — Ambulatory Visit (INDEPENDENT_AMBULATORY_CARE_PROVIDER_SITE_OTHER): Payer: 59 | Admitting: Family Medicine

## 2022-01-26 ENCOUNTER — Encounter: Payer: Self-pay | Admitting: Internal Medicine

## 2022-01-26 DIAGNOSIS — D509 Iron deficiency anemia, unspecified: Secondary | ICD-10-CM | POA: Insufficient documentation

## 2022-01-26 DIAGNOSIS — D500 Iron deficiency anemia secondary to blood loss (chronic): Secondary | ICD-10-CM

## 2022-01-26 MED ORDER — SCOPOLAMINE 1 MG/3DAYS TD PT72: 1.0000 | MEDICATED_PATCH | TRANSDERMAL | 0 refills | Status: DC

## 2022-01-26 NOTE — Progress Notes (Addendum)
   Subjective:    Patient ID: Angela Hahn, female    DOB: [birth year], 46 y.o.   MRN: [id number]  HPI Here to follow up on anemia from chronic blood loss. She has had heavy and long menses for years, and her Hgb has usually been in the range of 10.5 to 11.5. Then about 4 months ago it was down to 9.4. She has been taking iron pills TID, but she still felt weak all the time. Then she had a menses that was much longer and much heavier than usual, and she saw [place] on the morning of 01-12-22. Her Hgb at that time was 8.4. Then she continued to feel weaker throughout the day, and that evening she passed out for 2-3 minutes. She was taken to the ED where her Hgb was found to be 7.7. She was given IV fluids and she stabilized, so she was sent home with a plan of getting an iron infusion. On 01-19-22 did she receive an infusion of Ferraheme, and she tolerated this well other than having some mild muscle aches. We had referred her to see GYN, and she is scheduled to see Dr. Aletha Halim on 03-07-22. Today she is still a little weak, but she feels much better overall.    Review of Systems  Constitutional:  Positive for fatigue.  Respiratory: Negative.    Cardiovascular: Negative.   Neurological: Negative.       Objective:   Physical Exam Constitutional:      General: She is not in acute distress.    Appearance: Normal appearance.  Cardiovascular:     Rate and Rhythm: Normal rate and regular rhythm.     Pulses: Normal pulses.     Heart sounds: Normal heart sounds.  Pulmonary:     Effort: Pulmonary effort is normal.     Breath sounds: Normal breath sounds.  Neurological:     General: No focal deficit present.     Mental Status: She is alert and oriented to person, place, and time.          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 We will check another CBC today. She will continue to take OTC iron pills TID. She will see GYN on 03-07-22. Until then  she will follow up with [place] if anything changes. We spent a total of ( 32  ) minutes reviewing records and discussing these issues.  Alysia Penna, MD  Alysia Penna, MD

## 2022-01-26 NOTE — Progress Notes (Addendum)
See the Addendum Note for today Established Patient Office Visit         CC/Reason for Visit: Heavy bleeding with clots   HPI: Angela Hahn is a 46 y.o. female who is coming in today for the above mentioned reasons.  She started her cycle 2 days ago.  She had severe cramping and low back pain beyond her usual.  She expelled a very large vaginal clot.  She has pictures to share.  She usually has heavy menstrual cycles but this is more bleeding than she is accustomed to.  She is feeling fatigued, a little lightheaded but no shortness of breath or chest pain.  She has an appointment with her gynecologist scheduled for July.   Past Medical/Surgical History:     Past Medical History:  Diagnosis Date   Anemia     Chronic headaches     Dysmenorrhea     Heart murmur      dx'd since childhood   Stroke [facility])             Past Surgical History:  Procedure Laterality Date   BUBBLE STUDY   07/14/2021    Procedure: BUBBLE STUDY;  Surgeon: Skeet Latch, MD;  Location: [place];  Service: Cardiovascular;;   TEE WITHOUT CARDIOVERSION N/A 07/14/2021    Procedure: TRANSESOPHAGEAL ECHOCARDIOGRAM (TEE);  Surgeon: Skeet Latch, MD;  Location: [place];  Service: Cardiovascular;  Laterality: N/A;   TUBAL LIGATION          Social History:  reports that she quit smoking about 7 years ago. Her smoking use included cigarettes. She has never used smokeless tobacco. She reports that she does not drink alcohol and does not use drugs.   Allergies:      Allergies  Allergen Reactions   Gadolinium Derivatives Itching, Swelling and Cough   Hydrocodone Other (See Comments)      Itching, takes Benadryl with this medicine   Tramadol Other (See Comments)      headache      Family History:       Family History  Problem Relation Age of Onset   Arthritis Mother     Depression Mother     Hypertension Mother     Diabetes Mother     Hypertension Father     Diabetes Father           Current Outpatient Medications:    acetaminophen (TYLENOL) 500 MG tablet, Take 1,000 mg by mouth every 6 (six) hours as needed for mild pain or headache., Disp: , Rfl:    aspirin EC 81 MG tablet, Take 81 mg by mouth daily. Swallow whole., Disp: , Rfl:    atorvastatin (LIPITOR) 40 MG tablet, TAKE ONE TABLET BY MOUTH DAILY, Disp: 30 tablet, Rfl: 2   LORazepam (ATIVAN) 1 MG tablet, Take 1 tablet (1 mg total) by mouth every 8 (eight) hours as needed for anxiety., Disp: 90 tablet, Rfl: 2   triamcinolone cream (KENALOG) 0.1 %, Apply 1 application topically 2 (two) times daily., Disp: 15 g, Rfl: 1   diltiazem (CARDIZEM CD) 180 MG 24 hr capsule, Take 1 capsule (180 mg total) by mouth daily., Disp: 90 capsule, Rfl: 3   Review of Systems:  Constitutional: Denies fever, chills, diaphoresis, appetite change. HEENT: Denies photophobia, eye pain, redness, hearing loss, ear pain, congestion, sore throat, rhinorrhea, sneezing, mouth sores, trouble swallowing, neck pain, neck stiffness and tinnitus.   Respiratory: Denies SOB, DOE, cough, chest tightness,  and wheezing.   Cardiovascular:  Denies chest pain, palpitations and leg swelling.  Gastrointestinal: Denies nausea, vomiting,  diarrhea, constipation, blood in stool and abdominal distention.  Genitourinary: Denies dysuria, urgency, frequency, hematuria, flank pain and difficulty urinating.  Endocrine: Denies: hot or cold intolerance, sweats, changes in hair or nails, polyuria, polydipsia. Musculoskeletal: Denies myalgias, back pain, joint swelling, arthralgias and gait problem.  Skin: Denies pallor, rash and wound.  Neurological: Denies dizziness, seizures, syncope, weakness, light-headedness, numbness and headaches.  Hematological: Denies adenopathy. Easy bruising, personal or family bleeding history  Psychiatric/Behavioral: Denies suicidal ideation, mood changes, confusion, nervousness, sleep disturbance and agitation       Physical Exam:     Vitals:     01/12/22 0808 01/12/22 0812  BP: 130/80 132/78  Pulse: 88    Temp: 98.2 F (36.8 C)    TempSrc: Oral    SpO2: 100%    Weight: 222 lb 1.6 oz (100.7 kg)        Body mass index is 35.85 kg/m.     Constitutional: NAD, calm, comfortable Eyes: PERRL, lids and conjunctivae normal ENMT: Mucous membranes are moist.  Cardiovascular: Regular rate and rhythm, no murmurs / rubs / gallops. No extremity edema. Psychiatric: Normal judgment and insight. Alert and oriented x 3. Normal mood.      Impression and Plan:   Menorrhagia with regular cycle  - Plan: CBC with Differential/Platelet, IBC panel, Ferritin -I will check iron levels and CBC, advise she follow-up with GYN as scheduled.       Time spent:30 minutes reviewing chart, interviewing and examining patient and formulating plan of care.           Lelon Frohlich, MD [facility]         Orthostatic Vitals Recorded in This Encounter   01/12/2022  0808 01/12/2022  7096    Patient Position: Sitting --    BP Location: Right Arm Left Leg    Cuff Size: Normal Normal

## 2022-01-27 ENCOUNTER — Other Ambulatory Visit: Payer: Self-pay

## 2022-01-27 DIAGNOSIS — D500 Iron deficiency anemia secondary to blood loss (chronic): Secondary | ICD-10-CM

## 2022-01-27 LAB — CBC WITH DIFFERENTIAL/PLATELET
Basophils Absolute: 0.1 10*3/uL (ref 0.0–0.1)
Basophils Relative: 1 % (ref 0.0–3.0)
Eosinophils Absolute: 0.2 10*3/uL (ref 0.0–0.7)
Eosinophils Relative: 3.3 % (ref 0.0–5.0)
HCT: 31.9 % — ABNORMAL LOW (ref 36.0–46.0)
Hemoglobin: 9.8 g/dL — ABNORMAL LOW (ref 12.0–15.0)
Lymphocytes Relative: 26.1 % (ref 12.0–46.0)
Lymphs Abs: 1.4 10*3/uL (ref 0.7–4.0)
MCHC: 30.6 g/dL (ref 30.0–36.0)
MCV: 69.5 fl — ABNORMAL LOW (ref 78.0–100.0)
Monocytes Absolute: 0.6 10*3/uL (ref 0.1–1.0)
Monocytes Relative: 10.5 % (ref 3.0–12.0)
Neutro Abs: 3.2 10*3/uL (ref 1.4–7.7)
Neutrophils Relative %: 59.1 % (ref 43.0–77.0)
Platelets: 253 10*3/uL (ref 150.0–400.0)
RBC: 4.6 Mil/uL (ref 3.87–5.11)
RDW: 19.5 % — ABNORMAL HIGH (ref 11.5–15.5)
WBC: 5.4 10*3/uL (ref 4.0–10.5)

## 2022-01-31 ENCOUNTER — Telehealth: Payer: Self-pay | Admitting: Family Medicine

## 2022-01-31 NOTE — Telephone Encounter (Signed)
Patient dropped off paperwork on 01/26/22 and is checking on progress.

## 2022-01-31 NOTE — Telephone Encounter (Signed)
Pt form placed in Dr Sarajane Jews red folder awaiting competing, pt will be notified when the form is ready  for pick up

## 2022-02-01 ENCOUNTER — Emergency Department (HOSPITAL_BASED_OUTPATIENT_CLINIC_OR_DEPARTMENT_OTHER)
Admission: EM | Admit: 2022-02-01 | Discharge: 2022-02-01 | Disposition: A | Discharge status: Home / Self Care | Payer: 59 | Attending: Emergency Medicine | Admitting: Emergency Medicine

## 2022-02-01 ENCOUNTER — Other Ambulatory Visit: Payer: Self-pay

## 2022-02-01 ENCOUNTER — Encounter (HOSPITAL_BASED_OUTPATIENT_CLINIC_OR_DEPARTMENT_OTHER): Payer: Self-pay

## 2022-02-01 ENCOUNTER — Emergency Department (HOSPITAL_BASED_OUTPATIENT_CLINIC_OR_DEPARTMENT_OTHER): Payer: 59

## 2022-02-01 DIAGNOSIS — R42 Dizziness and giddiness: Secondary | ICD-10-CM | POA: Insufficient documentation

## 2022-02-01 DIAGNOSIS — R519 Headache, unspecified: Secondary | ICD-10-CM | POA: Insufficient documentation

## 2022-02-01 DIAGNOSIS — Z7982 Long term (current) use of aspirin: Secondary | ICD-10-CM | POA: Insufficient documentation

## 2022-02-01 DIAGNOSIS — Z79899 Other long term (current) drug therapy: Secondary | ICD-10-CM | POA: Insufficient documentation

## 2022-02-01 LAB — URINALYSIS, ROUTINE W REFLEX MICROSCOPIC
Bilirubin Urine: NEGATIVE
Glucose, UA: NEGATIVE mg/dL
Hgb urine dipstick: NEGATIVE
Ketones, ur: NEGATIVE mg/dL
Leukocytes,Ua: NEGATIVE
Nitrite: NEGATIVE
Specific Gravity, Urine: 1.023 (ref 1.005–1.030)
pH: 6.5 (ref 5.0–8.0)

## 2022-02-01 LAB — CBC
HCT: 34.2 % — ABNORMAL LOW (ref 36.0–46.0)
Hemoglobin: 9.8 g/dL — ABNORMAL LOW (ref 12.0–15.0)
MCH: 21.4 pg — ABNORMAL LOW (ref 26.0–34.0)
MCHC: 28.7 g/dL — ABNORMAL LOW (ref 30.0–36.0)
MCV: 74.5 fL — ABNORMAL LOW (ref 80.0–100.0)
Platelets: 189 10*3/uL (ref 150–400)
RBC: 4.59 MIL/uL (ref 3.87–5.11)
RDW: 27.3 % — ABNORMAL HIGH (ref 11.5–15.5)
WBC: 3.8 10*3/uL — ABNORMAL LOW (ref 4.0–10.5)
nRBC: 0 % (ref 0.0–0.2)

## 2022-02-01 LAB — BASIC METABOLIC PANEL
Anion gap: 8 (ref 5–15)
BUN: 10 mg/dL (ref 6–20)
CO2: 23 mmol/L (ref 22–32)
Calcium: 9.9 mg/dL (ref 8.9–10.3)
Chloride: 108 mmol/L (ref 98–111)
Creatinine, Ser: 0.65 mg/dL (ref 0.44–1.00)
GFR, Estimated: >60 mL/min (ref >60)
Glucose, Bld: 105 mg/dL — ABNORMAL HIGH (ref 70–99)
Potassium: 3.4 mmol/L — ABNORMAL LOW (ref 3.5–5.1)
Sodium: 139 mmol/L (ref 135–145)

## 2022-02-01 LAB — PROTIME-INR
INR: 1.1 (ref 0.8–1.2)
Prothrombin Time: 13.8 seconds (ref 11.4–15.2)

## 2022-02-01 LAB — CBG MONITORING, ED: Glucose-Capillary: 129 mg/dL — ABNORMAL HIGH (ref 70–99)

## 2022-02-01 LAB — PREGNANCY, URINE: Preg Test, Ur: NEGATIVE

## 2022-02-01 IMAGING — CT CT HEAD W/O CM
4 of 6 series · 17 of 47 positions shown, 19 images · non-contrast
Comparison: [DATE] and previous

CLINICAL DATA: Dizziness, non-specific



[Series 2: head wo · axial · 0.43mm/px · z∈[+1064,+1184]mm · 7 of 33 slices shown, 9 images]
[im 5/33  brain]
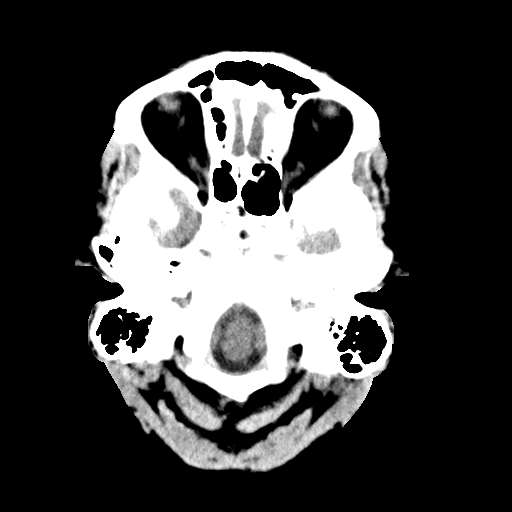
[im 5/33  bone]
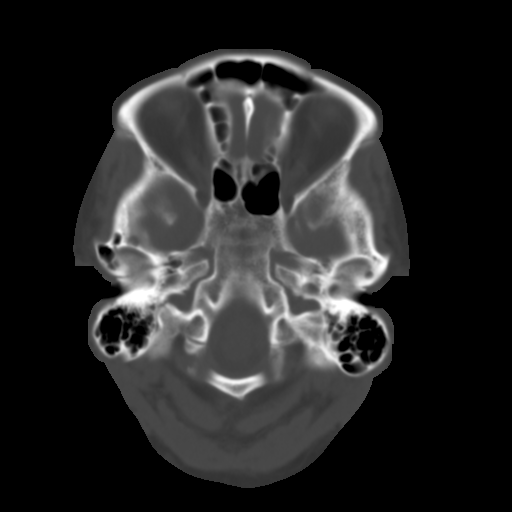
[im 9/33  brain]
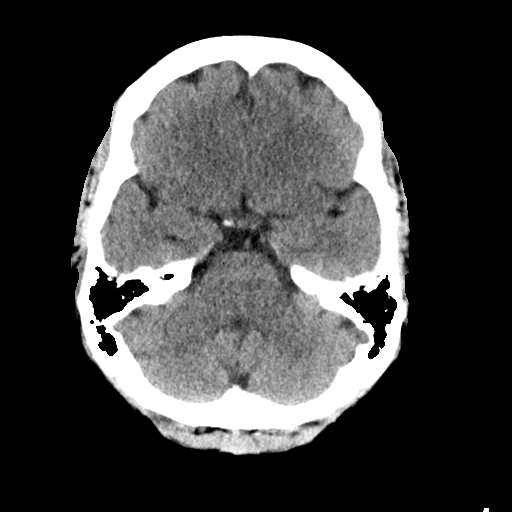
[im 13/33  brain]
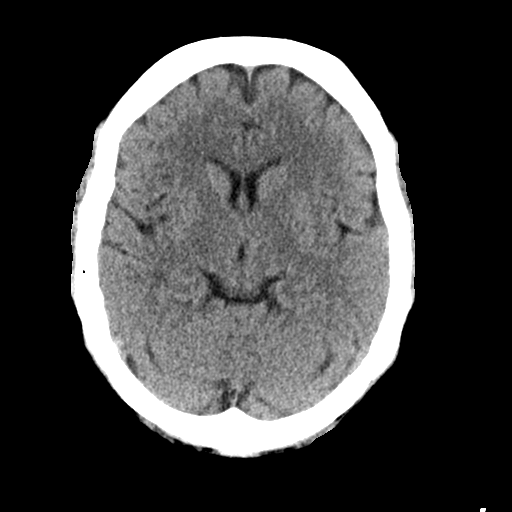
[im 17/33  brain]
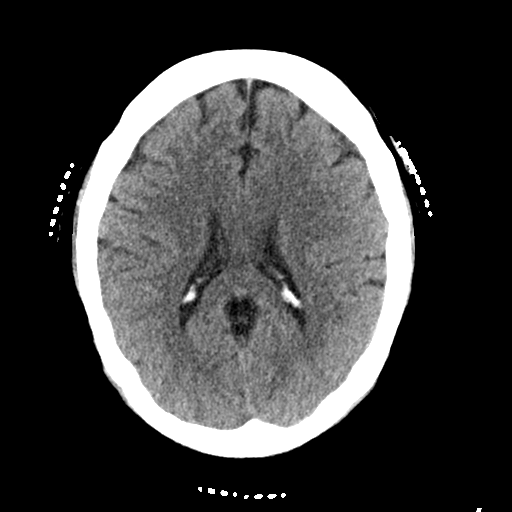
[im 21/33  brain]
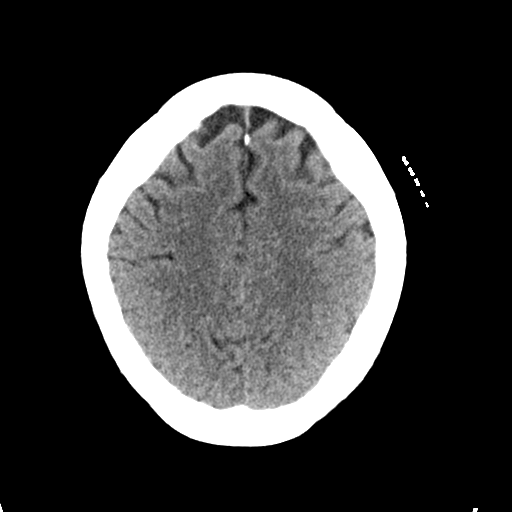
[im 21/33  bone]
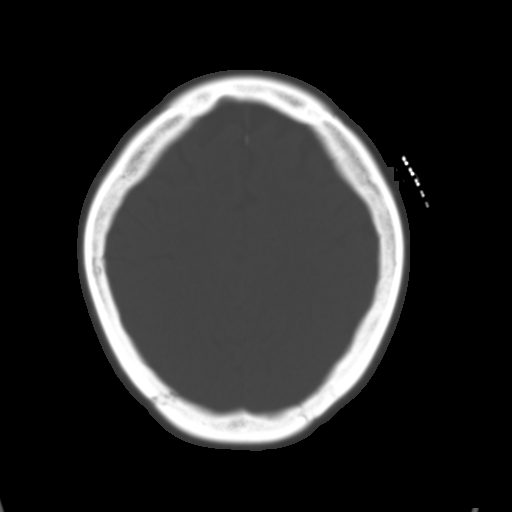
[im 25/33  brain]
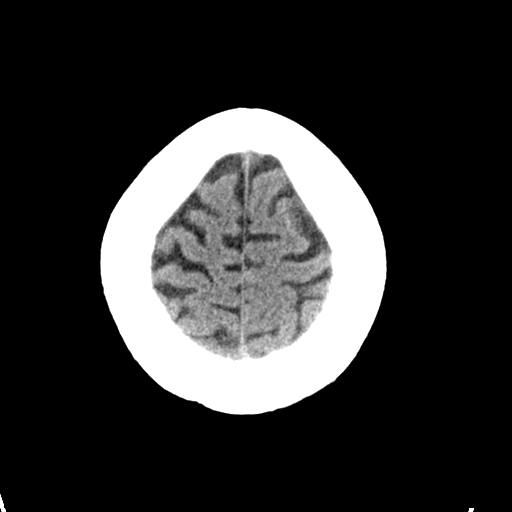
[im 29/33  brain]
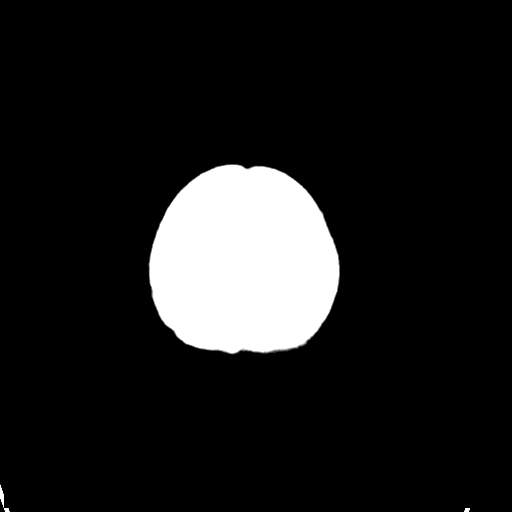

[Series 5: head bone · axial · 0.43mm/px · z∈[+1060,+1116]mm · 4 of 83 slices shown]
[im 9/83  bone]
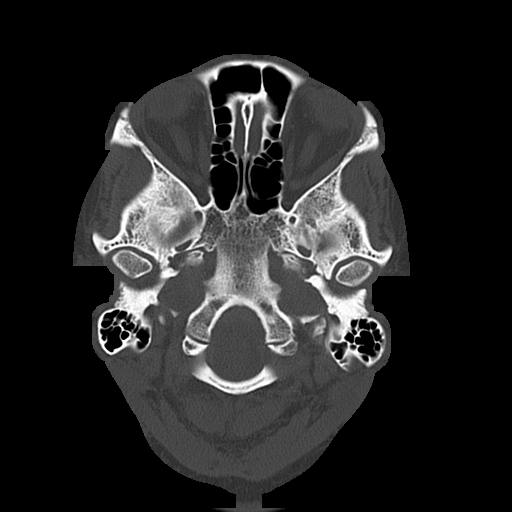
[im 17/83  bone]
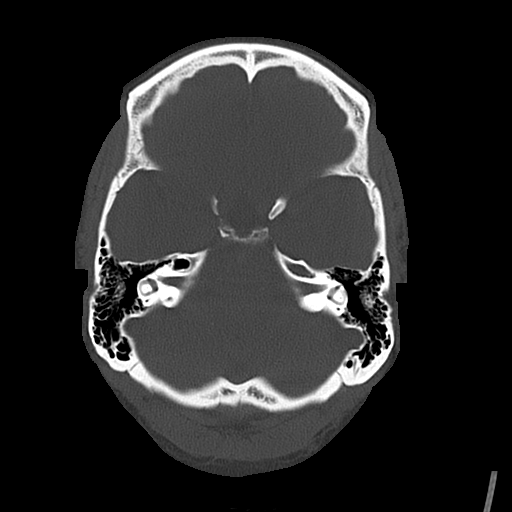
[im 25/83  bone]
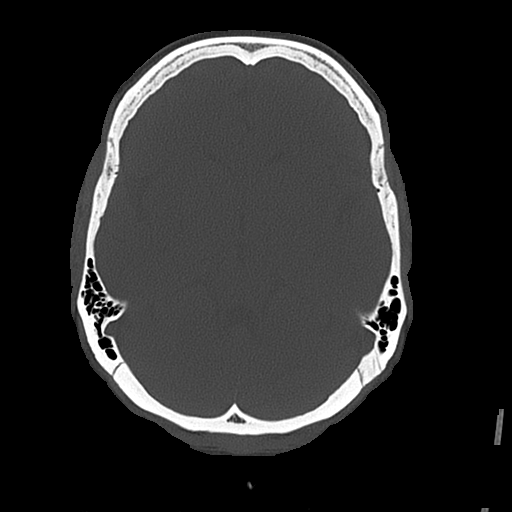
[im 37/83  bone]
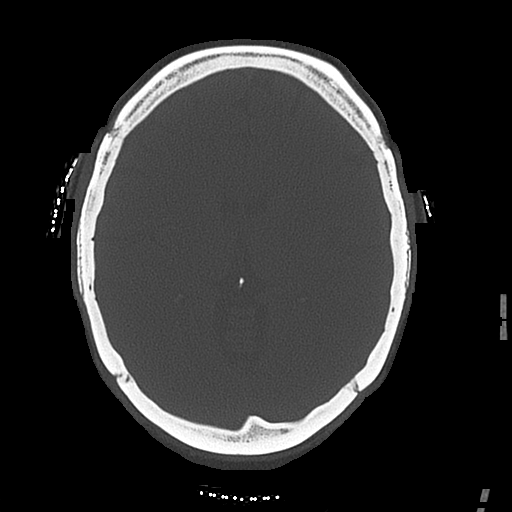

[Series 6: coronal soft · coronal · 0.34mm/px · 3 of 67 slices shown]
[im 19/67  brain]
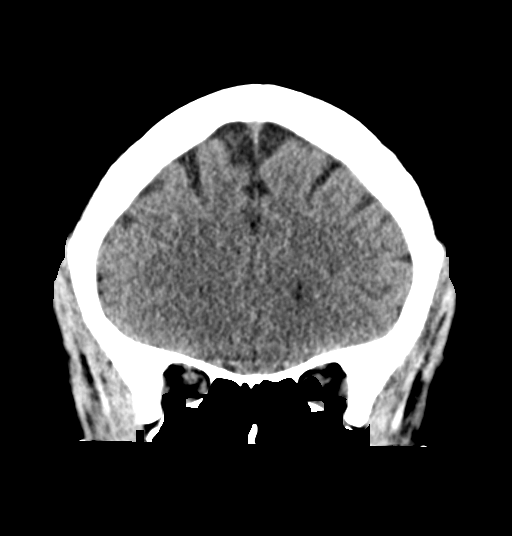
[im 29/67  brain]
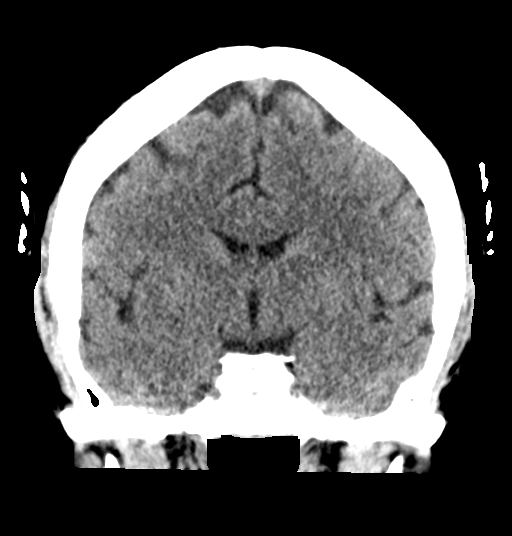
[im 38/67  brain]
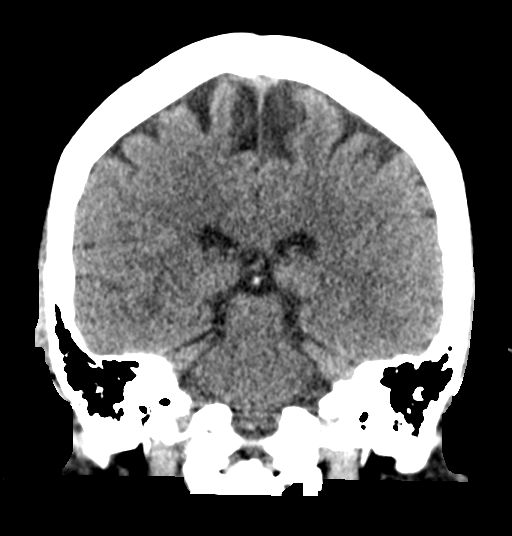

[Series 7: sagittal soft · sagittal · 0.35mm/px · 3 of 58 slices shown]
[im 20/58  brain]
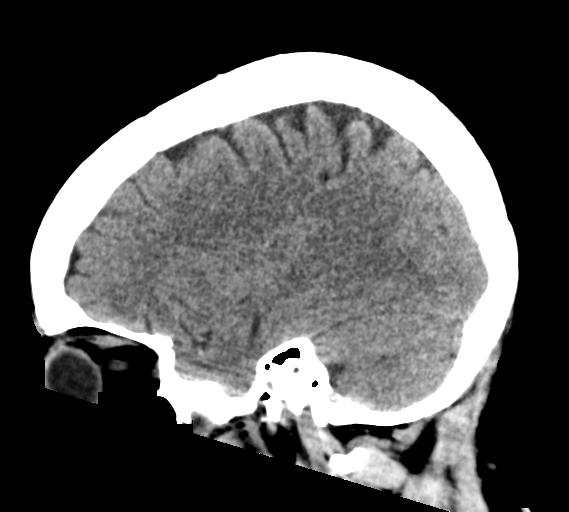
[im 29/58  brain]
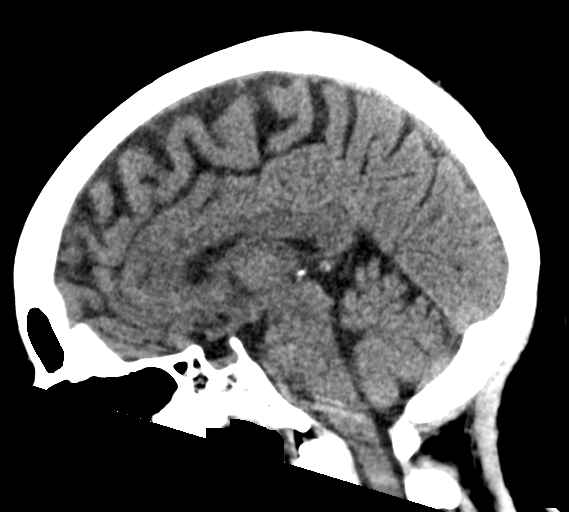
[im 39/58  brain]
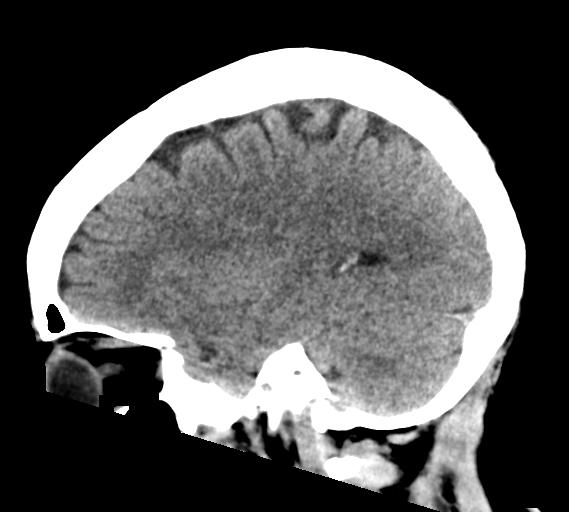

[17 of 47 positions shown; findings below may reference images not displayed]

FINDINGS: Brain: No evidence of acute infarction, hemorrhage, hydrocephalus,
extra-axial collection or mass lesion/mass effect.

Vascular: No hyperdense vessel or unexpected calcification.

Skull: Normal. Negative for fracture or focal lesion.

Sinuses/Orbits: No acute finding.

Other: None
IMPRESSION: Negative

## 2022-02-01 MED ORDER — METOCLOPRAMIDE HCL 5 MG/ML IJ SOLN
5.0000 mg | Freq: Once | INTRAMUSCULAR | Status: AC
  Administered 2022-02-01: 5 mg via INTRAVENOUS
  Filled 2022-02-01: qty 2

## 2022-02-01 MED ORDER — KETOROLAC TROMETHAMINE 30 MG/ML IJ SOLN
15.0000 mg | Freq: Once | INTRAMUSCULAR | Status: AC
  Administered 2022-02-01: 15 mg via INTRAVENOUS

## 2022-02-01 MED ORDER — DIPHENHYDRAMINE HCL 50 MG/ML IJ SOLN
12.5000 mg | Freq: Once | INTRAMUSCULAR | Status: AC
  Administered 2022-02-01: 12.5 mg via INTRAVENOUS
  Filled 2022-02-01: qty 1

## 2022-02-01 MED ORDER — KETOROLAC TROMETHAMINE 30 MG/ML IJ SOLN
15.0000 mg | Freq: Once | INTRAMUSCULAR | Status: DC
Start: 2022-02-01 — End: 2022-02-01
  Filled 2022-02-01: qty 1

## 2022-02-01 NOTE — ED Notes (Signed)
Pt resting normal breathing pattern rise and fall of chest

## 2022-02-01 NOTE — ED Notes (Signed)
Pt currently unable to provide urine sample. Provided pt water, Dr.Allen agreed. Instructed pt to press call button when she feels she is able to go.

## 2022-02-01 NOTE — ED Provider Notes (Signed)
Patient's head CT labs without any significant abnormalities.  Patient's hemoglobin is stable as where its been from June 1 with a hemoglobin of 9.8.  Patient feels much better with the treatments.  She feels fine wants to go home which is appropriate.  She has follow-up with her doctor next week.  Patient will return for any new or worse symptoms.   Fredia Sorrow, MD 02/01/22 [phone]

## 2022-02-01 NOTE — Discharge Instructions (Signed)
Your appointment with your primary care doctor as scheduled for next week.  Return for any new or worse symptoms.  Today's work-up without any acute findings.  Your hemoglobin is remained stable at 9.8.

## 2022-02-01 NOTE — ED Triage Notes (Addendum)
Pt c/o headache, nausea and dizziness x 4 days but symptoms became more severe yesterday at 1800.

## 2022-02-01 NOTE — ED Provider Notes (Signed)
[facility] EMERGENCY DEPT Provider Note   CSN: [id number] Arrival date & time: 02/01/22  1221     History  Chief Complaint  Patient presents with   Headache   Dizziness    Angela Hahn is a 46 y.o. female.  46 year old female presents with intermittent headaches times several days.  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  States that the headache waxes and wanes.  Last for anywhere from 30 minutes to an hour.  Has been associate with dizziness.  Dizziness at times gets worse when she stands up.  No associated chest pain or palpitations.  Denies any history of volume loss.  No fever or chills.  No new medications.      Home Medications Prior to Admission medications   Medication Sig Start Date End Date Taking? Authorizing Provider  acetaminophen (TYLENOL) 500 MG tablet Take 1,000 mg by mouth every 6 (six) hours as needed for mild pain or headache.    [provider]  aspirin EC 81 MG tablet Take 81 mg by mouth daily. Swallow whole.    [provider]  atorvastatin (LIPITOR) 40 MG tablet TAKE ONE TABLET BY MOUTH DAILY 01/18/22   Laurey Morale, MD  diltiazem (CARDIZEM CD) 180 MG 24 hr capsule Take 1 capsule (180 mg total) by mouth daily. 10/04/21 01/26/22  Tobb, Kardie, DO  ferrous sulfate 325 (65 FE) MG tablet Take 1 tablet (325 mg total) by mouth 3 (three) times daily with meals. 01/12/22   Milton Ferguson, MD  LORazepam (ATIVAN) 1 MG tablet Take 1 tablet (1 mg total) by mouth every 8 (eight) hours as needed for anxiety. 10/03/21   Laurey Morale, MD  scopolamine (TRANSDERM SCOP, 1.5 MG,) 1 MG/3DAYS Place 1 patch (1.5 mg total) onto the skin every 3 (three) days. 01/26/22   Laurey Morale, MD  triamcinolone cream (KENALOG) 0.1 % Apply 1 application topically 2 (two) times daily. Patient taking differently: Apply 1 application. topically daily as needed (for Eczema per patient). 10/06/21   Laurey Morale, MD  Cetirizine HCl 10 MG CAPS  Take 1 capsule (10 mg total) by mouth daily. 08/28/19 12/17/20  Wieters, Hallie C, PA-C      Allergies    Gadolinium derivatives, Hydrocodone, and Tramadol    Review of Systems   Review of Systems  All other systems reviewed and are negative.  Physical Exam Updated Vital Signs BP 132/76 (BP Location: Left Arm)   Pulse 92   Temp 98.2 F (36.8 C)   Resp 18   Ht 1.676 m ('5\' 6"'$ )   Wt 89.4 kg   SpO2 100%   BMI 31.80 kg/m  Physical Exam Vitals and nursing note reviewed.  Constitutional:      General: She is not in acute distress.    Appearance: Normal appearance. She is well-developed. She is not toxic-appearing.  HENT:     Head: Normocephalic and atraumatic.  Eyes:     General: Lids are normal.     Conjunctiva/sclera: Conjunctivae normal.     Pupils: Pupils are equal, round, and reactive to light.  Neck:     Thyroid: No thyroid mass.     Trachea: No tracheal deviation.  Cardiovascular:     Rate and Rhythm: Normal rate and regular rhythm.     Heart sounds: Normal heart sounds. No murmur heard.   No gallop.  Pulmonary:     Effort: Pulmonary effort is normal. No respiratory distress.  Breath sounds: Normal breath sounds. No stridor. No decreased breath sounds, wheezing, rhonchi or rales.  Abdominal:     General: There is no distension.     Palpations: Abdomen is soft.     Tenderness: There is no abdominal tenderness. There is no rebound.  Musculoskeletal:        General: No tenderness. Normal range of motion.     Cervical back: Normal range of motion and neck supple.  Skin:    General: Skin is warm and dry.     Findings: No abrasion or rash.  Neurological:     Mental Status: She is alert and oriented to person, place, and time. Mental status is at baseline.     GCS: GCS eye subscore is 4. GCS verbal subscore is 5. GCS motor subscore is 6.     Cranial Nerves: No cranial nerve deficit.     Sensory: No sensory deficit.     Motor: Motor function is intact.   Psychiatric:        Attention and Perception: Attention normal.        Speech: Speech normal.        Behavior: Behavior normal.    ED Results / Procedures / Treatments   Labs (all labs ordered are listed, but only abnormal results are displayed) Labs Reviewed  CBG MONITORING, ED - Abnormal; Notable for the following components:      Result Value   Glucose-Capillary 129 (*)    All other components within normal limits  BASIC METABOLIC PANEL  CBC  URINALYSIS, ROUTINE W REFLEX MICROSCOPIC  PREGNANCY, URINE  PROTIME-INR    EKG EKG Interpretation  Date/Time:  Wednesday February 01 2022 13:22:24 EDT Ventricular Rate:  84 PR Interval:  169 QRS Duration: 95 QT Interval:  384 QTC Calculation: 528 R Axis:   -1 Text Interpretation: Sinus rhythm Left ventricular hypertrophy Confirmed by Lacretia Leigh ([id number]) on 02/01/2022 2:17:41 PM  Radiology CT Head Wo Contrast  Result Date: 02/01/2022 CLINICAL DATA:  Dizziness, non-specific EXAM: CT HEAD WITHOUT CONTRAST TECHNIQUE: Contiguous axial images were obtained from the base of the skull through the vertex without intravenous contrast. RADIATION DOSE REDUCTION: This exam was performed according to the departmental dose-optimization program which includes automated exposure control, adjustment of the mA and/or kV according to patient size and/or use of iterative reconstruction technique. COMPARISON:  08/26/2021 and previous FINDINGS: Brain: No evidence of acute infarction, hemorrhage, hydrocephalus, extra-axial collection or mass lesion/mass effect. Vascular: No hyperdense vessel or unexpected calcification. Skull: Normal. Negative for fracture or focal lesion. Sinuses/Orbits: No acute finding. Other: None IMPRESSION: Negative Electronically Signed   By: Lucrezia Europe M.D.   On: 02/01/2022 14:11    Procedures Procedures    Medications Ordered in ED Medications  metoCLOPramide (REGLAN) injection 5 mg (has no administration in time range)  ketorolac  (TORADOL) 30 MG/ML injection 15 mg (has no administration in time range)  diphenhydrAMINE (BENADRYL) injection 12.5 mg (has no administration in time range)    ED Course/ Medical Decision Making/ A&P                           Medical Decision Making Amount and/or Complexity of Data Reviewed Labs: ordered. Radiology: ordered.  Risk Prescription drug management.   EKG per my interpretation shows sinus rhythm.  Patient head CT which is negative here per my review and interpretation.  Patient's labs are pending at this time.  Care signed out to Dr.  Zackowski        Final Clinical Impression(s) / ED Diagnoses Final diagnoses:  None    Rx / DC Orders ED Discharge Orders     None         Lacretia Leigh, MD 02/01/22 1535

## 2022-02-01 NOTE — ED Notes (Signed)
Patient transported to CT 

## 2022-02-07 NOTE — Telephone Encounter (Signed)
Pt forms completed and faxed as requested, copy in the office, pt will pickup from the office

## 2022-02-09 ENCOUNTER — Ambulatory Visit: Payer: 59 | Admitting: Family Medicine

## 2022-02-10 ENCOUNTER — Encounter: Payer: Self-pay | Admitting: Family Medicine

## 2022-02-10 ENCOUNTER — Ambulatory Visit (INDEPENDENT_AMBULATORY_CARE_PROVIDER_SITE_OTHER): Payer: 59 | Admitting: Family Medicine

## 2022-02-10 VITALS — BP 110/78 | HR 92 | Temp 98.6°F | Wt 213.0 lb

## 2022-02-10 DIAGNOSIS — D500 Iron deficiency anemia secondary to blood loss (chronic): Secondary | ICD-10-CM | POA: Diagnosis not present

## 2022-02-10 DIAGNOSIS — N921 Excessive and frequent menstruation with irregular cycle: Secondary | ICD-10-CM | POA: Diagnosis not present

## 2022-02-10 DIAGNOSIS — I10 Essential (primary) hypertension: Secondary | ICD-10-CM

## 2022-02-10 NOTE — Progress Notes (Signed)
   Subjective:    Patient ID: [name], female    DOB: Oct 05, 1975, 46 y.o.   MRN: [id number]  HPI Here to follow up on iron deficiency anemia. She has had one infusion of Feraheme so far on 01-19-22, and she already feels so much better. Her last Hgb on 02-01-22 was 9.8 (up from 7.7). She is due to have another CBC drawn on 02-27-22. She has been working hard on her weight, and she has been able to lose 26 lbs since March. She feels better and has more energy.    Review of Systems  Constitutional: Negative.   Respiratory: Negative.    Cardiovascular: Negative.        Objective:   Physical Exam Constitutional:      Appearance: Normal appearance.  Cardiovascular:     Rate and Rhythm: Normal rate and regular rhythm.     Pulses: Normal pulses.     Heart sounds: Normal heart sounds.  Neurological:     Mental Status: She is alert.           Assessment & Plan:  She is doing well overall. She is losing weight and eating a healthy diet. She will have her Hgb checked again as above for the anemia. She will see her GYN on 03-07-22 to discuss options to treat her menorrhagia.  Alysia Penna, MD

## 2022-02-15 ENCOUNTER — Ambulatory Visit: Payer: 59 | Admitting: Neurology

## 2022-02-27 ENCOUNTER — Other Ambulatory Visit: Payer: 59

## 2022-02-27 DIAGNOSIS — D500 Iron deficiency anemia secondary to blood loss (chronic): Secondary | ICD-10-CM

## 2022-02-28 LAB — CBC WITH DIFFERENTIAL/PLATELET
Basophils Absolute: 0 10*3/uL (ref 0.0–0.2)
Basos: 1 %
EOS (ABSOLUTE): 0.1 10*3/uL (ref 0.0–0.4)
Eos: 2 %
Hematocrit: 39 % (ref 34.0–46.6)
Hemoglobin: 12.1 g/dL (ref 11.1–15.9)
Immature Grans (Abs): 0 10*3/uL (ref 0.0–0.1)
Immature Granulocytes: 0 %
Lymphocytes Absolute: 1.6 10*3/uL (ref 0.7–3.1)
Lymphs: 37 %
MCH: 23.2 pg — ABNORMAL LOW (ref 26.6–33.0)
MCHC: 31 g/dL — ABNORMAL LOW (ref 31.5–35.7)
MCV: 75 fL — ABNORMAL LOW (ref 79–97)
Monocytes Absolute: 0.6 10*3/uL (ref 0.1–0.9)
Monocytes: 13 %
Neutrophils Absolute: 2.1 10*3/uL (ref 1.4–7.0)
Neutrophils: 47 %
Platelets: 254 10*3/uL (ref 150–450)
RBC: 5.21 x10E6/uL (ref 3.77–5.28)
RDW: 24.2 % — ABNORMAL HIGH (ref 11.7–15.4)
WBC: 4.4 10*3/uL (ref 3.4–10.8)

## 2022-02-28 LAB — SPECIMEN STATUS REPORT

## 2022-03-03 ENCOUNTER — Other Ambulatory Visit: Payer: Self-pay

## 2022-03-03 DIAGNOSIS — D500 Iron deficiency anemia secondary to blood loss (chronic): Secondary | ICD-10-CM

## 2022-03-04 ENCOUNTER — Other Ambulatory Visit: Payer: Self-pay | Admitting: Family Medicine

## 2022-03-06 NOTE — Telephone Encounter (Signed)
Last refill- not listed on medication list Last OV- 02/10/22  No future OV scheduled.  Can this patient receive a refill?

## 2022-03-07 ENCOUNTER — Ambulatory Visit (INDEPENDENT_AMBULATORY_CARE_PROVIDER_SITE_OTHER): Payer: 59 | Admitting: Obstetrics and Gynecology

## 2022-03-07 ENCOUNTER — Encounter: Payer: Self-pay | Admitting: Obstetrics and Gynecology

## 2022-03-07 ENCOUNTER — Other Ambulatory Visit (HOSPITAL_COMMUNITY)
Admission: RE | Admit: 2022-03-07 | Discharge: 2022-03-07 | Disposition: A | Discharge status: Home / Self Care | Payer: 59 | Source: Ambulatory Visit | Attending: Obstetrics and Gynecology | Admitting: Obstetrics and Gynecology

## 2022-03-07 VITALS — Wt 201.9 lb

## 2022-03-07 DIAGNOSIS — N920 Excessive and frequent menstruation with regular cycle: Secondary | ICD-10-CM | POA: Diagnosis not present

## 2022-03-07 DIAGNOSIS — Z124 Encounter for screening for malignant neoplasm of cervix: Secondary | ICD-10-CM | POA: Diagnosis not present

## 2022-03-07 DIAGNOSIS — N939 Abnormal uterine and vaginal bleeding, unspecified: Secondary | ICD-10-CM

## 2022-03-07 DIAGNOSIS — N946 Dysmenorrhea, unspecified: Secondary | ICD-10-CM | POA: Diagnosis not present

## 2022-03-07 DIAGNOSIS — Z1231 Encounter for screening mammogram for malignant neoplasm of breast: Secondary | ICD-10-CM

## 2022-03-07 LAB — POCT PREGNANCY, URINE: Preg Test, Ur: NEGATIVE

## 2022-03-07 NOTE — Progress Notes (Signed)
Patient being seen in office as new GYN, patient with concerns of issues with cycles. State they last anywhere from 8-10 days, will go off and then cycle comes back just a few days, passing large size clots per patient size of grapefruit, experiences headaches, leg pain. Has received iron infusions in the past and was suggested to have a blood transfusion but declined.  Hx of stroke 06/23/2021.    Altha Harm, CMA

## 2022-03-07 NOTE — Progress Notes (Signed)
Obstetrics and Gynecology New Patient Evaluation  Appointment Date: 03/07/2022  OBGYN Clinic: [facility]  Primary Care Provider: Laurey Morale  Referring Provider: Berniece Salines, DO  Chief Complaint:  Chief Complaint  Patient presents with   Menstrual Problem    History of Present Illness: Angela Hahn is a 46 y.o. African-American [phone] (Patient's last menstrual period was 02/25/2022 (exact date).), seen for the above chief complaint. Her past medical history is significant for h/o 10/22 CVA, BTL, h/o anemia, BMI 33  Patient has long standing h/o heavy and painful periods that pre-date her stroke and was being controlled with Sprintec. Exact etiology of stroke unclear but felt to be caused or at least contributed by being on the [place] and/or phentermine; pt currently only on low dose ASA  She denies any intermenstrual bleeding and her periods come on about q3wks and can last for 7-14d and are heavy and painful; she confirms that she has never had a pelvic u/s before. Period just ending and started about a week ago.   She denies any menopausal s/s.   Review of Systems: Pertinent items noted in HPI and remainder of comprehensive ROS otherwise negative.   Patient Active Problem List   Diagnosis Date Noted   Dysmenorrhea 03/07/2022   Iron deficiency anemia 01/26/2022   HTN (hypertension) 09/05/2021   Anxiety disorder 07/07/2021   Mixed hyperlipidemia 07/04/2021   CVA (cerebral vascular accident) ([place]) 06/23/2021   Menorrhagia with regular cycle 10/27/2020   Pain of right thumb 06/04/2018   Chronic low back pain 06/18/2012    Past Medical History:  Past Medical History:  Diagnosis Date   Anemia    Chronic headaches    Concussion with loss of consciousness 09/24/2015   COVID-19 virus infection 03/08/2021   Dysmenorrhea    Heart murmur    dx'd since childhood   Stroke [facility])     Past Surgical History:  Past Surgical History:   Procedure Laterality Date   BUBBLE STUDY  07/14/2021   Procedure: BUBBLE STUDY;  Surgeon: Skeet Latch, MD;  Location: [facility];  Service: Cardiovascular;;   TEE WITHOUT CARDIOVERSION N/A 07/14/2021   Procedure: TRANSESOPHAGEAL ECHOCARDIOGRAM (TEE);  Surgeon: Skeet Latch, MD;  Location: [facility] ENDOSCOPY;  Service: Cardiovascular;  Laterality: N/A;   TUBAL LIGATION      Past Obstetrical History:  OB History  Gravida Para Term Preterm AB Living  '3 3 3     3  '$ SAB IAB Ectopic Multiple Live Births               # Outcome Date GA Lbr Len/2nd Weight Sex Delivery Anes PTL Lv  3 Term           2 Term           1 Term             Obstetric Comments  SVD x 3.     Past Gynecological History: As per HPI.  Social History:  Social History   Socioeconomic History   Marital status: Single    Spouse name: Not on file   Number of children: 3   Years of education: Not on file   Highest education level: Some college, no degree  Occupational History   Not on file  Tobacco Use   Smoking status: Former    Types: Cigarettes    Quit date: 05/28/2014    Years since quitting: 7.7   Smokeless tobacco: Never  Vaping Use  Vaping Use: Never used  Substance and Sexual Activity   Alcohol use: No    Alcohol/week: 0.0 standard drinks of alcohol   Drug use: No   Sexual activity: Yes    Partners: Male    Birth control/protection: Surgical    Comment: Tubal  Other Topics Concern   Not on file  Social History Narrative   Caffeine- might have 1 c coffee   Social Determinants of Radio broadcast assistant Strain: Not on file  Food Insecurity: Not on file  Transportation Needs: Not on file  Physical Activity: Not on file  Stress: Not on file  Social Connections: Not on file  Intimate Partner Violence: Not on file    Family History:  Family History  Problem Relation Age of Onset   Arthritis Mother    Depression Mother    Hypertension Mother    Diabetes Mother     Hypertension Father    Diabetes Father    Medications Julaine O. Hogen had no medications administered during this visit. Current Outpatient Medications  Medication Sig Dispense Refill   acetaminophen (TYLENOL) 500 MG tablet Take 1,000 mg by mouth every 6 (six) hours as needed for mild pain or headache.     aspirin EC 81 MG tablet Take 81 mg by mouth daily. Swallow whole.     atorvastatin (LIPITOR) 40 MG tablet TAKE ONE TABLET BY MOUTH DAILY 90 tablet 0   ferrous sulfate 325 (65 FE) MG tablet Take 1 tablet (325 mg total) by mouth 3 (three) times daily with meals. 100 tablet 0   ibuprofen (ADVIL) 800 MG tablet TAKE ONE TABLET BY MOUTH THREE TIMES A DAY 90 tablet 5   LORazepam (ATIVAN) 1 MG tablet Take 1 tablet (1 mg total) by mouth every 8 (eight) hours as needed for anxiety. 90 tablet 2   diltiazem (CARDIZEM CD) 180 MG 24 hr capsule Take 1 capsule (180 mg total) by mouth daily. 90 capsule 3   scopolamine (TRANSDERM SCOP, 1.5 MG,) 1 MG/3DAYS Place 1 patch (1.5 mg total) onto the skin every 3 (three) days. (Patient not taking: Reported on 03/07/2022) 10 patch 0   triamcinolone cream (KENALOG) 0.1 % Apply 1 application topically 2 (two) times daily. (Patient not taking: Reported on 03/07/2022) 15 g 1   No current facility-administered medications for this visit.    Allergies Gadolinium derivatives, Hydrocodone, and Tramadol   Physical Exam:  Wt 201 lb 14.4 oz (91.6 kg)   LMP 02/25/2022 (Exact Date)   BMI 32.59 kg/m  Body mass index is 32.59 kg/m.  General appearance: Well nourished, well developed female in no acute distress.  Neck:  Supple, normal appearance, and no thyromegaly  Cardiovascular: normal s1 and s2.  No murmurs, rubs or gallops. Respiratory:  Clear to auscultation bilateral. Normal respiratory effort Abdomen: positive bowel sounds and no masses, hernias; diffusely non tender to palpation, non distended Neuro/Psych:  Normal mood and affect.  Skin:  Warm and dry.   Lymphatic:  No inguinal lymphadenopathy.   Pelvic exam: is limited by body habitus EGBUS: within normal limits Vagina: scant old blood in the vault, no active bleeding Cervix: normal appearing cervix without tenderness, discharge or lesions. Uterus:  nonenlarged and non tender Adnexa:  normal adnexa and no mass, fullness, tenderness Rectovaginal: deferred  See procedure note for embx. Of note, pt sounded to approximately 13cm  Laboratory: normal tsh this year.    Latest Ref Rng & Units 02/27/2022   12:00 AM 02/01/2022  3:01 PM 01/26/2022    3:43 PM  CBC  WBC 3.4 - 10.8 x10E3/uL 4.4  3.8  5.4   Hemoglobin 11.1 - 15.9 g/dL 12.1  9.8  9.8   Hematocrit 34.0 - 46.6 % 39.0  34.2  31.9   Platelets 150 - 450 x10E3/uL 254  189  253.0    Radiology: none  Assessment: pt doing well  Plan:  1. Papanicolaou smear - Cytology - PAP( [facility]) - Cervicovaginal ancillary only( [facility])  2. Visit for screening mammogram - MM Digital Screening; Future  3. Abnormal uterine bleeding (AUB) Message sent to Neurology to see if Lysteda would be acceptable. F/u labs and early cycle u/s - US PELVIC COMPLETE WITH TRANSVAGINAL; Future - Surgical pathology( [facility]/ [facility])  4. Dysmenorrhea  5. Menorrhagia with regular cycle  RTC after u/s.   Durene Romans MD Attending Center for [facility] [employer] farm manager)

## 2022-03-08 ENCOUNTER — Other Ambulatory Visit: Payer: Self-pay

## 2022-03-08 ENCOUNTER — Encounter: Payer: Self-pay | Admitting: Obstetrics and Gynecology

## 2022-03-08 LAB — CERVICOVAGINAL ANCILLARY ONLY
Bacterial Vaginitis (gardnerella): POSITIVE — AB
Candida Glabrata: NEGATIVE
Candida Vaginitis: NEGATIVE
Chlamydia: NEGATIVE
Comment: NEGATIVE
Comment: NEGATIVE
Comment: NEGATIVE
Comment: NEGATIVE
Comment: NEGATIVE
Comment: NORMAL
Neisseria Gonorrhea: NEGATIVE
Trichomonas: NEGATIVE

## 2022-03-08 MED ORDER — METRONIDAZOLE 500 MG PO TABS: 500.0000 mg | ORAL_TABLET | Freq: Two times a day (BID) | ORAL | 0 refills | Status: DC

## 2022-03-09 LAB — SURGICAL PATHOLOGY

## 2022-03-10 LAB — CYTOLOGY - PAP
Chlamydia: NEGATIVE
Comment: NEGATIVE
Comment: NEGATIVE
Comment: NORMAL
Diagnosis: NEGATIVE
High risk HPV: NEGATIVE
Neisseria Gonorrhea: NEGATIVE

## 2022-03-15 ENCOUNTER — Ambulatory Visit
Admission: RE | Admit: 2022-03-15 | Discharge: 2022-03-15 | Disposition: A | Discharge status: Home / Self Care | Payer: 59 | Source: Ambulatory Visit | Attending: Obstetrics and Gynecology | Admitting: Obstetrics and Gynecology

## 2022-03-15 DIAGNOSIS — N939 Abnormal uterine and vaginal bleeding, unspecified: Secondary | ICD-10-CM | POA: Diagnosis present

## 2022-03-18 ENCOUNTER — Encounter: Payer: Self-pay | Admitting: Obstetrics and Gynecology

## 2022-03-18 DIAGNOSIS — D219 Benign neoplasm of connective and other soft tissue, unspecified: Secondary | ICD-10-CM | POA: Insufficient documentation

## 2022-03-20 ENCOUNTER — Other Ambulatory Visit: Payer: Self-pay | Admitting: Family Medicine

## 2022-03-20 DIAGNOSIS — E785 Hyperlipidemia, unspecified: Secondary | ICD-10-CM

## 2022-03-20 NOTE — Telephone Encounter (Signed)
Last Rx given on 2/6 for #90 with 2 ref

## 2022-03-23 ENCOUNTER — Telehealth: Payer: Self-pay | Admitting: Obstetrics and Gynecology

## 2022-03-23 NOTE — Telephone Encounter (Signed)
GYN Telephone Note  Patient called with u/s results. I d/w her that I spoke to her neurologist Dr. Leonie Man who said that she is outside of the six month high risk recurrence for CVA and that Lysteda is acceptable if no other options exisit. She says she was on the tri sprintec for close to a year when the CVA occurred. Reasons still unknown and no e/o brain blood clot seen  I told her that her options would be hysterectomy or myomectomy; given potential submucosal component I don't feel [place] would be a good option. I d/w her that both options are major surgeries and the  risks of surgery were discussed in detail with the patient including but not limited to: bleeding which may require transfusion or reoperation; infection which may require prolonged hospitalization or re-hospitalization and antibiotic therapy; injury to bowel, bladder, ureters and major vessels or other surrounding organs which may lead to other procedures; formation of adhesions; need for additional procedures including laparotomy or subsequent procedures secondary to intraoperative injury or abnormal pathology; thromboembolic phenomenon; incisional problems and other postoperative or anesthesia complications.  Patient was told that the likelihood that her condition and symptoms will be treated effectively with this surgical management was very high; the postoperative expectations were also discussed in detail. The patient also understands the alternative treatment options which were discussed in full.   Based on this,she states she desires to do hysterectomy, with possible need for open procedure. Request sent for TLH/BS and cystoscopy  I also told her that if it will be sometime before the hysterectomy is done that trying Lysteda would be reasonable with it being an off label use with risk of blood clot, stroke, etc given her history; she was amenable to trial if needed.   Durene Romans MD Attending Center for [facility]  (Faculty Practice) 03/23/2022 Time: 1258pm

## 2022-03-24 ENCOUNTER — Telehealth: Payer: Self-pay

## 2022-03-24 NOTE — Telephone Encounter (Signed)
Called and spoke to patient about potential surgery date and changes to her insurance coverage. ([employer] will be terminated at the end of August, first available surgery date is 09/12). Patient contacted her insurance company and was told she would auto enroll in [place] on 09/01

## 2022-03-31 ENCOUNTER — Other Ambulatory Visit: Payer: Self-pay | Admitting: Obstetrics and Gynecology

## 2022-03-31 ENCOUNTER — Telehealth: Payer: Self-pay

## 2022-03-31 ENCOUNTER — Ambulatory Visit: Payer: 59 | Admitting: Obstetrics and Gynecology

## 2022-03-31 MED ORDER — MEGESTROL ACETATE 40 MG PO TABS: 80.0000 mg | ORAL_TABLET | Freq: Two times a day (BID) | ORAL | 1 refills | Status: AC

## 2022-03-31 NOTE — Telephone Encounter (Signed)
I called patient at the request of Dr. Ilda Basset to check in on her. Patient has surgery scheduled 05/09/22. Per patient she started her menstrual period on 03/28/22 and says it has been very heavy and very painful. I reviewed this with Dr. Ilda Basset who sent in prescription of Lysteda for patient. I informed patient of this. Patient states she will pick up prescription of Lysteda and try that. Per patient she will let us know if the Marshell Levan is not improving her menstrual period.   Paulina Fusi, RN 03/31/22

## 2022-03-31 NOTE — Progress Notes (Signed)
Patient having really heavy and painful periods that started a few days ago. Will try oral progestins and if no help Loyal Buba MD Attending Center for [facility] (Faculty Practice) 03/31/2022 Time: [phone]

## 2022-04-04 ENCOUNTER — Encounter: Payer: Self-pay | Admitting: Obstetrics and Gynecology

## 2022-04-19 ENCOUNTER — Other Ambulatory Visit: Payer: Self-pay | Admitting: Obstetrics and Gynecology

## 2022-04-19 DIAGNOSIS — Z01818 Encounter for other preprocedural examination: Secondary | ICD-10-CM

## 2022-04-24 ENCOUNTER — Other Ambulatory Visit: Payer: Self-pay | Admitting: Family Medicine

## 2022-04-26 ENCOUNTER — Telehealth: Payer: Self-pay | Admitting: *Deleted

## 2022-04-26 NOTE — Telephone Encounter (Signed)
I was requested to contact patient regarding updating her insurance information and sign Hysterectomy Statement . I called Angela Hahn and she reports she has already update her insurance information with [place]. I explained since she has Medicaid secondary she needs to come in to sign Hysterectomy statement. She states her Medicaid does not cover surgery, only pap. I explained I will pass that information to surgery scheduler. Staci Acosta

## 2022-04-28 NOTE — Progress Notes (Signed)
Surgical Instructions    Your procedure is scheduled on 05/09/22.  Report to [facility] Main Entrance "A" at 7:15 A.M., then check in with the Admitting office.  Call this number if you have problems the morning of surgery:  [phone]   If you have any questions prior to your surgery date call [phone]: Open Monday-Friday 8am-4pm    Remember:  Do not eat after midnight the night before your surgery  You may drink clear liquids until 6:15am the morning of your surgery.   Clear liquids allowed are: Water, Non-Citrus Juices (without pulp), Carbonated Beverages, Clear Tea, Black Coffee ONLY (NO MILK, CREAM OR POWDERED CREAMER of any kind), and Gatorade    Take these medicines the morning of surgery with A SIP OF WATER:  atorvastatin (LIPITOR) diltiazem (CARDIZEM CD)  IF NEEDED: acetaminophen (TYLENOL)  LORazepam (ATIVAN)  As of today, STOP taking any Aspirin (unless otherwise instructed by your surgeon) Aleve, Naproxen, Ibuprofen, Motrin, Advil, Goody's, BC's, all herbal medications, fish oil, and all vitamins.           Do not wear jewelry or makeup. Do not wear lotions, powders, perfumes/cologne or deodorant. Do not shave 48 hours prior to surgery.   Do not bring valuables to the hospital. Do not wear nail polish, gel polish, artificial nails, or any other type of covering on natural nails (fingers and toes) If you have artificial nails or gel coating that need to be removed by a nail salon, please have this removed prior to surgery. Artificial nails or gel coating may interfere with anesthesia's ability to adequately monitor your vital signs.  [facility] is not responsible for any belongings or valuables.    Do NOT Smoke (Tobacco/Vaping)  24 hours prior to your procedure  If you use a CPAP at night, you may bring your mask for your overnight stay.   Contacts, glasses, hearing aids, dentures or partials may not be worn into surgery, please bring cases for these  belongings   For patients admitted to the hospital, discharge time will be determined by your treatment team.   Patients discharged the day of surgery will not be allowed to drive home, and someone needs to stay with them for 24 hours.   SURGICAL WAITING ROOM VISITATION Patients having surgery or a procedure may have no more than 2 support people in the waiting area - these visitors may rotate.   Children under the age of 35 must have an adult with them who is not the patient. If the patient needs to stay at the hospital during part of their recovery, the visitor guidelines for inpatient rooms apply. Pre-op nurse will coordinate an appropriate time for 1 support person to accompany patient in pre-op.  This support person may not rotate.   Please refer to the [facility] website for the visitor guidelines for Inpatients (after your surgery is over and you are in a regular room).    Special instructions:    Oral Hygiene is also important to reduce your risk of infection.  Remember - BRUSH YOUR TEETH THE MORNING OF SURGERY WITH YOUR REGULAR TOOTHPASTE   [place]- Preparing For Surgery  Before surgery, you can play an important role. Because skin is not sterile, your skin needs to be as free of germs as possible. You can reduce the number of germs on your skin by washing with CHG (chlorahexidine gluconate) Soap before surgery.  CHG is an antiseptic cleaner which kills germs and bonds with the skin to  continue killing germs even after washing.     Please do not use if you have an allergy to CHG or antibacterial soaps. If your skin becomes reddened/irritated stop using the CHG.  Do not shave (including legs and underarms) for at least 48 hours prior to first CHG shower. It is OK to shave your face.  Please follow these instructions carefully.     Shower the NIGHT BEFORE SURGERY and the MORNING OF SURGERY with CHG Soap.   If you chose to wash your hair, wash your hair first as usual with  your normal shampoo. After you shampoo, rinse your hair and body thoroughly to remove the shampoo.  Then [employer] and genitals (private parts) with your normal soap and rinse thoroughly to remove soap.  After that Use CHG Soap as you would any other liquid soap. You can apply CHG directly to the skin and wash gently with a scrungie or a clean washcloth.   Apply the CHG Soap to your body ONLY FROM THE NECK DOWN.  Do not use on open wounds or open sores. Avoid contact with your eyes, ears, mouth and genitals (private parts). Wash Face and genitals (private parts)  with your normal soap.   Wash thoroughly, paying special attention to the area where your surgery will be performed.  Thoroughly rinse your body with warm water from the neck down.  DO NOT shower/wash with your normal soap after using and rinsing off the CHG Soap.  Pat yourself dry with a CLEAN TOWEL.  Wear CLEAN PAJAMAS to bed the night before surgery  Place CLEAN SHEETS on your bed the night before your surgery  DO NOT SLEEP WITH PETS.   Day of Surgery: Take a shower with CHG soap. Wear Clean/Comfortable clothing the morning of surgery Do not apply any deodorants/lotions.   Remember to brush your teeth WITH YOUR REGULAR TOOTHPASTE.    If you received a COVID test during your pre-op visit, it is requested that you wear a mask when out in public, stay away from anyone that may not be feeling well, and notify your surgeon if you develop symptoms. If you have been in contact with anyone that has tested positive in the last 10 days, please notify your surgeon.    Please read over the following fact sheets that you were given.

## 2022-05-02 ENCOUNTER — Encounter (HOSPITAL_COMMUNITY): Payer: Self-pay

## 2022-05-02 ENCOUNTER — Encounter (HOSPITAL_COMMUNITY)
Admission: RE | Admit: 2022-05-02 | Discharge: 2022-05-02 | Disposition: A | Discharge status: Home / Self Care | Payer: Commercial Managed Care - HMO | Source: Ambulatory Visit | Attending: Obstetrics and Gynecology | Admitting: Obstetrics and Gynecology

## 2022-05-02 ENCOUNTER — Other Ambulatory Visit: Payer: Self-pay

## 2022-05-02 DIAGNOSIS — Z8673 Personal history of transient ischemic attack (TIA), and cerebral infarction without residual deficits: Secondary | ICD-10-CM | POA: Diagnosis not present

## 2022-05-02 DIAGNOSIS — D259 Leiomyoma of uterus, unspecified: Secondary | ICD-10-CM | POA: Diagnosis not present

## 2022-05-02 DIAGNOSIS — N920 Excessive and frequent menstruation with regular cycle: Secondary | ICD-10-CM | POA: Insufficient documentation

## 2022-05-02 DIAGNOSIS — Z01818 Encounter for other preprocedural examination: Secondary | ICD-10-CM

## 2022-05-02 DIAGNOSIS — Z01812 Encounter for preprocedural laboratory examination: Secondary | ICD-10-CM | POA: Insufficient documentation

## 2022-05-02 HISTORY — DX: Sleep apnea, unspecified: G47.30

## 2022-05-02 HISTORY — DX: Anxiety disorder, unspecified: F41.9

## 2022-05-02 HISTORY — DX: Unspecified osteoarthritis, unspecified site: M19.90

## 2022-05-02 LAB — COMPREHENSIVE METABOLIC PANEL
ALT: 15 U/L (ref 0–44)
AST: 15 U/L (ref 15–41)
Albumin: 3.9 g/dL (ref 3.5–5.0)
Alkaline Phosphatase: 61 U/L (ref 38–126)
Anion gap: 6 (ref 5–15)
BUN: 9 mg/dL (ref 6–20)
CO2: 25 mmol/L (ref 22–32)
Calcium: 9.2 mg/dL (ref 8.9–10.3)
Chloride: 108 mmol/L (ref 98–111)
Creatinine, Ser: 0.77 mg/dL (ref 0.44–1.00)
GFR, Estimated: >60 mL/min (ref >60)
Glucose, Bld: 109 mg/dL — ABNORMAL HIGH (ref 70–99)
Potassium: 4.1 mmol/L (ref 3.5–5.1)
Sodium: 139 mmol/L (ref 135–145)
Total Bilirubin: 0.2 mg/dL — ABNORMAL LOW (ref 0.3–1.2)
Total Protein: 7.5 g/dL (ref 6.5–8.1)

## 2022-05-02 LAB — CBC
HCT: 28 % — ABNORMAL LOW (ref 36.0–46.0)
Hemoglobin: 8.5 g/dL — ABNORMAL LOW (ref 12.0–15.0)
MCH: 23.5 pg — ABNORMAL LOW (ref 26.0–34.0)
MCHC: 30.4 g/dL (ref 30.0–36.0)
MCV: 77.3 fL — ABNORMAL LOW (ref 80.0–100.0)
Platelets: 326 10*3/uL (ref 150–400)
RBC: 3.62 MIL/uL — ABNORMAL LOW (ref 3.87–5.11)
RDW: 16 % — ABNORMAL HIGH (ref 11.5–15.5)
WBC: 5.8 10*3/uL (ref 4.0–10.5)
nRBC: 0 % (ref 0.0–0.2)

## 2022-05-02 NOTE — Progress Notes (Signed)
PCP - Alysia Penna MD Cardiologist - Godfrey Pick Tobb  PPM/ICD - denies  Chest x-ray - 09/30/21 EKG - 02/01/22 Stress Test - denies ECHO - 09/13/20 Cardiac Cath - denies  Sleep Study - yes with dx CPAP - denies  No diabetes  As of today, STOP taking any Aspirin (unless otherwise instructed by your surgeon) Aleve, Naproxen, Ibuprofen, Motrin, Advil, Goody's, BC's, all herbal medications, fish oil, and all vitamins.  ERAS Protcol - yes PRE-SURGERY Ensure or G2- n/a  COVID TEST- n/a  Anesthesia review: yes, cardiac history  Patient denies shortness of breath, fever, cough and chest pain at PAT appointment   All instructions explained to the patient, with a verbal understanding of the material. Patient agrees to go over the instructions while at home for a better understanding. Patient also instructed to self quarantine after being tested for COVID-19. The opportunity to ask questions was provided.

## 2022-05-03 NOTE — Progress Notes (Addendum)
Anesthesia Chart Review:  Case: [id number] Date/Time: 05/09/22 0902   Procedures:      TOTAL LAPAROSCOPIC HYSTERECTOMY WITH SALPINGECTOMY (Bilateral)     CYSTOSCOPY     HYSTERECTOMY ABDOMINAL WITH SALPINGECTOMY (Bilateral)   Anesthesia type: Choice   Pre-op diagnosis:      Menorrhagia     Fibroids   Location: [facility] OR ROOM 07 / [place] OR   Surgeons: Aletha Halim, MD       DISCUSSION: Patient is a 46 year old female scheduled for the above procedure.  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   Last visit with cardiologist Dr. Harriet Masson was on 10/04/2021.  Her Cardizem was increased for palpitations.  She was referred to GYN for irregular menses.  She remained on aspirin and atorvastatin for CVA.  Follow-up around 6 months planned.  H/H down to 8.5/28.0. Known IDA with menorrhagia.  Received Feraheme in May. She is on ferrous sulfate BID per medication list. Will send communication to Dr. Ilda Basset. Per her 03/31/22 note, he prescribed Lysteda for "really heavy and painful periods", although I don't see it listed on her current medication list. He did contact Dr. Leonie Man to discuss medication options given her CVA in 05/2021 that occurred while on Tri Sprintec and phentermine (see 03/23/22 telephone encounter).   ADDENDUM 05/04/22 9:46 AM: Dr. Ilda Basset had reviewed labs, and I also discussed with anesthesiologist [place], Iona Beard, DO. Recommendation to have 2 units PRBC available if needed. Prepare RBC order entered and also notified Blood Bank. Patient already has a T&S specimen from 05/02/22, but she will also require a ABO/Rh drawn on the day of surgery. She is for these labs and urine pregnancy test on arrival. Anesthesia team to evaluate on the day of surgery.    VS: BP 116/71   Pulse 83   Temp 37 C (Oral)   Resp 17   Ht  '5\' 6"'$  (1.676 m)   Wt 93.6 kg   LMP 04/26/2022   SpO2 100%   BMI 33.30 kg/m    PROVIDERS: Laurey Morale, MD is PCP Berniece Salines, DO is cardiologist Antony Contras, MD is neurologist last visit 11/03/2021.  1 year follow-up planned.   LABS: Preoperative labs noted. H/H down to 8.5/28.0. Previously 12.1/39.0 on 02/27/22 and 9.8/34.2 on 02/01/22. See DISCUSSION.  (all labs ordered are listed, but only abnormal results are displayed)  Labs Reviewed  CBC - Abnormal; Notable for the following components:      Result Value   RBC 3.62 (*)    Hemoglobin 8.5 (*)    HCT 28.0 (*)    MCV 77.3 (*)    MCH 23.5 (*)    RDW 16.0 (*)    All other components within normal limits  COMPREHENSIVE METABOLIC PANEL - Abnormal; Notable for the following components:   Glucose, Bld 109 (*)    Total Bilirubin 0.2 (*)    All other components within normal limits  TYPE AND SCREEN      Latest Ref Rng & Units 05/02/2022    2:07 PM 02/27/2022   12:00 AM 02/01/2022    3:01 PM  CBC  WBC 4.0 - 10.5 K/uL 5.8  4.4  3.8   Hemoglobin 12.0 - 15.0 g/dL 8.5  12.1  9.8   Hematocrit 36.0 - 46.0 % 28.0  39.0  34.2   Platelets 150 - 400 K/uL 326  254  189      Home Sleep Study 11/23/21: IMPRESSION: OSA (obstructive sleep apnea)  RECOMMENDATION:  This home sleep test demonstrates moderate obstructive sleep apnea with a total AHI of 19.5/hour and O2 nadir of 71% with time below or at 88% saturation of over 5 minutes for the night.  Mild to moderate snoring was detected.  Treatment with positive airway pressure is recommended.   IMAGES: CT Head 02/01/22: IMPRESSION: Negative  1V PCXR 09/30/21: FINDINGS: The heart size and mediastinal contours are within normal limits. Both lungs are clear. The visualized skeletal structures are unremarkable. IMPRESSION: No active disease.  CTA Head/Neck 06/23/21: IMPRESSION: - No large vessel occlusion. No hemodynamically significant stenosis in the neck. - Probable stenosis or  occlusion of a small mid right M2 MCA branch in the region of insular infarct on MRI. This may correspond to abnormality seen on MRA. - 2 mm right upper lobe nodule. No follow-up needed if patient is low-risk. Non-contrast chest CT can be considered in 12 months if patient is high-risk. This recommendation follows the consensus statement: Guidelines for Management of Incidental Pulmonary Nodules Detected on CT Images: From the Fleischner Society 2017; Radiology 2017; 284:228-243.     EKG: 02/01/22:  Sinus rhythm Left ventricular hypertrophy Confirmed by Lacretia Leigh ([id number]) on 02/01/2022 2:17:41 PM   CV: Long term cardiac monitor 07/14/21: - Patch Wear Time:  13 days and 10 hours starting July 14, 2021. Indications - Patient had a min HR of 63 bpm, max HR of 214 bpm, and avg HR of 94 bpm. - Predominant underlying rhythm was Sinus Rhythm. Slight P wave morphology changes were noted. - 1 run of Ventricular Tachycardia occurred lasting 5 beats with a max rate of 214 bpm (avg 187 bpm).  - 2 Supraventricular Tachycardia runs occurred, the run with the fastest interval lasting 6 beats with a max rate of 138 bpm (avg 114 bpm); the run with the fastest interval was also the longest.  - Premature atrial complexes were rare (<1.0%), SVE Couplets were rare (<1.0%), and SVE Triplets were rare (<1.0%). Isolated VEs were rare (<1.0%, 47), VE Couplets were rare (<1.0%, 4), and VE Triplets were rare  (<1.0%, 1). - No atrial fibrillation noted. - Symptoms are associated with sinus rhythm and sinus tachycardia.   Conclusion: This study is remarkable for the following: 1.  Short run of nonsustained ventricular tachycardia 2.  Rare paroxysmal supraventricular tachycardia which is likely atrial tachycardia.   TEE 07/14/21: IMPRESSIONS   1. Left ventricular ejection fraction, by estimation, is 60 to 65%. The  left ventricle has normal function. The left ventricle has no regional  wall motion  abnormalities.   2. Right ventricular systolic function is normal. The right ventricular  size is normal.   3. No left atrial/left atrial appendage thrombus was detected. The LAA  emptying velocity was 124 cm/s.   4. The mitral valve is normal in structure. Trivial mitral valve  regurgitation. No evidence of mitral stenosis.   5. The aortic valve is tricuspid. Aortic valve regurgitation is not  visualized. No aortic stenosis is present.   6. The inferior vena cava is normal in size with greater than 50%  respiratory variability, suggesting right atrial pressure of 3 mmHg.   7. Agitated saline contrast bubble study was negative, with no evidence  of any interatrial shunt.  - Conclusion(s)/Recommendation(s): Normal biventricular function without  evidence of hemodynamically significant valvular heart disease. No LA/LAA  thrombus identified. Negative bubble study for interatrial shunt.  No  intracardiac source of embolism detected   on this on this transesophageal echocardiogram.    TTE 06/24/21: IMPRESSIONS   1. Left ventricular ejection fraction, by estimation, is 60 to 65%. The  left ventricle has normal function. The left ventricle has no regional  wall motion abnormalities. Left ventricular diastolic parameters were  normal.   2. Right ventricular systolic function is normal. The right ventricular  size is normal.   3. The mitral valve is grossly normal. Trivial mitral valve  regurgitation.   4. The aortic valve is normal in structure. Aortic valve regurgitation is  not visualized. No aortic stenosis is present.    Transcranial Doppler 06/24/21: Summary:  No HITS at rest or during Valsalva. Negative transcranial Doppler Bubble  study with no evidence of right to left intracardiac communication.      Past Medical History:  Diagnosis Date   Anemia    Anxiety    Arthritis    7-8 years ago patient was told she had degenerative arthritis in her back   Chronic headaches     Concussion with loss of consciousness 09/24/2015   COVID-19 virus infection 03/08/2021   Dysmenorrhea    Heart murmur    dx'd since childhood   Sleep apnea    Stroke [facility])     Past Surgical History:  Procedure Laterality Date   BUBBLE STUDY  07/14/2021   Procedure: BUBBLE STUDY;  Surgeon: Skeet Latch, MD;  Location: [place];  Service: Cardiovascular;;   TEE WITHOUT CARDIOVERSION N/A 07/14/2021   Procedure: TRANSESOPHAGEAL ECHOCARDIOGRAM (TEE);  Surgeon: Skeet Latch, MD;  Location: [facility] ENDOSCOPY;  Service: Cardiovascular;  Laterality: N/A;   TUBAL LIGATION      MEDICATIONS:  acetaminophen (TYLENOL) 500 MG tablet   aspirin EC 81 MG tablet   atorvastatin (LIPITOR) 40 MG tablet   diltiazem (CARDIZEM CD) 180 MG 24 hr capsule   ferrous sulfate 325 (65 FE) MG tablet   ibuprofen (ADVIL) 800 MG tablet   LORazepam (ATIVAN) 1 MG tablet   metroNIDAZOLE (FLAGYL) 500 MG tablet   scopolamine (TRANSDERM SCOP, 1.5 MG,) 1 MG/3DAYS   triamcinolone cream (KENALOG) 0.1 %   No current facility-administered medications for this encounter.    Myra Gianotti, PA-C Surgical Short Stay/Anesthesiology [facility] Phone [phone] [facility] Phone [phone] 05/03/2022 4:40 PM

## 2022-05-04 NOTE — Anesthesia Preprocedure Evaluation (Signed)
Anesthesia Evaluation  Patient identified by MRN, date of birth, ID band Patient awake    Reviewed: Allergy & Precautions, NPO status , Patient's Chart, lab work & pertinent test results  Airway Mallampati: III  TM Distance: >3 FB Neck ROM: Full    Dental  (+) Teeth Intact, Dental Advisory Given   Pulmonary sleep apnea (lost weight since sleep study ) , former smoker,  Quit smoking 2020    Pulmonary exam normal breath sounds clear to auscultation       Cardiovascular hypertension (127/63 in preop), Pt. 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, residual L hand weakness), No Residual Symptoms    GI/Hepatic negative GI ROS, Neg liver ROS,   Endo/Other  Obesity BMI 33  Renal/GU negative Renal ROS  negative genitourinary   Musculoskeletal  (+) Arthritis , Osteoarthritis,    Abdominal (+) + obese,   Peds  Hematology  (+) Blood dyscrasia, anemia , Hb 8.5   Anesthesia Other Findings   Reproductive/Obstetrics negative OB ROS                          Anesthesia Physical Anesthesia Plan  ASA: 3  Anesthesia Plan: General   Post-op Pain Management: Tylenol PO (pre-op)* and Toradol IV (intra-op)*   Induction: Intravenous  PONV Risk Score and Plan: 3 and Ondansetron, Dexamethasone, Midazolam and Treatment may vary due to age or medical condition  Airway Management Planned: Oral ETT  Additional Equipment: None  Intra-op Plan:   Post-operative Plan: Extubation in OR  Informed Consent: I have reviewed the patients History and Physical, chart, labs and discussed the procedure including the risks, benefits and alternatives for the proposed anesthesia with the patient or authorized representative who has indicated his/her understanding and acceptance.      Dental advisory given  Plan Discussed with: CRNA  Anesthesia Plan Comments: (Cross x 2 units, starting Hb 8 )      Anesthesia Quick Evaluation

## 2022-05-09 ENCOUNTER — Other Ambulatory Visit: Payer: Self-pay

## 2022-05-09 ENCOUNTER — Observation Stay (HOSPITAL_COMMUNITY): Payer: Commercial Managed Care - HMO | Admitting: Vascular Surgery

## 2022-05-09 ENCOUNTER — Encounter (HOSPITAL_COMMUNITY)
Admission: AD | Disposition: A | Discharge status: Home / Self Care | Payer: Self-pay | Source: Home / Self Care | Attending: Obstetrics and Gynecology

## 2022-05-09 ENCOUNTER — Inpatient Hospital Stay (HOSPITAL_COMMUNITY)
Admission: AD | Admit: 2022-05-09 | Discharge: 2022-05-12 | DRG: 742 | Disposition: A | Discharge status: Home / Self Care | Payer: Commercial Managed Care - HMO | Attending: Obstetrics and Gynecology | Admitting: Obstetrics and Gynecology

## 2022-05-09 ENCOUNTER — Encounter (HOSPITAL_COMMUNITY): Payer: Self-pay | Admitting: Obstetrics and Gynecology

## 2022-05-09 DIAGNOSIS — I10 Essential (primary) hypertension: Secondary | ICD-10-CM | POA: Diagnosis present

## 2022-05-09 DIAGNOSIS — Z8249 Family history of ischemic heart disease and other diseases of the circulatory system: Secondary | ICD-10-CM | POA: Diagnosis not present

## 2022-05-09 DIAGNOSIS — D259 Leiomyoma of uterus, unspecified: Secondary | ICD-10-CM | POA: Diagnosis not present

## 2022-05-09 DIAGNOSIS — D219 Benign neoplasm of connective and other soft tissue, unspecified: Secondary | ICD-10-CM

## 2022-05-09 DIAGNOSIS — Z8673 Personal history of transient ischemic attack (TIA), and cerebral infarction without residual deficits: Secondary | ICD-10-CM | POA: Diagnosis not present

## 2022-05-09 DIAGNOSIS — Z79899 Other long term (current) drug therapy: Secondary | ICD-10-CM

## 2022-05-09 DIAGNOSIS — Z87891 Personal history of nicotine dependence: Secondary | ICD-10-CM | POA: Diagnosis not present

## 2022-05-09 DIAGNOSIS — D251 Intramural leiomyoma of uterus: Secondary | ICD-10-CM

## 2022-05-09 DIAGNOSIS — N920 Excessive and frequent menstruation with regular cycle: Secondary | ICD-10-CM

## 2022-05-09 DIAGNOSIS — Z885 Allergy status to narcotic agent status: Secondary | ICD-10-CM

## 2022-05-09 DIAGNOSIS — D509 Iron deficiency anemia, unspecified: Principal | ICD-10-CM

## 2022-05-09 DIAGNOSIS — Z87820 Personal history of traumatic brain injury: Secondary | ICD-10-CM

## 2022-05-09 DIAGNOSIS — D62 Acute posthemorrhagic anemia: Secondary | ICD-10-CM | POA: Diagnosis not present

## 2022-05-09 DIAGNOSIS — D250 Submucous leiomyoma of uterus: Secondary | ICD-10-CM | POA: Diagnosis present

## 2022-05-09 DIAGNOSIS — D630 Anemia in neoplastic disease: Secondary | ICD-10-CM | POA: Diagnosis not present

## 2022-05-09 DIAGNOSIS — F419 Anxiety disorder, unspecified: Secondary | ICD-10-CM | POA: Diagnosis present

## 2022-05-09 DIAGNOSIS — D649 Anemia, unspecified: Secondary | ICD-10-CM

## 2022-05-09 DIAGNOSIS — N939 Abnormal uterine and vaginal bleeding, unspecified: Secondary | ICD-10-CM | POA: Diagnosis present

## 2022-05-09 DIAGNOSIS — Z90710 Acquired absence of both cervix and uterus: Secondary | ICD-10-CM | POA: Diagnosis present

## 2022-05-09 DIAGNOSIS — Z7982 Long term (current) use of aspirin: Secondary | ICD-10-CM

## 2022-05-09 DIAGNOSIS — Z91041 Radiographic dye allergy status: Secondary | ICD-10-CM

## 2022-05-09 DIAGNOSIS — Z01818 Encounter for other preprocedural examination: Secondary | ICD-10-CM

## 2022-05-09 DIAGNOSIS — Z8616 Personal history of COVID-19: Secondary | ICD-10-CM

## 2022-05-09 DIAGNOSIS — E782 Mixed hyperlipidemia: Secondary | ICD-10-CM | POA: Diagnosis present

## 2022-05-09 DIAGNOSIS — N938 Other specified abnormal uterine and vaginal bleeding: Secondary | ICD-10-CM

## 2022-05-09 HISTORY — PX: LAPAROSCOPY: SHX197

## 2022-05-09 HISTORY — PX: HYSTERECTOMY ABDOMINAL WITH SALPINGECTOMY: SHX6725

## 2022-05-09 LAB — POCT I-STAT, CHEM 8
BUN: 8 mg/dL (ref 6–20)
BUN: 6 mg/dL (ref 6–20)
BUN: 5 mg/dL — ABNORMAL LOW (ref 6–20)
Calcium, Ion: 1.31 mmol/L (ref 1.15–1.40)
Calcium, Ion: 1.21 mmol/L (ref 1.15–1.40)
Calcium, Ion: 1.21 mmol/L (ref 1.15–1.40)
Chloride: 104 mmol/L (ref 98–111)
Chloride: 105 mmol/L (ref 98–111)
Chloride: 104 mmol/L (ref 98–111)
Creatinine, Ser: 0.5 mg/dL (ref 0.44–1.00)
Creatinine, Ser: 0.4 mg/dL — ABNORMAL LOW (ref 0.44–1.00)
Creatinine, Ser: 0.5 mg/dL (ref 0.44–1.00)
Glucose, Bld: 113 mg/dL — ABNORMAL HIGH (ref 70–99)
Glucose, Bld: 175 mg/dL — ABNORMAL HIGH (ref 70–99)
Glucose, Bld: 158 mg/dL — ABNORMAL HIGH (ref 70–99)
HCT: 26 % — ABNORMAL LOW (ref 36.0–46.0)
HCT: 25 % — ABNORMAL LOW (ref 36.0–46.0)
HCT: 24 % — ABNORMAL LOW (ref 36.0–46.0)
Hemoglobin: 8.8 g/dL — ABNORMAL LOW (ref 12.0–15.0)
Hemoglobin: 8.5 g/dL — ABNORMAL LOW (ref 12.0–15.0)
Hemoglobin: 8.2 g/dL — ABNORMAL LOW (ref 12.0–15.0)
Potassium: 3.8 mmol/L (ref 3.5–5.1)
Potassium: 3.5 mmol/L (ref 3.5–5.1)
Potassium: 4.1 mmol/L (ref 3.5–5.1)
Sodium: 141 mmol/L (ref 135–145)
Sodium: 139 mmol/L (ref 135–145)
Sodium: 137 mmol/L (ref 135–145)
TCO2: 24 mmol/L (ref 22–32)
TCO2: 22 mmol/L (ref 22–32)
TCO2: 23 mmol/L (ref 22–32)

## 2022-05-09 LAB — CBC
HCT: 28.5 % — ABNORMAL LOW (ref 36.0–46.0)
Hemoglobin: 8.5 g/dL — ABNORMAL LOW (ref 12.0–15.0)
MCH: 22.5 pg — ABNORMAL LOW (ref 26.0–34.0)
MCHC: 29.8 g/dL — ABNORMAL LOW (ref 30.0–36.0)
MCV: 75.6 fL — ABNORMAL LOW (ref 80.0–100.0)
Platelets: 240 10*3/uL (ref 150–400)
RBC: 3.77 MIL/uL — ABNORMAL LOW (ref 3.87–5.11)
RDW: 15.8 % — ABNORMAL HIGH (ref 11.5–15.5)
WBC: 12.1 10*3/uL — ABNORMAL HIGH (ref 4.0–10.5)
nRBC: 0 % (ref 0.0–0.2)

## 2022-05-09 LAB — ABO/RH: ABO/RH(D): A POS

## 2022-05-09 LAB — PREPARE RBC (CROSSMATCH)

## 2022-05-09 LAB — POCT PREGNANCY, URINE: Preg Test, Ur: NEGATIVE

## 2022-05-09 SURGERY — HYSTERECTOMY, TOTAL, ABDOMINAL, WITH SALPINGECTOMY
Anesthesia: General | Site: Abdomen

## 2022-05-09 MED ORDER — OXYCODONE HCL 5 MG/5ML PO SOLN: 5.0000 mg | Freq: Once | ORAL | Status: DC | PRN

## 2022-05-09 MED ORDER — KETAMINE HCL 10 MG/ML IJ SOLN
INTRAMUSCULAR | Status: DC | PRN
  Administered 2022-05-09: 20 mg via INTRAVENOUS

## 2022-05-09 MED ORDER — CEFAZOLIN SODIUM-DEXTROSE 2-4 GM/100ML-% IV SOLN
2.0000 g | INTRAVENOUS | Status: AC
  Administered 2022-05-09: 2 g via INTRAVENOUS
  Filled 2022-05-09: qty 100

## 2022-05-09 MED ORDER — SODIUM CHLORIDE 0.9 % IR SOLN
Status: DC | PRN
  Administered 2022-05-09: 1

## 2022-05-09 MED ORDER — SIMETHICONE 80 MG PO CHEW
80.0000 mg | CHEWABLE_TABLET | Freq: Three times a day (TID) | ORAL | Status: DC
  Administered 2022-05-09 – 2022-05-12 (×8): 80 mg via ORAL
  Filled 2022-05-09 (×9): qty 1

## 2022-05-09 MED ORDER — FENTANYL CITRATE (PF) 250 MCG/5ML IJ SOLN
INTRAMUSCULAR | Status: DC | PRN
  Administered 2022-05-09 (×2): 50 ug via INTRAVENOUS
  Administered 2022-05-09: 100 ug via INTRAVENOUS
  Administered 2022-05-09: 50 ug via INTRAVENOUS

## 2022-05-09 MED ORDER — HYDROMORPHONE HCL 1 MG/ML IJ SOLN
INTRAMUSCULAR | Status: AC
  Filled 2022-05-09: qty 0.5

## 2022-05-09 MED ORDER — HYDROMORPHONE 1 MG/ML IV SOLN
INTRAVENOUS | Status: DC
  Administered 2022-05-09: 1.4 mg via INTRAVENOUS
  Administered 2022-05-09: 2.4 mg via INTRAVENOUS
  Administered 2022-05-10 (×2): 0.9 mg via INTRAVENOUS
  Filled 2022-05-09: qty 30

## 2022-05-09 MED ORDER — DIPHENHYDRAMINE HCL 50 MG/ML IJ SOLN: 12.5000 mg | Freq: Four times a day (QID) | INTRAMUSCULAR | Status: DC | PRN

## 2022-05-09 MED ORDER — PHENYLEPHRINE HCL-NACL 20-0.9 MG/250ML-% IV SOLN
INTRAVENOUS | Status: DC | PRN
  Administered 2022-05-09: 45 ug/min via INTRAVENOUS

## 2022-05-09 MED ORDER — LACTATED RINGERS IV SOLN: INTRAVENOUS | Status: DC

## 2022-05-09 MED ORDER — SODIUM CHLORIDE 0.9 % IV SOLN: INTRAVENOUS | Status: DC

## 2022-05-09 MED ORDER — MEPERIDINE HCL 25 MG/ML IJ SOLN: 6.2500 mg | INTRAMUSCULAR | Status: DC | PRN

## 2022-05-09 MED ORDER — DEXAMETHASONE SODIUM PHOSPHATE 10 MG/ML IJ SOLN
INTRAMUSCULAR | Status: DC | PRN
  Administered 2022-05-09: 10 mg via INTRAVENOUS

## 2022-05-09 MED ORDER — PANTOPRAZOLE SODIUM 40 MG IV SOLR
40.0000 mg | Freq: Every day | INTRAVENOUS | Status: DC
  Administered 2022-05-09 – 2022-05-10 (×2): 40 mg via INTRAVENOUS
  Filled 2022-05-09 (×2): qty 10

## 2022-05-09 MED ORDER — SODIUM CHLORIDE 0.9% FLUSH: 9.0000 mL | INTRAVENOUS | Status: DC | PRN

## 2022-05-09 MED ORDER — MIDAZOLAM HCL 2 MG/2ML IJ SOLN
INTRAMUSCULAR | Status: DC | PRN
  Administered 2022-05-09: 2 mg via INTRAVENOUS

## 2022-05-09 MED ORDER — HYDROMORPHONE HCL 1 MG/ML IJ SOLN
0.2500 mg | INTRAMUSCULAR | Status: DC | PRN
  Administered 2022-05-09: 0.5 mg via INTRAVENOUS

## 2022-05-09 MED ORDER — ACETAMINOPHEN 10 MG/ML IV SOLN
INTRAVENOUS | Status: AC
  Filled 2022-05-09: qty 100

## 2022-05-09 MED ORDER — POLYETHYLENE GLYCOL 3350 17 G PO PACK
17.0000 g | PACK | Freq: Every day | ORAL | Status: DC
  Administered 2022-05-10 – 2022-05-12 (×3): 17 g via ORAL
  Filled 2022-05-09 (×3): qty 1

## 2022-05-09 MED ORDER — ORAL CARE MOUTH RINSE: 15.0000 mL | Freq: Once | OROMUCOSAL | Status: AC

## 2022-05-09 MED ORDER — LABETALOL HCL 5 MG/ML IV SOLN
INTRAVENOUS | Status: DC | PRN
  Administered 2022-05-09: 5 mg via INTRAVENOUS

## 2022-05-09 MED ORDER — ONDANSETRON HCL 4 MG/2ML IJ SOLN
4.0000 mg | Freq: Four times a day (QID) | INTRAMUSCULAR | Status: DC | PRN
  Administered 2022-05-10 (×2): 4 mg via INTRAVENOUS
  Filled 2022-05-09: qty 2

## 2022-05-09 MED ORDER — DILTIAZEM HCL ER COATED BEADS 180 MG PO CP24
180.0000 mg | ORAL_CAPSULE | Freq: Every day | ORAL | Status: DC
  Administered 2022-05-10 – 2022-05-12 (×3): 180 mg via ORAL
  Filled 2022-05-09 (×3): qty 1

## 2022-05-09 MED ORDER — ASPIRIN 81 MG PO TBEC
81.0000 mg | DELAYED_RELEASE_TABLET | Freq: Every morning | ORAL | Status: DC
  Administered 2022-05-10 – 2022-05-12 (×3): 81 mg via ORAL
  Filled 2022-05-09 (×3): qty 1

## 2022-05-09 MED ORDER — MIDAZOLAM HCL 2 MG/2ML IJ SOLN
INTRAMUSCULAR | Status: AC
  Filled 2022-05-09: qty 2

## 2022-05-09 MED ORDER — ONDANSETRON HCL 4 MG/2ML IJ SOLN
INTRAMUSCULAR | Status: DC | PRN
  Administered 2022-05-09: 4 mg via INTRAVENOUS

## 2022-05-09 MED ORDER — ENOXAPARIN SODIUM 40 MG/0.4ML IJ SOSY
40.0000 mg | PREFILLED_SYRINGE | INTRAMUSCULAR | Status: DC
  Administered 2022-05-09 – 2022-05-11 (×3): 40 mg via SUBCUTANEOUS
  Filled 2022-05-09 (×3): qty 0.4

## 2022-05-09 MED ORDER — ROCURONIUM BROMIDE 10 MG/ML (PF) SYRINGE
PREFILLED_SYRINGE | INTRAVENOUS | Status: DC | PRN
  Administered 2022-05-09 (×2): 10 mg via INTRAVENOUS
  Administered 2022-05-09: 100 mg via INTRAVENOUS
  Administered 2022-05-09: 20 mg via INTRAVENOUS

## 2022-05-09 MED ORDER — CHLORHEXIDINE GLUCONATE 0.12 % MT SOLN
15.0000 mL | Freq: Once | OROMUCOSAL | Status: AC
  Administered 2022-05-09: 15 mL via OROMUCOSAL
  Filled 2022-05-09: qty 15

## 2022-05-09 MED ORDER — PROPOFOL 10 MG/ML IV BOLUS
INTRAVENOUS | Status: AC
  Filled 2022-05-09: qty 20

## 2022-05-09 MED ORDER — BUPIVACAINE HCL 0.5 % IJ SOLN
INTRAMUSCULAR | Status: DC | PRN
  Administered 2022-05-09: 10 mL

## 2022-05-09 MED ORDER — ONDANSETRON HCL 4 MG PO TABS
4.0000 mg | ORAL_TABLET | Freq: Four times a day (QID) | ORAL | Status: DC | PRN
  Administered 2022-05-10: 4 mg via ORAL
  Filled 2022-05-09 (×2): qty 1

## 2022-05-09 MED ORDER — KETAMINE HCL 50 MG/5ML IJ SOSY
PREFILLED_SYRINGE | INTRAMUSCULAR | Status: AC
  Filled 2022-05-09: qty 5

## 2022-05-09 MED ORDER — KETOROLAC TROMETHAMINE 30 MG/ML IJ SOLN
30.0000 mg | Freq: Once | INTRAMUSCULAR | Status: AC | PRN
  Administered 2022-05-09: 30 mg via INTRAVENOUS

## 2022-05-09 MED ORDER — PROPOFOL 10 MG/ML IV BOLUS
INTRAVENOUS | Status: DC | PRN
  Administered 2022-05-09: 200 mg via INTRAVENOUS

## 2022-05-09 MED ORDER — ONDANSETRON HCL 4 MG/2ML IJ SOLN
4.0000 mg | Freq: Four times a day (QID) | INTRAMUSCULAR | Status: DC | PRN
  Administered 2022-05-09: 4 mg via INTRAVENOUS
  Filled 2022-05-09 (×2): qty 2

## 2022-05-09 MED ORDER — SUGAMMADEX SODIUM 200 MG/2ML IV SOLN
INTRAVENOUS | Status: DC | PRN
  Administered 2022-05-09: 100 mg via INTRAVENOUS
  Administered 2022-05-09: 200 mg via INTRAVENOUS

## 2022-05-09 MED ORDER — AMISULPRIDE (ANTIEMETIC) 5 MG/2ML IV SOLN: 10.0000 mg | Freq: Once | INTRAVENOUS | Status: DC | PRN

## 2022-05-09 MED ORDER — ACETAMINOPHEN 10 MG/ML IV SOLN
INTRAVENOUS | Status: DC | PRN
  Administered 2022-05-09: 1000 mg via INTRAVENOUS

## 2022-05-09 MED ORDER — OXYCODONE HCL 5 MG PO TABS: 5.0000 mg | ORAL_TABLET | Freq: Once | ORAL | Status: DC | PRN

## 2022-05-09 MED ORDER — ONDANSETRON HCL 4 MG/2ML IJ SOLN: 4.0000 mg | Freq: Once | INTRAMUSCULAR | Status: DC | PRN

## 2022-05-09 MED ORDER — HYDROMORPHONE HCL 1 MG/ML IJ SOLN
INTRAMUSCULAR | Status: DC | PRN
  Administered 2022-05-09 (×2): .5 mg via INTRAVENOUS

## 2022-05-09 MED ORDER — GABAPENTIN 100 MG PO CAPS
200.0000 mg | ORAL_CAPSULE | Freq: Two times a day (BID) | ORAL | Status: DC
  Administered 2022-05-09 – 2022-05-12 (×7): 200 mg via ORAL
  Filled 2022-05-09 (×7): qty 2

## 2022-05-09 MED ORDER — KETOROLAC TROMETHAMINE 30 MG/ML IJ SOLN
INTRAMUSCULAR | Status: AC
  Filled 2022-05-09: qty 1

## 2022-05-09 MED ORDER — ATORVASTATIN CALCIUM 40 MG PO TABS
40.0000 mg | ORAL_TABLET | Freq: Every day | ORAL | Status: DC
  Administered 2022-05-10 – 2022-05-12 (×3): 40 mg via ORAL
  Filled 2022-05-09 (×3): qty 1

## 2022-05-09 MED ORDER — ACETAMINOPHEN 325 MG PO TABS
650.0000 mg | ORAL_TABLET | Freq: Four times a day (QID) | ORAL | Status: DC
  Administered 2022-05-09 – 2022-05-12 (×12): 650 mg via ORAL
  Filled 2022-05-09 (×12): qty 2

## 2022-05-09 MED ORDER — LIDOCAINE 2% (20 MG/ML) 5 ML SYRINGE
INTRAMUSCULAR | Status: DC | PRN
  Administered 2022-05-09: 60 mg via INTRAVENOUS

## 2022-05-09 MED ORDER — NALOXONE HCL 0.4 MG/ML IJ SOLN
0.4000 mg | INTRAMUSCULAR | Status: DC | PRN
  Filled 2022-05-09: qty 1

## 2022-05-09 MED ORDER — FENTANYL CITRATE (PF) 250 MCG/5ML IJ SOLN
INTRAMUSCULAR | Status: AC
  Filled 2022-05-09: qty 5

## 2022-05-09 MED ORDER — DIPHENHYDRAMINE HCL 12.5 MG/5ML PO ELIX: 12.5000 mg | ORAL_SOLUTION | Freq: Four times a day (QID) | ORAL | Status: DC | PRN

## 2022-05-09 MED ORDER — SODIUM CHLORIDE 0.9 % IV SOLN
INTRAVENOUS | Status: DC
  Administered 2022-05-09: 1000 mL via INTRAVENOUS

## 2022-05-09 SURGICAL SUPPLY — 59 items
BAG COUNTER SPONGE SURGICOUNT (BAG) ×4 IMPLANT
BENZOIN TINCTURE AMPULE (MISCELLANEOUS) ×2 IMPLANT
BENZOIN TINCTURE PRP APPL 2/3 (GAUZE/BANDAGES/DRESSINGS) ×2 IMPLANT
BLADE SURG 15 STRL LF DISP TIS (BLADE) ×4 IMPLANT
BLADE SURG 15 STRL SS (BLADE) ×4
CLSR STERI-STRIP ANTIMIC 1/2X4 (GAUZE/BANDAGES/DRESSINGS) ×2 IMPLANT
DEFOGGER SCOPE WARMER CLEARIFY (MISCELLANEOUS) ×4 IMPLANT
DERMABOND ADVANCED .7 DNX12 (GAUZE/BANDAGES/DRESSINGS) ×6 IMPLANT
DRSG OPSITE POSTOP 4X10 (GAUZE/BANDAGES/DRESSINGS) ×2 IMPLANT
DRSG OPSITE POSTOP 4X6 (GAUZE/BANDAGES/DRESSINGS) ×2 IMPLANT
DURAPREP 26ML APPLICATOR (WOUND CARE) ×4 IMPLANT
ELECT BLADE 6.5 EXT (BLADE) ×2 IMPLANT
GAUZE 4X4 16PLY ~~LOC~~+RFID DBL (SPONGE) ×8 IMPLANT
GLOVE BIO SURGEON STRL SZ7 (GLOVE) ×4 IMPLANT
GLOVE BIOGEL PI IND STRL 7.5 (GLOVE) ×8 IMPLANT
GLOVE SURG SS PI 7.0 STRL IVOR (GLOVE) ×16 IMPLANT
GOWN STRL REUS W/ TWL LRG LVL3 (GOWN DISPOSABLE) ×8 IMPLANT
GOWN STRL REUS W/ TWL XL LVL3 (GOWN DISPOSABLE) ×8 IMPLANT
GOWN STRL REUS W/TWL LRG LVL3 (GOWN DISPOSABLE) ×8
GOWN STRL REUS W/TWL XL LVL3 (GOWN DISPOSABLE) ×8
HEMOSTAT ARISTA ABSORB 3G PWDR (HEMOSTASIS) ×2 IMPLANT
HIBICLENS CHG 4% 4OZ BTL (MISCELLANEOUS) ×6 IMPLANT
IRRIG SUCT STRYKERFLOW 2 WTIP (MISCELLANEOUS) ×4
IRRIGATION SUCT STRKRFLW 2 WTP (MISCELLANEOUS) ×4 IMPLANT
KIT TURNOVER KIT B (KITS) ×4 IMPLANT
L-HOOK LAP DISP 36CM (ELECTROSURGICAL) ×4
LHOOK LAP DISP 36CM (ELECTROSURGICAL) ×2 IMPLANT
MANIFOLD NEPTUNE II (INSTRUMENTS) ×4 IMPLANT
MANIPULATOR VCARE LG CRV RETR (MISCELLANEOUS) ×2 IMPLANT
OCCLUDER COLPOPNEUMO (BALLOONS) ×4 IMPLANT
PACK ABDOMINAL GYN (CUSTOM PROCEDURE TRAY) ×2 IMPLANT
PACK TRENDGUARD 450 HYBRID PRO (MISCELLANEOUS) ×4 IMPLANT
PAD OB MATERNITY 4.3X12.25 (PERSONAL CARE ITEMS) ×4 IMPLANT
POUCH LAPAROSCOPIC INSTRUMENT (MISCELLANEOUS) ×4 IMPLANT
RTRCTR C-SECT PINK 25CM LRG (MISCELLANEOUS) ×2 IMPLANT
SET IRRIG Y TYPE TUR BLADDER L (SET/KITS/TRAYS/PACK) ×4 IMPLANT
SET TUBE SMOKE EVAC HIGH FLOW (TUBING) ×4 IMPLANT
STRIP CLOSURE SKIN 1/2X4 (GAUZE/BANDAGES/DRESSINGS) ×2 IMPLANT
SUT MNCRL AB 4-0 PS2 18 (SUTURE) ×2 IMPLANT
SUT PLAIN 2 0 XLH (SUTURE) ×2 IMPLANT
SUT VIC AB 0 CT1 27 (SUTURE) ×8
SUT VIC AB 0 CT1 27XCR 8 STRN (SUTURE) ×4 IMPLANT
SUT VIC AB 0 CT1 36 (SUTURE) ×2 IMPLANT
SUT VIC AB 1 CT1 18XBRD ANBCTR (SUTURE) ×4 IMPLANT
SUT VIC AB 1 CT1 27 (SUTURE) ×8
SUT VIC AB 1 CT1 27XBRD ANTBC (SUTURE) ×4 IMPLANT
SUT VIC AB 1 CT1 8-18 (SUTURE) ×8
SUT VIC AB 3-0 CT1 27 (SUTURE) ×4
SUT VIC AB 3-0 CT1 TAPERPNT 27 (SUTURE) ×2 IMPLANT
SUT VIC AB 3-0 SH 27 (SUTURE) ×12
SUT VIC AB 3-0 SH 27X BRD (SUTURE) ×6 IMPLANT
SUT VICRYL 1 TIES 12X18 (SUTURE) ×2 IMPLANT
SYR 10ML LL (SYRINGE) ×4 IMPLANT
SYR 50ML LL SCALE MARK (SYRINGE) ×4 IMPLANT
TOWEL GREEN STERILE FF (TOWEL DISPOSABLE) ×4 IMPLANT
TRAY FOLEY W/BAG SLVR 14FR (SET/KITS/TRAYS/PACK) ×4 IMPLANT
TRENDGUARD 450 HYBRID PRO PACK (MISCELLANEOUS) ×4
TROCAR ADV FIXATION 5X100MM (TROCAR) ×4 IMPLANT
UNDERPAD 30X36 HEAVY ABSORB (UNDERPADS AND DIAPERS) ×4 IMPLANT

## 2022-05-09 NOTE — Transfer of Care (Signed)
Immediate Anesthesia Transfer of Care Note  Patient: JAQUISHA FRECH  Procedure(s) Performed: HYSTERECTOMY ABDOMINAL WITH BILATERAL SALPINGECTOMY (Bilateral: Abdomen) LAPAROSCOPY DIAGNOSTIC (Abdomen)  Patient Location: PACU  Anesthesia Type:General  Level of Consciousness: drowsy and patient cooperative  Airway & Oxygen Therapy: Patient Spontanous Breathing and Patient connected to nasal cannula oxygen  Post-op Assessment: Report given to RN, Post -op Vital signs reviewed and stable and Patient moving all extremities X 4  Post vital signs: Reviewed and stable  Last Vitals:  Vitals Value Taken Time  BP    Temp    Pulse 75 05/09/22 1227  Resp 16 05/09/22 1227  SpO2 100 % 05/09/22 1227  Vitals shown include unvalidated device data.  Last Pain:  Vitals:   05/09/22 0722  TempSrc:   PainSc: 0-No pain         Complications: No notable events documented.

## 2022-05-09 NOTE — Brief Op Note (Signed)
05/09/2022  12:05 PM  PATIENT:  Angela Hahn  46 y.o. female  PRE-OPERATIVE DIAGNOSIS:  Menorrhagia Fibroids  POST-OPERATIVE DIAGNOSIS:  Menorrhagia Fibroids  PROCEDURE:  Procedure(s): HYSTERECTOMY ABDOMINAL WITH BILATERAL SALPINGECTOMY (Bilateral) LAPAROSCOPY DIAGNOSTIC (N/A)  SURGEON:  Surgeon(s) and Role:    * Aletha Halim, MD - Primary  ASSISTANTS:    Griffin Basil, MD - Assisting   ANESTHESIA:   local and general  EBL:  350 mL   IVF: 1689m crystalloid  BLOOD ADMINISTERED:none  DRAINS: indwelling foley uop 6082m LOCAL MEDICATIONS USED:  MARCAINE     SPECIMEN:  cervix, uterus, both tubes  DISPOSITION OF SPECIMEN:  PATHOLOGY  COUNTS:  YES  TOURNIQUET:  * No tourniquets in log *  DICTATION: .Note written in paper chart  PLAN OF CARE: Admit to inpatient   PATIENT DISPOSITION:  PACU - hemodynamically stable.   Delay start of Pharmacological VTE agent (>24hrs) due to surgical blood loss or risk of bleeding: yes  ChDurene RomansD Attending [facility] [employer] [facility]) 05/09/2022 Time: 1205pm

## 2022-05-09 NOTE — Op Note (Signed)
Operative Note   05/09/2022  PRE-OP DIAGNOSIS *Menorrhagia *Fibroids  *Anemia   POST-OP DIAGNOSIS *Same   SURGEON: Aletha Halim, MD - Primary  ASSISTANT:    Griffin Basil, MD - Assisting  An experienced assistant was required given the standard of surgical care given the complexity of the case.  This assistant was needed for exposure, dissection, suctioning, retraction, instrument exchange, and for overall help during the procedure.  PROCEDURE: Diagnostic laparoscopy conversion to total abdominal hysterectomy, bilateral salpingectomy  ANESTHESIA: General and local  ESTIMATED BLOOD LOSS:  313m  DRAINS: indwelling foley 6025mUOP  TOTAL IV FLUIDS: 160039mrystalloid  VTE PROPHYLAXIS: SCDs to the bilateral lower extremities  ANTIBIOTICS: Two grams of Cefazolin were given. within 1 hour of skin incision  SPECIMENS: cervix, uterus, both fallopian tubes  DISPOSITION: PACU - hemodynamically stable.  CONDITION: stable  COMPLICATIONS: conversion to open due to large fibroid uterus. Incorrect Raytec sponge count, but the sponge was eventually found in the OR trash bin, which caused prolongation of the case until the sponge was found, approximately 30 minutes.   FINDINGS: Normal EGBUS, vagina and cervix. On bimanual exam, large fibroid uterus, 16 week sized, mobile. No lower uterine segment fibroids palpated on rectovaginal exam.  On laparoscopy, normal stomach and liver edge. Large fibroid uterus (intramural) noted and unable to see anteriorly or on either pelvic sidewall due to size and location of fibroids. Grossly normal tubes and ovaries.   DESCRIPTION OF PROCEDURE: After informed consent was obtained, the patient was prepped and draped in the usual sterile manner for an abdominal procedure and placed in the dorsal lithotomy position.  A timeout was done, so a foley placed and the exam under anesthesia done with the above findings. A large v-care uterine manipulator  was placed and gloves and gown changed.  A gastric decompression tube confirmed with anesthesia. After injection of local anesthesia, a 5mm20mcision was [facility] and a 5mm 85m trocar was placed under direct visualization and the abdomen then insufflated.  On laparoscopy, the entry site was inspected for any injuries and the above findings noted. A 5mm p66m was placed in the RLQ and LLQ under direct visualization and the patient placed in Trendelenburg with the above noted findings. Given this, the decision was made to convert to open.    A Modified maylard incision was made into the abdomen with approximately 1.5cm of medial muscle transected on both rectus muscle sides with the bovie.  Once inside the abdominal cavity, moist lap sponges were used to displace the bowel.  The round ligaments on either side were identified and individually dissected and ligated with #0 Vicryl suture and divided. This allowed us to [place] create a bladder flap by both blunt and sharp dissection. The bilateral fallopian tubes were doubly clamped with Kellys in the mesosalpinx and the tubes removed. A 0-vicryl free tie and then suture ligation was then done on the pedicle. The fallopian tube and ovarian ligament were isolated through the broad ligament from the uterine body and ligated with #0 Vicryl suture and divided as well. We then skeletonized the uterine vessels on either side and carefully dissected the bladder flap anteriorly. Posteriorly, the peritoneum was dissected down toward the uterosacral ligaments. Heaney clamps were then placed at each isthmic portion of the cervical body junction where the uterine arteries adjoined the uterus. These were clamped, ligated and divided using #0 Vicryl suture. The fibroid uterus was then cut from the  cervix with the bovie and removed from the operative field and the cervix grasped with Kocher clamps. The remainder of the cervix  was then removed by the  clamp-cut-ligation technique using #0 Vicryl on all major pedicles. The vagina was then clamped on both side with Heaney clamps and the cervix cut and removed with scissors. With removal of the cervix, the vaginal cuff was closed with interrupted sutures of 0-vicryl figure of eight sutures.  Hemostasis was then inspected and secured throughout the entire area, and arista was placed on the vaginal cuff and pedicle areas after irrigation with warm fluid; the ovaries were left in situ. Any remaining lap sponges were then removed. The sponge count was correct times 2 at this time. The Foley catheter was inspected and clear urine was noted. Having removed all instruments and packs, we then began closure of the abdomen with hemostatic muscles noted. The fascia was closed with #0 Vicryl in a running fashion bilaterally and the subcutaneous tissue was also closed with #2-0 plain gut in a running continuous manner and the skin was closed with 4-0 monocryl. Hemostasis was secured throughout the entire layers. Instrument counts were correct x 2 and the patient was taken to the recovery room in good condition and breathing independently.  Durene Romans MD Attending Center for [employer])

## 2022-05-09 NOTE — Progress Notes (Signed)
Gynecology Progress Note  Admission Date: 05/09/2022 Current Date: 05/09/2022 9:10 PM  Angela Hahn is a 46 y.o. G3P3003 POD#0 s/p diagnostic laparoscopy converted to TAH/BS (EBL 324m) due to large fibroid uterus.    History complicated by: Patient Active Problem List   Diagnosis Date Noted   Abnormal uterine bleeding (AUB) 05/09/2022   Status post hysterectomy 05/09/2022   Fibroid 03/18/2022   Dysmenorrhea 03/07/2022   Iron deficiency anemia 01/26/2022   HTN (hypertension) 09/05/2021   Anxiety disorder 07/07/2021   Mixed hyperlipidemia 07/04/2021   CVA (cerebral vascular accident) (HDonald 06/23/2021   Menorrhagia with regular cycle 10/27/2020   Pain of right thumb 06/04/2018   Chronic low back pain 06/18/2012    ROS and patient/family/surgical history, located on admission H&P note dated 05/09/2022, have been reviewed, and there are no changes except as noted below Yesterday/Overnight Events:  N/a  Subjective:  No ambulation yet. Tried some PO clears but felt nauseous. Pain controlled with PCA. No chest pain, sob  Objective:    Current Vital Signs 24h Vital Sign Ranges  T 97.8 F (36.6 C) Temp  Avg: 97.6 F (36.4 C)  Min: 97 F (36.1 C)  Max: 98.2 F (36.8 C)  BP (!) 145/75 BP  Min: 127/63  Max: 160/74  HR 63 Pulse  Avg: 71  Min: 63  Max: 76  RR 17 Resp  Avg: 16.3  Min: 13  Max: 22  SaO2 100 % Nasal Cannula SpO2  Avg: 99 %  Min: 95 %  Max: 100 %       24 Hour I/O Current Shift I/O  Time Ins Outs No intake/output data recorded. No intake/output data recorded.   Patient Vitals for the past 12 hrs:  BP Temp Temp src Pulse Resp SpO2  05/09/22 2000 -- -- -- -- 17 100 %  05/09/22 1950 (!) 145/75 97.8 F (36.6 C) -- 63 18 100 %  05/09/22 1729 (!) 135/92 98.2 F (36.8 C) Oral 69 17 100 %  05/09/22 1446 (!) 145/81 97.7 F (36.5 C) Oral 65 17 100 %  05/09/22 1345 (!) 156/79 -- -- 70 16 95 %  05/09/22 1330 (!) 160/74 (!) 97.5 F (36.4 C) -- 72 13 96 %  05/09/22 1315  (!) 146/83 -- -- 70 13 97 %  05/09/22 1255 (!) 150/85 -- -- 73 15 100 %  05/09/22 1253 -- -- -- -- (!) 22 100 %  05/09/22 1240 (!) 144/80 -- -- 76 14 100 %  05/09/22 1225 (!) 147/86 (!) 97 F (36.1 C) -- 76 15 100 %     Physical exam: General appearance: alert, cooperative, and appears stated age Abdomen: soft, non-tender; bowel sounds normal; no masses,  no organomegaly and rare BS, mildly distended, appropriately ttp. C/d/I honeycomb dressing GU: No gross VB. Clear UOP in foley, approximately 507min bag Lungs: clear to auscultation bilaterally Heart: S1, S2 normal, no murmur, rub or gallop, regular rate and rhythm Extremities: SCDs in place Skin: warm and dry Psych: appropriate Neurologic: Grossly normal  Medications Current Facility-Administered Medications  Medication Dose Route Frequency Provider Last Rate Last Admin   0.9 %  sodium chloride infusion   Intravenous Continuous PiAletha HalimMD 125 mL/hr at 05/09/22 1923 1,000 mL at 05/09/22 1923   acetaminophen (TYLENOL) tablet 650 mg  650 mg Oral Q6H PiAletha HalimMD   650 mg at 05/09/22 1732   [START ON 05/10/2022] aspirin EC tablet 81 mg  81 mg Oral q AM  Aletha Halim, MD       [START ON 05/10/2022] atorvastatin (LIPITOR) tablet 40 mg  40 mg Oral Daily Aletha Halim, MD       [START ON 05/10/2022] diltiazem (CARDIZEM CD) 24 hr capsule 180 mg  180 mg Oral Daily Aletha Halim, MD       diphenhydrAMINE (BENADRYL) injection 12.5 mg  12.5 mg Intravenous Q6H PRN Aletha Halim, MD       Or   diphenhydrAMINE (BENADRYL) 12.5 MG/5ML elixir 12.5 mg  12.5 mg Oral Q6H PRN Aletha Halim, MD       enoxaparin (LOVENOX) injection 40 mg  40 mg Subcutaneous Q24H Aletha Halim, MD   40 mg at 05/09/22 2107   gabapentin (NEURONTIN) capsule 200 mg  200 mg Oral BID Aletha Halim, MD   200 mg at 05/09/22 2106   HYDROmorphone (DILAUDID) 1 mg/mL PCA injection   Intravenous Q4H Aletha Halim, MD   2.4 mg at 05/09/22 2000    ketorolac (TORADOL) 30 MG/ML injection            naloxone (NARCAN) injection 0.4 mg  0.4 mg Intravenous PRN Aletha Halim, MD       And   sodium chloride flush (NS) 0.9 % injection 9 mL  9 mL Intravenous PRN Aletha Halim, MD       ondansetron (ZOFRAN) tablet 4 mg  4 mg Oral Q6H PRN Aletha Halim, MD       Or   ondansetron (ZOFRAN) injection 4 mg  4 mg Intravenous Q6H PRN Aletha Halim, MD       ondansetron (ZOFRAN) injection 4 mg  4 mg Intravenous Q6H PRN Aletha Halim, MD   4 mg at 05/09/22 1927   pantoprazole (PROTONIX) injection 40 mg  40 mg Intravenous QHS Aletha Halim, MD   40 mg at 05/09/22 2107   [START ON 05/10/2022] polyethylene glycol (MIRALAX / GLYCOLAX) packet 17 g  17 g Oral Daily Aletha Halim, MD       simethicone (MYLICON) chewable tablet 80 mg  80 mg Oral TID Aletha Halim, MD   80 mg at 05/09/22 2106      Labs  Recent Labs  Lab 05/09/22 0917 05/09/22 1055 05/09/22 1155  HGB 8.8* 8.5* 8.2*  HCT 26.0* 25.0* 24.0*    Recent Labs  Lab 05/09/22 0917 05/09/22 1055 05/09/22 1155  NA 141 139 137  K 3.8 3.5 4.1  CL 104 105 104  BUN 8 6 5*  CREATININE 0.50 0.40* 0.50  GLUCOSE 113* 175* 158*    Radiology none  Assessment & Plan:  Pt doing well *GYN: routine care. D/c foley and pca tomorrow. Continue on ADAT. Phlebotomy short staffed so still needs 1800 lab draw.  *PPx: lovenox to start tonight *Dispo: likely pod#2-3  Code Status: Full Code  Durene Romans MD Attending Center for [facility])

## 2022-05-09 NOTE — Anesthesia Postprocedure Evaluation (Signed)
Anesthesia Post Note  Patient: Angela Hahn  Procedure(s) Performed: HYSTERECTOMY ABDOMINAL WITH BILATERAL SALPINGECTOMY (Bilateral: Abdomen) LAPAROSCOPY DIAGNOSTIC (Abdomen)     Patient location during evaluation: PACU Anesthesia Type: General Level of consciousness: awake and alert, oriented and patient cooperative Pain management: pain level controlled Vital Signs Assessment: post-procedure vital signs reviewed and stable Respiratory status: spontaneous breathing, nonlabored ventilation and respiratory function stable Cardiovascular status: blood pressure returned to baseline and stable Postop Assessment: no apparent nausea or vomiting Anesthetic complications: no   No notable events documented.  Last Vitals:  Vitals:   05/09/22 1315 05/09/22 1330  BP: (!) 146/83 (!) 160/74  Pulse: 70 72  Resp: 13 13  Temp:    SpO2: 97% 96%    Last Pain:  Vitals:   05/09/22 1330  TempSrc:   PainSc: [place]

## 2022-05-09 NOTE — H&P (Signed)
Obstetrics & Gynecology Surgical H&P   Date of Surgery: 05/09/2022    Primary OBGYN: [facility]  Reason for Admission: scheduled hysterectomy  History of Present Illness: Angela Hahn is a 46 y.o. [phone] (Patient's last menstrual period was 04/26/2022.), with the above CC. PMHx is significant for h/o October 2022 CVA, anemia with need for transfusion, fibroids with AUB and pain, h/o BTL, BMI 30s.  Patient has long standing h/o heavy and painful periods that pre-date her stroke and was being controlled with Sprintec. Exact etiology of stroke unclear but felt to be caused or at least contributed by being on the Numa and/or phentermine; pt currently only on low dose ASA   She denies any intermenstrual bleeding and her periods come on about q3wks and can last for 7-14d and are heavy and painful; she confirms that she has never had a pelvic u/s before. Period just ending and started about a week ago.    She denies any menopausal s/s.    Patient seen by me for new patient visit mid July 2023 and she had a negative pap smear and embx and had an u/s ordered  U/s showed 9-10 submucosal fibroid (see below) and options d/w her and she elected for hysterectomy  Period just ended.   ROS: A 12-point review of systems was performed and negative, except as stated in the above HPI.  OBGYN History: As per HPI. OB History  Gravida Para Term Preterm AB Living  '3 3 3     3  '$ SAB IAB Ectopic Multiple Live Births               # Outcome Date GA Lbr Len/2nd Weight Sex Delivery Anes PTL Lv  3 Term           2 Term           1 Term             Obstetric Comments  SVD x 3.      Past Medical History: Past Medical History:  Diagnosis Date   Anemia    Anxiety    Arthritis    7-8 years ago patient was told she had degenerative arthritis in her back   Chronic headaches    Concussion with loss of consciousness 09/24/2015   COVID-19 virus infection 03/08/2021    Dysmenorrhea    Heart murmur    dx'd since childhood   Sleep apnea    Stroke [facility])     Past Surgical History: Past Surgical History:  Procedure Laterality Date   BUBBLE STUDY  07/14/2021   Procedure: BUBBLE STUDY;  Surgeon: Skeet Latch, MD;  Location: [place];  Service: Cardiovascular;;   TEE WITHOUT CARDIOVERSION N/A 07/14/2021   Procedure: TRANSESOPHAGEAL ECHOCARDIOGRAM (TEE);  Surgeon: Skeet Latch, MD;  Location: [facility] ENDOSCOPY;  Service: Cardiovascular;  Laterality: N/A;   TUBAL LIGATION      Family History:  Family History  Problem Relation Age of Onset   Arthritis Mother    Depression Mother    Hypertension Mother    Diabetes Mother    Hypertension Father    Diabetes Father     Social History:  Social History   Socioeconomic History   Marital status: Single    Spouse name: Not on file   Number of children: 3   Years of education: Not on file   Highest education level: Some college, no degree  Occupational History   Not on file  Tobacco Use  Smoking status: Former    Types: Cigarettes    Quit date: 2020    Years since quitting: 3.6   Smokeless tobacco: Never  Vaping Use   Vaping Use: Some days  Substance and Sexual Activity   Alcohol use: No    Alcohol/week: 0.0 standard drinks of alcohol   Drug use: No   Sexual activity: Yes    Partners: Male    Birth control/protection: Surgical    Comment: Tubal  Other Topics Concern   Not on file  Social History Narrative   Caffeine- might have 1 c coffee   Social Determinants of Radio broadcast assistant Strain: Not on file  Food Insecurity: Not on file  Transportation Needs: Not on file  Physical Activity: Not on file  Stress: Not on file  Social Connections: Not on file  Intimate Partner Violence: Not on file    Allergy: Allergies  Allergen Reactions   Gadolinium Derivatives Itching, Swelling and Cough   Hydrocodone Other (See Comments)    Itching, takes Benadryl with this  medicine   Tramadol Other (See Comments)    headache    Current Outpatient Medications: Medications Prior to Admission  Medication Sig Dispense Refill Last Dose   acetaminophen (TYLENOL) 500 MG tablet Take 1,000 mg by mouth every 6 (six) hours as needed for mild pain or headache.      aspirin EC 81 MG tablet Take 81 mg by mouth in the morning. Swallow whole.      atorvastatin (LIPITOR) 40 MG tablet TAKE 1 TABLET BY MOUTH DAILY 90 tablet 0    diltiazem (CARDIZEM CD) 180 MG 24 hr capsule Take 1 capsule (180 mg total) by mouth daily. 90 capsule 3    ferrous sulfate 325 (65 FE) MG tablet Take 1 tablet (325 mg total) by mouth 3 (three) times daily with meals. (Patient taking differently: Take 325 mg by mouth in the morning and at bedtime.) 100 tablet 0    ibuprofen (ADVIL) 800 MG tablet TAKE ONE TABLET BY MOUTH THREE TIMES A DAY (Patient taking differently: Take 800 mg by mouth every 8 (eight) hours as needed (anxiety).) 90 tablet 5    LORazepam (ATIVAN) 1 MG tablet TAKE ONE TABLET BY MOUTH EVERY 8 HOURS AS NEEDED FOR ANXIETY 90 tablet 5    triamcinolone cream (KENALOG) 0.1 % APPLY 1 APPLICATION TOPICALLY TWO TIMES A DAY (Patient taking differently: Apply 1 Application topically 2 (two) times daily as needed (eczema).) 15 g 0    metroNIDAZOLE (FLAGYL) 500 MG tablet Take 1 tablet (500 mg total) by mouth 2 (two) times daily. (Patient not taking: Reported on 04/25/2022) 14 tablet 0 Not Taking   scopolamine (TRANSDERM SCOP, 1.5 MG,) 1 MG/3DAYS Place 1 patch (1.5 mg total) onto the skin every 3 (three) days. (Patient not taking: Reported on 03/07/2022) 10 patch 0      Hospital Medications: Current Facility-Administered Medications  Medication Dose Route Frequency Provider Last Rate Last Admin   0.9 %  sodium chloride infusion   Intravenous Continuous Aletha Halim, MD       ceFAZolin (ANCEF) IVPB 2g/100 mL premix  2 g Intravenous On Call to [place], MD       chlorhexidine (PERIDEX) 0.12  % solution 15 mL  15 mL Mouth/Throat Once Pervis Hocking, DO       Or   Oral care mouth rinse  15 mL Mouth Rinse Once Nunzio Cobbs M, DO       lactated  ringers infusion   Intravenous Continuous Pervis Hocking, DO         Physical Exam:  Current Vital Signs 24h Vital Sign Ranges  T (!) 97.5 F (36.4 C) Temp  Avg: 97.5 F (36.4 C)  Min: 97.5 F (36.4 C)  Max: 97.5 F (36.4 C)  BP 127/63 BP  Min: 127/63  Max: 127/63  HR 76 Pulse  Avg: 76  Min: 76  Max: 76  RR 18 Resp  Avg: 18  Min: 18  Max: 18  SaO2 100 %   SpO2  Avg: 100 %  Min: 100 %  Max: 100 %       24 Hour I/O Current Shift I/O  Time Ins Outs No intake/output data recorded. No intake/output data recorded.   General appearance: Well nourished, well developed female in no acute distress.  Neck:  Supple, normal appearance, and no thyromegaly  Cardiovascular: normal s1 and s2.  No murmurs, rubs or gallops. Respiratory:  Clear to auscultation bilateral. Normal respiratory effort Abdomen: positive bowel sounds and no masses, hernias; diffusely non tender to palpation, non distended Neuro/Psych:  Normal mood and affect.  Skin:  Warm and dry.  Lymphatic:  No inguinal lymphadenopathy.    From 03/07/2022 Pelvic exam: is limited by body habitus EGBUS: within normal limits Vagina: scant old blood in the vault, no active bleeding Cervix: normal appearing cervix without tenderness, discharge or lesions. Uterus:  nonenlarged and non tender Adnexa:  normal adnexa and no mass, fullness, tenderness Rectovaginal: deferred   Laboratory: UPT: neg     Latest Ref Rng & Units 05/02/2022    2:07 PM 02/27/2022   12:00 AM 02/01/2022    3:01 PM  CBC  WBC 4.0 - 10.5 K/uL 5.8  4.4  3.8   Hemoglobin 12.0 - 15.0 g/dL 8.5  12.1  9.8   Hematocrit 36.0 - 46.0 % 28.0  39.0  34.2   Platelets 150 - 400 K/uL 326  254  189      Recent Labs  Lab 05/02/22 1407  NA 139  K 4.1  CL 108  CO2 25  BUN 9  CREATININE 0.77  CALCIUM  9.2  PROT 7.5  BILITOT 0.2*  ALKPHOS 61  ALT 15  AST 15  GLUCOSE 109*   No results for input(s): "APTT", "INR", "PTT" in the last 168 hours.  Invalid input(s): "DRHAPTT" Recent Labs  Lab 05/02/22 1355  [place] A POS   Imaging:  Narrative & Impression  CLINICAL DATA:  Abnormal uterine bleeding.  LMP 02/25/2022   EXAM: TRANSABDOMINAL AND TRANSVAGINAL ULTRASOUND OF PELVIS   TECHNIQUE: Both transabdominal and transvaginal ultrasound examinations of the pelvis were performed. Transabdominal technique was performed for global imaging of the pelvis including uterus, ovaries, adnexal regions, and pelvic cul-de-sac. It was necessary to proceed with endovaginal exam following the transabdominal exam to visualize the uterus.   COMPARISON:  None Available.   FINDINGS: Uterus   Measurements: 13.4 x 6.3 x 10.9 centimeters = volume: 483.7 mL. Central fibroid displaces the endometrial stripe and measures 9.9 x 9.2 x 10.1 centimeters.   Endometrium   Thickness: 6.9 millimeters.  No focal abnormality visualized.   Right ovary   Measurements: 3.7 x 2.9 x 3.6 centimeters = volume: 20.2 mL. Normal appearance/no adnexal mass.   Left ovary   Measurements: 3.7 x 2.1 x 3.0 centimeters = volume: 12.3 mL. Normal appearance/no adnexal mass.   Other findings   Trace free pelvic fluid is likely physiologic.  IMPRESSION: 1. Large central uterine fibroid displacing the endometrial canal measuring 10.1 centimeters. Given the proximity of the fibroid with the endometrial canal, a submucosal component is felt to be likely. 2. Normal appearance of both ovaries.     Electronically Signed   By: Nolon Nations M.D.   On: 03/15/2022 17:05    Assessment: Angela Hahn is a 46 y.o. [phone] (Patient's last menstrual period was 04/26/2022.) here for scheduled hysterectomy; patient stable  Plan: Plan for total laparoscopic hysterectomy/bilateral salpingectomy/cystoscopy. Will check intra op  hgb before starting surgery with 2U PRBC already on hold and d/w her re: possible need for blood transfusion, especially since she just had a period.  Plan to keep overnight, check post op cbc early evening and plan to start ppx lovenox 12-24 hours post op   Durene Romans. MD Attending Center for [facility])

## 2022-05-09 NOTE — Anesthesia Procedure Notes (Addendum)
Arterial Line Insertion Start/End9/07/2022 10:00 AM, 05/09/2022 10:05 AM Performed by: Pervis Hocking, DO, anesthesiologist  Patient location: Pre-op. Preanesthetic checklist: patient identified, IV checked, site marked, risks and benefits discussed, surgical consent, monitors and equipment checked, pre-op evaluation, timeout performed and anesthesia consent Lidocaine 1% used for infiltration Right, radial was placed Catheter size: 20 G Hand hygiene performed  and maximum sterile barriers used   Attempts: 1 Procedure performed without using ultrasound guided technique. Following insertion, dressing applied. Post procedure assessment: normal and unchanged  Patient tolerated the procedure well with no immediate complications.

## 2022-05-09 NOTE — Plan of Care (Signed)
  Problem: Education: Goal: Knowledge of General Education information will improve Description Including pain rating scale, medication(s)/side effects and non-pharmacologic comfort measures Outcome: Progressing   Problem: Health Behavior/Discharge Planning: Goal: Ability to manage health-related needs will improve Outcome: Progressing   

## 2022-05-09 NOTE — Anesthesia Procedure Notes (Signed)
Procedure Name: Intubation Date/Time: 05/09/2022 9:03 AM  Performed by: Darletta Moll, CRNAPre-anesthesia Checklist: Patient identified, Emergency Drugs available, Suction available and Patient being monitored Patient Re-evaluated:Patient Re-evaluated prior to induction Oxygen Delivery Method: Circle system utilized Preoxygenation: Pre-oxygenation with 100% oxygen Induction Type: IV induction Ventilation: Mask ventilation without difficulty Laryngoscope Size: Mac and 3 Grade View: Grade I Tube type: Oral Tube size: 7.0 mm Number of attempts: 1 Airway Equipment and Method: Stylet and Oral airway Placement Confirmation: ETT inserted through vocal cords under direct vision, positive ETCO2 and breath sounds checked- equal and bilateral Secured at: 21 cm Tube secured with: Tape Dental Injury: Teeth and Oropharynx as per pre-operative assessment

## 2022-05-10 ENCOUNTER — Encounter (HOSPITAL_COMMUNITY): Payer: Self-pay | Admitting: Obstetrics and Gynecology

## 2022-05-10 MED ORDER — OXYCODONE HCL 5 MG PO TABS
5.0000 mg | ORAL_TABLET | ORAL | Status: DC | PRN
  Administered 2022-05-10: 10 mg via ORAL
  Administered 2022-05-10: 5 mg via ORAL
  Administered 2022-05-11 – 2022-05-12 (×5): 10 mg via ORAL
  Filled 2022-05-10 (×5): qty 2
  Filled 2022-05-10: qty 1
  Filled 2022-05-10: qty 2

## 2022-05-10 MED ORDER — SODIUM CHLORIDE 0.9 % IV SOLN
500.0000 mg | Freq: Once | INTRAVENOUS | Status: AC
  Administered 2022-05-10: 500 mg via INTRAVENOUS
  Filled 2022-05-10: qty 25

## 2022-05-10 MED ORDER — METOCLOPRAMIDE HCL 5 MG/ML IJ SOLN
10.0000 mg | Freq: Four times a day (QID) | INTRAMUSCULAR | Status: DC | PRN
  Administered 2022-05-10 – 2022-05-11 (×2): 10 mg via INTRAVENOUS
  Filled 2022-05-10: qty 2

## 2022-05-10 MED ORDER — ALUM & MAG HYDROXIDE-SIMETH 200-200-20 MG/5ML PO SUSP
30.0000 mL | Freq: Once | ORAL | Status: AC
  Administered 2022-05-10: 30 mL via ORAL
  Filled 2022-05-10: qty 30

## 2022-05-10 MED ORDER — LIDOCAINE VISCOUS HCL 2 % MT SOLN
15.0000 mL | Freq: Once | OROMUCOSAL | Status: AC
  Administered 2022-05-10: 15 mL via ORAL
  Filled 2022-05-10: qty 15

## 2022-05-10 MED ORDER — BISACODYL 5 MG PO TBEC
5.0000 mg | DELAYED_RELEASE_TABLET | Freq: Once | ORAL | Status: AC
  Administered 2022-05-10: 5 mg via ORAL
  Filled 2022-05-10: qty 1

## 2022-05-10 MED ORDER — METOCLOPRAMIDE HCL 5 MG/ML IJ SOLN
INTRAMUSCULAR | Status: AC
  Filled 2022-05-10: qty 2

## 2022-05-10 MED ORDER — KETOROLAC TROMETHAMINE 15 MG/ML IJ SOLN
15.0000 mg | Freq: Four times a day (QID) | INTRAMUSCULAR | Status: AC
  Administered 2022-05-10 – 2022-05-11 (×4): 15 mg via INTRAVENOUS
  Filled 2022-05-10 (×4): qty 1

## 2022-05-10 NOTE — Progress Notes (Signed)
Discontinue Pca wasted  23 ml of Dilaudid PCA with Lucrezia Starch RN

## 2022-05-10 NOTE — Progress Notes (Signed)
Gynecology Progress Note  Admission Date: 05/09/2022 Current Date: 05/10/2022 8:42 AM  Angela Hahn is a 46 y.o. [id number] POD#1 s/p diagnostic laparoscopy converted to TAH/BS (EBL 352m) due to large fibroid uterus.    History complicated by: Patient Active Problem List   Diagnosis Date Noted   Abnormal uterine bleeding (AUB) 05/09/2022   Status post hysterectomy 05/09/2022   Fibroid 03/18/2022   Dysmenorrhea 03/07/2022   Iron deficiency anemia 01/26/2022   HTN (hypertension) 09/05/2021   Anxiety disorder 07/07/2021   Mixed hyperlipidemia 07/04/2021   [place] 06/[place]   Menorrhagia with regular cycle 10/27/2020   Pain of right thumb 06/04/2018   Chronic low back pain 06/18/2012    ROS and patient/family/surgical history, located on admission H&P note dated 05/09/2022, have been reviewed, and there are no changes except as noted below Yesterday/Overnight Events:  None  Subjective:  Some ambulation. No flatus, nausea or vomiting. Pain controlled  Objective:    Current Vital Signs 24h Vital Sign Ranges  T 98.4 F (36.9 C) Temp  Avg: 97.8 F (36.6 C)  Min: 97 F (36.1 C)  Max: 98.4 F (36.9 C)  BP 123/72 BP  Min: 109/68  Max: 160/74  HR 66 Pulse  Avg: 70.1  Min: 63  Max: 76  RR 16 Resp  Avg: 16.5  Min: 13  Max: 22  SaO2 100 % Room Air SpO2  Avg: 99.1 %  Min: 95 %  Max: 100 %       24 Hour I/O Current Shift I/O  Time Ins Outs 09/12 0701 - 09/13 0700 In: 1700 [I.V.:1600] Out: 2350 [Urine:2000] No intake/output data recorded.   Patient Vitals for the past 12 hrs:  BP Temp Temp src Pulse Resp SpO2  05/10/22 0720 123/72 -- -- 66 16 100 %  05/10/22 0514 109/68 98.4 F (36.9 C) Oral 71 18 100 %  05/10/22 0423 -- -- -- -- 18 100 %  05/10/22 0012 -- -- -- -- 17 98 %  05/09/22 2304 (!) 148/83 97.8 F (36.6 C) Oral 70 18 100 %      Physical exam: General appearance: alert, cooperative, and appears stated age Abdomen: soft, non-tender;  bowel sounds normal; no masses,  no organomegaly and rare BS, mildly distended, appropriately ttp. C/d/I honeycomb dressing GU: No gross VB. Clear UOP in foley Lungs: clear to auscultation bilaterally Heart: S1, S2 normal, no murmur, rub or gallop, regular rate and rhythm Extremities: SCDs in place Skin: warm and dry Psych: appropriate Neurologic: Grossly normal  Medications Current Facility-Administered Medications  Medication Dose Route Frequency Provider Last Rate Last Admin   0.9 %  sodium chloride infusion   Intravenous Continuous PAletha Halim MD 125 mL/hr at 05/10/22 0556 New Bag at 05/10/22 0556   acetaminophen (TYLENOL) tablet 650 mg  650 mg Oral Q6H PAletha Halim MD   650 mg at 05/10/22 0550   aspirin EC tablet 81 mg  81 mg Oral q AM PAletha Halim MD   81 mg at 05/10/22 0550   atorvastatin (LIPITOR) tablet 40 mg  40 mg Oral Daily PAletha Halim MD       diltiazem (CARDIZEM CD) 24 hr capsule 180 mg  180 mg Oral Daily PAletha Halim MD       enoxaparin (LOVENOX) injection 40 mg  40 mg Subcutaneous Q24H PAletha Halim MD   40 mg at 05/09/22 2107   gabapentin (NEURONTIN) capsule 200 mg  200 mg Oral BID PAletha Halim MD  200 mg at 05/09/22 2106   iron sucrose (VENOFER) 500 mg in sodium chloride 0.9 % 250 mL IVPB  500 mg Intravenous Once Aletha Halim, MD       ondansetron (ZOFRAN) tablet 4 mg  4 mg Oral Q6H PRN Aletha Halim, MD       Or   ondansetron (ZOFRAN) injection 4 mg  4 mg Intravenous Q6H PRN Aletha Halim, MD   4 mg at 05/10/22 0817   oxyCODONE (Oxy IR/ROXICODONE) immediate release tablet 5-10 mg  5-10 mg Oral Q4H PRN Aletha Halim, MD       pantoprazole (PROTONIX) injection 40 mg  40 mg Intravenous QHS Aletha Halim, MD   40 mg at 05/09/22 2107   polyethylene glycol (MIRALAX / GLYCOLAX) packet 17 g  17 g Oral Daily Aletha Halim, MD       simethicone (MYLICON) chewable tablet 80 mg  80 mg Oral TID Aletha Halim, MD   80 mg at  05/09/22 2106   Labs  Recent Labs  Lab 05/09/22 1055 05/09/22 1155 05/09/22 2111  WBC  --   --  12.1*  HGB 8.5* 8.2* 8.5*  HCT 25.0* 24.0* 28.5*  PLT  --   --  240    Recent Labs  Lab 05/09/22 0917 05/09/22 1055 05/09/22 1155  NA 141 139 137  K 3.8 3.5 4.1  CL 104 105 104  BUN 8 6 5*  CREATININE 0.50 0.40* 0.50  GLUCOSE 113* 175* 158*    Radiology none  Assessment & Plan:  Pt doing well *GYN: routine care. D/c foley and pca today. Start regular diet. *PPx: continue lovenox *Anemia: venofer ordered.  *HTN: continue on home meds *Dispo: likely pod#2-3  Code Status: Full Code  Durene Romans MD Attending Center for [facility])

## 2022-05-11 LAB — CBC
HCT: 19.4 % — ABNORMAL LOW (ref 36.0–46.0)
Hemoglobin: 5.9 g/dL — CL (ref 12.0–15.0)
MCH: 22.8 pg — ABNORMAL LOW (ref 26.0–34.0)
MCHC: 30.4 g/dL (ref 30.0–36.0)
MCV: 74.9 fL — ABNORMAL LOW (ref 80.0–100.0)
Platelets: 255 10*3/uL (ref 150–400)
RBC: 2.59 MIL/uL — ABNORMAL LOW (ref 3.87–5.11)
RDW: 15.7 % — ABNORMAL HIGH (ref 11.5–15.5)
WBC: 7.4 10*3/uL (ref 4.0–10.5)
nRBC: 0.3 % — ABNORMAL HIGH (ref 0.0–0.2)

## 2022-05-11 LAB — CBC WITH DIFFERENTIAL/PLATELET
Abs Immature Granulocytes: 0.12 10*3/uL — ABNORMAL HIGH (ref 0.00–0.07)
Basophils Absolute: 0.1 10*3/uL (ref 0.0–0.1)
Basophils Relative: 1 %
Eosinophils Absolute: 0 10*3/uL (ref 0.0–0.5)
Eosinophils Relative: 0 %
HCT: 28.4 % — ABNORMAL LOW (ref 36.0–46.0)
Hemoglobin: 8.8 g/dL — ABNORMAL LOW (ref 12.0–15.0)
Immature Granulocytes: 1 %
Lymphocytes Relative: 28 %
Lymphs Abs: 2.5 10*3/uL (ref 0.7–4.0)
MCH: 24 pg — ABNORMAL LOW (ref 26.0–34.0)
MCHC: 31 g/dL (ref 30.0–36.0)
MCV: 77.6 fL — ABNORMAL LOW (ref 80.0–100.0)
Monocytes Absolute: 0.7 10*3/uL (ref 0.1–1.0)
Monocytes Relative: 7 %
Neutro Abs: 5.7 10*3/uL (ref 1.7–7.7)
Neutrophils Relative %: 63 %
Platelets: 290 10*3/uL (ref 150–400)
RBC: 3.66 MIL/uL — ABNORMAL LOW (ref 3.87–5.11)
RDW: 15.5 % (ref 11.5–15.5)
WBC: 9.1 10*3/uL (ref 4.0–10.5)
nRBC: 0.8 % — ABNORMAL HIGH (ref 0.0–0.2)

## 2022-05-11 LAB — PREPARE RBC (CROSSMATCH)

## 2022-05-11 LAB — SURGICAL PATHOLOGY

## 2022-05-11 MED ORDER — SODIUM CHLORIDE 0.9% IV SOLUTION: Freq: Once | INTRAVENOUS | Status: AC

## 2022-05-11 MED ORDER — ACETAMINOPHEN 325 MG PO TABS
650.0000 mg | ORAL_TABLET | Freq: Once | ORAL | Status: AC
  Administered 2022-05-11: 650 mg via ORAL
  Filled 2022-05-11: qty 2

## 2022-05-11 MED ORDER — PANTOPRAZOLE SODIUM 40 MG PO TBEC
40.0000 mg | DELAYED_RELEASE_TABLET | Freq: Every day | ORAL | Status: DC
  Administered 2022-05-11: 40 mg via ORAL
  Filled 2022-05-11: qty 1

## 2022-05-11 MED ORDER — DIPHENHYDRAMINE HCL 25 MG PO CAPS
25.0000 mg | ORAL_CAPSULE | Freq: Once | ORAL | Status: AC
  Administered 2022-05-11: 25 mg via ORAL
  Filled 2022-05-11: qty 1

## 2022-05-11 MED ORDER — DIPHENHYDRAMINE HCL 25 MG PO CAPS
25.0000 mg | ORAL_CAPSULE | Freq: Once | ORAL | Status: DC
  Filled 2022-05-11: qty 1

## 2022-05-11 NOTE — Progress Notes (Signed)
Gynecology Progress Note  Admission Date: 05/09/2022 Current Date: 05/11/2022 12:30 PM  Angela Hahn is a 46 y.o. [id number] POD#2 s/p diagnostic laparoscopy converted to TAH/BS (EBL 349m) due to large fibroid uterus.    History complicated by: Patient Active Problem List   Diagnosis Date Noted   Abnormal uterine bleeding (AUB) 05/09/2022   Status post hysterectomy 05/09/2022   Fibroid 03/18/2022   Dysmenorrhea 03/07/2022   Iron deficiency anemia 01/26/2022   HTN (hypertension) 09/05/2021   Anxiety disorder 07/07/2021   Mixed hyperlipidemia 07/04/2021   CV[place]) [place] 06/23/20[place]   Menorrhagia with regular cycle 10/27/2020   Pain of right thumb 06/04/2018   Chronic low back pain 06/18/2012    ROS and patient/family/surgical history, located on admission H&P note dated 05/09/2022, have been reviewed, and there are no changes except as noted below Yesterday/Overnight Events:  Low H/H this morning, symptomatic anemia  Subjective:  +ambulation, flatus, pain controlled with PO and taking some PO food/drink  Morning hgb 5.9 from 8.5 pre operatively and patient with HA, some flashes in vision and feeling tired.  Had some VB yesterday (?half pad for the day) but none today. Still voiding w/o issue.   Objective:    Current Vital Signs 24h Vital Sign Ranges  T 97.8 F (36.6 C) Temp  Avg: 98.2 F (36.8 C)  Min: 97.8 F (36.6 C)  Max: 98.5 F (36.9 C)  BP 137/68 BP  Min: 111/59  Max: 137/68  HR 71 Pulse  Avg: 72.9  Min: 67  Max: 86  RR 17 Resp  Avg: 16.8  Min: 16  Max: 17  SaO2 100 % Room Air SpO2  Avg: 99.4 %  Min: 97 %  Max: 100 %       24 Hour I/O Current Shift I/O  Time Ins Outs 09/13 0701 - 09/14 0700 In: 3467.1 [P.O.:480; I.V.:2820.8] Out: 550 [Urine:550] 09/14 0701 - 09/14 1900 In: 392  Out: -    Patient Vitals for the past 12 hrs:  BP Temp Temp src Pulse Resp SpO2  05/11/22 1210 137/68 97.8 F (36.6 C) Oral 71 17 100 %  05/11/22 1104 116/78  98.3 F (36.8 C) Oral 67 17 100 %  05/11/22 0934 (!) 111/59 97.8 F (36.6 C) Oral 69 17 98 %  05/11/22 0919 111/78 98.3 F (36.8 C) Oral 72 17 100 %  05/11/22 0910 111/78 98.3 F (36.8 C) Oral 72 17 100 %  05/11/22 0835 119/65 98.3 F (36.8 C) Oral 86 16 100 %  05/11/22 0224 118/62 98.2 F (36.8 C) Oral 69 17 97 %    Physical exam: General appearance: alert, cooperative, and appears stated age Abdomen: soft, non-tender; bowel sounds normal; no masses,  no organomegaly and + BS, mildly distended, appropriately ttp. C/d/I honeycomb dressing Lungs: clear to auscultation bilaterally Heart: S1, S2 normal, no murmur, rub or gallop, regular rate and rhythm Extremities: SCDs in place Skin: warm and dry Psych: appropriate Neurologic: Grossly normal  Medications Current Facility-Administered Medications  Medication Dose Route Frequency Provider Last Rate Last Admin   0.9 %  sodium chloride infusion   Intravenous Continuous PAletha Halim MD 125 mL/hr at 05/11/22 0525 New Bag at 05/11/22 0525   acetaminophen (TYLENOL) tablet 650 mg  650 mg Oral Q6H PAletha Halim MD   650 mg at 05/11/22 0522   aspirin EC tablet 81 mg  81 mg Oral q AM PAletha Halim MD   81 mg at 05/11/22 0522  atorvastatin (LIPITOR) tablet 40 mg  40 mg Oral Daily Aletha Halim, MD   40 mg at 05/11/22 1111   diltiazem (CARDIZEM CD) 24 hr capsule 180 mg  180 mg Oral Daily Aletha Halim, MD   180 mg at 05/11/22 1112   enoxaparin (LOVENOX) injection 40 mg  40 mg Subcutaneous Q24H Aletha Halim, MD   40 mg at 05/10/22 2120   gabapentin (NEURONTIN) capsule 200 mg  200 mg Oral BID Aletha Halim, MD   200 mg at 05/11/22 1112   metoCLOPramide (REGLAN) injection 10 mg  10 mg Intravenous Q6H PRN Aletha Halim, MD   10 mg at 05/11/22 0040   ondansetron (ZOFRAN) tablet 4 mg  4 mg Oral Q6H PRN Aletha Halim, MD   4 mg at 05/10/22 2122   Or   ondansetron (ZOFRAN) injection 4 mg  4 mg Intravenous Q6H PRN  Aletha Halim, MD   4 mg at 05/10/22 0817   oxyCODONE (Oxy IR/ROXICODONE) immediate release tablet 5-10 mg  5-10 mg Oral Q4H PRN Aletha Halim, MD   10 mg at 05/11/22 0800   pantoprazole (PROTONIX) EC tablet 40 mg  40 mg Oral QHS Benetta Spar D, RPH       polyethylene glycol (MIRALAX / GLYCOLAX) packet 17 g  17 g Oral Daily Aletha Halim, MD   17 g at 05/11/22 1112   simethicone (MYLICON) chewable tablet 80 mg  80 mg Oral TID Aletha Halim, MD   80 mg at 05/11/22 [place]  Lab 05/09/22 1155 05/09/22 2111 05/11/22 0640  WBC  --  12.1* 7.4  HGB 8.2* 8.5* 5.9*  HCT 24.0* 28.5* 19.4*  PLT  --  240 255    Recent Labs  Lab 05/09/22 0917 05/09/22 1055 05/09/22 1155  NA 141 139 137  K 3.8 3.5 4.1  CL 104 105 104  BUN 8 6 5*  CREATININE 0.50 0.40* 0.50  GLUCOSE 113* 175* 158*    Radiology none  Assessment & Plan:  Pt doing well *GYN: normal H/H drop for her surgery but patient started off low. One unit PRBC already and 2nd one to get started soon. Plan is to check post transfusion h/h and f/u s/s. I told her she may need a third one -s/p venofer on 9/13 *PPx: continue lovenox *HTN: continue on home meds *Dispo: likely pod#3  Code Status: Full Code  Durene Romans MD Attending Center for [facility])

## 2022-05-11 NOTE — Progress Notes (Signed)
Patient ID: Angela Hahn, female   DOB: [birth year], 46 y.o.   MRN: [id number] CRITICAL VALUE STICKER  CRITICAL VALUE: Hgb 5.9    MD NOTIFIED: Yes  TIME OF NOTIFICATION: 5583  RESPONSE:  Will put in orders and let Dr. Ilda Basset know  Haydee Salter, RN

## 2022-05-11 NOTE — Progress Notes (Signed)
Still having on and off nausea, PRN given.

## 2022-05-12 ENCOUNTER — Other Ambulatory Visit (HOSPITAL_COMMUNITY): Payer: Self-pay

## 2022-05-12 DIAGNOSIS — D649 Anemia, unspecified: Secondary | ICD-10-CM

## 2022-05-12 LAB — TYPE AND SCREEN
ABO/RH(D): A POS
Antibody Screen: NEGATIVE
Unit division: 0
Unit division: 0
Unit division: 0
Unit division: 0

## 2022-05-12 LAB — BPAM RBC
Blood Product Expiration Date: 202310052359
Blood Product Expiration Date: 202310102359
Blood Product Expiration Date: 202310122359
Blood Product Expiration Date: 202310122359
ISSUE DATE / TIME: 202309121329
ISSUE DATE / TIME: 202309140859
ISSUE DATE / TIME: 202309141350
Unit Type and Rh: 5100
Unit Type and Rh: 5100
Unit Type and Rh: 6200
Unit Type and Rh: 6200

## 2022-05-12 MED ORDER — SIMETHICONE 80 MG PO CHEW
80.0000 mg | CHEWABLE_TABLET | Freq: Two times a day (BID) | ORAL | 0 refills | Status: AC
  Filled 2022-05-12: qty 28, 14d supply, fill #0

## 2022-05-12 MED ORDER — ENOXAPARIN (LOVENOX) PATIENT EDUCATION KIT
PACK | Freq: Once | Status: DC
  Filled 2022-05-12: qty 1

## 2022-05-12 MED ORDER — METOCLOPRAMIDE HCL 10 MG PO TABS
10.0000 mg | ORAL_TABLET | Freq: Three times a day (TID) | ORAL | 0 refills | Status: DC | PRN
  Filled 2022-05-12: qty 20, 7d supply, fill #0

## 2022-05-12 MED ORDER — BISACODYL 5 MG PO TBEC
5.0000 mg | DELAYED_RELEASE_TABLET | Freq: Every day | ORAL | 0 refills | Status: DC | PRN
  Filled 2022-05-12: qty 10, 10d supply, fill #0

## 2022-05-12 MED ORDER — POLYETHYLENE GLYCOL 3350 17 GM/SCOOP PO POWD
17.0000 g | Freq: Every day | ORAL | 0 refills | Status: AC
  Filled 2022-05-12: qty 238, 14d supply, fill #0

## 2022-05-12 MED ORDER — OXYCODONE HCL 5 MG PO TABS
5.0000 mg | ORAL_TABLET | ORAL | 0 refills | Status: DC | PRN
  Filled 2022-05-12: qty 35, 3d supply, fill #0

## 2022-05-12 MED ORDER — GABAPENTIN 100 MG PO CAPS
200.0000 mg | ORAL_CAPSULE | Freq: Two times a day (BID) | ORAL | 0 refills | Status: DC
  Filled 2022-05-12: qty 28, 7d supply, fill #0

## 2022-05-12 MED ORDER — ACETAMINOPHEN 325 MG PO TABS
650.0000 mg | ORAL_TABLET | Freq: Four times a day (QID) | ORAL | 1 refills | Status: AC | PRN
  Filled 2022-05-12: qty 30, 4d supply, fill #0

## 2022-05-12 MED ORDER — ENOXAPARIN SODIUM 40 MG/0.4ML IJ SOSY
40.0000 mg | PREFILLED_SYRINGE | INTRAMUSCULAR | 0 refills | Status: DC
  Filled 2022-05-12: qty 4.4, 11d supply, fill #0

## 2022-05-12 MED ORDER — IBUPROFEN 600 MG PO TABS
600.0000 mg | ORAL_TABLET | Freq: Four times a day (QID) | ORAL | 0 refills | Status: DC | PRN
  Filled 2022-05-12: qty 30, 8d supply, fill #0

## 2022-05-12 MED ORDER — ENOXAPARIN (LOVENOX) PATIENT EDUCATION KIT
1.0000 | PACK | Freq: Once | 0 refills | Status: AC
  Filled 2022-05-12: qty 1, 1d supply, fill #0

## 2022-05-12 MED ORDER — FERROUS SULFATE 325 (65 FE) MG PO TABS: 325.0000 mg | ORAL_TABLET | ORAL | 0 refills | Status: DC

## 2022-05-12 NOTE — Progress Notes (Signed)
Gynecology Progress Note  Admission Date: 05/09/2022 Current Date: 05/12/2022 7:28 AM  Angela Hahn Angela Hahn is a 46 y.o. [id number] POD#3 s/p diagnostic laparoscopy converted to TAH/BS (EBL 379m) due to large fibroid uterus.    History complicated by: Patient Active Problem List   Diagnosis Date Noted   Postoperative anemia 05/12/2022   Abnormal uterine bleeding (AUB) 05/09/2022   Status post hysterectomy 05/09/2022   Fibroid 03/18/2022   Dysmenorrhea 03/07/2022   Iron deficiency anemia 01/26/2022   HTN (hypertension) 09/05/2021   Anxiety disorder 07/07/2021   Mixed hyperlipidemia 07/04/2021   [place] 06/23[place]   Menorrhagia with regular cycle 10/27/2020   Pain of right thumb 06/04/2018   Chronic low back pain 06/18/2012    ROS and patient/family/surgical history, located on admission H&P note dated 05/09/2022, have been reviewed, and there are no changes except as noted below Yesterday/Overnight Events:  S/p 2U PRBC with appropriate rise  Subjective:  +ambulation, flatus with small BM, pain controlled with PO and taking some PO food/drink. No s/s of anemia.   Small vaginal spotting  Objective:    Current Vital Signs 24h Vital Sign Ranges  T 98.6 F (37 C) Temp  Avg: 98.3 F (36.8 C)  Min: 97.7 F (36.5 C)  Max: 98.8 F (37.1 C)  BP 137/64 BP  Min: 111/59  Max: 137/64  HR 75 Pulse  Avg: 72.4  Min: 66  Max: 86  RR 17 Resp  Avg: 16.6  Min: 16  Max: 18  SaO2 100 % Room Air SpO2  Avg: 99.5 %  Min: 97 %  Max: 100 %       24 Hour I/O Current Shift I/O  Time Ins Outs 09/14 0701 - 09/15 0700 In: 8147[I.V.:10] Out: -  No intake/output data recorded.   Patient Vitals for the past 12 hrs:  BP Temp Temp src Pulse Resp SpO2  05/12/22 0631 137/64 98.6 F (37 C) Oral 75 17 100 %  05/11/22 2020 119/68 98.4 F (36.9 C) Oral 66 18 100 %    Physical exam: General appearance: alert, cooperative, and appears stated age Abdomen: soft, non-tender; bowel  sounds normal; no masses,  no organomegaly and + BS, mildly distended, appropriately ttp. C/d/I honeycomb dressing >>removed and c/d/I with steri strips in place Lungs: clear to auscultation bilaterally Heart: S1, S2 normal, no murmur, rub or gallop, regular rate and rhythm Extremities: SCDs in place Skin: warm and dry Psych: appropriate Neurologic: Grossly normal  Medications Current Facility-Administered Medications  Medication Dose Route Frequency Provider Last Rate Last Admin   acetaminophen (TYLENOL) tablet 650 mg  650 mg Oral Q6H PAletha Halim MD   650 mg at 05/12/22 08295  aspirin EC tablet 81 mg  81 mg Oral q AM PAletha Halim MD   81 mg at 05/12/22 0616   atorvastatin (LIPITOR) tablet 40 mg  40 mg Oral Daily PAletha Halim MD   40 mg at 05/11/22 1111   diltiazem (CARDIZEM CD) 24 hr capsule 180 mg  180 mg Oral Daily PAletha Halim MD   180 mg at 05/11/22 1112   enoxaparin (LOVENOX) injection 40 mg  40 mg Subcutaneous Q24H PAletha Halim MD   40 mg at 05/11/22 2106   enoxaparin (LOVENOX) patient education kit   Does not apply Once PAletha Halim MD       gabapentin (NEURONTIN) capsule 200 mg  200 mg Oral BID PAletha Halim MD   200 mg at 05/11/22 2105  metoCLOPramide (REGLAN) injection 10 mg  10 mg Intravenous Q6H PRN Aletha Halim, MD   10 mg at 05/11/22 0040   ondansetron (ZOFRAN) tablet 4 mg  4 mg Oral Q6H PRN Aletha Halim, MD   4 mg at 05/10/22 2122   Or   ondansetron (ZOFRAN) injection 4 mg  4 mg Intravenous Q6H PRN Aletha Halim, MD   4 mg at 05/10/22 0817   oxyCODONE (Oxy IR/ROXICODONE) immediate release tablet 5-10 mg  5-10 mg Oral Q4H PRN Aletha Halim, MD   10 mg at 05/12/22 0616   pantoprazole (PROTONIX) EC tablet 40 mg  40 mg Oral QHS Donnamae Jude, RPH   40 mg at 05/11/22 2105   polyethylene glycol (MIRALAX / GLYCOLAX) packet 17 g  17 g Oral Daily Aletha Halim, MD   17 g at 05/11/22 1112   simethicone (MYLICON) chewable tablet 80 mg   80 mg Oral TID Aletha Halim, MD   80 mg at 05/11/22 2105   Labs  Recent Labs  Lab 05/09/22 2111 05/11/22 0640 05/11/22 2056  WBC 12.1* 7.4 9.1  HGB 8.5* 5.9* 8.8*  HCT 28.5* 19.4* 28.4*  PLT 240 255 290    Recent Labs  Lab 05/09/22 0917 05/09/22 1055 05/09/22 1155  NA 141 139 137  K 3.8 3.5 4.1  CL 104 105 104  BUN 8 6 5*  CREATININE 0.50 0.40* 0.50  GLUCOSE 113* 175* 158*    Radiology none  Assessment & Plan:  Pt doing well *GYN: final pathology negative.  -s/p venofer on 9/13 *PPx: continue lovenox for two weeks total post op *HTN: continue on home meds *Dispo: llater today  Code Status: Full Code  Durene Romans MD Attending Center for [place] ([facility])

## 2022-05-12 NOTE — TOC CM/SW Note (Signed)
Called Lacresia with [facility] for shower stool. Left voicemail.

## 2022-05-12 NOTE — Discharge Summary (Incomplete)
Physician Discharge Summary  Patient ID: Angela Hahn MRN: [id number] DOB/AGE: June 30, 1976 46 y.o.  Admit date: 05/09/2022 Discharge date: 05/12/2022  Admission Diagnoses:  Discharge Diagnoses:  Principal Problem:   Abnormal uterine bleeding (AUB) Active Problems:   Status post hysterectomy   Postoperative anemia   Discharged Condition: {condition:18240}  Hospital Course: ***  Consults: {consultation:18241}  Significant Diagnostic Studies: {diagnostics:18242}  Treatments: {Tx:18249}  Discharge Exam: Blood pressure 137/64, pulse 75, temperature 98.6 F (37 C), temperature source Oral, resp. rate 17, height 5' 6"  (1.676 m), weight 91.2 kg, last menstrual period 04/26/2022, SpO2 100 %. {physical IWPY:0998338}  Disposition: Discharge disposition: 01-Home or Self Care       Discharge Instructions     Discharge patient   Complete by: As directed    Discharge disposition: 01-Home or Self Care   Discharge patient date: 05/12/2022   Shower chair   Complete by: As directed       Allergies as of 05/12/2022       Reactions   Gadolinium Derivatives Itching, Swelling, Cough   Hydrocodone Other (See Comments)   Itching, takes Benadryl with this medicine   Tramadol Other (See Comments)   headache        Medication List     STOP taking these medications    metroNIDAZOLE 500 MG tablet Commonly known as: FLAGYL       TAKE these medications    acetaminophen 325 MG tablet Commonly known as: TYLENOL Take 2 tablets (650 mg total) by mouth every 6 (six) hours as needed. What changed:  medication strength how much to take reasons to take this   aspirin EC 81 MG tablet Take 81 mg by mouth in the morning. Swallow whole.   atorvastatin 40 MG tablet Commonly known as: LIPITOR TAKE 1 TABLET BY MOUTH DAILY   bisacodyl 5 MG EC tablet Commonly known as: bisacodyl Take 1 tablet (5 mg total) by mouth daily as needed for moderate constipation.   diltiazem 180 MG  24 hr capsule Commonly known as: CARDIZEM CD Take 1 capsule (180 mg total) by mouth daily.   enoxaparin 40 MG/0.4ML injection Commonly known as: LOVENOX Inject 0.4 mLs (40 mg total) into the skin daily for 11 days.   enoxaparin Kit Commonly known as: LOVENOX 1 kit by Does not apply route once for 1 dose.   ferrous sulfate 325 (65 FE) MG tablet Take 1 tablet (325 mg total) by mouth every other day for 42 doses. What changed: when to take this   gabapentin 100 MG capsule Commonly known as: NEURONTIN Take 2 capsules (200 mg total) by mouth 2 (two) times daily for 7 days.   ibuprofen 600 MG tablet Commonly known as: ADVIL Take 1 tablet (600 mg total) by mouth every 6 (six) hours as needed. What changed:  medication strength how much to take when to take this reasons to take this   LORazepam 1 MG tablet Commonly known as: ATIVAN TAKE ONE TABLET BY MOUTH EVERY 8 HOURS AS NEEDED FOR ANXIETY   metoCLOPramide 10 MG tablet Commonly known as: REGLAN Take 1 tablet (10 mg total) by mouth every 8 (eight) hours as needed for nausea or vomiting.   oxyCODONE 5 MG immediate release tablet Commonly known as: Oxy IR/ROXICODONE Take 1-2 tablets (5-10 mg total) by mouth every 4 (four) hours as needed for severe pain.   polyethylene glycol 17 g packet Commonly known as: MIRALAX / GLYCOLAX Take 17 g by mouth daily.   scopolamine 1  MG/3DAYS Commonly known as: Transderm Scop (1.5 MG) Place 1 patch (1.5 mg total) onto the skin every 3 (three) days.   simethicone 80 MG chewable tablet Commonly known as: MYLICON Chew 1 tablet (80 mg total) by mouth 2 (two) times daily after a meal for 14 days.   triamcinolone cream 0.1 % Commonly known as: KENALOG APPLY 1 APPLICATION TOPICALLY TWO TIMES A DAY What changed: See the new instructions.        Follow-up Information     Aletha Halim, MD. Go in 1 week(s).   Specialty: Obstetrics and Gynecology Why: the office will call you for this  inicision check Contact information: [address] [place] [address] [phone]                 Signed: Aletha Halim 05/12/2022, 7:30 AM

## 2022-05-12 NOTE — Progress Notes (Signed)
Complained of itchiness all over the body, MD notified. Benadryl PO given.

## 2022-05-12 NOTE — Discharge Instructions (Addendum)
Enoxaparin injection What is this medicine? ENOXAPARIN (ee nox a PA rin) is used after knee, hip, or abdominal surgeries to prevent blood clotting. It is also used to treat existing blood clots in the lungs or in the veins. This medicine may be used for other purposes; ask your health care provider or pharmacist if you have questions. COMMON BRAND NAME(S): Lovenox  What should I tell my health care provider before I take this medicine? They need to know if you have any of these conditions: bleeding disorders, hemorrhage, or hemophilia infection of the heart or heart valves kidney or liver disease previous stroke prosthetic heart valve recent surgery or delivery of a baby ulcer in the stomach or intestine, diverticulitis, or other bowel disease an unusual or allergic reaction to enoxaparin, heparin, pork or pork products, other medicines, foods, dyes, or preservatives pregnant or trying to get pregnant breast-feeding  How should I use this medicine? This medicine is for injection under the skin. It is usually given by a health-care professional. You or a family member may be trained on how to give the injections. If you are to give yourself injections, make sure you understand how to use the syringe, measure the dose if necessary, and give the injection. To avoid bruising, do not rub the site where this medicine has been injected. Do not take your medicine more often than directed. Do not stop taking except on the advice of your doctor or health care professional. Make sure you receive a puncture-resistant container to dispose of the needles and syringes once you have finished with them. Do not reuse these items. Return the container to your doctor or health care professional for proper disposal. Talk to your pediatrician regarding the use of this medicine in children. Special care may be needed. Overdosage: If you think you have taken too much of this medicine contact a poison control center  or emergency room at once. NOTE: This medicine is only for you. Do not share this medicine with others.  See this Video for how to administer Enoxaparin: https://www.lovenox.com/patient-self-injection-video   What if I miss a dose? If you miss a dose, take it as soon as you can. If it is almost time for your next dose, take only that dose. Do not take double or extra doses.  What may interact with this medicine? aspirin and aspirin-like medicines certain medicines that treat or prevent blood clots dipyridamole NSAIDs, medicines for pain and inflammation, like ibuprofen or naproxen This list may not describe all possible interactions. Give your health care provider a list of all the medicines, herbs, non-prescription drugs, or dietary supplements you use. Also tell them if you smoke, drink alcohol, or use illegal drugs. Some items may interact with your medicine.  What should I watch for while using this medicine? Visit your healthcare professional for regular checks on your progress. You may need blood work done while you are taking this medicine. Your condition will be monitored carefully while you are receiving this medicine. It is important not to miss any appointments. If you are going to need surgery or other procedure, tell your healthcare professional that you are using this medicine. Using this medicine for a long time may weaken your bones and increase the risk of bone fractures. Avoid sports and activities that might cause injury while you are using this medicine. Severe falls or injuries can cause unseen bleeding. Be careful when using sharp tools or knives. Consider using an Copy. Take special care brushing or  flossing your teeth. Report any injuries, bruising, or red spots on the skin to your healthcare professional. Wear a medical ID bracelet or chain. Carry a card that describes your disease and details of your medicine and dosage times.  What side effects may I  notice from receiving this medicine? Side effects that you should report to your doctor or health care professional as soon as possible: allergic reactions like skin rash, itching or hives, swelling of the face, lips, or tongue bone pain signs and symptoms of bleeding such as bloody or black, tarry stools; red or dark-brown urine; spitting up blood or brown material that looks like coffee grounds; red spots on the skin; unusual bruising or bleeding from the eye, gums, or nose signs and symptoms of a blood clot such as chest pain; shortness of breath; pain, swelling, or warmth in the leg signs and symptoms of a stroke such as changes in vision; confusion; trouble speaking or understanding; severe headaches; sudden numbness or weakness of the face, arm or leg; trouble walking; dizziness; loss of coordination Side effects that usually do not require medical attention (report to your doctor or health care professional if they continue or are bothersome): hair loss pain, redness, or irritation at site where injected This list may not describe all possible side effects. Call your doctor for medical advice about side effects. You may report side effects to FDA at 1-800-FDA-1088.  Where should I keep my medicine? Keep out of the reach of children. Store at room temperature between 15 and 30 degrees C (59 and 86 degrees F). Do not freeze. If your injections have been specially prepared, you may need to store them in the refrigerator. Ask your pharmacist. Throw away any unused medicine after the expiration date.   NOTE: This sheet is a summary. It may not cover all possible information. If you have questions about this medicine, talk to your doctor, pharmacist, or health care provider.  2020 Elsevier/Gold Standard (2017-08-09 11:25:34)   =============================================  Anticoagulant Injection Instructions Using a Prefilled Syringe    Injectable blood thinners (anticoagulants) are  medicines that help prevent blood clots from developing. Two common injectable anticoagulant medicines that are used are fondaparinux and enoxaparin. Injectable anticoagulant medicines are given with a single-use syringe that already has medicine inside of it (prefilled syringe). You inject the medicine through a needle into the layer of fat and tissue between skin and muscle (subcutaneous) in your belly. Before your first injection, your health care provider will instruct you on how to take your anticoagulant medicine at home. Also read the medication guide or package insert that came with the prefilled syringe. Follow directions from the guide about how to prepare and give the injection.  What are the risks? Generally, self-injection of anticoagulant medicine is safe. However, mild problems can occur, including mild bleeding, itching, or rash at the injection site. Other risks may include: Bleeding and bruising. A low platelet count (thrombocytopenia). An allergic reaction to the medication. Liver damage. A low red blood cell count (anemia).  Supplies needed: To inject the medicine, you will need: Alcohol wipes. A prefilled syringe with needle. A container for syringe disposal. This may be a puncture-proof sharps container or a hard-sided plastic container with a cover, such as an empty laundry detergent bottle.  How to inject anticoagulant medicine Wash your hands with soap and water. If soap and water are not available, use alcohol-based hand sanitizer. Locate the site on your belly where the medicine should be injected.  Avoid the area within 2 inches (5 cm) of your navel (umbilicus). Use an alcohol wipe to clean the site where you will be injecting the needle. Let the site air-dry. Remove the plastic cover from the needle on the syringe. Do not let the needle touch anything. Hold the syringe with one hand and use your other hand to twist the rigid needle guard (covering the needle)  counterclockwise. Pull the needle guard straight off the needle. Discard the needle guard. When using a prefilled syringe, do not push the air bubble out of the syringe before the injection. The air bubble will help you get all of the medicine out of the syringe and into your subcutaneous tissue. Hold the syringe in your writing hand like a pencil. Use your other hand to pinch and hold about an inch (2.5 cm) of skin. Do not directly touch the cleaned part of the skin. Insert the entire needle straight into the fold of skin. The needle should be at a 90-degree angle (perpendicular) to the skin. Push the needle all the way against the skin. After the needle is completely inserted into the skin, release the skin that you are pinching. Continue to hold the syringe with your writing hand. Use the thumb or index finger of your writing hand to push the plunger all the way into the syringe to inject the medicine. Pull the needle straight out of the skin. Press and hold an alcohol wipe over the injection site until the bleeding stops. Do not rub the area. Cover the injection site with a bandage, if needed. Do not recap the needle. If your syringe has a safety system for shielding the needle after injection: Firmly push down on the plunger after you complete the injection. The protective sleeve will automatically cover the needle, and you will hear a click. The click means that the needle is safely covered. Place the syringe and needle in the disposal container.  What else do I need to know? Do not use the syringe or needle more than one time. Change the injection site each time you give yourself a shot. Before an injection, make sure the medicine is a clear and colorless or pale yellow. Do not use your medicine if it is discolored or has particles in it. Let your health care provider know right away. Tell your health care providers, including dentists: That you are taking an anticoagulant, especially if  you are injured or plan to have a procedure. If there is a change in your illness, and any other changes in medicines, supplements, or diet. Take over-the-counter and prescription medicines only as told by your health care provider. Keep your medicine safely stored at room temperature. Keep all follow-up visits as told by your health care provider.  Contact a health care provider if: You develop any rashes on your skin. You have large areas of bruising on your skin. Your condition worsens. You develop a fever. There is blood in your urine. You are bleeding from your gums.  Get help right away if: You develop bleeding problems such as: Vomiting blood or coughing up blood. Dark red blood in your urine. Blood in your stool or your stool has a dark, tarry, or coffee-ground appearance. You have bleeding that does not stop. You develop chest pain or shortness of breath. You develop a severe headache or confusion.  These symptoms may represent a serious problem that is an emergency. Do not wait to see if the symptoms will go away. Get medical  help right away. Call your local emergency services (911 in the [place] Do not drive yourself to the hospital. Summary Injectable blood thinners (anticoagulants) are medicines that help prevent blood clots from developing in the veins. You inject the medicine through a needle into the layer of fat and tissue between skin and muscle (subcutaneous) in your belly. Keep all follow-up visits as told by your health care provider. Follow-up visits include visits for lab tests. This information is not intended to replace advice given to you by your health care provider. Make sure you discuss any questions you have with your health care provider. Document Revised: 09/22/2017 Document Reviewed: 09/22/2017 Elsevier Patient Education  2020 [employer].

## 2022-05-18 ENCOUNTER — Ambulatory Visit (INDEPENDENT_AMBULATORY_CARE_PROVIDER_SITE_OTHER): Payer: Commercial Managed Care - HMO | Admitting: Obstetrics and Gynecology

## 2022-05-18 ENCOUNTER — Other Ambulatory Visit: Payer: Self-pay

## 2022-05-18 ENCOUNTER — Encounter: Payer: Self-pay | Admitting: Obstetrics and Gynecology

## 2022-05-18 VITALS — BP 123/78 | HR 75 | Wt 204.0 lb

## 2022-05-18 DIAGNOSIS — Z09 Encounter for follow-up examination after completed treatment for conditions other than malignant neoplasm: Secondary | ICD-10-CM

## 2022-05-18 MED ORDER — GABAPENTIN 300 MG PO CAPS: 300.0000 mg | ORAL_CAPSULE | Freq: Two times a day (BID) | ORAL | 0 refills | Status: DC | PRN

## 2022-05-18 NOTE — Progress Notes (Signed)
Obstetrics and Gynecology Visit Return Patient Evaluation  Appointment Date: 05/18/2022  Primary Care Provider: [facility]: [facility]  Chief Complaint: routine incision check  History of Present Illness:  Angela Hahn is a 46 y.o. s/p 9/12 diagnostic laparoscopy converted to TAH/BS due to large fibroid uterus; patient discharged to home on pod#3  Interval History: Since that time, she states that she is doing well but still noting the neuropathic pain on the right side of the incision.   Review of Systems: as noted in the History of Present Illness.  Medications:  Angela Hahn had no medications administered during this visit. Current Outpatient Medications  Medication Sig Dispense Refill   acetaminophen (TYLENOL) 325 MG tablet Take 2 tablets (650 mg total) by mouth every 6 (six) hours as needed. 30 tablet 1   aspirin EC 81 MG tablet Take 81 mg by mouth in the morning. Swallow whole.     atorvastatin (LIPITOR) 40 MG tablet TAKE 1 TABLET BY MOUTH DAILY 90 tablet 0   bisacodyl 5 MG EC tablet Take 1 tablet (5 mg total) by mouth daily as needed for moderate constipation. 10 tablet 0   diltiazem (CARDIZEM CD) 180 MG 24 hr capsule Take 1 capsule (180 mg total) by mouth daily. 90 capsule 3   enoxaparin (LOVENOX) 40 MG/0.4ML injection Inject 0.4 mLs (40 mg total) into the skin daily for 11 days. 4.4 mL 0   ferrous sulfate 325 (65 FE) MG tablet Take 1 tablet (325 mg total) by mouth every other day for 42 doses. 42 tablet 0   gabapentin (NEURONTIN) 300 MG capsule Take 1 capsule (300 mg total) by mouth 2 (two) times daily as needed (nerve pain). 42 capsule 0   ibuprofen (ADVIL) 600 MG tablet Take 1 tablet (600 mg total) by mouth every 6 (six) hours as needed. 30 tablet 0   LORazepam (ATIVAN) 1 MG tablet TAKE ONE TABLET BY MOUTH EVERY 8 HOURS AS NEEDED FOR ANXIETY 90 tablet 5   metoCLOPramide (REGLAN) 10 MG tablet Take 1 tablet (10 mg  total) by mouth every 8 (eight) hours as needed for nausea or vomiting. 20 tablet 0   oxyCODONE (OXY IR/ROXICODONE) 5 MG immediate release tablet Take 1-2 tablets (5-10 mg total) by mouth every 4 (four) hours as needed for severe pain. 35 tablet 0   polyethylene glycol powder (GLYCOLAX/MIRALAX) 17 GM/SCOOP powder Take 17 g by mouth daily. 238 g 0   simethicone (MYLICON) 80 MG chewable tablet Chew 1 tablet (80 mg total) by mouth 2 (two) times daily after a meal for 14 days. 28 tablet 0   triamcinolone cream (KENALOG) 0.1 % APPLY 1 APPLICATION TOPICALLY TWO TIMES A DAY (Patient taking differently: Apply 1 Application topically 2 (two) times daily as needed (eczema).) 15 g 0   scopolamine (TRANSDERM SCOP, 1.5 MG,) 1 MG/3DAYS Place 1 patch (1.5 mg total) onto the skin every 3 (three) days. (Patient not taking: Reported on 05/18/2022) 10 patch 0   No current facility-administered medications for this visit.    Allergies: is allergic to gadolinium derivatives, hydrocodone, and tramadol.  Physical Exam:  BP 123/78   Pulse 75   Wt 204 lb (92.5 kg)   LMP 04/26/2022 Comment: Urine preg negative 05/09/22  BMI 32.93 kg/m  Body mass index is 32.93 kg/m. General appearance: Well nourished, well developed female in no acute distress.  Abdomen: diffusely non tender to palpation, non distended, and no masses,  hernias. Incision c/d/I, steri strips removed.  Neuro/Psych:  Normal mood and affect.    Assessment: patient doing well  Plan: Will increase her her gabapentin from 100 to 300 bid  RTC: 39mfor post op  CDurene RomansMD Attending Center for [employer]([facility]

## 2022-05-26 ENCOUNTER — Ambulatory Visit: Payer: Commercial Managed Care - HMO | Admitting: Family Medicine

## 2022-05-29 ENCOUNTER — Ambulatory Visit (INDEPENDENT_AMBULATORY_CARE_PROVIDER_SITE_OTHER): Payer: Commercial Managed Care - HMO | Admitting: Family Medicine

## 2022-05-29 ENCOUNTER — Encounter: Payer: Self-pay | Admitting: Family Medicine

## 2022-05-29 VITALS — BP 110/60 | HR 79 | Temp 98.1°F | Wt 206.0 lb

## 2022-05-29 DIAGNOSIS — R1030 Lower abdominal pain, unspecified: Secondary | ICD-10-CM | POA: Diagnosis not present

## 2022-05-29 LAB — POCT URINALYSIS DIPSTICK
Bilirubin, UA: NEGATIVE
Blood, UA: NEGATIVE
Glucose, UA: NEGATIVE
Ketones, UA: NEGATIVE
Leukocytes, UA: NEGATIVE
Nitrite, UA: NEGATIVE
Protein, UA: POSITIVE — AB
Spec Grav, UA: >=1.03 — AB (ref 1.010–1.025)
Urobilinogen, UA: 1 E.U./dL
pH, UA: 6 (ref 5.0–8.0)

## 2022-05-29 NOTE — Addendum Note (Signed)
Addended by: Wyvonne Lenz on: 05/29/2022 11:56 AM   Modules accepted: Orders

## 2022-05-29 NOTE — Progress Notes (Signed)
   Subjective:    Patient ID: Angela Hahn, female    DOB: April 20, 1976, 46 y.o.   MRN: [id number]  HPI Here to check her abdomen after a recent surgery. On 05-12-22 she had a laparoscopic hysterectomy with bilateral splapingectomies per Dr. Ilda Basset. This went well. She was so anemic she received 3 units of blood. She then had a follow up visit with Dr. Ilda Basset on 05-18-22, and she was doing well. Today she complains of lower abdominal pains that come and go. No fever. No urinary symptoms. She has been mildly constipated. She was on Oxycodone for a few days after the surgery, but now she is using Tylenol and Ibuprofen. Her UA today is clear.    Review of Systems  Constitutional: Negative.   Respiratory: Negative.    Cardiovascular: Negative.   Gastrointestinal:  Positive for abdominal pain and constipation. Negative for abdominal distention, anal bleeding, blood in stool, diarrhea, nausea, rectal pain and vomiting.  Genitourinary: Negative.        Objective:   Physical Exam Constitutional:      Appearance: Normal appearance.     Comments: Wearing an abdominal binder   Cardiovascular:     Rate and Rhythm: Normal rate and regular rhythm.     Pulses: Normal pulses.     Heart sounds: Normal heart sounds.  Pulmonary:     Effort: Pulmonary effort is normal.     Breath sounds: Normal breath sounds.  Abdominal:     General: Abdomen is flat. Bowel sounds are normal. There is no distension.     Palpations: Abdomen is soft. There is no mass.     Tenderness: There is no right CVA tenderness, left CVA tenderness, guarding or rebound.     Hernia: No hernia is present.     Comments: She is mildly tender across the lower abdomen   Neurological:     Mental Status: She is alert.           Assessment & Plan:  Her abdominal pain is likely due to a combination of post-surgical pain and constipation. I advised her to get back on Miralax every day and to drink plenty of water. Recheck as needed.   Alysia Penna, MD

## 2022-06-05 ENCOUNTER — Other Ambulatory Visit: Payer: Self-pay | Admitting: Obstetrics and Gynecology

## 2022-06-07 ENCOUNTER — Telehealth: Payer: Self-pay | Admitting: Family Medicine

## 2022-06-07 NOTE — Telephone Encounter (Signed)
Patient had sx on 05/09/22 by Dr. Ilda Basset in [place].   Should patient get OOW note through Dr. Ilda Basset office?

## 2022-06-07 NOTE — Telephone Encounter (Signed)
Pt needs excuse to reflect she is on restriction from extended walking, etc. States she had surgery on 05/09/2022

## 2022-06-08 NOTE — Telephone Encounter (Signed)
Spoke with patient, message given, she stated she has taken care of getting the OOW note.

## 2022-06-08 NOTE — Telephone Encounter (Signed)
She needs to get this from Dr. Ilda Basset

## 2022-06-19 ENCOUNTER — Ambulatory Visit (INDEPENDENT_AMBULATORY_CARE_PROVIDER_SITE_OTHER): Payer: Commercial Managed Care - HMO | Admitting: Obstetrics and Gynecology

## 2022-06-19 ENCOUNTER — Encounter: Payer: Self-pay | Admitting: Obstetrics and Gynecology

## 2022-06-19 ENCOUNTER — Other Ambulatory Visit: Payer: Self-pay

## 2022-06-19 VITALS — BP 120/75 | HR 84

## 2022-06-19 DIAGNOSIS — Z09 Encounter for follow-up examination after completed treatment for conditions other than malignant neoplasm: Secondary | ICD-10-CM

## 2022-06-19 DIAGNOSIS — K5900 Constipation, unspecified: Secondary | ICD-10-CM

## 2022-06-19 MED ORDER — POLYETHYLENE GLYCOL 3350 17 G PO PACK: 17.0000 g | PACK | Freq: Every day | ORAL | 1 refills | Status: DC

## 2022-06-19 NOTE — Progress Notes (Signed)
Obstetrics and Gynecology Visit Return Patient Evaluation  Appointment Date: 06/19/2022  Primary Care Provider: [facility]: [facility] for [facility]  Chief Complaint: routine postop check  History of Present Illness:  Angela Hahn is a 46 y.o. s/p 9/12 diagnostic laparoscopy converted to TAH/BS due to large fibroid uterus; patient discharged to home on pod#3 and seen on 9/21 for incision check and was doing well.   Interval History: Since that time, she states that she is doing well and no complaints or issues except constipation.   Review of Systems:  as noted in the History of Present Illness.  Patient Active Problem List   Diagnosis Date Noted   Postoperative anemia 05/12/2022   Abnormal uterine bleeding (AUB) 05/09/2022   Status post hysterectomy 05/09/2022   Fibroid 03/18/2022   Dysmenorrhea 03/07/2022   Iron deficiency anemia 01/26/2022   HTN (hypertension) 09/05/2021   Anxiety disorder 07/07/2021   Mixed hyperlipidemia 07/04/2021   CVA (cerebral vascular accident) ([place]) 06/23/2021   Menorrhagia with regular cycle 10/27/2020   Pain of right thumb 06/04/2018   Chronic low back pain 06/18/2012   Medications:  Angela Hahn had no medications administered during this visit. Current Outpatient Medications  Medication Sig Dispense Refill   acetaminophen (TYLENOL) 325 MG tablet Take 2 tablets (650 mg total) by mouth every 6 (six) hours as needed. 30 tablet 1   aspirin EC 81 MG tablet Take 81 mg by mouth in the morning. Swallow whole.     atorvastatin (LIPITOR) 40 MG tablet TAKE 1 TABLET BY MOUTH DAILY 90 tablet 0   bisacodyl 5 MG EC tablet Take 1 tablet (5 mg total) by mouth daily as needed for moderate constipation. 10 tablet 0   diltiazem (CARDIZEM CD) 180 MG 24 hr capsule Take 1 capsule (180 mg total) by mouth daily. 90 capsule 3   ferrous sulfate 325 (65 FE) MG tablet Take 1 tablet (325 mg total) by mouth every other day for  42 doses. 42 tablet 0   LORazepam (ATIVAN) 1 MG tablet TAKE ONE TABLET BY MOUTH EVERY 8 HOURS AS NEEDED FOR ANXIETY 90 tablet 5   polyethylene glycol (MIRALAX / GLYCOLAX) 17 g packet Take 17 g by mouth daily. 30 each 1   triamcinolone cream (KENALOG) 0.1 % APPLY 1 APPLICATION TOPICALLY TWO TIMES A DAY (Patient taking differently: Apply 1 Application topically 2 (two) times daily as needed (eczema).) 15 g 0   gabapentin (NEURONTIN) 300 MG capsule Take 1 capsule (300 mg total) by mouth 2 (two) times daily as needed (nerve pain). (Patient not taking: Reported on 06/19/2022) 42 capsule 0   ibuprofen (ADVIL) 600 MG tablet Take 1 tablet (600 mg total) by mouth every 6 (six) hours as needed. (Patient not taking: Reported on 06/19/2022) 30 tablet 0   metoCLOPramide (REGLAN) 10 MG tablet Take 1 tablet (10 mg total) by mouth every 8 (eight) hours as needed for nausea or vomiting. (Patient not taking: Reported on 06/19/2022) 20 tablet 0   oxyCODONE (OXY IR/ROXICODONE) 5 MG immediate release tablet Take 1-2 tablets (5-10 mg total) by mouth every 4 (four) hours as needed for severe pain. (Patient not taking: Reported on 06/19/2022) 35 tablet 0   scopolamine (TRANSDERM SCOP, 1.5 MG,) 1 MG/3DAYS Place 1 patch (1.5 mg total) onto the skin every 3 (three) days. (Patient not taking: Reported on 06/19/2022) 10 patch 0   simethicone (MYLICON) 80 MG chewable tablet Chew 1 tablet (80 mg total)  by mouth 2 (two) times daily after a meal for 14 days. 28 tablet 0   No current facility-administered medications for this visit.    Allergies: is allergic to gadolinium derivatives, hydrocodone, and tramadol.  Physical Exam:  BP 120/75   Pulse 84   LMP 04/26/2022 Comment: Urine preg negative 05/09/22 There is no height or weight on file to calculate BMI. General appearance: Well nourished, well developed female in no acute distress.  Abdomen: diffusely non tender to palpation, non distended, and no masses, hernias. Incision  c/d/i Neuro/Psych:  Normal mood and affect.    Pelvic: Declines today due to having BMs  Assessment: pt doing well  Plan:  1. Constipation, unspecified constipation type Last bm 2-3d ago. She took some miralax and dulcolax today and having BMs so declines exam today. I told her I recommend just taking the miralax daily to help keep her regular  2. Postop check Doing well. Pt okay to come off all restrictions with exercise on 11/1. Pelvic rest until exam in mid november   RTC: mid nov for pelvic to evaluate cuff  Durene Romans MD Attending Center for [facility])

## 2022-07-11 ENCOUNTER — Ambulatory Visit (HOSPITAL_COMMUNITY)
Admission: EM | Admit: 2022-07-11 | Discharge: 2022-07-11 | Disposition: A | Discharge status: Home / Self Care | Payer: Commercial Managed Care - HMO | Attending: Emergency Medicine | Admitting: Emergency Medicine

## 2022-07-11 ENCOUNTER — Encounter (HOSPITAL_COMMUNITY): Payer: Self-pay | Admitting: Emergency Medicine

## 2022-07-11 DIAGNOSIS — M62838 Other muscle spasm: Secondary | ICD-10-CM

## 2022-07-11 DIAGNOSIS — S29012A Strain of muscle and tendon of back wall of thorax, initial encounter: Secondary | ICD-10-CM

## 2022-07-11 MED ORDER — LIDOCAINE 5 % EX PTCH: 1.0000 | MEDICATED_PATCH | CUTANEOUS | 0 refills | Status: AC

## 2022-07-11 MED ORDER — KETOROLAC TROMETHAMINE 30 MG/ML IJ SOLN
INTRAMUSCULAR | Status: AC
  Filled 2022-07-11: qty 1

## 2022-07-11 MED ORDER — KETOROLAC TROMETHAMINE 30 MG/ML IJ SOLN
30.0000 mg | Freq: Once | INTRAMUSCULAR | Status: AC
  Administered 2022-07-11: 30 mg via INTRAMUSCULAR

## 2022-07-11 MED ORDER — CYCLOBENZAPRINE HCL 10 MG PO TABS: 10.0000 mg | ORAL_TABLET | Freq: Two times a day (BID) | ORAL | 0 refills | Status: AC | PRN

## 2022-07-11 NOTE — ED Provider Notes (Signed)
[place]    CSN: [id number] Arrival date & time: 07/11/22  1705     History   Chief Complaint Chief Complaint  Patient presents with   Back Pain    HPI Angela Hahn is a 46 y.o. female.  Presents with 3 day history of right upper back pain Tightness, spasm, comes on randomly but sometimes with movement and laying on it  Has tried tylenol, ibuprofen No known injury or trauma, but reports she has been sleeping differently   No paresthesia or weakness   Past Medical History:  Diagnosis Date   Anemia    Anxiety    Arthritis    7-8 years ago patient was told she had degenerative arthritis in her back   Chronic headaches    Concussion with loss of consciousness 09/24/2015   COVID-19 virus infection 03/08/2021   Dysmenorrhea    Heart murmur    dx'd since childhood   Sleep apnea    Stroke [facility])     Patient Active Problem List   Diagnosis Date Noted   Postoperative anemia 05/12/2022   Abnormal uterine bleeding (AUB) 05/09/2022   Status post hysterectomy 05/09/2022   Fibroid 03/18/2022   Dysmenorrhea 03/07/2022   Iron deficiency anemia 01/26/2022   HTN (hypertension) 09/05/2021   Anxiety disorder 07/07/2021   Mixed hyperlipidemia 07/04/2021   CVA (cerebral vascular accident) ([place]) 06/23/2021   Menorrhagia with regular cycle 10/27/2020   Pain of right thumb 06/04/2018   Chronic low back pain 06/18/2012    Past Surgical History:  Procedure Laterality Date   BUBBLE STUDY  07/14/2021   Procedure: BUBBLE STUDY;  Surgeon: Skeet Latch, MD;  Location: [place];  Service: Cardiovascular;;   HYSTERECTOMY ABDOMINAL WITH SALPINGECTOMY Bilateral 05/09/2022   Procedure: HYSTERECTOMY ABDOMINAL WITH BILATERAL SALPINGECTOMY;  Surgeon: Aletha Halim, MD;  Location: [place];  Service: Gynecology;  Laterality: Bilateral;   LAPAROSCOPY N/A 05/09/2022   Procedure: LAPAROSCOPY DIAGNOSTIC;  Surgeon: Aletha Halim, MD;  Location: [place];  Service: Gynecology;   Laterality: N/A;   TEE WITHOUT CARDIOVERSION N/A 07/14/2021   Procedure: TRANSESOPHAGEAL ECHOCARDIOGRAM (TEE);  Surgeon: Skeet Latch, MD;  Location: [facility] ENDOSCOPY;  Service: Cardiovascular;  Laterality: N/A;   TUBAL LIGATION      OB History     Gravida  3   Para  3   Term  3   Preterm      AB      Living  3      SAB      IAB      Ectopic      Multiple      Live Births           Obstetric Comments  SVD x 3.           Home Medications    Prior to Admission medications   Medication Sig Start Date End Date Taking? Authorizing Provider  cyclobenzaprine (FLEXERIL) 10 MG tablet Take 1 tablet (10 mg total) by mouth 2 (two) times daily as needed for up to 5 days for muscle spasms. 07/11/22 07/16/22 Yes Amahd Morino, PA-C  lidocaine (LIDODERM) 5 % Place 1 patch onto the skin daily for 5 days. Remove & Discard patch within 12 hours 07/11/22 07/16/22 Yes Tonee Silverstein, Wells Guiles, PA-C  acetaminophen (TYLENOL) 325 MG tablet Take 2 tablets (650 mg total) by mouth every 6 (six) hours as needed. 05/12/22   Aletha Halim, MD  aspirin EC 81 MG tablet Take 81 mg by mouth in the  morning. Swallow whole.    [provider]  atorvastatin (LIPITOR) 40 MG tablet TAKE 1 TABLET BY MOUTH DAILY 03/20/22   Laurey Morale, MD  bisacodyl 5 MG EC tablet Take 1 tablet (5 mg total) by mouth daily as needed for moderate constipation. 05/12/22   Aletha Halim, MD  diltiazem (CARDIZEM CD) 180 MG 24 hr capsule Take 1 capsule (180 mg total) by mouth daily. 10/04/21 04/26/23  Tobb, Godfrey Pick, DO  ferrous sulfate 325 (65 FE) MG tablet Take 1 tablet (325 mg total) by mouth every other day for 42 doses. 05/12/22 08/03/22  Aletha Halim, MD  LORazepam (ATIVAN) 1 MG tablet TAKE ONE TABLET BY MOUTH EVERY 8 HOURS AS NEEDED FOR ANXIETY 03/22/22   Laurey Morale, MD  polyethylene glycol (MIRALAX / GLYCOLAX) 17 g packet Take 17 g by mouth daily. 06/19/22   Aletha Halim, MD  simethicone (MYLICON) 80 MG  chewable tablet Chew 1 tablet (80 mg total) by mouth 2 (two) times daily after a meal for 14 days. 05/12/22 05/26/22  Aletha Halim, MD  triamcinolone cream (KENALOG) 0.1 % APPLY 1 APPLICATION TOPICALLY TWO TIMES A DAY Patient taking differently: Apply 1 Application topically 2 (two) times daily as needed (eczema). 04/24/22   Laurey Morale, MD  Cetirizine HCl 10 MG CAPS Take 1 capsule (10 mg total) by mouth daily. 08/28/19 12/17/20  Wieters, Elesa Hacker, PA-C    Family History Family History  Problem Relation Age of Onset   Arthritis Mother    Depression Mother    Hypertension Mother    Diabetes Mother    Hypertension Father    Diabetes Father     Social History Social History   Tobacco Use   Smoking status: Former    Types: Cigarettes    Quit date: 2020    Years since quitting: 3.8   Smokeless tobacco: Never  Vaping Use   Vaping Use: Some days  Substance Use Topics   Alcohol use: No    Alcohol/week: 0.0 standard drinks of alcohol   Drug use: No     Allergies   Gadolinium derivatives, Hydrocodone, and Tramadol   Review of Systems Review of Systems  Musculoskeletal:  Positive for back pain.   Per HPI  Physical Exam Triage Vital Signs ED Triage Vitals  Enc Vitals Group     BP 07/11/22 1807 (!) 148/89     Pulse Rate 07/11/22 1807 82     Resp 07/11/22 1807 18     Temp 07/11/22 1807 99.2 F (37.3 C)     Temp Source 07/11/22 1807 Oral     SpO2 07/11/22 1807 100 %     Weight --      Height --      Head Circumference --      Peak Flow --      Pain Score 07/11/22 1804 10     Pain Loc --      Pain Edu? --      Excl. in [place]? --    No data found.  Updated Vital Signs BP (!) 148/89 (BP Location: Left Arm)   Pulse 82   Temp 99.2 F (37.3 C) (Oral)   Resp 18   LMP 04/26/2022 Comment: Urine preg negative 05/09/22  SpO2 100%    Physical Exam Vitals and nursing note reviewed.  Constitutional:      General: She is not in acute distress.    Appearance: She is  not ill-appearing.  HENT:  Mouth/Throat:     Mouth: Mucous membranes are moist.     Pharynx: Oropharynx is clear.  Eyes:     Pupils: Pupils are equal, round, and reactive to light.  Cardiovascular:     Rate and Rhythm: Normal rate and regular rhythm.     Heart sounds: Normal heart sounds.  Pulmonary:     Effort: Pulmonary effort is normal.     Breath sounds: Normal breath sounds.  Musculoskeletal:        General: Normal range of motion.     Cervical back: Normal range of motion.     Thoracic back: Spasms and tenderness present. No bony tenderness.       Back:     Comments: Full ROM upper extrem. Tender right thoracic paraspinals with tightness and spasm focal point. No spinal tenderness   Skin:    Findings: No rash.  Neurological:     General: No focal deficit present.     Mental Status: She is alert and oriented to person, place, and time.     Sensory: Sensation is intact.     Motor: Motor function is intact.     Deep Tendon Reflexes: Reflexes are normal and symmetric.     Comments: Strength 5/5 all extremities     UC Treatments / Results  Labs (all labs ordered are listed, but only abnormal results are displayed) Labs Reviewed - No data to display  EKG   Radiology No results found.  Procedures Procedures (including critical care time)  Medications Ordered in UC Medications  ketorolac (TORADOL) 30 MG/ML injection 30 mg (30 mg Intramuscular Given 07/11/22 1831)    Initial Impression / Assessment and Plan / UC Course  I have reviewed the triage vital signs and the nursing notes.  Pertinent labs & imaging results that were available during my care of the patient were reviewed by me and considered in my medical decision making (see chart for details).  Likely muscular given tenderness to palpation with spasm felt No red flags Offered Toradol injection for pain, patient agrees  Reports improvement in pain after medication Discussed use of tylenol with  muscle relaxer BID prn, can apply lidocaine patch to area for extra relief Return precautions discussed. Patient agrees to plan  Final Clinical Impressions(s) / UC Diagnoses   Final diagnoses:  Muscle strain of right upper back, initial encounter  Muscle spasm     Discharge Instructions      I recommend using tylenol every 4-6 hours as needed. You can alternate with ibuprofen as well.  You can take the muscle relaxer twice daily. If the medication makes you drowsy, take only at bed time.  Apply lidocaine patch to the area, you can leave on for 12 hours at a time.     ED Prescriptions     Medication Sig Dispense Auth. Provider   lidocaine (LIDODERM) 5 % Place 1 patch onto the skin daily for 5 days. Remove & Discard patch within 12 hours 5 patch Shantina Chronister, PA-C   cyclobenzaprine (FLEXERIL) 10 MG tablet Take 1 tablet (10 mg total) by mouth 2 (two) times daily as needed for up to 5 days for muscle spasms. 10 tablet Amani Nodarse, Wells Guiles, PA-C      PDMP not reviewed this encounter.   Kyra Leyland 07/11/22 1905

## 2022-07-11 NOTE — Discharge Instructions (Addendum)
I recommend using tylenol every 4-6 hours as needed. You can alternate with ibuprofen as well.  You can take the muscle relaxer twice daily. If the medication makes you drowsy, take only at bed time.  Apply lidocaine patch to the area, you can leave on for 12 hours at a time.

## 2022-07-11 NOTE — ED Triage Notes (Addendum)
Pt reports upper back pain. States back pain feels like a "crook" and feels likes its radiating into right shoulder x 3 days. Reports last week the back pain and and went away.   Also reports feeling lightheaded for the last couple days. Requesting her Hgb to be checked due to history of needing blood transfusions.

## 2022-07-15 ENCOUNTER — Other Ambulatory Visit: Payer: Self-pay | Admitting: Cardiology

## 2022-07-19 ENCOUNTER — Ambulatory Visit: Payer: Commercial Managed Care - HMO | Admitting: Obstetrics & Gynecology

## 2022-07-19 NOTE — Progress Notes (Signed)
   Patient did not show up today for her scheduled appointment.   Caran Storck, MD, FACOG Obstetrician & Gynecologist, [facility], [facility]  

## 2022-08-04 ENCOUNTER — Ambulatory Visit: Payer: Commercial Managed Care - HMO | Admitting: Adult Health

## 2022-08-07 ENCOUNTER — Other Ambulatory Visit: Payer: Self-pay

## 2022-08-07 ENCOUNTER — Telehealth: Payer: Self-pay | Admitting: Family Medicine

## 2022-08-07 ENCOUNTER — Ambulatory Visit: Payer: Commercial Managed Care - HMO | Admitting: Family Medicine

## 2022-08-07 DIAGNOSIS — E785 Hyperlipidemia, unspecified: Secondary | ICD-10-CM

## 2022-08-07 NOTE — Telephone Encounter (Signed)
Pt has new pharm and would like refills on atorvastatin (LIPITOR) 40 MG tablet  and aspirin EC 81 MG tablet  90 day supply [id number] - [place], [place] - [address] Phone: [phone]  Fax: [phone]

## 2022-08-08 MED ORDER — ATORVASTATIN CALCIUM 40 MG PO TABS: 40.0000 mg | ORAL_TABLET | Freq: Every day | ORAL | 3 refills | Status: DC

## 2022-08-08 MED ORDER — ASPIRIN 81 MG PO TBEC: 81.0000 mg | DELAYED_RELEASE_TABLET | Freq: Every day | ORAL | 3 refills | Status: DC

## 2022-08-08 NOTE — Telephone Encounter (Signed)
Done

## 2022-08-08 NOTE — Telephone Encounter (Signed)
Last OV-05/29/22 Last refill for Aspirin-sent in by historical provider. Last Lipid labs-07/27/21  No future OV scheduled Please advise if okay to sent refill

## 2022-09-07 ENCOUNTER — Encounter (HOSPITAL_COMMUNITY): Payer: Self-pay

## 2022-09-07 ENCOUNTER — Ambulatory Visit (HOSPITAL_COMMUNITY)
Admission: EM | Admit: 2022-09-07 | Discharge: 2022-09-07 | Disposition: A | Discharge status: Home / Self Care | Payer: Medicaid Other | Attending: Emergency Medicine | Admitting: Emergency Medicine

## 2022-09-07 DIAGNOSIS — R1032 Left lower quadrant pain: Secondary | ICD-10-CM | POA: Diagnosis not present

## 2022-09-07 LAB — POCT URINALYSIS DIPSTICK, ED / UC
Bilirubin Urine: NEGATIVE
Glucose, UA: NEGATIVE mg/dL
Ketones, ur: NEGATIVE mg/dL
Leukocytes,Ua: NEGATIVE
Nitrite: NEGATIVE
Protein, ur: NEGATIVE mg/dL
Specific Gravity, Urine: 1.02 (ref 1.005–1.030)
Urobilinogen, UA: 0.2 mg/dL (ref 0.0–1.0)
pH: 7.5 (ref 5.0–8.0)

## 2022-09-07 NOTE — Discharge Instructions (Signed)
As discussed, please call your ob/gyn first thing in the morning to schedule follow up.  If at any point symptoms change or worsen, please go directly to the emergency department.

## 2022-09-07 NOTE — ED Provider Notes (Signed)
[place]    CSN: [id number] Arrival date & time: 09/07/22  1808     History   Chief Complaint Chief Complaint  Patient presents with   Abdominal Pain   Vaginal Bleeding   Urinary Frequency    HPI Angela Hahn is a 47 y.o. female.  2 week hx intermittent LLQ cramping, light spotting Has spotting about 3x per day, she will wipe and see light pink on the toilet paper. No heavy bleeding or passage of clots. First happened after intercourse. Denies severe pain. Feels like menstrual cramps when it happens. Reports some pressure with urination. No dysuria, frequency, urgency No fevers.  No vomiting, diarrhea, or constipation  Last BM was today, normal  Past Medical History:  Diagnosis Date   Anemia    Anxiety    Arthritis    7-8 years ago patient was told she had degenerative arthritis in her back   Chronic headaches    Concussion with loss of consciousness 09/24/2015   COVID-19 virus infection 03/08/2021   Dysmenorrhea    Heart murmur    dx'd since childhood   Sleep apnea    Stroke [facility])     Patient Active Problem List   Diagnosis Date Noted   Postoperative anemia 05/12/2022   Abnormal uterine bleeding (AUB) 05/09/2022   Status post hysterectomy 05/09/2022   Fibroid 03/18/2022   Dysmenorrhea 03/07/2022   Iron deficiency anemia 01/26/2022   HTN (hypertension) 09/05/2021   Anxiety disorder 07/07/2021   Mixed hyperlipidemia 07/04/2021   CVA (cerebral vascular accident) ([place]) 06/23/2021   Menorrhagia with regular cycle 10/27/2020   Pain of right thumb 06/04/2018   Chronic low back pain 06/18/2012    Past Surgical History:  Procedure Laterality Date   BUBBLE STUDY  07/14/2021   Procedure: BUBBLE STUDY;  Surgeon: Skeet Latch, MD;  Location: [place];  Service: Cardiovascular;;   HYSTERECTOMY ABDOMINAL WITH SALPINGECTOMY Bilateral 05/09/2022   Procedure: HYSTERECTOMY ABDOMINAL WITH BILATERAL SALPINGECTOMY;  Surgeon: Aletha Halim, MD;   Location: [place];  Service: Gynecology;  Laterality: Bilateral;   LAPAROSCOPY N/A 05/09/2022   Procedure: LAPAROSCOPY DIAGNOSTIC;  Surgeon: Aletha Halim, MD;  Location: [place];  Service: Gynecology;  Laterality: N/A;   TEE WITHOUT CARDIOVERSION N/A 07/14/2021   Procedure: TRANSESOPHAGEAL ECHOCARDIOGRAM (TEE);  Surgeon: Skeet Latch, MD;  Location: [facility] ENDOSCOPY;  Service: Cardiovascular;  Laterality: N/A;   TUBAL LIGATION      OB History     Gravida  3   Para  3   Term  3   Preterm      AB      Living  3      SAB      IAB      Ectopic      Multiple      Live Births           Obstetric Comments  SVD x 3.           Home Medications    Prior to Admission medications   Medication Sig Start Date End Date Taking? Authorizing Provider  acetaminophen (TYLENOL) 325 MG tablet Take 2 tablets (650 mg total) by mouth every 6 (six) hours as needed. 05/12/22   Aletha Halim, MD  aspirin EC 81 MG tablet Take 1 tablet (81 mg total) by mouth daily. Swallow whole. 08/08/22   Laurey Morale, MD  atorvastatin (LIPITOR) 40 MG tablet Take 1 tablet (40 mg total) by mouth daily. 08/08/22   Laurey Morale,  MD  bisacodyl 5 MG EC tablet Take 1 tablet (5 mg total) by mouth daily as needed for moderate constipation. 05/12/22   Aletha Halim, MD  diltiazem (CARDIZEM CD) 180 MG 24 hr capsule TAKE 1 CAPLET BY MOUTH DAILY 07/17/22   Tobb, Kardie, DO  ferrous sulfate 325 (65 FE) MG tablet Take 1 tablet (325 mg total) by mouth every other day for 42 doses. 05/12/22 08/03/22  Aletha Halim, MD  LORazepam (ATIVAN) 1 MG tablet TAKE ONE TABLET BY MOUTH EVERY 8 HOURS AS NEEDED FOR ANXIETY 03/22/22   Laurey Morale, MD  polyethylene glycol (MIRALAX / GLYCOLAX) 17 g packet Take 17 g by mouth daily. 06/19/22   Aletha Halim, MD  simethicone (MYLICON) 80 MG chewable tablet Chew 1 tablet (80 mg total) by mouth 2 (two) times daily after a meal for 14 days. 05/12/22 05/26/22  Aletha Halim, MD   triamcinolone cream (KENALOG) 0.1 % APPLY 1 APPLICATION TOPICALLY TWO TIMES A DAY Patient taking differently: Apply 1 Application topically 2 (two) times daily as needed (eczema). 04/24/22   Laurey Morale, MD  Cetirizine HCl 10 MG CAPS Take 1 capsule (10 mg total) by mouth daily. 08/28/19 12/17/20  Wieters, Elesa Hacker, PA-C    Family History Family History  Problem Relation Age of Onset   Arthritis Mother    Depression Mother    Hypertension Mother    Diabetes Mother    Hypertension Father    Diabetes Father     Social History Social History   Tobacco Use   Smoking status: Former    Types: Cigarettes    Quit date: 2020    Years since quitting: 4.0   Smokeless tobacco: Never  Vaping Use   Vaping Use: Some days  Substance Use Topics   Alcohol use: No    Alcohol/week: 0.0 standard drinks of alcohol   Drug use: No     Allergies   Gadolinium derivatives, Hydrocodone, and Tramadol   Review of Systems Review of Systems As per HPI  Physical Exam Triage Vital Signs ED Triage Vitals  Enc Vitals Group     BP 09/07/22 1938 (!) 152/90     Pulse Rate 09/07/22 1938 82     Resp 09/07/22 1938 16     Temp 09/07/22 1938 98.4 F (36.9 C)     Temp Source 09/07/22 1938 Oral     SpO2 09/07/22 1938 99 %     Weight 09/07/22 1938 205 lb 14.6 oz (93.4 kg)     Height 09/07/22 1938 '5\' 6"'$  (1.676 m)     Head Circumference --      Peak Flow --      Pain Score 09/07/22 1937 6     Pain Loc --      Pain Edu? --      Excl. in [place]? --    No data found.  Updated Vital Signs BP (!) 152/90 (BP Location: Right Arm)   Pulse 82   Temp 98.4 F (36.9 C) (Oral)   Resp 16   Ht '5\' 6"'$  (1.676 m)   Wt 205 lb 14.6 oz (93.4 kg)   LMP 04/26/2022 Comment: Urine preg negative 05/09/22  SpO2 99%   BMI 33.23 kg/m   Physical Exam Vitals and nursing note reviewed.  Constitutional:      General: She is not in acute distress. HENT:     Mouth/Throat:     Pharynx: Oropharynx is clear.   Cardiovascular:     Rate  and Rhythm: Normal rate and regular rhythm.     Pulses: Normal pulses.     Heart sounds: Normal heart sounds.  Pulmonary:     Effort: Pulmonary effort is normal.     Breath sounds: Normal breath sounds.  Abdominal:     General: A surgical scar is present. There is no distension.     Palpations: There is no mass.     Tenderness: There is no abdominal tenderness. There is no right CVA tenderness, left CVA tenderness, guarding or rebound.     Comments: Well healed surgical scars without sign of infection. Abd non tender  Musculoskeletal:        General: Normal range of motion.  Skin:    General: Skin is warm and dry.  Neurological:     Mental Status: She is alert and oriented to person, place, and time.     Motor: No weakness.      UC Treatments / Results  Labs (all labs ordered are listed, but only abnormal results are displayed) Labs Reviewed  POCT URINALYSIS DIPSTICK, ED / UC - Abnormal; Notable for the following components:      Result Value   Hgb urine dipstick SMALL (*)    All other components within normal limits    EKG  Radiology No results found.  Procedures Procedures (including critical care time)  Medications Ordered in UC Medications - No data to display  Initial Impression / Assessment and Plan / UC Course  I have reviewed the triage vital signs and the nursing notes.  Pertinent labs & imaging results that were available during my care of the patient were reviewed by me and considered in my medical decision making (see chart for details).  UA with small hgb. Abd non tender on exam. At this time no concerning signs although I recommend she contact her surgeon. Discussed follow up with ob/gyn, call first thing in the morning Discussed strict ED precautions for any change in symptoms, especially increasing pain, more bleeding, fever. Patient agrees to plan  Final Clinical Impressions(s) / UC Diagnoses   Final diagnoses:   Left lower quadrant abdominal pain     Discharge Instructions      As discussed, please call your ob/gyn first thing in the morning to schedule follow up.  If at any point symptoms change or worsen, please go directly to the emergency department.    ED Prescriptions   None    PDMP not reviewed this encounter.   Ennio Houp, Wells Guiles, [place] 09/08/22 1023

## 2022-09-07 NOTE — ED Triage Notes (Signed)
Chief Complaint: abdominal pain and spotting on and off for 2 weeks. No falls or any strenuous activities. Patient states with peeing there is pressure and urgency. Had hysterectomy 05/09/22.   Onset: 2 weeks  Prescriptions or OTC medications tried: Yes- ibuprofen     with little relief

## 2022-10-13 ENCOUNTER — Other Ambulatory Visit: Payer: Self-pay | Admitting: Cardiology

## 2022-10-14 ENCOUNTER — Other Ambulatory Visit: Payer: Self-pay | Admitting: Family Medicine

## 2022-10-16 ENCOUNTER — Other Ambulatory Visit: Payer: Self-pay | Admitting: Cardiology

## 2022-10-16 NOTE — Telephone Encounter (Signed)
Last OV=05/29/2022 Last refill-03/22/22--90 tabs, 5 refills  No future OV scheduled.

## 2022-10-23 ENCOUNTER — Other Ambulatory Visit: Payer: Self-pay | Admitting: Cardiology

## 2022-10-24 NOTE — Telephone Encounter (Signed)
Duplicate

## 2022-11-02 ENCOUNTER — Ambulatory Visit: Payer: Medicaid Other | Admitting: Internal Medicine

## 2022-11-14 ENCOUNTER — Other Ambulatory Visit: Payer: Self-pay

## 2022-11-14 MED ORDER — TRIAMCINOLONE ACETONIDE 0.1 % EX CREA: 1.0000 | TOPICAL_CREAM | Freq: Two times a day (BID) | CUTANEOUS | 0 refills | Status: AC | PRN

## 2022-11-21 ENCOUNTER — Ambulatory Visit: Payer: Medicaid Other | Admitting: Family Medicine

## 2022-11-28 ENCOUNTER — Ambulatory Visit: Payer: Medicaid Other | Admitting: Family Medicine

## 2022-12-24 ENCOUNTER — Other Ambulatory Visit: Payer: Self-pay | Admitting: Family Medicine

## 2022-12-28 ENCOUNTER — Other Ambulatory Visit: Payer: Self-pay | Admitting: Family Medicine

## 2023-01-19 ENCOUNTER — Other Ambulatory Visit: Payer: Self-pay | Admitting: Family Medicine

## 2023-02-20 ENCOUNTER — Emergency Department (HOSPITAL_COMMUNITY)
Admission: EM | Admit: 2023-02-20 | Discharge: 2023-02-20 | Disposition: A | Discharge status: Home / Self Care | Payer: Medicaid Other | Attending: Emergency Medicine | Admitting: Emergency Medicine

## 2023-02-20 ENCOUNTER — Emergency Department (HOSPITAL_COMMUNITY): Payer: Medicaid Other

## 2023-02-20 DIAGNOSIS — S0083XA Contusion of other part of head, initial encounter: Secondary | ICD-10-CM | POA: Diagnosis not present

## 2023-02-20 DIAGNOSIS — S0990XA Unspecified injury of head, initial encounter: Secondary | ICD-10-CM

## 2023-02-20 DIAGNOSIS — Z7982 Long term (current) use of aspirin: Secondary | ICD-10-CM | POA: Diagnosis not present

## 2023-02-20 DIAGNOSIS — W228XXA Striking against or struck by other objects, initial encounter: Secondary | ICD-10-CM | POA: Insufficient documentation

## 2023-02-20 DIAGNOSIS — Y92810 Car as the place of occurrence of the external cause: Secondary | ICD-10-CM | POA: Diagnosis not present

## 2023-02-20 MED ORDER — NAPROXEN 375 MG PO TABS: 375.0000 mg | ORAL_TABLET | Freq: Two times a day (BID) | ORAL | 0 refills | Status: DC

## 2023-02-20 MED ORDER — MECLIZINE HCL 25 MG PO TABS: 25.0000 mg | ORAL_TABLET | Freq: Three times a day (TID) | ORAL | 0 refills | Status: DC | PRN

## 2023-02-20 MED ORDER — DIPHENHYDRAMINE HCL 50 MG/ML IJ SOLN
12.5000 mg | Freq: Once | INTRAMUSCULAR | Status: AC
  Administered 2023-02-20: 12.5 mg via INTRAVENOUS
  Filled 2023-02-20: qty 1

## 2023-02-20 MED ORDER — KETOROLAC TROMETHAMINE 30 MG/ML IJ SOLN
30.0000 mg | Freq: Once | INTRAMUSCULAR | Status: AC
  Administered 2023-02-20: 30 mg via INTRAVENOUS
  Filled 2023-02-20: qty 1

## 2023-02-20 MED ORDER — PROCHLORPERAZINE EDISYLATE 10 MG/2ML IJ SOLN
5.0000 mg | Freq: Once | INTRAMUSCULAR | Status: AC
  Administered 2023-02-20: 5 mg via INTRAVENOUS
  Filled 2023-02-20: qty 2

## 2023-02-20 NOTE — ED Provider Notes (Signed)
[place] EMERGENCY DEPARTMENT AT [facility] Provider Note   CSN: [id number] Arrival date & time: 02/20/23  1643     History  No chief complaint on file.   Angela Hahn is a 47 y.o. female who presents emergency department chief complaint of head injury.  Patient states that she hit her head on the car door yesterday.  She had immediate nausea and dizziness and headache.  She was had to sit down for about 45 minutes.  Today she continues to have a frontal headache.  She had a large hematoma to the left scalp.  She has had nausea without vomiting, no confusion.  History of strokes.  She does not take any blood thinners.  HPI     Home Medications Prior to Admission medications   Medication Sig Start Date End Date Taking? Authorizing Provider  acetaminophen (TYLENOL) 325 MG tablet Take 2 tablets (650 mg total) by mouth every 6 (six) hours as needed. 05/12/22   [place] Bing, MD  aspirin EC 81 MG tablet Take 1 tablet (81 mg total) by mouth daily. Swallow whole. 08/08/22   Nelwyn Salisbury, MD  atorvastatin (LIPITOR) 40 MG tablet Take 1 tablet (40 mg total) by mouth daily. 08/08/22   Nelwyn Salisbury, MD  bisacodyl 5 MG EC tablet Take 1 tablet (5 mg total) by mouth daily as needed for moderate constipation. 05/12/22   [place] Bing, MD  diltiazem (CARDIZEM CD) 180 MG 24 hr capsule TAKE 1 CAPSULE BY MOUTH DAILY 10/13/22   Tobb, Kardie, DO  ferrous sulfate 325 (65 FE) MG tablet Take 1 tablet (325 mg total) by mouth every other day for 42 doses. 05/12/22 08/03/22  [place] Bing, MD  LORazepam (ATIVAN) 1 MG tablet TAKE 1 TABLET BY MOUTH EVERY 8 HOURS AS NEEDED FOR ANXIETY 10/16/22   Nelwyn Salisbury, MD  metoprolol succinate (TOPROL-XL) 25 MG 24 hr tablet Take 0.5 tablets (12.5 mg total) by mouth daily. Make an appointment with Cardiologist for further refills. 10/23/22   Tobb, Kardie, DO  polyethylene glycol (MIRALAX / GLYCOLAX) 17 g packet Take 17 g by mouth daily. 06/19/22   [place] Bing, MD  simethicone (MYLICON) 80 MG chewable tablet Chew 1 tablet (80 mg total) by mouth 2 (two) times daily after a meal for 14 days. 05/12/22 05/26/22  [place] Bing, MD  triamcinolone cream (KENALOG) 0.1 % Apply 1 Application topically 2 (two) times daily as needed (eczema). 11/14/22   Nelwyn Salisbury, MD  Cetirizine HCl 10 MG CAPS Take 1 capsule (10 mg total) by mouth daily. 08/28/19 12/17/20  Wieters, Hallie C, PA-C      Allergies    Gadolinium derivatives, Hydrocodone, and Tramadol    Review of Systems   Review of Systems  Physical Exam Updated Vital Signs BP 134/89 (BP Location: Right Arm)   Pulse 81   Temp 98.3 F (36.8 C) (Oral)   Resp 16   LMP 04/26/2022 Comment: Urine preg negative 05/09/22  SpO2 98%  Physical Exam Physical Exam  Nursing note and vitals reviewed. Constitutional: She is oriented to person, place, and time. She appears well-developed and well-nourished. No distress.  HENT:  Head: Normocephalic and small hematoma to the left forehead.  Eyes: Conjunctivae normal and EOM are normal. Pupils are equal, round, and reactive to light. No scleral icterus.  Neck: Normal range of motion.  Cardiovascular: Normal rate, regular rhythm and normal heart sounds.  Exam reveals no gallop and no friction rub.  No murmur heard. Pulmonary/Chest: Effort normal and breath sounds normal. No respiratory distress.  Abdominal: Soft. Bowel sounds are normal. She exhibits no distension and no mass. There is no tenderness. There is no guarding.  Neurological: She is alert and oriented to person, place, and time. Speech is clear and goal oriented, follows commands Major Cranial nerves without deficit, no facial droop Normal strength in upper and lower extremities bilaterally including dorsiflexion and plantar flexion, strong and equal grip strength Sensation normal to light and sharp touch Moves extremities without ataxia, coordination intact Normal finger to nose and rapid  alternating movements Neg romberg, no pronator drift Normal gait Normal heel-shin and balance  Skin: Skin is warm and dry. She is not diaphoretic.   ED Results / Procedures / Treatments   Labs (all labs ordered are listed, but only abnormal results are displayed) Labs Reviewed - No data to display  EKG None  Radiology No results found.  Procedures Procedures    Medications Ordered in ED Medications - No data to display  ED Course/ Medical Decision Making/ A&P                             Medical Decision Making Amount and/or Complexity of Data Reviewed Radiology: ordered.  Risk Prescription drug management.  Patient here with bad headache after head injury.  Symptoms consistent with concussion.  No evidence of skull fracture, subdural hematoma. Patient given Toradol Reglan and Benadryl here in the emergency department with complete resolution of her headache.  Will discharge with meclizine and naproxen.  Close outpatient follow-up with PCP.  Discussed home care and return precautions.  She appears otherwise appropriate for discharge at this time        Final Clinical Impression(s) / ED Diagnoses Final diagnoses:  None    Rx / DC Orders ED Discharge Orders     None         Arthor Captain, PA-C 02/20/23 2056    Maia Plan, MD 02/22/23 2316

## 2023-02-20 NOTE — ED Triage Notes (Signed)
Pt to ED via EMS from urgent care. Pt reports hitting her head on her car door yesterday. Pt has small hematoma above left eye brow. No LOC. Pt takes ASA at home, but no other blood thinners. Pt reports light headedness today and went to UC. Pt has hx of 2 strokes in 2022. Pt has hx of left sided deficits from previous stroke and reports worsening deficits on left side x1 month. Pt reports increased weakness and pain in bilateral legs and left arm x1 month. EMS stroke screen negative. Pt also endorses nausea after eating. GCS 15.   EMS: 68 HR 20 L Hand 154/75 100% RA 100 CBG

## 2023-02-20 NOTE — Discharge Instructions (Signed)
Get help right away if: You have sudden: Headache that is very bad. Vomiting that does not stop. Changes in the size of one of your pupils. Pupils are the black centers of your eyes. Changes in how you see (vision). More confusion or more grumpy moods. You have a seizure. Your symptoms get worse. You have a clear or bloody fluid coming from your nose or ears. These symptoms may be an emergency. Get help right away. Call 911. Do not wait to see if the symptoms will go away. Do not drive yourself to the hospital. 

## 2023-02-22 ENCOUNTER — Other Ambulatory Visit: Payer: Self-pay

## 2023-02-22 ENCOUNTER — Emergency Department (HOSPITAL_COMMUNITY)
Admission: EM | Admit: 2023-02-22 | Discharge: 2023-02-22 | Disposition: A | Discharge status: Home / Self Care | Payer: Medicaid Other | Attending: Emergency Medicine | Admitting: Emergency Medicine

## 2023-02-22 ENCOUNTER — Encounter (HOSPITAL_COMMUNITY): Payer: Self-pay

## 2023-02-22 ENCOUNTER — Emergency Department (HOSPITAL_COMMUNITY): Payer: Medicaid Other

## 2023-02-22 DIAGNOSIS — W228XXA Striking against or struck by other objects, initial encounter: Secondary | ICD-10-CM | POA: Diagnosis not present

## 2023-02-22 DIAGNOSIS — S0003XA Contusion of scalp, initial encounter: Secondary | ICD-10-CM | POA: Insufficient documentation

## 2023-02-22 DIAGNOSIS — H547 Unspecified visual loss: Secondary | ICD-10-CM | POA: Insufficient documentation

## 2023-02-22 DIAGNOSIS — Z79899 Other long term (current) drug therapy: Secondary | ICD-10-CM | POA: Diagnosis not present

## 2023-02-22 DIAGNOSIS — Y990 Civilian activity done for income or pay: Secondary | ICD-10-CM | POA: Insufficient documentation

## 2023-02-22 DIAGNOSIS — Z7982 Long term (current) use of aspirin: Secondary | ICD-10-CM | POA: Diagnosis not present

## 2023-02-22 DIAGNOSIS — S0990XA Unspecified injury of head, initial encounter: Secondary | ICD-10-CM | POA: Diagnosis present

## 2023-02-22 LAB — CBC WITH DIFFERENTIAL/PLATELET
Abs Immature Granulocytes: 0.01 10*3/uL (ref 0.00–0.07)
Basophils Absolute: 0.1 10*3/uL (ref 0.0–0.1)
Basophils Relative: 1 %
Eosinophils Absolute: 0.1 10*3/uL (ref 0.0–0.5)
Eosinophils Relative: 2 %
HCT: 37.9 % (ref 36.0–46.0)
Hemoglobin: 11.6 g/dL — ABNORMAL LOW (ref 12.0–15.0)
Immature Granulocytes: 0 %
Lymphocytes Relative: 35 %
Lymphs Abs: 1.9 10*3/uL (ref 0.7–4.0)
MCH: 25.6 pg — ABNORMAL LOW (ref 26.0–34.0)
MCHC: 30.6 g/dL (ref 30.0–36.0)
MCV: 83.5 fL (ref 80.0–100.0)
Monocytes Absolute: 0.5 10*3/uL (ref 0.1–1.0)
Monocytes Relative: 8 %
Neutro Abs: 2.9 10*3/uL (ref 1.7–7.7)
Neutrophils Relative %: 54 %
Platelets: 220 10*3/uL (ref 150–400)
RBC: 4.54 MIL/uL (ref 3.87–5.11)
RDW: 13.7 % (ref 11.5–15.5)
WBC: 5.5 10*3/uL (ref 4.0–10.5)
nRBC: 0 % (ref 0.0–0.2)

## 2023-02-22 LAB — BASIC METABOLIC PANEL
Anion gap: 9 (ref 5–15)
BUN: 9 mg/dL (ref 6–20)
CO2: 24 mmol/L (ref 22–32)
Calcium: 9 mg/dL (ref 8.9–10.3)
Chloride: 106 mmol/L (ref 98–111)
Creatinine, Ser: 0.68 mg/dL (ref 0.44–1.00)
GFR, Estimated: >60 mL/min (ref >60)
Glucose, Bld: 125 mg/dL — ABNORMAL HIGH (ref 70–99)
Potassium: 3.4 mmol/L — ABNORMAL LOW (ref 3.5–5.1)
Sodium: 139 mmol/L (ref 135–145)

## 2023-02-22 MED ORDER — ONDANSETRON HCL 4 MG/2ML IJ SOLN
4.0000 mg | Freq: Once | INTRAMUSCULAR | Status: AC
  Administered 2023-02-22: 4 mg via INTRAVENOUS
  Filled 2023-02-22: qty 2

## 2023-02-22 MED ORDER — SODIUM CHLORIDE 0.9 % IV BOLUS
1000.0000 mL | Freq: Once | INTRAVENOUS | Status: AC
  Administered 2023-02-22: 1000 mL via INTRAVENOUS

## 2023-02-22 MED ORDER — TETRACAINE HCL 0.5 % OP SOLN
2.0000 [drp] | Freq: Once | OPHTHALMIC | Status: AC
  Administered 2023-02-22: 2 [drp] via OPHTHALMIC
  Filled 2023-02-22: qty 4

## 2023-02-22 MED ORDER — KETOROLAC TROMETHAMINE 15 MG/ML IJ SOLN
15.0000 mg | Freq: Once | INTRAMUSCULAR | Status: AC
  Administered 2023-02-22: 15 mg via INTRAVENOUS
  Filled 2023-02-22: qty 1

## 2023-02-22 NOTE — ED Provider Notes (Signed)
[place] EMERGENCY DEPARTMENT AT [facility] Provider Note   CSN: [id number] Arrival date & time: 02/22/23  1339     History  No chief complaint on file.   Angela Hahn is a 47 y.o. female.  Patient is a 47 year old female who presents with ongoing symptoms after recent head injury.  She states that 2 days ago she was at work and got hit in the head with a door that was opening.  She had no loss of consciousness.  She was hit just above her left eye.  She had previously had a hematoma to that area.  Over the last couple days she has noticed that the swelling is gone around her eye and that she has a little bit of a black eye.  She feels like her vision is a little bit blurry.  She has some shooting sharp pains to the left side of her head which she has had since the accident.  That is unchanged.  She denies any numbness or weakness to her extremities.  On the triage note, it said that she had left-sided deficits due to prior stroke.  She does report that she had a prior stroke in 2022.  She said it was on both sides.  She denies any ongoing weakness from the stroke other than her hand gets tired toward the end of the day and her writing is messier.  She does state that she has been having some intermittent muscle spasms in her left arm for the last several months.  She denies any new symptoms.  She does states she has an upcoming appointment with her neurologist next month.  She had some vomiting this morning but has not had any since that time.  She has been able to drink without ongoing vomiting.       Home Medications Prior to Admission medications   Medication Sig Start Date End Date Taking? Authorizing Provider  acetaminophen (TYLENOL) 325 MG tablet Take 2 tablets (650 mg total) by mouth every 6 (six) hours as needed. 05/12/22   [place] Bing, MD  aspirin EC 81 MG tablet Take 1 tablet (81 mg total) by mouth daily. Swallow whole. 08/08/22   Nelwyn Salisbury, MD   atorvastatin (LIPITOR) 40 MG tablet Take 1 tablet (40 mg total) by mouth daily. 08/08/22   Nelwyn Salisbury, MD  bisacodyl 5 MG EC tablet Take 1 tablet (5 mg total) by mouth daily as needed for moderate constipation. 05/12/22   [place] Bing, MD  diltiazem (CARDIZEM CD) 180 MG 24 hr capsule TAKE 1 CAPSULE BY MOUTH DAILY 10/13/22   Tobb, Kardie, DO  ferrous sulfate 325 (65 FE) MG tablet Take 1 tablet (325 mg total) by mouth every other day for 42 doses. 05/12/22 08/03/22  [place] Bing, MD  LORazepam (ATIVAN) 1 MG tablet TAKE 1 TABLET BY MOUTH EVERY 8 HOURS AS NEEDED FOR ANXIETY 10/16/22   Nelwyn Salisbury, MD  meclizine (ANTIVERT) 25 MG tablet Take 1 tablet (25 mg total) by mouth 3 (three) times daily as needed for dizziness. 02/20/23   Arthor Captain, PA-C  metoprolol succinate (TOPROL-XL) 25 MG 24 hr tablet Take 0.5 tablets (12.5 mg total) by mouth daily. Make an appointment with Cardiologist for further refills. 10/23/22   Tobb, Kardie, DO  naproxen (NAPROSYN) 375 MG tablet Take 1 tablet (375 mg total) by mouth 2 (two) times daily with a meal. 02/20/23   [place], Abigail, PA-C  polyethylene glycol (MIRALAX / GLYCOLAX) 17 g  packet Take 17 g by mouth daily. 06/19/22   [place] Bing, MD  simethicone (MYLICON) 80 MG chewable tablet Chew 1 tablet (80 mg total) by mouth 2 (two) times daily after a meal for 14 days. 05/12/22 05/26/22  [place] Bing, MD  triamcinolone cream (KENALOG) 0.1 % Apply 1 Application topically 2 (two) times daily as needed (eczema). 11/14/22   Nelwyn Salisbury, MD  Cetirizine HCl 10 MG CAPS Take 1 capsule (10 mg total) by mouth daily. 08/28/19 12/17/20  Wieters, Hallie C, PA-C      Allergies    Gadolinium derivatives, Hydrocodone, and Tramadol    Review of Systems   Review of Systems  Constitutional:  Negative for chills, diaphoresis, fatigue and fever.  HENT:  Negative for congestion, rhinorrhea and sneezing.   Eyes:  Positive for visual disturbance.  Respiratory:  Negative  for cough, chest tightness and shortness of breath.   Cardiovascular:  Negative for chest pain and leg swelling.  Gastrointestinal:  Positive for nausea and vomiting. Negative for abdominal pain, blood in stool and diarrhea.  Genitourinary:  Negative for difficulty urinating, flank pain, frequency and hematuria.  Musculoskeletal:  Negative for arthralgias and back pain.  Skin:  Negative for rash.  Neurological:  Positive for headaches. Negative for dizziness, speech difficulty, weakness and numbness.    Physical Exam Updated Vital Signs BP 133/77   Pulse 75   Temp 98.9 F (37.2 C) (Oral)   Resp 16   Ht 5\' 6"  (1.676 m)   Wt 93.4 kg   LMP 04/26/2022 Comment: Urine preg negative 05/09/22  SpO2 99%   BMI 33.23 kg/m  Physical Exam Constitutional:      Appearance: She is well-developed.  HENT:     Head: Normocephalic.     Comments: Minimal swelling to the left periorbital area.  There is a small amount of ecchymosis to the inferior orbital area.  The eye itself is normal-appearing.  Extraocular eye movements are intact.  There is no conjunctival injection.  No drainage. Eyes:     Pupils: Pupils are equal, round, and reactive to light.  Cardiovascular:     Rate and Rhythm: Normal rate and regular rhythm.     Heart sounds: Normal heart sounds.  Pulmonary:     Effort: Pulmonary effort is normal. No respiratory distress.     Breath sounds: Normal breath sounds. No wheezing or rales.  Chest:     Chest wall: No tenderness.  Abdominal:     General: Bowel sounds are normal.     Palpations: Abdomen is soft.     Tenderness: There is no abdominal tenderness. There is no guarding or rebound.  Musculoskeletal:        General: Normal range of motion.     Cervical back: Normal range of motion and neck supple.  Lymphadenopathy:     Cervical: No cervical adenopathy.  Skin:    General: Skin is warm and dry.     Findings: No rash.  Neurological:     General: No focal deficit present.      Mental Status: She is alert and oriented to person, place, and time.     Comments: Motor 5 out of 5 all extremities, sensation grossly intact to light touch all extremities, cranial nerves II through XII grossly intact     ED Results / Procedures / Treatments   Labs (all labs ordered are listed, but only abnormal results are displayed) Labs Reviewed  BASIC METABOLIC PANEL - Abnormal; Notable for the  following components:      Result Value   Potassium 3.4 (*)    Glucose, Bld 125 (*)    All other components within normal limits  CBC WITH DIFFERENTIAL/PLATELET - Abnormal; Notable for the following components:   Hemoglobin 11.6 (*)    MCH 25.6 (*)    All other components within normal limits  CBC WITH DIFFERENTIAL/PLATELET    EKG None  Radiology CT Orbits Wo Contrast  Result Date: 02/22/2023 CLINICAL DATA:  Orbital trauma EXAM: CT ORBITS WITHOUT CONTRAST TECHNIQUE: Multidetector CT imaging of the orbits was performed using the standard protocol without intravenous contrast. Multiplanar CT image reconstructions were also generated. RADIATION DOSE REDUCTION: This exam was performed according to the departmental dose-optimization program which includes automated exposure control, adjustment of the mA and/or kV according to patient size and/or use of iterative reconstruction technique. COMPARISON:  None Available. FINDINGS: Orbits: No orbital mass or evidence of inflammation. Normal appearance of the globes, optic nerve-sheath complexes, extraocular muscles, orbital fat and lacrimal glands. Visible paranasal sinuses: Clear. Soft tissues: Normal. Osseous: No fracture or aggressive lesion. Limited intracranial: No acute or significant finding. IMPRESSION: No evidence of acute abnormality. Electronically Signed   By: Feliberto Harts M.D.   On: 02/22/2023 17:53    Procedures Procedures    Medications Ordered in ED Medications  sodium chloride 0.9 % bolus 1,000 mL (0 mLs Intravenous Stopped  02/22/23 1909)  ondansetron (ZOFRAN) injection 4 mg (4 mg Intravenous Given 02/22/23 1650)  ketorolac (TORADOL) 15 MG/ML injection 15 mg (15 mg Intravenous Given 02/22/23 1651)  tetracaine (PONTOCAINE) 0.5 % ophthalmic solution 2 drop (2 drops Left Eye Given 02/22/23 1910)    ED Course/ Medical Decision Making/ A&P                             Medical Decision Making Amount and/or Complexity of Data Reviewed Labs: ordered. Radiology: ordered.  Risk Prescription drug management.   Patient presents with some blurry vision and pain around the left side of her head from the head injury 2 days ago.  Minimal swelling is noted on exam.  She has no photophobia.  Her eye exam is rather unremarkable.  Pupils are equal round reactive.  She does have some increased pressures at 28.  Her visual acuity was 20/200 in the eye.  CT of the orbits was nonrevealing.  No fractures or retroorbital hematoma was noted.  Labs are nonconcerning.  She was given some Toradol and Zofran and is feeling much better from a pain standpoint.  I consulted with Dr. Dione Booze with ophthalmology who advises patient to come into the office at 8 AM tomorrow.  This was discussed with the patient.  She was discharged home in good condition.  Return precautions were given.  Final Clinical Impression(s) / ED Diagnoses Final diagnoses:  Contusion of scalp, initial encounter  Vision loss    Rx / DC Orders ED Discharge Orders     None         Rolan Bucco, MD 02/22/23 2301

## 2023-02-22 NOTE — ED Triage Notes (Signed)
Pt was hit on head Monday by car door, told by Md she has a concussion. States she woke up this morning with left eye pain, swelling and blurred vision. Reports still has headache. CT done yesterday. Reports vomiting today 5 times. Hx of left side deficits due to old stroke

## 2023-02-28 ENCOUNTER — Ambulatory Visit (INDEPENDENT_AMBULATORY_CARE_PROVIDER_SITE_OTHER): Payer: Medicaid Other | Admitting: Family Medicine

## 2023-02-28 ENCOUNTER — Encounter: Payer: Self-pay | Admitting: Family Medicine

## 2023-02-28 ENCOUNTER — Ambulatory Visit: Payer: Medicaid Other | Admitting: Family Medicine

## 2023-02-28 VITALS — BP 124/80 | HR 99 | Temp 98.4°F | Wt 199.0 lb

## 2023-02-28 DIAGNOSIS — G44311 Acute post-traumatic headache, intractable: Secondary | ICD-10-CM | POA: Diagnosis not present

## 2023-02-28 DIAGNOSIS — S060X0D Concussion without loss of consciousness, subsequent encounter: Secondary | ICD-10-CM | POA: Diagnosis not present

## 2023-02-28 DIAGNOSIS — S0083XD Contusion of other part of head, subsequent encounter: Secondary | ICD-10-CM | POA: Diagnosis not present

## 2023-02-28 MED ORDER — DICLOFENAC SODIUM 75 MG PO TBEC: 75.0000 mg | DELAYED_RELEASE_TABLET | Freq: Three times a day (TID) | ORAL | 2 refills | Status: DC | PRN

## 2023-02-28 NOTE — Progress Notes (Signed)
Subjective:    Patient ID: Angela Hahn, female    DOB: 08/08/1976, 47 y.o.   MRN: [id number]  HPI Here to follow up on a head injury she had while at work on 02-19-23. That day while she was opening a car door, she looked away for a second and that allowed the car door to strike her in the left forehead. There was no LOC, but she immediately "saw stars" and had to sit down. She felt very dizzy and had an immediate headache which was centered above the left eye. She went to the ED on 02-20-23, and her exam revealed a large hematoma above the left eye. Her labs were normal. She had CT scans of the head and the cervical spine which were normal. She was sent home to rest and given Naproxen for the headache. She then went back to the ED on 02-22-23 because she was not feeling any better and she had a lot of pain around the left eye. A CT of the orbits was normal except for the soft tissue swelling under the hematoma. She was given Meclizine that day for dizziness. Since then the nausea and the dizziness have improved, but she still has a bad headache around the left eye. She also describes some blurred vision in the left eye, and she sees a "white cloud" in the left visual field at time. No double vision. 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 This has caused her to have a lot of headaches.    Review of Systems  Constitutional: Negative.   HENT: Negative.    Eyes:  Positive for pain and visual disturbance. Negative for photophobia, discharge and redness.  Respiratory: Negative.    Cardiovascular: Negative.   Neurological:  Positive for dizziness and headaches. Negative for tremors, seizures, syncope, facial asymmetry, speech difficulty, weakness, light-headedness and numbness.       Objective:   Physical Exam Constitutional:      Appearance: Normal appearance.  HENT:     Head:     Comments: She has slight swelling over the left eyebrow, and she is  very tender over the left upper orbital rim.  Eyes:     Extraocular Movements: Extraocular movements intact.     Conjunctiva/sclera: Conjunctivae normal.     Pupils: Pupils are equal, round, and reactive to light.  Cardiovascular:     Rate and Rhythm: Normal rate and regular rhythm.     Pulses: Normal pulses.     Heart sounds: Normal heart sounds.  Pulmonary:     Effort: Pulmonary effort is normal.     Breath sounds: Normal breath sounds.  Musculoskeletal:     Cervical back: Normal range of motion.  Neurological:     Mental Status: She is alert and oriented to person, place, and time.     Coordination: Coordination normal.     Gait: Gait normal.           Assessment & Plan:  She has had a head contusion and a mild concussion. I believe the blow caused damage to the left supraorbital nerve, and this is the cause of her headaches. I think her eye itself is intact, but I advised her to see her eye asap. We will give her Diclofenac to use as needed for the headache. We will write her out of work from today until 03-12-23. Due to the concussion she needs to avoid looking at TV screens  and computer screens and cell phone screens as much as possible for the next week or so. We will see her back next week to follow up. We spent a total of ( 34  ) minutes reviewing records and discussing these issues.  Gershon Crane, MD

## 2023-03-12 ENCOUNTER — Ambulatory Visit: Payer: Medicaid Other | Admitting: Family Medicine

## 2023-03-12 ENCOUNTER — Encounter: Payer: Self-pay | Admitting: Family Medicine

## 2023-03-12 VITALS — BP 120/80 | HR 74 | Temp 98.4°F | Wt 200.0 lb

## 2023-03-12 DIAGNOSIS — F0781 Postconcussional syndrome: Secondary | ICD-10-CM

## 2023-03-12 DIAGNOSIS — T148XXA Other injury of unspecified body region, initial encounter: Secondary | ICD-10-CM | POA: Diagnosis not present

## 2023-03-12 DIAGNOSIS — H538 Other visual disturbances: Secondary | ICD-10-CM | POA: Diagnosis not present

## 2023-03-12 LAB — HEPATIC FUNCTION PANEL
ALT: 24 U/L (ref 0–35)
AST: 20 U/L (ref 0–37)
Albumin: 4.5 g/dL (ref 3.5–5.2)
Alkaline Phosphatase: 62 U/L (ref 39–117)
Bilirubin, Direct: 0.1 mg/dL (ref 0.0–0.3)
Total Bilirubin: 0.5 mg/dL (ref 0.2–1.2)
Total Protein: 8.3 g/dL (ref 6.0–8.3)

## 2023-03-12 LAB — CBC WITH DIFFERENTIAL/PLATELET
Basophils Absolute: 0.1 10*3/uL (ref 0.0–0.1)
Basophils Relative: 1.2 % (ref 0.0–3.0)
Eosinophils Absolute: 0.1 10*3/uL (ref 0.0–0.7)
Eosinophils Relative: 2.9 % (ref 0.0–5.0)
HCT: 40.6 % (ref 36.0–46.0)
Hemoglobin: 13 g/dL (ref 12.0–15.0)
Lymphocytes Relative: 35.8 % (ref 12.0–46.0)
Lymphs Abs: 1.8 10*3/uL (ref 0.7–4.0)
MCHC: 32.1 g/dL (ref 30.0–36.0)
MCV: 80.9 fl (ref 78.0–100.0)
Monocytes Absolute: 0.5 10*3/uL (ref 0.1–1.0)
Monocytes Relative: 10.6 % (ref 3.0–12.0)
Neutro Abs: 2.5 10*3/uL (ref 1.4–7.7)
Neutrophils Relative %: 49.5 % (ref 43.0–77.0)
Platelets: 244 10*3/uL (ref 150.0–400.0)
RBC: 5.02 Mil/uL (ref 3.87–5.11)
RDW: 14.3 % (ref 11.5–15.5)
WBC: 5.1 10*3/uL (ref 4.0–10.5)

## 2023-03-12 LAB — BASIC METABOLIC PANEL
BUN: 11 mg/dL (ref 6–23)
CO2: 25 mEq/L (ref 19–32)
Calcium: 10.1 mg/dL (ref 8.4–10.5)
Chloride: 105 mEq/L (ref 96–112)
Creatinine, Ser: 0.74 mg/dL (ref 0.40–1.20)
GFR: 95.43 mL/min (ref >60.00)
Glucose, Bld: 109 mg/dL — ABNORMAL HIGH (ref 70–99)
Potassium: 3.7 mEq/L (ref 3.5–5.1)
Sodium: 139 mEq/L (ref 135–145)

## 2023-03-12 LAB — PROTIME-INR
INR: 1 ratio (ref 0.8–1.0)
Prothrombin Time: 10.7 s (ref 9.6–13.1)

## 2023-03-12 LAB — APTT: aPTT: 30 s (ref 25.4–36.8)

## 2023-03-12 LAB — TSH: TSH: 2.37 u[IU]/mL (ref 0.35–5.50)

## 2023-03-12 NOTE — Progress Notes (Signed)
   Subjective:    Patient ID: Angela Hahn, female    DOB: 1976/01/05, 47 y.o.   MRN: [id number]  HPI Here to follow up on a concussion she sustained on 02-19-23 when she was stuck in the left forehead by a car door. No LOC. We saw her on 02-28-23 when she was still having left frontal headaches, blurred vision, and dizziness. Since then the headaches persist, but they are getting much less intense. She still has blurring in the left eye, and she is still dizzy at times. She is scheduled to see her eye doctor on 03-15-23. A new issue that has come up the past 2 weeks is bruising on the arms and legs. She has never had this before. She still takes her daily 81 mg ASA, but she did take a lot of Ibuprofen for a week after the accident. Now she only takes Tylenol for pain.    Review of Systems  Constitutional: Negative.   Eyes:  Positive for visual disturbance.  Respiratory: Negative.    Cardiovascular: Negative.   Gastrointestinal: Negative.   Genitourinary: Negative.   Neurological:  Positive for dizziness and headaches.  Hematological:  Bruises/bleeds easily.       Objective:   Physical Exam Constitutional:      General: She is not in acute distress.    Appearance: Normal appearance.  Eyes:     Extraocular Movements: Extraocular movements intact.     Pupils: Pupils are equal, round, and reactive to light.  Cardiovascular:     Rate and Rhythm: Normal rate and regular rhythm.     Pulses: Normal pulses.     Heart sounds: Normal heart sounds.  Pulmonary:     Effort: Pulmonary effort is normal.     Breath sounds: Normal breath sounds.  Musculoskeletal:     Comments: Mildly tender over the left forehead but the swelling has resolved   Skin:    Comments: There numerous small ecchymoses on the arms and legs  Neurological:     Mental Status: She is alert.           Assessment & Plan:  Post concussion syndrome. She will have her eyes examined later this week. The headaches are  getting better but the dizziness persists. We will refer her to Neurology for further evaluation. We will extend her work excuse until 03-19-23. I think the bruising is an effect of all the NSAID's she was taking after the accident. We will get labs today to make sure nothing else is going on.  Gershon Crane, MD

## 2023-03-13 ENCOUNTER — Other Ambulatory Visit: Payer: Self-pay

## 2023-03-13 ENCOUNTER — Emergency Department (HOSPITAL_BASED_OUTPATIENT_CLINIC_OR_DEPARTMENT_OTHER)
Admission: EM | Admit: 2023-03-13 | Discharge: 2023-03-13 | Disposition: A | Discharge status: Home / Self Care | Payer: Medicaid Other | Attending: Emergency Medicine | Admitting: Emergency Medicine

## 2023-03-13 ENCOUNTER — Encounter (HOSPITAL_BASED_OUTPATIENT_CLINIC_OR_DEPARTMENT_OTHER): Payer: Self-pay | Admitting: *Deleted

## 2023-03-13 DIAGNOSIS — Z7982 Long term (current) use of aspirin: Secondary | ICD-10-CM | POA: Diagnosis not present

## 2023-03-13 DIAGNOSIS — S40022A Contusion of left upper arm, initial encounter: Secondary | ICD-10-CM | POA: Diagnosis not present

## 2023-03-13 DIAGNOSIS — X58XXXA Exposure to other specified factors, initial encounter: Secondary | ICD-10-CM | POA: Diagnosis not present

## 2023-03-13 DIAGNOSIS — S4992XA Unspecified injury of left shoulder and upper arm, initial encounter: Secondary | ICD-10-CM | POA: Diagnosis present

## 2023-03-13 NOTE — ED Provider Notes (Signed)
[place] EMERGENCY DEPARTMENT AT [facility] Provider Note   CSN: [id number] Arrival date & time: 03/13/23  0202     History  No chief complaint on file.   Angela Hahn is a 47 y.o. female.  Patient is a 47 year old female presenting with a bruise to her left arm.  She reports having blood drawn earlier today, then noticed the hematoma this evening.  She describes pain shooting down her arm, but no weakness or numbness.  The history is provided by the patient.       Home Medications Prior to Admission medications   Medication Sig Start Date End Date Taking? Authorizing Provider  acetaminophen (TYLENOL) 325 MG tablet Take 2 tablets (650 mg total) by mouth every 6 (six) hours as needed. 05/12/22   [place] Bing, MD  aspirin EC 81 MG tablet Take 1 tablet (81 mg total) by mouth daily. Swallow whole. 08/08/22   Nelwyn Salisbury, MD  atorvastatin (LIPITOR) 40 MG tablet Take 1 tablet (40 mg total) by mouth daily. 08/08/22   Nelwyn Salisbury, MD  bisacodyl 5 MG EC tablet Take 1 tablet (5 mg total) by mouth daily as needed for moderate constipation. 05/12/22   [place] Bing, MD  diclofenac (VOLTAREN) 75 MG EC tablet Take 1 tablet (75 mg total) by mouth every 8 (eight) hours as needed for moderate pain. 02/28/23   Nelwyn Salisbury, MD  diltiazem (CARDIZEM CD) 180 MG 24 hr capsule TAKE 1 CAPSULE BY MOUTH DAILY 10/13/22   Tobb, Kardie, DO  LORazepam (ATIVAN) 1 MG tablet TAKE 1 TABLET BY MOUTH EVERY 8 HOURS AS NEEDED FOR ANXIETY 10/16/22   Nelwyn Salisbury, MD  meclizine (ANTIVERT) 25 MG tablet Take 1 tablet (25 mg total) by mouth 3 (three) times daily as needed for dizziness. 02/20/23   Arthor Captain, PA-C  metoprolol succinate (TOPROL-XL) 25 MG 24 hr tablet Take 0.5 tablets (12.5 mg total) by mouth daily. Make an appointment with Cardiologist for further refills. 10/23/22   Tobb, Kardie, DO  polyethylene glycol (MIRALAX / GLYCOLAX) 17 g packet Take 17 g by mouth daily. 06/19/22   [place] Bing, MD  simethicone (MYLICON) 80 MG chewable tablet Chew 1 tablet (80 mg total) by mouth 2 (two) times daily after a meal for 14 days. 05/12/22 05/26/22  [place] Bing, MD  triamcinolone cream (KENALOG) 0.1 % Apply 1 Application topically 2 (two) times daily as needed (eczema). 11/14/22   Nelwyn Salisbury, MD  Cetirizine HCl 10 MG CAPS Take 1 capsule (10 mg total) by mouth daily. 08/28/19 12/17/20  Wieters, Hallie C, PA-C      Allergies    Gadolinium derivatives, Hydrocodone, and Tramadol    Review of Systems   Review of Systems  All other systems reviewed and are negative.   Physical Exam Updated Vital Signs BP (!) 140/78   Pulse 98   Temp 98.7 F (37.1 C)   Resp 16   LMP 04/26/2022 Comment: Urine preg negative 05/09/22  SpO2 100%  Physical Exam Vitals and nursing note reviewed.  Pulmonary:     Effort: Pulmonary effort is normal.  Musculoskeletal:     Comments: The left upper extremity is grossly normal in appearance except for a hematoma measuring 3 cm round to the medial aspect just above the elbow.  Ulnar and radial pulses are palpable and motor and sensation are intact throughout the entire hand.  Skin:    General: Skin is warm.     ED Results /  Procedures / Treatments   Labs (all labs ordered are listed, but only abnormal results are displayed) Labs Reviewed - No data to display  EKG None  Radiology No results found.  Procedures Procedures    Medications Ordered in ED Medications - No data to display  ED Course/ Medical Decision Making/ A&P  Hematoma noted to the left upper extremity as described in the HPI.  This was the result of the blood draw from earlier today.  Patient will be advised to apply heating pad, take ibuprofen, and follow-up as needed.  Final Clinical Impression(s) / ED Diagnoses Final diagnoses:  None    Rx / DC Orders ED Discharge Orders     None         Geoffery Lyons, MD 03/13/23 [id number]

## 2023-03-13 NOTE — Discharge Instructions (Signed)
Apply a heating pad for the next 2 to 3 days to help dissipate the hematoma.  Take ibuprofen 600 mg every 6 hours as needed for pain.  Follow-up with your primary doctor if not improving in the next few days.

## 2023-03-13 NOTE — ED Triage Notes (Addendum)
Pt had blood drawn yesterday from L upper arm, noticed bruise tonight with throbbing down arm, denies numbness or tingling

## 2023-05-03 ENCOUNTER — Other Ambulatory Visit: Payer: Self-pay

## 2023-05-03 ENCOUNTER — Other Ambulatory Visit (HOSPITAL_BASED_OUTPATIENT_CLINIC_OR_DEPARTMENT_OTHER): Payer: Self-pay

## 2023-05-03 ENCOUNTER — Encounter (HOSPITAL_BASED_OUTPATIENT_CLINIC_OR_DEPARTMENT_OTHER): Payer: Self-pay

## 2023-05-03 ENCOUNTER — Emergency Department (HOSPITAL_BASED_OUTPATIENT_CLINIC_OR_DEPARTMENT_OTHER)
Admission: EM | Admit: 2023-05-03 | Discharge: 2023-05-03 | Disposition: A | Discharge status: Home / Self Care | Payer: Medicaid Other | Attending: Emergency Medicine | Admitting: Emergency Medicine

## 2023-05-03 DIAGNOSIS — Z7982 Long term (current) use of aspirin: Secondary | ICD-10-CM | POA: Diagnosis not present

## 2023-05-03 DIAGNOSIS — R109 Unspecified abdominal pain: Secondary | ICD-10-CM | POA: Diagnosis present

## 2023-05-03 DIAGNOSIS — R110 Nausea: Secondary | ICD-10-CM | POA: Diagnosis not present

## 2023-05-03 DIAGNOSIS — Z8616 Personal history of COVID-19: Secondary | ICD-10-CM | POA: Insufficient documentation

## 2023-05-03 DIAGNOSIS — R509 Fever, unspecified: Secondary | ICD-10-CM | POA: Insufficient documentation

## 2023-05-03 DIAGNOSIS — N3000 Acute cystitis without hematuria: Secondary | ICD-10-CM | POA: Diagnosis not present

## 2023-05-03 DIAGNOSIS — Z1152 Encounter for screening for COVID-19: Secondary | ICD-10-CM | POA: Diagnosis not present

## 2023-05-03 DIAGNOSIS — Z113 Encounter for screening for infections with a predominantly sexual mode of transmission: Secondary | ICD-10-CM | POA: Insufficient documentation

## 2023-05-03 LAB — URINALYSIS, ROUTINE W REFLEX MICROSCOPIC
Bilirubin Urine: NEGATIVE
Glucose, UA: 250 mg/dL — AB
Nitrite: POSITIVE — AB
Protein, ur: 30 mg/dL — AB
Specific Gravity, Urine: 1.026 (ref 1.005–1.030)
WBC, UA: >50 WBC/hpf (ref 0–5)
pH: 5.5 (ref 5.0–8.0)

## 2023-05-03 LAB — PREGNANCY, URINE: Preg Test, Ur: NEGATIVE

## 2023-05-03 LAB — COMPREHENSIVE METABOLIC PANEL
ALT: 30 U/L (ref 0–44)
AST: 20 U/L (ref 15–41)
Albumin: 4.3 g/dL (ref 3.5–5.0)
Alkaline Phosphatase: 87 U/L (ref 38–126)
Anion gap: 9 (ref 5–15)
BUN: 8 mg/dL (ref 6–20)
CO2: 28 mmol/L (ref 22–32)
Calcium: 9.4 mg/dL (ref 8.9–10.3)
Chloride: 102 mmol/L (ref 98–111)
Creatinine, Ser: 0.59 mg/dL (ref 0.44–1.00)
GFR, Estimated: >60 mL/min (ref >60)
Glucose, Bld: 112 mg/dL — ABNORMAL HIGH (ref 70–99)
Potassium: 3.3 mmol/L — ABNORMAL LOW (ref 3.5–5.1)
Sodium: 139 mmol/L (ref 135–145)
Total Bilirubin: 0.9 mg/dL (ref 0.3–1.2)
Total Protein: 8.3 g/dL — ABNORMAL HIGH (ref 6.5–8.1)

## 2023-05-03 LAB — RESP PANEL BY RT-PCR (RSV, FLU A&B, COVID)  RVPGX2
Influenza A by PCR: NEGATIVE
Influenza B by PCR: NEGATIVE
Resp Syncytial Virus by PCR: NEGATIVE
SARS Coronavirus 2 by RT PCR: NEGATIVE

## 2023-05-03 LAB — CBC
HCT: 40.2 % (ref 36.0–46.0)
Hemoglobin: 12.9 g/dL (ref 12.0–15.0)
MCH: 26.2 pg (ref 26.0–34.0)
MCHC: 32.1 g/dL (ref 30.0–36.0)
MCV: 81.7 fL (ref 80.0–100.0)
Platelets: 210 10*3/uL (ref 150–400)
RBC: 4.92 MIL/uL (ref 3.87–5.11)
RDW: 13 % (ref 11.5–15.5)
WBC: 7.8 10*3/uL (ref 4.0–10.5)
nRBC: 0 % (ref 0.0–0.2)

## 2023-05-03 LAB — LIPASE, BLOOD: Lipase: 19 U/L (ref 11–51)

## 2023-05-03 LAB — WET PREP, GENITAL
Clue Cells Wet Prep HPF POC: NONE SEEN
Sperm: NONE SEEN
Trich, Wet Prep: NONE SEEN
WBC, Wet Prep HPF POC: <10 (ref <10)
Yeast Wet Prep HPF POC: NONE SEEN

## 2023-05-03 MED ORDER — CEPHALEXIN 250 MG PO CAPS
1000.0000 mg | ORAL_CAPSULE | Freq: Once | ORAL | Status: AC
  Administered 2023-05-03: 1000 mg via ORAL
  Filled 2023-05-03: qty 4

## 2023-05-03 MED ORDER — KETOROLAC TROMETHAMINE 15 MG/ML IJ SOLN
15.0000 mg | Freq: Once | INTRAMUSCULAR | Status: AC
  Administered 2023-05-03: 15 mg via INTRAVENOUS
  Filled 2023-05-03: qty 1

## 2023-05-03 MED ORDER — DOXYCYCLINE HYCLATE 100 MG PO TABS
100.0000 mg | ORAL_TABLET | Freq: Once | ORAL | Status: AC
  Administered 2023-05-03: 100 mg via ORAL
  Filled 2023-05-03: qty 1

## 2023-05-03 MED ORDER — LACTATED RINGERS IV BOLUS
1000.0000 mL | Freq: Once | INTRAVENOUS | Status: AC
  Administered 2023-05-03: 1000 mL via INTRAVENOUS

## 2023-05-03 MED ORDER — ONDANSETRON HCL 4 MG/2ML IJ SOLN
4.0000 mg | Freq: Once | INTRAMUSCULAR | Status: AC
  Administered 2023-05-03: 4 mg via INTRAVENOUS
  Filled 2023-05-03: qty 2

## 2023-05-03 MED ORDER — CEPHALEXIN 500 MG PO CAPS: 500.0000 mg | ORAL_CAPSULE | Freq: Four times a day (QID) | ORAL | 0 refills | Status: AC

## 2023-05-03 MED ORDER — CEFTRIAXONE SODIUM 500 MG IJ SOLR
500.0000 mg | Freq: Once | INTRAMUSCULAR | Status: AC
  Administered 2023-05-03: 500 mg via INTRAMUSCULAR
  Filled 2023-05-03: qty 500

## 2023-05-03 MED ORDER — LIDOCAINE HCL (PF) 1 % IJ SOLN
1.0000 mL | Freq: Once | INTRAMUSCULAR | Status: AC
  Administered 2023-05-03: 1 mL
  Filled 2023-05-03: qty 5

## 2023-05-03 NOTE — ED Provider Notes (Signed)
[place] EMERGENCY DEPARTMENT AT [facility] Provider Note   CSN: [id number] Arrival date & time: 05/03/23  4098     History  Chief Complaint  Patient presents with   Abdominal Pain    Angela Hahn is a 47 y.o. female with 3 days of headache, chills, nausea/vomiting, abdominal pain. Denies runny nose, cough. Reports a 103.0 F fever at home (oral), took Tylenol at 4:00 AM. She has had COVID before and this feels the same way. She is not keeping down fluids or food, says she has had nonbilious vomiting for the past 2 days.  For relief, has taken Robitussin, Tylenol.  No dysuria, hematuria. She was sexually active last week and reports condom malfunction.  She would like STI testing for gonorrhea and chlamydia.  She has had increased watery discharge, but otherwise denies change in vaginal odor, or irritation.  No concern for pregnancy as she has a history of hysterectomy with salpingectomy.    Home Medications Prior to Admission medications   Medication Sig Start Date End Date Taking? Authorizing Provider  cephALEXin (KEFLEX) 500 MG capsule Take 1 capsule (500 mg total) by mouth 4 (four) times daily for 7 days. 05/03/23 05/10/23 Yes Vianna Venezia, Nolberto Hanlon, DO  acetaminophen (TYLENOL) 325 MG tablet Take 2 tablets (650 mg total) by mouth every 6 (six) hours as needed. 05/12/22   [place] Bing, MD  aspirin EC 81 MG tablet Take 1 tablet (81 mg total) by mouth daily. Swallow whole. 08/08/22   Nelwyn Salisbury, MD  atorvastatin (LIPITOR) 40 MG tablet Take 1 tablet (40 mg total) by mouth daily. 08/08/22   Nelwyn Salisbury, MD  bisacodyl 5 MG EC tablet Take 1 tablet (5 mg total) by mouth daily as needed for moderate constipation. 05/12/22   [place] Bing, MD  diclofenac (VOLTAREN) 75 MG EC tablet Take 1 tablet (75 mg total) by mouth every 8 (eight) hours as needed for moderate pain. 02/28/23   Nelwyn Salisbury, MD  diltiazem (CARDIZEM CD) 180 MG 24 hr capsule TAKE 1 CAPSULE BY MOUTH DAILY  10/13/22   Tobb, Kardie, DO  LORazepam (ATIVAN) 1 MG tablet TAKE 1 TABLET BY MOUTH EVERY 8 HOURS AS NEEDED FOR ANXIETY 10/16/22   Nelwyn Salisbury, MD  meclizine (ANTIVERT) 25 MG tablet Take 1 tablet (25 mg total) by mouth 3 (three) times daily as needed for dizziness. 02/20/23   Arthor Captain, PA-C  metoprolol succinate (TOPROL-XL) 25 MG 24 hr tablet Take 0.5 tablets (12.5 mg total) by mouth daily. Make an appointment with Cardiologist for further refills. 10/23/22   Tobb, Kardie, DO  polyethylene glycol (MIRALAX / GLYCOLAX) 17 g packet Take 17 g by mouth daily. 06/19/22   [place] Bing, MD  simethicone (MYLICON) 80 MG chewable tablet Chew 1 tablet (80 mg total) by mouth 2 (two) times daily after a meal for 14 days. 05/12/22 05/26/22  [place] Bing, MD  triamcinolone cream (KENALOG) 0.1 % Apply 1 Application topically 2 (two) times daily as needed (eczema). 11/14/22   Nelwyn Salisbury, MD  Cetirizine HCl 10 MG CAPS Take 1 capsule (10 mg total) by mouth daily. 08/28/19 12/17/20  Wieters, Hallie C, PA-C      Allergies    Gadolinium derivatives, Hydrocodone, and Tramadol    Review of Systems   Review of Systems  Constitutional:  Positive for chills and fever.  HENT:  Negative for sneezing and sore throat.   Respiratory:  Negative for cough, shortness of breath and wheezing.  Cardiovascular:  Negative for chest pain and leg swelling.  Gastrointestinal:  Positive for abdominal pain, nausea and vomiting. Negative for diarrhea.  Genitourinary:  Positive for vaginal discharge. Negative for decreased urine volume, dysuria, frequency and urgency.  Neurological:  Positive for headaches. Negative for dizziness and light-headedness.  Psychiatric/Behavioral:  Negative for confusion.     Physical Exam Updated Vital Signs BP 137/71   Pulse 89   Temp 98.8 F (37.1 C) (Oral)   Resp 15   Ht 5\' 6"  (1.676 m)   Wt 77.1 kg   LMP 04/26/2022 Comment: Urine preg negative 05/09/22  SpO2 100%   BMI 27.44 kg/m   Physical Exam Constitutional:      General: She is not in acute distress.    Appearance: She is not ill-appearing.  HENT:     Mouth/Throat:     Pharynx: No pharyngeal swelling.  Cardiovascular:     Rate and Rhythm: Normal rate and regular rhythm.  Pulmonary:     Effort: Pulmonary effort is normal.     Breath sounds: Normal breath sounds.  Abdominal:     General: Bowel sounds are normal. There is no distension.     Palpations: Abdomen is soft.     Tenderness: There is abdominal tenderness in the epigastric area and suprapubic area.  Skin:    General: Skin is warm and dry.  Neurological:     General: No focal deficit present.     Mental Status: She is alert and oriented to person, place, and time.  Psychiatric:        Mood and Affect: Mood normal.     ED Results / Procedures / Treatments   Labs (all labs ordered are listed, but only abnormal results are displayed) Labs Reviewed  URINALYSIS, ROUTINE W REFLEX MICROSCOPIC - Abnormal; Notable for the following components:      Result Value   APPearance HAZY (*)    Glucose, UA 250 (*)    Hgb urine dipstick SMALL (*)    Ketones, ur TRACE (*)    Protein, ur 30 (*)    Nitrite POSITIVE (*)    Leukocytes,Ua MODERATE (*)    Bacteria, UA MANY (*)    All other components within normal limits  COMPREHENSIVE METABOLIC PANEL - Abnormal; Notable for the following components:   Potassium 3.3 (*)    Glucose, Bld 112 (*)    Total Protein 8.3 (*)    All other components within normal limits  WET PREP, GENITAL  RESP PANEL BY RT-PCR (RSV, FLU A&B, COVID)  RVPGX2  PREGNANCY, URINE  LIPASE, BLOOD  CBC  URINE CYTOLOGY ANCILLARY ONLY    EKG None  Radiology No results found.  Procedures Procedures    Medications Ordered in ED Medications  cefTRIAXone (ROCEPHIN) injection 500 mg (has no administration in time range)  lidocaine (PF) (XYLOCAINE) 1 % injection 1-2.1 mL (has no administration in time range)  doxycycline  (VIBRA-TABS) tablet 100 mg (has no administration in time range)  cephALEXin (KEFLEX) capsule 1,000 mg (has no administration in time range)  lactated ringers bolus 1,000 mL (1,000 mLs Intravenous New Bag/Given 05/03/23 0800)  ondansetron (ZOFRAN) injection 4 mg (4 mg Intravenous Given 05/03/23 0800)  ketorolac (TORADOL) 15 MG/ML injection 15 mg (15 mg Intravenous Given 05/03/23 9147)    ED Course/ Medical Decision Making/ A&P  Medical Decision Making 47 year old female with acute abdominal pain, nausea, vomiting most consistent with acute uncomplicated cystitis.  Urinalysis positive for nitrates, leukocytes and many bacteria.  No concern for pyelonephritis-she is well-appearing, afebrile does not have flank pain.  Wet prep negative for BV, yeast, trichomonas.  Normal exam other than mild epigastric/suprapubic tenderness.    No need for CT today or other imaging given benign abdominal exam. Reassuringly, hemodynamically stable. No signs of sepsis.  Considered other high acuity conditions including SBO, DKA, pancreatitis, but these are not likely.  Plan: -In the ED, treated IV Zofran and 1 L LR bolus, initial dose of Keflex for UTI -Offered prophylactic STI testing, which patient agreed to, administered doxycycline 100 mg x 1.  Patient advised to check MyChart for final result of gonorrhea/chlamydia. Will need further treatment and test of cure if positive. -Discharged home in stable condition with treatment of UTI with cephalexin 500 mg 4 times daily for 7 days. -Return precautions discussed.  Amount and/or Complexity of Data Reviewed Labs: ordered.  Risk Prescription drug management.          Final Clinical Impression(s) / ED Diagnoses Final diagnoses:  Acute cystitis without hematuria  Nausea    Rx / DC Orders ED Discharge Orders          Ordered    cephALEXin (KEFLEX) 500 MG capsule  4 times daily        05/03/23 0900               Darral Dash, DO 05/03/23 0914    Franne Forts, DO 05/08/23 0011

## 2023-05-03 NOTE — ED Triage Notes (Signed)
Pt states that she has had lower abdominal pain and vomiting x 2 days. Pt also reports that her urine has been darker than usual. Pt also wants to be tested for STD's. Denies discharge.

## 2023-05-03 NOTE — ED Notes (Signed)
Patient verbalizes understanding of discharge instructions. Opportunity for questioning and answers were provided. Patient discharged from ED.  °

## 2023-05-03 NOTE — Discharge Instructions (Addendum)
It was a pleasure caring for you today in the emergency department.  We treated you prophylactically for sexually transmitted infection.  Please follow-up with results on MyChart.  You will need further treatment if positive for chlamydia.  You have a urinary tract infection, which we will treat with an antibiotic called cephalexin.  Take this as prescribed.  Get plenty of rest over the next few days, drink plenty of fluids. Please follow up with your PCP in the next 3-5 days for a recheck. Please return to the emergency department for any worsening or worrisome symptoms.

## 2023-05-04 ENCOUNTER — Other Ambulatory Visit: Payer: Self-pay | Admitting: Family Medicine

## 2023-05-04 ENCOUNTER — Telehealth: Payer: Self-pay

## 2023-05-04 ENCOUNTER — Telehealth (HOSPITAL_BASED_OUTPATIENT_CLINIC_OR_DEPARTMENT_OTHER): Payer: Self-pay | Admitting: Emergency Medicine

## 2023-05-04 LAB — URINE CYTOLOGY ANCILLARY ONLY
Chlamydia: NEGATIVE
Comment: NEGATIVE
Comment: NEGATIVE
Comment: NORMAL
Neisseria Gonorrhea: NEGATIVE
Trichomonas: POSITIVE — AB

## 2023-05-04 MED ORDER — METRONIDAZOLE 500 MG PO TABS: 2000.0000 mg | ORAL_TABLET | Freq: Once | ORAL | 0 refills | Status: AC

## 2023-05-04 NOTE — Telephone Encounter (Signed)
Flagyl sent to pharmacy for trichmonsis infxn, nurse has made patient aware.

## 2023-05-04 NOTE — Telephone Encounter (Signed)
Pt LOV wss on 03/12/23 Last refill was done on 10/16/22 Please advise

## 2023-05-04 NOTE — Telephone Encounter (Signed)
Pt with question about her urine results "trichomonas" pt was frustrated this NT could not tell her the tx for trichomonas and became rude and asking "why dont you know." Computer dropped call anc called pt back and had difficulty finding the number to [facility]. Successfully transferred pt to ED at [facility].

## 2023-05-07 ENCOUNTER — Other Ambulatory Visit: Payer: Self-pay | Admitting: Family Medicine

## 2023-05-07 DIAGNOSIS — E785 Hyperlipidemia, unspecified: Secondary | ICD-10-CM

## 2023-06-11 NOTE — Progress Notes (Incomplete)
ACUTE VISIT No chief complaint on file.  HPI: [name] is a 47 y.o. female with a PMHx significant for CVA, HTN, HLD, dysmenorrhea, anxiety, chronic low back pain, and iron deficiency anemia, who is here today complaining of an eczema flare up.   HPI  Review of Systems See other pertinent positives and negatives in HPI.  Current Outpatient Medications on File Prior to Visit  Medication Sig Dispense Refill   acetaminophen (TYLENOL) 325 MG tablet Take 2 tablets (650 mg total) by mouth every 6 (six) hours as needed. 30 tablet 1   aspirin EC 81 MG tablet Take 1 tablet (81 mg total) by mouth daily. Swallow whole. 90 tablet 3   atorvastatin (LIPITOR) 40 MG tablet TAKE 1 TABLET(40 MG) BY MOUTH DAILY 90 tablet 3   bisacodyl 5 MG EC tablet Take 1 tablet (5 mg total) by mouth daily as needed for moderate constipation. 10 tablet 0   diclofenac (VOLTAREN) 75 MG EC tablet Take 1 tablet (75 mg total) by mouth every 8 (eight) hours as needed for moderate pain. 60 tablet 2   diltiazem (CARDIZEM CD) 180 MG 24 hr capsule TAKE 1 CAPSULE BY MOUTH DAILY 90 capsule 3   LORazepam (ATIVAN) 1 MG tablet TAKE 1 TABLET BY MOUTH EVERY 8 HOURS AS NEEDED FOR ANXIETY 90 tablet 5   meclizine (ANTIVERT) 25 MG tablet Take 1 tablet (25 mg total) by mouth 3 (three) times daily as needed for dizziness. 30 tablet 0   metoprolol succinate (TOPROL-XL) 25 MG 24 hr tablet Take 0.5 tablets (12.5 mg total) by mouth daily. Make an appointment with Cardiologist for further refills. 30 tablet 0   polyethylene glycol (MIRALAX / GLYCOLAX) 17 g packet Take 17 g by mouth daily. 30 each 1   simethicone (MYLICON) 80 MG chewable tablet Chew 1 tablet (80 mg total) by mouth 2 (two) times daily after a meal for 14 days. 28 tablet 0   triamcinolone cream (KENALOG) 0.1 % Apply 1 Application topically 2 (two) times daily as needed (eczema). 15 g 0   [DISCONTINUED] Cetirizine HCl 10 MG CAPS Take 1 capsule (10 mg total) by mouth daily. 15  capsule 0   No current facility-administered medications on file prior to visit.    Past Medical History:  Diagnosis Date   Anemia    Anxiety    Arthritis    7-8 years ago patient was told she had degenerative arthritis in her back   Chronic headaches    Concussion with loss of consciousness 09/24/2015   COVID-19 virus infection 03/08/2021   Dysmenorrhea    Heart murmur    dx'd since childhood   Sleep apnea    Stroke (HCC)    Allergies  Allergen Reactions   Gadolinium Derivatives Itching, Swelling and Cough   Hydrocodone Other (See Comments)    Itching, takes Benadryl with this medicine   Tramadol Other (See Comments)    headache    Social History   Socioeconomic History   Marital status: Single    Spouse name: Not on file   Number of children: 3   Years of education: Not on file   Highest education level: Some college, no degree  Occupational History   Not on file  Tobacco Use   Smoking status: Former    Current packs/day: 0.00    Types: Cigarettes    Quit date: 2020    Years since quitting: 4.7   Smokeless tobacco: Never  Vaping Use  Vaping status: Some Days  Substance and Sexual Activity   Alcohol use: No    Alcohol/week: 0.0 standard drinks of alcohol   Drug use: No   Sexual activity: Yes    Partners: Male    Birth control/protection: Surgical    Comment: Tubal  Other Topics Concern   Not on file  Social History Narrative   Caffeine- might have 1 c coffee   Social Determinants of Health   Financial Resource Strain: Not on file  Food Insecurity: No Food Insecurity (05/18/2022)   Hunger Vital Sign    Worried About Running Out of Food in the Last Year: Never true    Ran Out of Food in the Last Year: Never true  Transportation Needs: No Transportation Needs (05/18/2022)   PRAPARE - Administrator, Civil Service (Medical): No    Lack of Transportation (Non-Medical): No  Physical Activity: Not on file  Stress: Not on file  Social  Connections: Not on file    There were no vitals filed for this visit. There is no height or weight on file to calculate BMI.  Physical Exam  ASSESSMENT AND PLAN:  [name] was seen today for an eczema flare up.   There are no diagnoses linked to this encounter.  No follow-ups on file.  I, Suanne Marker, acting as a scribe for Betty [place], MD., have documented all relevant documentation on the behalf of Betty [place], MD, as directed by  Betty [place], MD while in the presence of Betty [place], MD.   I, Suanne Marker, have reviewed all documentation for this visit. The documentation on 06/11/23 for the exam, diagnosis, procedures, and orders are all accurate and complete.  Betty G. [place], MD  [facility] [place] office.  Discharge Instructions   None

## 2023-06-12 ENCOUNTER — Ambulatory Visit: Payer: Medicaid Other | Admitting: Family Medicine

## 2023-06-12 ENCOUNTER — Encounter: Payer: Self-pay | Admitting: Adult Health

## 2023-06-12 ENCOUNTER — Ambulatory Visit: Payer: Medicaid Other | Admitting: Adult Health

## 2023-06-12 VITALS — BP 120/60 | HR 90 | Temp 99.7°F | Ht 66.0 in | Wt 191.0 lb

## 2023-06-12 DIAGNOSIS — L299 Pruritus, unspecified: Secondary | ICD-10-CM

## 2023-06-12 DIAGNOSIS — W57XXXA Bitten or stung by nonvenomous insect and other nonvenomous arthropods, initial encounter: Secondary | ICD-10-CM | POA: Diagnosis not present

## 2023-06-12 DIAGNOSIS — A599 Trichomoniasis, unspecified: Secondary | ICD-10-CM

## 2023-06-12 MED ORDER — METRONIDAZOLE 500 MG PO TABS
2000.0000 mg | ORAL_TABLET | Freq: Once | ORAL | 0 refills | Status: AC
Start: 2023-06-12 — End: 2023-06-12

## 2023-06-12 MED ORDER — HYDROXYZINE HCL 50 MG PO TABS
50.0000 mg | ORAL_TABLET | Freq: Three times a day (TID) | ORAL | 0 refills | Status: DC | PRN
Start: 2023-06-12 — End: 2024-03-27

## 2023-06-12 NOTE — Progress Notes (Signed)
Subjective:    Patient ID: Angela Hahn, female    DOB: Jan 09, 1976, 48 y.o.   MRN: [id number]  HPI 47 year old female, patient of Dr. Clent Ridges who I am seeing today for multiple issues.   She was seen 3 weeks ago at [facility] urgent care itching.  She stayed at her grandmother's house and then later found out that her grandmother had a bedbug infestation and she developed itching and bug bites after staying at the house.  The bugs were consistent with bedbugs.  She had noted bites on bilateral lower extremities, upper arms bilaterally, abdomen and face.  Was given a Solu-Medrol injection and Benadryl injection at urgent care and prescribed Zyrtec and a prednisone Dosepak to take orally.  He reports that she continues to have intense itching despite finishing her prednisone.  She has continued with Zyrtec and is also using topical prednisone creams and Benadryl cream without improvement.  She has not noticed any new bug bites.  He has been checking at home to make sure that she did not bring any bedbugs home with her.  About 6 weeks ago she was also seen in the emergency room for concern of STDs.  She ended up testing positive for trichomonas.  They prescribed her 2000 mg of Flagyl.  She reports that as soon as she took the Flagyl that she vomited back up and physically was able to see the pills.  She believes she vomited due to anxiety from having an STD.  She denies vaginal odor but has had some abdominal discomfort.  Review of Systems See HPI   Past Medical History:  Diagnosis Date   Anemia    Anxiety    Arthritis    7-8 years ago patient was told she had degenerative arthritis in her back   Chronic headaches    Concussion with loss of consciousness 09/24/2015   COVID-19 virus infection 03/08/2021   Dysmenorrhea    Heart murmur    dx'd since childhood   Sleep apnea    Stroke [facility])     Social History   Socioeconomic History   Marital status: Single    Spouse name: Not on  file   Number of children: 3   Years of education: Not on file   Highest education level: Some college, no degree  Occupational History   Not on file  Tobacco Use   Smoking status: Former    Current packs/day: 0.00    Types: Cigarettes    Quit date: 2020    Years since quitting: 4.7   Smokeless tobacco: Never  Vaping Use   Vaping status: Some Days  Substance and Sexual Activity   Alcohol use: No    Alcohol/week: 0.0 standard drinks of alcohol   Drug use: No   Sexual activity: Yes    Partners: Male    Birth control/protection: Surgical    Comment: Tubal  Other Topics Concern   Not on file  Social History Narrative   Caffeine- might have 1 c coffee   Social Determinants of Health   Financial Resource Strain: Not on file  Food Insecurity: No Food Insecurity (05/18/2022)   Hunger Vital Sign    Worried About Running Out of Food in the Last Year: Never true    Ran Out of Food in the Last Year: Never true  Transportation Needs: No Transportation Needs (05/18/2022)   PRAPARE - Administrator, Civil Service (Medical): No    Lack of Transportation (Non-Medical):  No  Physical Activity: Not on file  Stress: Not on file  Social Connections: Not on file  Intimate Partner Violence: Not on file    Past Surgical History:  Procedure Laterality Date   BUBBLE STUDY  07/14/2021   Procedure: BUBBLE STUDY;  Surgeon: Chilton Si, MD;  Location: [facility] ENDOSCOPY;  Service: Cardiovascular;;   HYSTERECTOMY ABDOMINAL WITH SALPINGECTOMY Bilateral 05/09/2022   Procedure: HYSTERECTOMY ABDOMINAL WITH BILATERAL SALPINGECTOMY;  Surgeon: [place] Bing, MD;  Location: [facility] OR;  Service: Gynecology;  Laterality: Bilateral;   LAPAROSCOPY N/A 05/09/2022   Procedure: LAPAROSCOPY DIAGNOSTIC;  Surgeon: Com[place] Bing, MD;  Location: [facility] OR;  Service: Gynecology;  Laterality: N/A;   TEE WITHOUT CARDIOVERSION N/A 07/14/2021   Procedure: TRANSESOPHAGEAL ECHOCARDIOGRAM (TEE);  Surgeon: Chilton Si, MD;  Location: [facility] ENDOSCOPY;  Service: Cardiovascular;  Laterality: N/A;   TUBAL LIGATION      Family History  Problem Relation Age of Onset   Arthritis Mother    Depression Mother    Hypertension Mother    Diabetes Mother    Hypertension Father    Diabetes Father     Allergies  Allergen Reactions   Gadolinium Derivatives Itching, Swelling and Cough   Hydrocodone Other (See Comments)    Itching, takes Benadryl with this medicine   Tramadol Other (See Comments)    headache    Current Outpatient Medications on File Prior to Visit  Medication Sig Dispense Refill   acetaminophen (TYLENOL) 325 MG tablet Take 2 tablets (650 mg total) by mouth every 6 (six) hours as needed. 30 tablet 1   aspirin EC 81 MG tablet Take 1 tablet (81 mg total) by mouth daily. Swallow whole. 90 tablet 3   atorvastatin (LIPITOR) 40 MG tablet TAKE 1 TABLET(40 MG) BY MOUTH DAILY 90 tablet 3   bisacodyl 5 MG EC tablet Take 1 tablet (5 mg total) by mouth daily as needed for moderate constipation. 10 tablet 0   diclofenac (VOLTAREN) 75 MG EC tablet Take 1 tablet (75 mg total) by mouth every 8 (eight) hours as needed for moderate pain. 60 tablet 2   diltiazem (CARDIZEM CD) 180 MG 24 hr capsule TAKE 1 CAPSULE BY MOUTH DAILY 90 capsule 3   LORazepam (ATIVAN) 1 MG tablet TAKE 1 TABLET BY MOUTH EVERY 8 HOURS AS NEEDED FOR ANXIETY 90 tablet 5   meclizine (ANTIVERT) 25 MG tablet Take 1 tablet (25 mg total) by mouth 3 (three) times daily as needed for dizziness. 30 tablet 0   metoprolol succinate (TOPROL-XL) 25 MG 24 hr tablet Take 0.5 tablets (12.5 mg total) by mouth daily. Make an appointment with Cardiologist for further refills. 30 tablet 0   polyethylene glycol (MIRALAX / GLYCOLAX) 17 g packet Take 17 g by mouth daily. 30 each 1   triamcinolone cream (KENALOG) 0.1 % Apply 1 Application topically 2 (two) times daily as needed (eczema). 15 g 0   simethicone (MYLICON) 80 MG chewable tablet Chew 1 tablet (80 mg  total) by mouth 2 (two) times daily after a meal for 14 days. 28 tablet 0   [DISCONTINUED] Cetirizine HCl 10 MG CAPS Take 1 capsule (10 mg total) by mouth daily. 15 capsule 0   No current facility-administered medications on file prior to visit.    BP 120/60   Pulse 90   Temp 99.7 F (37.6 C) (Oral)   Ht 5\' 6"  (1.676 m)   Wt 191 lb (86.6 kg)   LMP 04/26/2022 Comment: Urine preg negative 05/09/22  SpO2  97%   BMI 30.83 kg/m       Objective:   Physical Exam Vitals and nursing note reviewed.  Constitutional:      Appearance: Normal appearance.  Skin:    General: Skin is warm and dry.     Comments: Small circular hyperpigmented aided areas throughout bilateral legs, arms, and torso  Neurological:     General: No focal deficit present.     Mental Status: She is alert and oriented to person, place, and time.  Psychiatric:        Mood and Affect: Mood normal.        Behavior: Behavior normal.        Thought Content: Thought content normal.        Judgment: Judgment normal.           Assessment & Plan:  1. Itching  -- hydrOXYzine (ATARAX) 50 MG tablet; Take 1 tablet (50 mg total) by mouth 3 (three) times daily as needed.  Dispense: 30 tablet; Refill: 0  2. Bedbug bite, initial encounter - no new bug bites noted   3. Trichomoniasis - Will re prescribe her Flagyl  - metroNIDAZOLE (FLAGYL) 500 MG tablet; Take 4 tablets (2,000 mg total) by mouth once for 1 dose.  Dispense: 4 tablet; Refill: 0  Shirline Frees, NP

## 2023-08-08 ENCOUNTER — Ambulatory Visit: Payer: Medicaid Other | Admitting: Family Medicine

## 2023-08-08 ENCOUNTER — Encounter: Payer: Self-pay | Admitting: Family Medicine

## 2023-08-08 VITALS — BP 122/80 | HR 79 | Temp 98.9°F | Wt 193.4 lb

## 2023-08-08 DIAGNOSIS — L309 Dermatitis, unspecified: Secondary | ICD-10-CM | POA: Diagnosis not present

## 2023-08-08 DIAGNOSIS — B379 Candidiasis, unspecified: Secondary | ICD-10-CM

## 2023-08-08 MED ORDER — KETOCONAZOLE 2 % EX CREA: 1.0000 | TOPICAL_CREAM | Freq: Two times a day (BID) | CUTANEOUS | 2 refills | Status: DC | PRN

## 2023-08-08 MED ORDER — BETAMETHASONE DIPROPIONATE AUG 0.05 % EX CREA: TOPICAL_CREAM | Freq: Two times a day (BID) | CUTANEOUS | 2 refills | Status: DC | PRN

## 2023-08-08 NOTE — Progress Notes (Signed)
   Subjective:    Patient ID: Angela Hahn, female    DOB: 27-Apr-1976, 47 y.o.   MRN: [id number]  HPI Here for 2 different rashes. She has a long hx of eczema, and she normally applies Triamcinolone cream for this. However over the past few months this no longer works as well as it used to. Also 2 months ago she developed an itchy rash in both groins and under her abdomen. This has not responded to Triamcinolone either.    Review of Systems  Constitutional: Negative.   Respiratory: Negative.    Cardiovascular: Negative.   Skin:  Positive for rash.       Objective:   Physical Exam Constitutional:      Appearance: Normal appearance.  Cardiovascular:     Rate and Rhythm: Normal rate and regular rhythm.     Pulses: Normal pulses.     Heart sounds: Normal heart sounds.  Pulmonary:     Effort: Pulmonary effort is normal.     Breath sounds: Normal breath sounds.  Skin:    Comments: There are scattered areas of hyperpigmented scaly skin on the neck and trunk. There are also areas of macular erythema under the pannus and in both groin areas   Neurological:     Mental Status: She is alert.           Assessment & Plan:  Her eczema has worsened a bit, so we will stop Triamcinolone and she can apply Diprolene AF 0.05% cream as needed. She also has a Candidal rash in the skin folds, and we will treat this with Ketoconazole cream as needed.  Gershon Crane, MD

## 2023-08-13 ENCOUNTER — Telehealth: Payer: Self-pay

## 2023-08-13 ENCOUNTER — Other Ambulatory Visit (HOSPITAL_COMMUNITY): Payer: Self-pay

## 2023-08-13 NOTE — Telephone Encounter (Addendum)
Pharmacy Patient Advocate Encounter   Received notification from Fax that prior authorization for Diclofenac 75MG  DR is required/requested.   Insurance verification completed.   The patient is insured through  [employer]  .   Per test claim: The current 20 day co-pay is, $6.47.  No PA needed at this time. This test claim was processed through [facility]- copay amounts may vary at other pharmacies due to pharmacy/plan contracts, or as the patient moves through the different stages of their insurance plan.     Pharmacy aware

## 2023-08-13 NOTE — Telephone Encounter (Addendum)
Pharmacy Patient Advocate Encounter   Received notification from Fax that prior authorization for Betamethasone Dip 0.05% Aug Cream is required/requested.   Insurance verification completed.   The patient is insured through  [employer]  .   Per test claim: The current 30 day co-pay is, $8.34.  No PA needed at this time. This test claim was processed through [facility]- copay amounts may vary at other pharmacies due to pharmacy/plan contracts, or as the patient moves through the different stages of their insurance plan.      Pharmacy aware

## 2023-10-24 ENCOUNTER — Ambulatory Visit: Payer: Medicaid Other | Admitting: Family Medicine

## 2023-10-29 ENCOUNTER — Ambulatory Visit: Payer: Medicaid Other | Admitting: Family Medicine

## 2023-12-02 ENCOUNTER — Other Ambulatory Visit: Payer: Self-pay | Admitting: Family Medicine

## 2023-12-02 ENCOUNTER — Other Ambulatory Visit: Payer: Self-pay | Admitting: Cardiology

## 2023-12-04 ENCOUNTER — Other Ambulatory Visit: Payer: Self-pay | Admitting: Cardiology

## 2024-01-05 ENCOUNTER — Other Ambulatory Visit: Payer: Self-pay | Admitting: Cardiology

## 2024-01-07 NOTE — Telephone Encounter (Signed)
 Rx refill sent to pharmacy.

## 2024-01-17 ENCOUNTER — Other Ambulatory Visit: Payer: Self-pay

## 2024-01-17 ENCOUNTER — Other Ambulatory Visit (HOSPITAL_BASED_OUTPATIENT_CLINIC_OR_DEPARTMENT_OTHER): Payer: Self-pay

## 2024-01-17 ENCOUNTER — Emergency Department (HOSPITAL_BASED_OUTPATIENT_CLINIC_OR_DEPARTMENT_OTHER)
Admission: EM | Admit: 2024-01-17 | Discharge: 2024-01-17 | Disposition: A | Discharge status: Home / Self Care | Attending: Emergency Medicine | Admitting: Emergency Medicine

## 2024-01-17 ENCOUNTER — Encounter (HOSPITAL_BASED_OUTPATIENT_CLINIC_OR_DEPARTMENT_OTHER): Payer: Self-pay

## 2024-01-17 ENCOUNTER — Emergency Department (HOSPITAL_BASED_OUTPATIENT_CLINIC_OR_DEPARTMENT_OTHER): Admitting: Radiology

## 2024-01-17 DIAGNOSIS — M79604 Pain in right leg: Secondary | ICD-10-CM | POA: Insufficient documentation

## 2024-01-17 DIAGNOSIS — Z7982 Long term (current) use of aspirin: Secondary | ICD-10-CM | POA: Insufficient documentation

## 2024-01-17 DIAGNOSIS — R002 Palpitations: Secondary | ICD-10-CM | POA: Diagnosis not present

## 2024-01-17 DIAGNOSIS — I10 Essential (primary) hypertension: Secondary | ICD-10-CM | POA: Diagnosis not present

## 2024-01-17 DIAGNOSIS — R079 Chest pain, unspecified: Secondary | ICD-10-CM | POA: Insufficient documentation

## 2024-01-17 DIAGNOSIS — Z79899 Other long term (current) drug therapy: Secondary | ICD-10-CM | POA: Insufficient documentation

## 2024-01-17 LAB — CBC
HCT: 42.4 % (ref 36.0–46.0)
Hemoglobin: 13.3 g/dL (ref 12.0–15.0)
MCH: 26.7 pg (ref 26.0–34.0)
MCHC: 31.4 g/dL (ref 30.0–36.0)
MCV: 85.1 fL (ref 80.0–100.0)
Platelets: 235 10*3/uL (ref 150–400)
RBC: 4.98 MIL/uL (ref 3.87–5.11)
RDW: 13.3 % (ref 11.5–15.5)
WBC: 4.4 10*3/uL (ref 4.0–10.5)
nRBC: 0 % (ref 0.0–0.2)

## 2024-01-17 LAB — BASIC METABOLIC PANEL WITH GFR
Anion gap: 14 (ref 5–15)
BUN: 12 mg/dL (ref 6–20)
CO2: 23 mmol/L (ref 22–32)
Calcium: 9.9 mg/dL (ref 8.9–10.3)
Chloride: 105 mmol/L (ref 98–111)
Creatinine, Ser: 0.7 mg/dL (ref 0.44–1.00)
GFR, Estimated: >60 mL/min (ref >60)
Glucose, Bld: 97 mg/dL (ref 70–99)
Potassium: 3.5 mmol/L (ref 3.5–5.1)
Sodium: 142 mmol/L (ref 135–145)

## 2024-01-17 LAB — D-DIMER, QUANTITATIVE: D-Dimer, Quant: 0.3 ug{FEU}/mL (ref 0.00–0.50)

## 2024-01-17 LAB — TROPONIN T, HIGH SENSITIVITY: Troponin T High Sensitivity: <15 ng/L (ref <19)

## 2024-01-17 MED ORDER — METHOCARBAMOL 500 MG PO TABS
500.0000 mg | ORAL_TABLET | Freq: Two times a day (BID) | ORAL | 0 refills | Status: DC
  Filled 2024-01-17: qty 20, 10d supply, fill #0

## 2024-01-17 MED ORDER — NAPROXEN 500 MG PO TABS
500.0000 mg | ORAL_TABLET | Freq: Two times a day (BID) | ORAL | 0 refills | Status: DC
  Filled 2024-01-17: qty 30, 15d supply, fill #0

## 2024-01-17 MED ORDER — KETOROLAC TROMETHAMINE 15 MG/ML IJ SOLN
15.0000 mg | Freq: Once | INTRAMUSCULAR | Status: AC
  Administered 2024-01-17: 15 mg via INTRAMUSCULAR
  Filled 2024-01-17: qty 1

## 2024-01-17 NOTE — ED Notes (Signed)
Reviewed discharge instructions, medications, and home care with pt. Pt verbalized understanding and had no further questions. Pt exited ED without complications.

## 2024-01-17 NOTE — ED Notes (Signed)
 ED Provider at bedside.

## 2024-01-17 NOTE — ED Provider Notes (Signed)
 [place] EMERGENCY DEPARTMENT AT [facility] Provider Note   CSN: [id number] Arrival date & time: 01/17/24  1306     History  Chief Complaint  Patient presents with   Leg Pain    right   Palpitations    ELNITA SURPRENANT is a 48 y.o. female.   Leg Pain Palpitations Associated symptoms: leg pain   Patient is a 48 year old female presents the ED today with complaints of a 3-day history of intermittent chest pain, shortness of breath, as well complaining of a right posterior leg pain present x 1 week.  Previous medical history of CVA, anxiety, anemia, chronic headaches.  Chest pain is intermittent and is not worse on exertion.  Notes that she has been previously exercising a lot more, attempting to lose weight and being successful at it.  Notes that she has been walking extensively every day but that approximately 1 week ago when the right leg pain began noticed that she tried to stop a washer from falling down the stairs and braced her leg, feeling like she might of pulled a muscle in that leg when it happened.  Notes the pain is all gotten worse and is described as "sharp and shooting."  Has still been able to exercise on the leg since the injury. Endorses nausea. Denies fever, headache, vision changes, abdominal pain, vomiting, dysuria, diarrhea, hematuria, lower leg swelling, long trips.    Home Medications Prior to Admission medications   Medication Sig Start Date End Date Taking? Authorizing Provider  methocarbamol (ROBAXIN) 500 MG tablet Take 1 tablet (500 mg total) by mouth 2 (two) times daily. 01/17/24  Yes Hayes Lipps, PA-C  naproxen  (NAPROSYN ) 500 MG tablet Take 1 tablet (500 mg total) by mouth 2 (two) times daily. 01/17/24  Yes Ignacio Lowder S, PA-C  acetaminophen  (TYLENOL ) 325 MG tablet Take 2 tablets (650 mg total) by mouth every 6 (six) hours as needed. 05/12/22   Raynell Caller, MD  aspirin  EC 81 MG tablet Take 1 tablet (81 mg total) by mouth daily. Swallow  whole. 08/08/22   Donley Furth, MD  atorvastatin  (LIPITOR) 40 MG tablet TAKE 1 TABLET(40 MG) BY MOUTH DAILY 05/08/23   Donley Furth, MD  augmented betamethasone  dipropionate (DIPROLENE  AF) 0.05 % cream Apply topically 2 (two) times daily as needed (eczema). 08/08/23   Donley Furth, MD  bisacodyl  5 MG EC tablet Take 1 tablet (5 mg total) by mouth daily as needed for moderate constipation. 05/12/22   Raynell Caller, MD  diclofenac  (VOLTAREN ) 75 MG EC tablet Take 1 tablet (75 mg total) by mouth every 8 (eight) hours as needed for moderate pain. 02/28/23   Donley Furth, MD  diltiazem  (CARDIZEM  CD) 180 MG 24 hr capsule Take 1 capsule (180 mg total) by mouth daily. Pt needs office visit before future refills. 2nd Attempt 01/07/24   Tobb, Kardie, DO  hydrOXYzine  (ATARAX ) 50 MG tablet Take 1 tablet (50 mg total) by mouth 3 (three) times daily as needed. 06/12/23   Nafziger, Randel Buss, NP  ketoconazole  (NIZORAL ) 2 % cream Apply 1 Application topically 2 (two) times daily as needed (yeast infection). 08/08/23   Donley Furth, MD  LORazepam  (ATIVAN ) 1 MG tablet TAKE 1 TABLET BY MOUTH EVERY 8 HOURS AS NEEDED FOR ANXIETY 12/03/23   Donley Furth, MD  meclizine  (ANTIVERT ) 25 MG tablet Take 1 tablet (25 mg total) by mouth 3 (three) times daily as needed for dizziness. 02/20/23   Harris, Abigail, PA-C  metoprolol  succinate (TOPROL -XL) 25 MG 24 hr tablet Take 0.5 tablets (12.5 mg total) by mouth daily. Make an appointment with Cardiologist for further refills. 10/23/22   Tobb, Kardie, DO  polyethylene glycol (MIRALAX  / GLYCOLAX ) 17 g packet Take 17 g by mouth daily. 06/19/22   Raynell Caller, MD  simethicone  (MYLICON) 80 MG chewable tablet Chew 1 tablet (80 mg total) by mouth 2 (two) times daily after a meal for 14 days. 05/12/22 05/26/22  Raynell Caller, MD  triamcinolone  cream (KENALOG ) 0.1 % Apply 1 Application topically 2 (two) times daily as needed (eczema). 11/14/22   Donley Furth, MD  Cetirizine  HCl 10 MG CAPS  Take 1 capsule (10 mg total) by mouth daily. 08/28/19 12/17/20  Wieters, Hallie C, PA-C      Allergies    Gadolinium derivatives, Hydrocodone , and Tramadol     Review of Systems   Review of Systems  Cardiovascular:  Positive for palpitations.  All other systems reviewed and are negative.   Physical Exam Updated Vital Signs BP (!) 150/91   Pulse 63   Temp 98 F (36.7 C)   Resp 18   Ht 5\' 6"  (1.676 m)   Wt 79.8 kg   LMP 04/26/2022 Comment: Urine preg negative 05/09/22  SpO2 100%   BMI 28.41 kg/m  Physical Exam Vitals and nursing note reviewed.  Constitutional:      General: She is not in acute distress.    Appearance: Normal appearance. She is not ill-appearing or diaphoretic.  HENT:     Head: Normocephalic and atraumatic.  Eyes:     General: No scleral icterus.       Right eye: No discharge.        Left eye: No discharge.     Extraocular Movements: Extraocular movements intact.     Conjunctiva/sclera: Conjunctivae normal.  Cardiovascular:     Rate and Rhythm: Normal rate and regular rhythm.     Pulses: Normal pulses.     Heart sounds: Normal heart sounds. No murmur heard.    No friction rub. No gallop.  Pulmonary:     Effort: Pulmonary effort is normal. No respiratory distress.     Breath sounds: Normal breath sounds. No stridor. No wheezing, rhonchi or rales.  Abdominal:     General: Abdomen is flat. There is no distension.     Palpations: Abdomen is soft.     Tenderness: There is no abdominal tenderness. There is no right CVA tenderness, left CVA tenderness or guarding.  Musculoskeletal:        General: Tenderness (Point tenderness noted to the right posterior thigh.) present. No swelling, deformity or signs of injury.     Cervical back: Normal range of motion and neck supple. No rigidity or tenderness.     Right lower leg: No edema.     Left lower leg: No edema.  Skin:    General: Skin is warm and dry.     Coloration: Skin is not pale.     Findings: No  bruising, erythema, lesion or rash.  Neurological:     General: No focal deficit present.     Mental Status: She is alert and oriented to person, place, and time. Mental status is at baseline.     Cranial Nerves: No cranial nerve deficit.     Sensory: No sensory deficit.     Motor: No weakness.     Coordination: Coordination normal.     Gait: Gait normal.     Comments: No facial  asymmetry, no ataxia, no apraxia, no aphasia, no arm drift, normal coordination with finger-to-nose, normal sensation to both upper and lower extremities bilaterally, normal grip strength bilaterally, normal strength to both flexion and extension to both upper lower extremities 5+ bilaterally, no visual field deficits, no nystagmus.   Psychiatric:        Mood and Affect: Mood normal.     ED Results / Procedures / Treatments   Labs (all labs ordered are listed, but only abnormal results are displayed) Labs Reviewed  BASIC METABOLIC PANEL WITH GFR  CBC  D-DIMER, QUANTITATIVE  TROPONIN T, HIGH SENSITIVITY    EKG None  Radiology DG Chest 2 View Result Date: 01/17/2024 CLINICAL DATA:  Palpitations. EXAM: CHEST - 2 VIEW COMPARISON:  09/30/2021 FINDINGS: The cardiomediastinal contours are normal. The lungs are clear. Pulmonary vasculature is normal. No consolidation, pleural effusion, or pneumothorax. No acute osseous abnormalities are seen. IMPRESSION: No active cardiopulmonary disease. Electronically Signed   By: Chadwick Colonel M.D.   On: 01/17/2024 15:23    Procedures Procedures    Medications Ordered in ED Medications  ketorolac  (TORADOL ) 15 MG/ML injection 15 mg (has no administration in time range)    ED Course/ Medical Decision Making/ A&P                                 Medical Decision Making Amount and/or Complexity of Data Reviewed Labs: ordered. Radiology: ordered.   This patient is a 48 year old female who presents to the ED for concern of chest pain and shortness of breath  intermittent x 3 days, with last episode being last night, noting to be accompanied with nausea.  Not worse on exertion.  Previous medical history of CVA/stroke.  Also complaining of right leg pain after straining her leg while moving a washer.  On physical exam, patient is in no acute distress, afebrile, alert and orient x 4, speaking in full sentences, nontachypneic, nontachycardic.  LCTAB, no chest wall tenderness to palpation, no crepitus.  Abdomen is nontender, no peritoneal signs.  No CVA tenderness.  Normal neuroexam.  No lower leg swelling.  Unremarkable physical exam otherwise.  Patient's labs imaging were all unremarkable.  I have low suspicion for any emergent causes for her symptoms today.  On reevaluation, patient notes that she says that she normally gets this way when she gets incredibly anxious and needed to use her Ativan  more often.  Will recommend that she continue to monitor symptoms at home Ambien follow with PCP for persistent symptoms and return to the ED for any new or worsening symptoms.  Will send home with muscle laxer and naproxen  for pain relief in right leg.  Patient vital signs have remained stable throughout the course of patient's time in the ED. Low suspicion for any other emergent pathology at this time. I believe this patient is safe to be discharged. Provided strict return to ER precautions. Patient expressed agreement and understanding of plan. All questions were answered.  Differential diagnoses prior to evaluation: The emergent differential diagnosis includes, but is not limited to, ACS, PE, muscle strain, DVT, pneumonia, AAS, anxiety. This is not an exhaustive differential.   Past Medical History / Co-morbidities / Social History: Chronic low back pain, menorrhagia, CVA, anxiety, HTN, status post hysterectomy, anemia, heart murmur Additional history: Chart reviewed.  Lab Tests/Imaging studies: I personally interpreted labs/imaging and the pertinent results  include:    CBC unremarkable BMP unremarkable  D-dimer unremarkable Troponin remarkable Chest x-ray unremarkable I agree with the radiologist interpretation.  Medications: I ordered medication including Toradol .  I have reviewed the patients home medicines and have made adjustments as needed.   Disposition: After consideration of the diagnostic results and the patients response to treatment, I feel that the patient would benefit from discharge and treatment as above.   emergency department workup does not suggest an emergent condition requiring admission or immediate intervention beyond what has been performed at this time. The plan is: Follow-up PCP for persistent symptoms, return to the ED for any new or worsening symptoms, symptomatic management at home. The patient is safe for discharge and has been instructed to return immediately for worsening symptoms, change in symptoms or any other concerns.   Final Clinical Impression(s) / ED Diagnoses Final diagnoses:  Chest pain, unspecified type  Right leg pain    Rx / DC Orders ED Discharge Orders          Ordered    methocarbamol (ROBAXIN) 500 MG tablet  2 times daily        01/17/24 1639    naproxen  (NAPROSYN ) 500 MG tablet  2 times daily        01/17/24 1639              Hayes Lipps, PA-C 01/17/24 1641    Deatra Face, MD 01/18/24 1456

## 2024-01-17 NOTE — Discharge Instructions (Signed)
 You are seen today for a right leg pain and chest pain.  Your labs, imaging, physical exam were reassuring that have low suspicion for any emergent causes for your symptoms today.  Suspect that the pain in her legs most likely due to the muscle soreness after your workout and strain when lifting the washer.  Suspect that your chest pain and shortness of breath is likely due to to your anxiety as your visit today was very reassuring.  I am prescribing a muscle relaxer and anti-inflammatory to use for the pain in your leg.   Please take Robaxin, 500 mg up to twice a day as needed for muscle spasm, this is a muscle relaxer, it may cause generalized weakness, sleepiness and you should not drive or do important things while taking this medication. Please take Naprosyn , 500mg  by mouth twice daily as needed for pain - this in an antiinflammatory medicine (NSAID) and is similar to ibuprofen  - many people feel that it is stronger than ibuprofen  and it is easier to take since it is a smaller pill.  Please use this only for 1 week - if your pain persists, you will need to follow up with your doctor in the office for ongoing guidance and pain control.    Return to the ED for new or worsening symptoms do not take Tylenol  to further assist with pain relief.

## 2024-01-17 NOTE — ED Triage Notes (Signed)
 Patient arrives POV with complaints of right leg pain x2 weeks. No falls or injuries. Rates pain a 10/10.  ---also reports intermittent palpitations, nausea, and weakness as well.

## 2024-01-31 ENCOUNTER — Ambulatory Visit: Payer: Self-pay | Admitting: *Deleted

## 2024-01-31 NOTE — Telephone Encounter (Signed)
 Pt scheduled with Dr. Swaziland because of severe pain in right leg and foot and requesting to be seen sooner than what Dr. Alyne Hahn had.   Pt seen in ED 01/17/2024 for chest pain and right leg pain which is what she is calling in for today, the leg pain.  FYI Only or Action Required?: FYI only for provider  Patient was last seen in primary care on 08/08/2023 by Angela Furth, MD. Called Nurse Triage reporting Leg Pain. Symptoms began several weeks ago. Interventions attempted: Prescription medications: Medication given to her in ED, Advil , heat, cold, icy hot. Symptoms are: rapidly worsening.  Triage Disposition: See PCP When Office is Open (Within 3 Days)  Patient/caregiver understands and will follow disposition?:  Yes     Copied from CRM [id number]. Topic: Clinical - Red Word Triage >> Jan 31, 2024  3:29 PM Angela Hahn wrote: Red Word that prompted transfer to Nurse Triage: severe pain   Patient stated she is having severe pain in her leg that radiates to her foot Reason for Disposition  [1] MODERATE pain (e.g., interferes with normal activities, limping) AND [2] present > 3 days  Answer Assessment - Initial Assessment Questions 1. ONSET: "When did the pain start?"      Right leg is having pain down leg going down into my foot.  It's been going on for over a month.   I went to the ED and they told me to see my PCP.    I helped stop a washing machine from falling down the steps.   It's been 2 months really since this started.     2. LOCATION: "Where is the pain located?"      Right leg down into my foot.   It's not getting better.   The pain medicine does not help at all.     3. PAIN: "How bad is the pain?"    (Scale 1-10; or mild, moderate, severe)   -  MILD (1-3): doesn't interfere with normal activities    -  MODERATE (4-7): interferes with normal activities (e.g., work or school) or awakens from sleep, limping    -  SEVERE (8-10): excruciating pain, unable to do any normal activities, unable  to walk     Severe 4. WORK OR EXERCISE: "Has there been any recent work or exercise that involved this part of the body?"      2 months ago I tried to stop a washing machine from falling.   I've had a stroke before.  The medicines do not help 5. CAUSE: "What do you think is causing the leg pain?"     See above 6. OTHER SYMPTOMS: "Do you have any other symptoms?" (e.g., chest pain, back pain, breathing difficulty, swelling, rash, fever, numbness, weakness)     Sharp pains in my right leg that radiates down into my foot. 7. PREGNANCY: "Is there any chance you are pregnant?" "When was your last menstrual period?"     Not asked  Protocols used: Leg Pain-A-AH

## 2024-02-01 ENCOUNTER — Ambulatory Visit

## 2024-02-01 ENCOUNTER — Encounter: Payer: Self-pay | Admitting: Family Medicine

## 2024-02-01 ENCOUNTER — Other Ambulatory Visit (HOSPITAL_BASED_OUTPATIENT_CLINIC_OR_DEPARTMENT_OTHER): Payer: Self-pay

## 2024-02-01 ENCOUNTER — Ambulatory Visit: Admitting: Family Medicine

## 2024-02-01 VITALS — BP 128/80 | HR 82 | Resp 16 | Ht 66.0 in | Wt 190.5 lb

## 2024-02-01 DIAGNOSIS — M79605 Pain in left leg: Secondary | ICD-10-CM

## 2024-02-01 DIAGNOSIS — M79604 Pain in right leg: Secondary | ICD-10-CM

## 2024-02-01 MED ORDER — PREDNISONE 20 MG PO TABS
ORAL_TABLET | ORAL | 0 refills | Status: AC
  Filled 2024-02-01: qty 20, 12d supply, fill #0

## 2024-02-01 MED ORDER — OXYCODONE-ACETAMINOPHEN 5-325 MG PO TABS
1.0000 | ORAL_TABLET | Freq: Three times a day (TID) | ORAL | 0 refills | Status: AC | PRN
  Filled 2024-02-01: qty 15, 5d supply, fill #0

## 2024-02-01 NOTE — Telephone Encounter (Signed)
 Pt had appointment with Dr Swaziland this morning for this

## 2024-02-01 NOTE — Patient Instructions (Addendum)
 A few things to remember from today's visit:  Pain in both lower extremities - Plan: predniSONE  (DELTASONE ) 20 MG tablet, DG Lumbar Spine Complete, oxyCODONE -acetaminophen  (PERCOCET/ROXICET) 5-325 MG tablet  Right thigh pain - Plan: oxyCODONE -acetaminophen  (PERCOCET/ROXICET) 5-325 MG tablet  Prednisone  with breakfast. Do not take Acetaminophen  if taking Percocet and avoid Lorazepam  when taking pain medication. Monitor for new symptoms.  Please arrange an apt with Dr Alyne Babinski for 2 weeks follow up.  If you need refills for medications you take chronically, please call your pharmacy. Do not use My Chart to request refills or for acute issues that need immediate attention. If you send a my chart message, it may take a few days to be addressed, specially if I am not in the office.  Please be sure medication list is accurate. If a new problem present, please set up appointment sooner than planned today.

## 2024-02-01 NOTE — Progress Notes (Signed)
 ACUTE VISIT Chief Complaint  Patient presents with   Leg Pain    Right leg, severe. Pulled a muscle a few months ago when moving washer, has progressively gotten worse recently    HPI: Angela Hahn [employer] is a 48 y.o. female with a PMHx significant for HTN, CVA, HLD, iron  deficiency anemia, anxiety, and chronic back pain, who is here today for leg pain.  Patient complains of right leg pain she has had for a few months , worse for the past month or so.  At the onset, she only had pain in the back of her thigh, but eventually the pain spread to the front of her thigh, and is now going down her extremity with foot tingling. She is also having milder pain of left thigh. She is limping due to the pain, and it is interfering with her sleep.  The pain is worse with movement or when her knee is bent.  It is alleviated with rest. Right now, she rates the pain as a 4/10, but says it can get worse.  She describes it as a throbbing and stabbing pain, and says over the last few days she has developed muscle spasms.   Also mentions she occasionally has similar pain shifting to her left leg, but it is not as severe or frequent.  Has been taking tylenol  and ibuprofen . No longer taking diclofenac .  She went to the ED on 5/22 for this pain, which she noted was worse the days after she tried to stop a washer from falling down the stairs, and did not have any pain while doing so. At the time of her ED visit she was also having some chest discomfort, today she reports that this has resolved. She denies palpitation, dyspnea, lower extremity edema/erythema, abdominal pain, or urinary symptoms. Lab Results  Component Value Date   DDIMER 0.30 01/17/2024   Lab Results  Component Value Date   NA 142 01/17/2024   CL 105 01/17/2024   K 3.5 01/17/2024   CO2 23 01/17/2024   BUN 12 01/17/2024   CREATININE 0.70 01/17/2024   GFRNONAA >60 01/17/2024   CALCIUM  9.9 01/17/2024   ALBUMIN 4.3 05/03/2023   GLUCOSE 97  01/17/2024   Lab Results  Component Value Date   WBC 4.4 01/17/2024   HGB 13.3 01/17/2024   HCT 42.4 01/17/2024   MCV 85.1 01/17/2024   PLT 235 01/17/2024   High sensitive troponin < 15. She was given methocarbamol  and naproxen  in the ED, but says they have not helped.  She has history of lower back pain but has not been a problem lately.  Review of Systems  Constitutional:  Positive for activity change. Negative for appetite change, chills and fever.  HENT:  Negative for sore throat.   Respiratory:  Negative for cough and wheezing.   Gastrointestinal:  Negative for abdominal pain, nausea and vomiting.  Genitourinary:  Negative for decreased urine volume, dysuria and hematuria.  Skin:  Negative for rash.  Neurological:  Negative for syncope and facial asymmetry.  See other pertinent positives and negatives in HPI.  Current Outpatient Medications on File Prior to Visit  Medication Sig Dispense Refill   acetaminophen  (TYLENOL ) 325 MG tablet Take 2 tablets (650 mg total) by mouth every 6 (six) hours as needed. 30 tablet 1   aspirin  EC 81 MG tablet Take 1 tablet (81 mg total) by mouth daily. Swallow whole. 90 tablet 3   atorvastatin  (LIPITOR) 40 MG tablet TAKE 1 TABLET(40 MG)  BY MOUTH DAILY 90 tablet 3   augmented betamethasone  dipropionate (DIPROLENE  AF) 0.05 % cream Apply topically 2 (two) times daily as needed (eczema). 50 g 2   bisacodyl  5 MG EC tablet Take 1 tablet (5 mg total) by mouth daily as needed for moderate constipation. 10 tablet 0   diltiazem  (CARDIZEM  CD) 180 MG 24 hr capsule Take 1 capsule (180 mg total) by mouth daily. Pt needs office visit before future refills. 2nd Attempt 30 capsule 0   hydrOXYzine  (ATARAX ) 50 MG tablet Take 1 tablet (50 mg total) by mouth 3 (three) times daily as needed. 30 tablet 0   ketoconazole  (NIZORAL ) 2 % cream Apply 1 Application topically 2 (two) times daily as needed (yeast infection). 60 g 2   LORazepam  (ATIVAN ) 1 MG tablet TAKE 1 TABLET  BY MOUTH EVERY 8 HOURS AS NEEDED FOR ANXIETY 90 tablet 5   meclizine  (ANTIVERT ) 25 MG tablet Take 1 tablet (25 mg total) by mouth 3 (three) times daily as needed for dizziness. 30 tablet 0   methocarbamol  (ROBAXIN ) 500 MG tablet Take 1 tablet (500 mg total) by mouth 2 (two) times daily. 20 tablet 0   metoprolol  succinate (TOPROL -XL) 25 MG 24 hr tablet Take 0.5 tablets (12.5 mg total) by mouth daily. Make an appointment with Cardiologist for further refills. 30 tablet 0   polyethylene glycol (MIRALAX  / GLYCOLAX ) 17 g packet Take 17 g by mouth daily. 30 each 1   triamcinolone  cream (KENALOG ) 0.1 % Apply 1 Application topically 2 (two) times daily as needed (eczema). 15 g 0   simethicone  (MYLICON) 80 MG chewable tablet Chew 1 tablet (80 mg total) by mouth 2 (two) times daily after a meal for 14 days. 28 tablet 0   [DISCONTINUED] Cetirizine  HCl 10 MG CAPS Take 1 capsule (10 mg total) by mouth daily. 15 capsule 0   No current facility-administered medications on file prior to visit.    Past Medical History:  Diagnosis Date   Anemia    Anxiety    Arthritis    7-8 years ago patient was told she had degenerative arthritis in her back   Chronic headaches    Concussion with loss of consciousness 09/24/2015   COVID-19 virus infection 03/08/2021   Dysmenorrhea    Heart murmur    dx'd since childhood   Sleep apnea    Stroke (HCC)    Allergies  Allergen Reactions   Gadolinium Derivatives Itching, Swelling and Cough   Hydrocodone  Other (See Comments)    Itching, takes Benadryl  with this medicine   Tramadol  Other (See Comments)    headache    Social History   Socioeconomic History   Marital status: Single    Spouse name: Not on file   Number of children: 3   Years of education: Not on file   Highest education level: Some college, no degree  Occupational History   Not on file  Tobacco Use   Smoking status: Former    Current packs/day: 0.00    Types: Cigarettes    Quit date: 2020     Years since quitting: 5.4   Smokeless tobacco: Never  Vaping Use   Vaping status: Some Days  Substance and Sexual Activity   Alcohol use: No    Alcohol/week: 0.0 standard drinks of alcohol   Drug use: No   Sexual activity: Yes    Partners: Male    Birth control/protection: Surgical    Comment: Tubal  Other Topics Concern   Not  on file  Social History Narrative   Caffeine - might have 1 c coffee   Social Drivers of Corporate investment banker Strain: Not on file  Food Insecurity: No Food Insecurity (05/18/2022)   Hunger Vital Sign    Worried About Running Out of Food in the Last Year: Never true    Ran Out of Food in the Last Year: Never true  Transportation Needs: No Transportation Needs (05/18/2022)   PRAPARE - Administrator, Civil Service (Medical): No    Lack of Transportation (Non-Medical): No  Physical Activity: Not on file  Stress: Not on file  Social Connections: Not on file   Vitals:   02/01/24 0804  BP: 128/80  Pulse: 82  Resp: 16  SpO2: 99%   Body mass index is 30.75 kg/m.  Physical Exam Vitals and nursing note reviewed.  Constitutional:      General: She is not in acute distress.    Appearance: She is well-developed.  HENT:     Head: Normocephalic and atraumatic.  Eyes:     Conjunctiva/sclera: Conjunctivae normal.  Cardiovascular:     Rate and Rhythm: Normal rate and regular rhythm.     Pulses:          Dorsalis pedis pulses are 2+ on the right side and 2+ on the left side.     Heart sounds: No murmur heard. Pulmonary:     Effort: Pulmonary effort is normal. No respiratory distress.     Breath sounds: Normal breath sounds.  Abdominal:     Palpations: Abdomen is soft. There is no mass.     Tenderness: There is no abdominal tenderness.  Musculoskeletal:     Lumbar back: No tenderness or bony tenderness. Negative right straight leg raise test and negative left straight leg raise test.     Right upper leg: Tenderness present.     Left  upper leg: No tenderness.     Right lower leg: No tenderness. No edema.     Left lower leg: No edema.     Comments: Right lateral thigh pain elicited with adduction and inner thigh pain elicited with abduction.  Skin:    General: Skin is warm.     Findings: No erythema or rash.  Neurological:     General: No focal deficit present.     Mental Status: She is alert and oriented to person, place, and time.     Comments: Antalgic gait, not assisted.  Psychiatric:        Mood and Affect: Affect normal. Mood is anxious.   ASSESSMENT AND PLAN:  Ms. Keeler was seen today for leg pain.   Pain in both lower extremities -     predniSONE ; Take 3 tablets (60 mg total) by mouth daily for 3 days, THEN 2 tablets (40 mg total) daily for 3 days, THEN 1 tablet (20 mg total) daily for 3 days, THEN 0.5 tablets (10 mg total) daily for 3 days. Take all tablets together with breakfast.  Dispense: 20 tablet; Refill: 0 -     DG Lumbar Spine Complete; Future -     oxyCODONE -Acetaminophen ; Take 1 tablet by mouth every 8 (eight) hours as needed for up to 5 days for severe pain (pain score 7-10).  Dispense: 15 tablet; Refill: 0  We discussed possible etiologies, problem seems to be chronic but worse for the past month or so. ?  Lumbar radiculopathy. Peripheral pulses are palpable and no findings to suggest DVT. Pain around  major trochanteric bursa with palpation and adduction, so trochanteric bursitis also in the differential diagnosis. After discussion of some side effects, she agrees with prednisone  taper, instructed to take it with breakfast. In regard to pain management, due to history of CVA, I do not recommend NSAIDs.  She has taken Percocet in the past and it was well-tolerated , so sent rx to take tid prn. PDMP reviewed. Recommend avoiding Lorazepam  while taking opioid med, she does not take it frequently. She would like imaging done today. If pain does not resolve, she may need lumbar MRI.  F/U with PCP in  2 weeks, before if needed.  I spent a total of 33 minutes in both face to face and non face to face activities for this visit on the date of this encounter. During this time history was obtained and documented, examination was performed, prior labs reviewed, and assessment/plan discussed.  Return in about 2 weeks (around 02/15/2024), or Leg pain with PCP.  I, Fritz Jewel Wierda, acting as a scribe for Nikoletta Varma Sw[place]iland, MD., have documented all relevant documentation on the behalf of Amahd Morino [place], MD, as directed by  Hillis Mcphatter [place], MD while in the presence of Loudon Krakow [place], MD.   I, Casmere Hollenbeck [place], MD, have reviewed all documentation for this visit. The documentation on 02/01/24 for the exam, diagnosis, procedures, and orders are all accurate and complete.  Jennika Ringgold G. [place]and, MD  [facility]. [place] office.

## 2024-02-05 ENCOUNTER — Other Ambulatory Visit: Payer: Self-pay | Admitting: Cardiology

## 2024-02-05 ENCOUNTER — Ambulatory Visit: Payer: Self-pay | Admitting: Family Medicine

## 2024-02-12 ENCOUNTER — Other Ambulatory Visit: Payer: Self-pay

## 2024-02-12 MED ORDER — DILTIAZEM HCL ER COATED BEADS 180 MG PO CP24: 180.0000 mg | ORAL_CAPSULE | Freq: Every day | ORAL | 0 refills | Status: DC

## 2024-02-15 ENCOUNTER — Ambulatory Visit: Admitting: Family Medicine

## 2024-02-25 ENCOUNTER — Telehealth: Payer: Self-pay | Admitting: Cardiology

## 2024-02-25 MED ORDER — DILTIAZEM HCL ER COATED BEADS 180 MG PO CP24: 180.0000 mg | ORAL_CAPSULE | Freq: Every day | ORAL | 2 refills | Status: DC

## 2024-02-25 NOTE — Telephone Encounter (Signed)
*  STAT* If patient is at the pharmacy, call can be transferred to refill team.   1. Which medications need to be refilled? (please list name of each medication and dose if known) diltiazem  (CARDIZEM  CD) 180 MG 24 hr capsule    4. Which pharmacy/location (including street and city if local pharmacy) is medication to be sent to? [id number] - [place], [place] - [address]     5. Do they need a 30 day or 90 day supply?90

## 2024-02-25 NOTE — Telephone Encounter (Signed)
 Pt's medication was sent to pt's pharmacy as requested. Confirmation received.

## 2024-02-27 ENCOUNTER — Telehealth: Payer: Self-pay | Admitting: *Deleted

## 2024-02-27 NOTE — Telephone Encounter (Signed)
 Reason for CRM: Pt stated that she is having joint pain and would like for Dr.Fry to prescribe predniSONE  (DELTASONE ) 20 MG tablet until her appt on 7/7. Pt would like a callback with an update on the medication request.

## 2024-02-28 NOTE — Telephone Encounter (Signed)
 FYI Pt has appointment with Dr Johnny for this on 03/03/24

## 2024-03-03 ENCOUNTER — Ambulatory Visit: Admitting: Family Medicine

## 2024-03-03 ENCOUNTER — Encounter: Payer: Self-pay | Admitting: Family Medicine

## 2024-03-03 VITALS — BP 114/80 | HR 79 | Temp 98.1°F | Wt 189.8 lb

## 2024-03-03 DIAGNOSIS — M5416 Radiculopathy, lumbar region: Secondary | ICD-10-CM

## 2024-03-03 MED ORDER — MECLIZINE HCL 25 MG PO TABS: 25.0000 mg | ORAL_TABLET | Freq: Three times a day (TID) | ORAL | 5 refills | Status: AC | PRN

## 2024-03-03 MED ORDER — CELECOXIB 200 MG PO CAPS: 200.0000 mg | ORAL_CAPSULE | Freq: Two times a day (BID) | ORAL | 2 refills | Status: DC

## 2024-03-03 NOTE — Progress Notes (Signed)
   Subjective:    Patient ID: Angela Hahn, female    DOB: 1975/10/09, 48 y.o.   MRN: [id number]  HPI Here to follow up on low back pain and right leg pain. She has had the back pain for the past year, and the leg pain started about 2 months ago. She says Ibuprofen  does not help, but Tylenol  gives her some relief. She was seen by Dr. Swaziland on 6-65-25 for this, and she prescribed a Prednisone  taper beginning with 60 mg a day. This was very successful in relieving her pain, but the pain returned once this was finished. The right leg pain runs down the back of the right buttock and leg to the foot. Sometimes the right foot feels numb and tingles. No hx of trauma. 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    Review of Systems  Constitutional: Negative.   Respiratory: Negative.    Cardiovascular: Negative.   Musculoskeletal:  Positive for back pain.  Neurological:  Positive for numbness. Negative for weakness.       Objective:   Physical Exam Constitutional:      Comments: Walks with a limp   Cardiovascular:     Rate and Rhythm: Normal rate and regular rhythm.     Pulses: Normal pulses.     Heart sounds: Normal heart sounds.  Pulmonary:     Effort: Pulmonary effort is normal.     Breath sounds: Normal breath sounds.  Musculoskeletal:     Comments: She is tender over the right side of the lower back and the right sciatic notch. ROM of the spine is limited by pain. Negative SLR.   Neurological:     Mental Status: She is alert.           Assessment & Plan:  Low back pain with right sided radiculopathy. She will try Celebrex  200 mg BID, and she may add Tylenol  to this as needed. We will set up a lumbar MRI to evaluate further.  Garnette Olmsted, MD

## 2024-03-04 ENCOUNTER — Encounter: Payer: Self-pay | Admitting: Family Medicine

## 2024-03-13 ENCOUNTER — Telehealth: Payer: Self-pay

## 2024-03-13 ENCOUNTER — Other Ambulatory Visit: Payer: Self-pay | Admitting: Family Medicine

## 2024-03-13 NOTE — Telephone Encounter (Signed)
 I will not do a peer to peer. I can refer her to a specialist if she agrees (they can easily get the scan approved)

## 2024-03-13 NOTE — Telephone Encounter (Signed)
 Copied from CRM [id number]. Topic: General - Other >> Mar 11, 2024  8:29 AM Franky GRADE wrote: Reason for CRM:[facility] is calling to advise the MRI Lumbar WO scheduled for Saturday 03/15/2024 has been denied by the insurance, they would like to know if Dr.Fry would like to do a peer to peer to try and have the denial overturned. Best call back number [phone] ext [phone].

## 2024-03-14 NOTE — Telephone Encounter (Signed)
 I did the referral to Orthopedics. In addition to the Celebrex  she can take up to 3000 mg of Tylenol  daily

## 2024-03-14 NOTE — Addendum Note (Signed)
 Addended by: JOHNNY SENIOR A on: 03/14/2024 08:36 AM   Modules accepted: Orders

## 2024-03-15 ENCOUNTER — Other Ambulatory Visit

## 2024-03-19 ENCOUNTER — Telehealth: Payer: Self-pay | Admitting: Family Medicine

## 2024-03-19 NOTE — Telephone Encounter (Signed)
 Copied from CRM [id number]. Topic: General - Call Back - No Documentation >> Mar 19, 2024 12:43 PM Rea C wrote: Reason for CRM: Patient need a Drs note to be out of work and to return on Monday.   Patient said she already discussed it with Dr. Johnny and is taking the medication as advised and think she just needs a Dr.s note to take off work until Monday,   [phone] (M)   Patient's pain is severe but she declined speaking with Nurse Triage because she said Dr. Johnny is aware of what is going on, she just needs to get started with the pain management and medications.

## 2024-03-24 ENCOUNTER — Other Ambulatory Visit (HOSPITAL_BASED_OUTPATIENT_CLINIC_OR_DEPARTMENT_OTHER): Payer: Self-pay

## 2024-03-24 MED ORDER — OXYCODONE-ACETAMINOPHEN 5-325 MG PO TABS
1.0000 | ORAL_TABLET | Freq: Four times a day (QID) | ORAL | 0 refills | Status: DC | PRN
  Filled 2024-03-24: qty 20, 5d supply, fill #0

## 2024-03-24 NOTE — Telephone Encounter (Signed)
Please get her this work note

## 2024-03-24 NOTE — Telephone Encounter (Signed)
 I sent in some Percocet for her to use

## 2024-03-24 NOTE — Telephone Encounter (Signed)
 Return to work note provided for Thursday 03/27/24 since pt states she is still not better and she has  MRI appointment on that day. Pt states that she needs a stronger pain medication since the Tylenol  and Celebrex  are not helping. Please advise

## 2024-03-25 NOTE — Telephone Encounter (Signed)
 Pt.notified

## 2024-03-27 ENCOUNTER — Other Ambulatory Visit (HOSPITAL_BASED_OUTPATIENT_CLINIC_OR_DEPARTMENT_OTHER): Payer: Self-pay

## 2024-03-27 ENCOUNTER — Telehealth: Payer: Self-pay | Admitting: *Deleted

## 2024-03-27 ENCOUNTER — Encounter: Payer: Self-pay | Admitting: Physician Assistant

## 2024-03-27 ENCOUNTER — Ambulatory Visit (INDEPENDENT_AMBULATORY_CARE_PROVIDER_SITE_OTHER): Admitting: Physician Assistant

## 2024-03-27 ENCOUNTER — Other Ambulatory Visit: Payer: Self-pay

## 2024-03-27 DIAGNOSIS — M5416 Radiculopathy, lumbar region: Secondary | ICD-10-CM | POA: Diagnosis not present

## 2024-03-27 NOTE — Progress Notes (Signed)
 Office Visit Note   Patient: Angela Hahn           Date of Birth: 01-17-1976           MRN: [id number] Visit Date: 03/27/2024              Requested by: Angela Garnette LABOR, MD [address] [place],  [place] [address] PCP: Angela Garnette LABOR, MD   Assessment & Plan: Visit Diagnoses:  1. Radiculopathy, lumbar region     Plan:  We will send her for formal physical therapy on [facility] for core strengthening, back exercises, stretching, modalities and a home exercise program.  Will see her back in 6 weeks to see how she is doing overall.  If her condition worsens in any way she will return sooner.  Questions were encouraged and answered.  Follow-Up Instructions: No follow-ups on file.   Orders:  No orders of the defined types were placed in this encounter.  No orders of the defined types were placed in this encounter.     Procedures: No procedures performed   Clinical Data: No additional findings.   Subjective: Chief Complaint  Patient presents with   Lower Back - Pain    HPI Angela Hahn is a 48 year old female who is seen today for the first time.  She has had ongoing radicular symptoms down her right leg for several months.  She was initially seen by primary care back in June and was given a prednisone  Dosepak which helped with the radicular symptoms down the leg until she stopped taking it.  She states the radicular symptoms down the right leg go from the buttock as a down to just below the anterior ankle.  She is having no real back pain.  Denies any saddle anesthesia symptoms, bowel or bladder dysfunction, fevers chills, weight change.  She is currently taking Tylenol  and Celebrex  without any real relief.  Ranks her radicular symptoms down the right leg to be 10 out of 10 pain at worst and this usually occurs when standing or sitting.  Symptoms are better when moving.  She has had no therapy or chiropractic treatment.  She does sleep with a pillow under her legs or to  prop her legs up which helps with the radicular symptoms. Lumbar films dated 02/01/2024 are reviewed.  These showed no acute fractures no acute findings.  Grade 1 spondylolisthesis L4 on 5.  Facet arthritic changes L4-5 L5-S1.  Sacroiliac arthritic changes noted.  Review of Systems See HPI  Objective: Vital Signs: LMP 04/26/2022 Comment: Urine preg negative 05/09/22  Physical Exam Constitutional:      Appearance: She is not ill-appearing or diaphoretic.  Cardiovascular:     Pulses: Normal pulses.  Pulmonary:     Effort: Pulmonary effort is normal.  Neurological:     Mental Status: She is alert and oriented to person, place, and time.     Ortho Exam Bilateral hips: Good range of motion both hips without pain.  Lower extremities 5 out of 5 strength throughout against resistance.  Negative straight leg raise on the left positive straight leg raise on the right.  Bilateral feet dorsal pedal pulses are 2+ and equal symmetric.  Sensation grossly intact bilateral feet to light touch. Lumbar spine: Full forward flexion full extension without pain.  Nontender along the lumbar spine paraspinous region.  Nontender over the SI joints bilaterally.  Specialty Comments:  No specialty comments available.  Imaging: No results found.   PMFS History: Patient  Active Problem List   Diagnosis Date Noted   Eczema 08/08/2023   Postoperative anemia 05/12/2022   Abnormal uterine bleeding (AUB) 05/09/2022   Status post hysterectomy 05/09/2022   Fibroid 03/18/2022   Dysmenorrhea 03/07/2022   Iron  deficiency anemia 01/26/2022   HTN (hypertension) 09/05/2021   Anxiety disorder 07/07/2021   Mixed hyperlipidemia 07/04/2021   CVA (cerebral vascular accident) (HCC) 06/23/2021   Menorrhagia with regular cycle 10/27/2020   Pain of right thumb 06/04/2018   Chronic low back pain 06/18/2012   Past Medical History:  Diagnosis Date   Anemia    Anxiety    Arthritis    7-8 years ago patient was told she had  degenerative arthritis in her back   Chronic headaches    Concussion with loss of consciousness 09/24/2015   COVID-19 virus infection 03/08/2021   Dysmenorrhea    Heart murmur    dx'd since childhood   Sleep apnea    Stroke [facility])     Family History  Problem Relation Age of Onset   Arthritis Mother    Depression Mother    Hypertension Mother    Diabetes Mother    Hypertension Father    Diabetes Father     Past Surgical History:  Procedure Laterality Date   BUBBLE STUDY  07/14/2021   Procedure: BUBBLE STUDY;  Surgeon: Raford Riggs, MD;  Location: [facility] ENDOSCOPY;  Service: Cardiovascular;;   HYSTERECTOMY ABDOMINAL WITH SALPINGECTOMY Bilateral 05/09/2022   Procedure: HYSTERECTOMY ABDOMINAL WITH BILATERAL SALPINGECTOMY;  Surgeon: Izell Harari, MD;  Location: [facility] OR;  Service: Gynecology;  Laterality: Bilateral;   LAPAROSCOPY N/A 05/09/2022   Procedure: LAPAROSCOPY DIAGNOSTIC;  Surgeon: Izell Harari, MD;  Location[facility] OR;  Service: Gynecology;  Laterality: N/A;   TEE WITHOUT CARDIOVERSION N/A 07/14/2021   Procedure: TRANSESOPHAGEAL ECHOCARDIOGRAM (TEE);  Surgeon: Raford Riggs, MD;  Location: [facility] ENDOSCOPY;  Service: Cardiovascular;  Laterality: N/A;   TUBAL LIGATION     Social History   Occupational History   Not on file  Tobacco Use   Smoking status: Former    Current packs/day: 0.00    Types: Cigarettes    Quit date: 2020    Years since quitting: 5.5   Smokeless tobacco: Never  Vaping Use   Vaping status: Some Days  Substance and Sexual Activity   Alcohol use: No    Alcohol/week: 0.0 standard drinks of alcohol   Drug use: No   Sexual activity: Yes    Partners: Male    Birth control/protection: Surgical    Comment: Tubal

## 2024-03-27 NOTE — Telephone Encounter (Signed)
 Copied from CRM [id number]. Topic: General - Other >> Mar 27, 2024  4:28 PM Chiquita SQUIBB wrote: Reason for CRM: Patient called in, when asked how I can help her today she asked if I was in the office, when I answered no that I am an extension of the office. The patient stated she was already working with the office and would only like to speak to the office. Called [facility] and recognized the patient was talking to [place] earlier today, but Inocente had already left for the day. Please contact the patient back.

## 2024-03-28 NOTE — Telephone Encounter (Signed)
 Spoke with pt this morning advised to pick up the work letter at the office, pt letter placed at [employer] for pt

## 2024-04-14 ENCOUNTER — Ambulatory Visit: Payer: Self-pay

## 2024-04-14 NOTE — Telephone Encounter (Signed)
 FYI Only or Action Required?: FYI only for provider.  Patient was last seen in primary care on 03/03/2024 by Johnny Garnette LABOR, MD.  Called Nurse Triage reporting Shoulder Pain.  Symptoms began about a month ago.  Interventions attempted: OTC medications: Tylenol  and Prescription medications: Percocet, celebrex .  Symptoms are: severe left shoulder pain (limited ROM due to pain) radiates down left arm, neck pain, right hand (2 fingers) pins and needles, chest pain (lasted 1-2 minutes) 4-5 days ago and resolved, heart palpitations when pain worsens gradually worsening.  Triage Disposition: See HCP Within 4 Hours (Or PCP Triage)  Patient/caregiver understands and will follow disposition?: Yes                    Copied from CRM #[id number]. Topic: Clinical - Red Word Triage >> Apr 14, 2024 11:12 AM Gennette ORN wrote: Red Word that prompted transfer to Nurse Triage: Patient is calling she is having severe shoulder pain is traveling to her arms. She is requesting an appointment. Reason for Disposition  [1] SEVERE pain (e.g., excruciating) AND [2] not improved 2 hours after pain medicine/ice packs  Answer Assessment - Initial Assessment Questions 1. ONSET: When did the pain start?     About a month, worsening for 2 weeks.  2. LOCATION: Where is the pain located?     Left shoulder, radiates down left arm. She states top part of shoulder pain radiates through the joint. Feels like its on fire if I lift it all the way up. She states she can not fully raise her arm all the way up.  3. PAIN: How bad is the pain? (Scale 1-10; or mild, moderate, severe)     Constant, states it will calm down but does not fully go away. 20/10  4. WORK OR EXERCISE: Has there been any recent work or exercise that involved this part of the body?     She states this started when she was lifting some heavy items.   5. CAUSE: What do you think is causing the shoulder pain?     She states she was  told she has arthritis but she feels like this pain is worse than arthritis. She states she thinks it could be her rotator cuff. She thought it started as a strain.  6. OTHER SYMPTOMS: Do you have any other symptoms? (e.g., neck pain, swelling, rash, fever, numbness, weakness)     Neck pain, heart starts fluttering when the pain gets bad palpitations, tingling/pins and needles in right hand (2 fingers) (intermittent) x last week. She states she was having chest pain 4-5 days ago lasted 1-2 minutes. Denies chest pain today, jaw pain, SOB, nausea, vomiting, fever, numbness or weakness anywhere else in body.  7. PREGNANCY: Is there any chance you are pregnant? When was your last menstrual period?     LMP: none.  Protocols used: Shoulder Pain-A-AH, Shoulder Injury-A-AH

## 2024-04-14 NOTE — Telephone Encounter (Signed)
 Noted

## 2024-04-14 NOTE — Therapy (Deleted)
 OUTPATIENT PHYSICAL THERAPY THORACOLUMBAR EVALUATION   Patient Name: Angela Hahn MRN: [id number] DOB:September 22, 1975, 48 y.o., female Today's Date: 04/14/2024  END OF SESSION:   Past Medical History:  Diagnosis Date   Anemia    Anxiety    Arthritis    7-8 years ago patient was told she had degenerative arthritis in her back   Chronic headaches    Concussion with loss of consciousness 09/24/2015   COVID-19 virus infection 03/08/2021   Dysmenorrhea    Heart murmur    dx'd since childhood   Sleep apnea    Stroke [facility])    Past Surgical History:  Procedure Laterality Date   BUBBLE STUDY  07/14/2021   Procedure: BUBBLE STUDY;  Surgeon: Raford Riggs, MD;  Location: [facility] ENDOSCOPY;  Service: Cardiovascular;;   HYSTERECTOMY ABDOMINAL WITH SALPINGECTOMY Bilateral 05/09/2022   Procedure: HYSTERECTOMY ABDOMINAL WITH BILATERAL SALPINGECTOMY;  Surgeon: Izell Harari, MD;  Location: [facility] OR;  Service: Gynecology;  Laterality: Bilateral;   LAPAROSCOPY N/A 05/09/2022   Procedure: LAPAROSCOPY DIAGNOSTIC;  Surgeon: Izell Harari, MD;  Location: [facility] OR;  Service: Gynecology;  Laterality: N/A;   TEE WITHOUT CARDIOVERSION N/A 07/14/2021   Procedure: TRANSESOPHAGEAL ECHOCARDIOGRAM (TEE);  Surgeon: Raford Riggs, MD;  Location: [facility] ENDOSCOPY;  Service: Cardiovascular;  Laterality: N/A;   TUBAL LIGATION     Patient Active Problem List   Diagnosis Date Noted   Eczema 08/08/2023   Postoperative anemia 05/12/2022   Abnormal uterine bleeding (AUB) 05/09/2022   Status post hysterectomy 05/09/2022   Fibroid 03/18/2022   Dysmenorrhea 03/07/2022   Iron  deficiency anemia 01/26/2022   HTN (hypertension) 09/05/2021   Anxiety disorder 07/07/2021   Mixed hyperlipidemia 07/04/2021   CVA (cerebral vascular accident) (HCC) 06/23/2021   Menorrhagia with regular cycle 10/27/2020   Pain of right thumb 06/04/2018   Chronic low back pain 06/18/2012    PCP: Johnny Garnette LABOR, MD   REFERRING PROVIDER:  Vernetta Lonni GRADE, MD  REFERRING DIAG: M54.16 (ICD-10-CM) - Radiculopathy, lumbar region  Rationale for Evaluation and Treatment: Rehabilitation  THERAPY DIAG:  No diagnosis found.  ONSET DATE: 3 months  SUBJECTIVE:                                                                                                                                                                                           SUBJECTIVE STATEMENT: ***  PERTINENT HISTORY:  Here to follow up on low back pain and right leg pain. She has had the back pain for the past year, and the leg pain started about 2 months ago. She says Ibuprofen  does not help, but Tylenol  gives her some relief. She  was seen by Dr. [place] on 6-65-25 for this, and she prescribed a Prednisone  taper beginning with 60 mg a day. This was very successful in relieving her pain, but the pain returned once this was finished. The right leg pain runs down the back of the right buttock and leg to the foot. Sometimes the right foot feels numb and tingles. No hx of trauma. 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   PAIN:  Are you having pain? Yes: NPRS scale: *** Pain location: *** Pain description: *** Aggravating factors: *** Relieving factors: ***  PRECAUTIONS: None  RED FLAGS: None   WEIGHT BEARING RESTRICTIONS: No  FALLS:  Has patient fallen in last 6 months? No  OCCUPATION: ***  PLOF: Independent  PATIENT GOALS: To manage my back pain  NEXT MD VISIT: TBD  OBJECTIVE:  Note: Objective measures were completed at Evaluation unless otherwise noted.  DIAGNOSTIC FINDINGS:  IMPRESSION: 1. 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 Degenerative anterolisthesis at L4-5 of 3-4 mm, worsened since 2021. 2. Sacroiliac osteoarthritis, right worse than left.     Electronically Signed   By: Oneil Officer M.D.   On: 02/05/2024  14:31  PATIENT SURVEYS:  Modified Oswestry:   Interpretation of scores: Score Category Description  0-20% Minimal Disability The patient can cope with most living activities. Usually no treatment is indicated apart from advice on lifting, sitting and exercise  21-40% Moderate Disability The patient experiences more pain and difficulty with sitting, lifting and standing. Travel and social life are more difficult and they may be disabled from work. Personal care, sexual activity and sleeping are not grossly affected, and the patient can usually be managed by conservative means  41-60% Severe Disability Pain remains the main problem in this group, but activities of daily living are affected. These patients require a detailed investigation  61-80% Crippled Back pain impinges on all aspects of the patient's life. Positive intervention is required  81-100% Bed-bound  These patients are either bed-bound or exaggerating their symptoms  Bluford FORBES Zoe DELENA Karon DELENA, et al. Surgery versus conservative management of stable thoracolumbar fracture: the PRESTO feasibility RCT. [place]): [employer]; 2021 Nov. [facility] Technology Assessment, No. 25.62.) Appendix 3, Oswestry Disability Index category descriptors. Available from: FindJewel[employer]cz  Minimally Clinically Important Difference (MCID) = 12.8%  MUSCLE LENGTH: Hamstrings: Right *** deg; Left *** deg Debby test: Right *** deg; Left *** deg  POSTURE: {posture:25561}  PALPATION: ***  LUMBAR ROM:   AROM eval  Flexion   Extension   Right lateral flexion   Left lateral flexion   Right rotation   Left rotation    (Blank rows = not tested)  LOWER EXTREMITY ROM:     {AROM/PROM:27142}  Right eval Left eval  Hip flexion    Hip extension    Hip abduction    Hip adduction    Hip internal rotation    Hip external rotation    Knee flexion    Knee extension    Ankle dorsiflexion    Ankle  plantarflexion    Ankle inversion    Ankle eversion     (Blank rows = not tested)  LOWER EXTREMITY MMT:    MMT Right eval Left eval  Hip flexion    Hip extension    Hip abduction    Hip adduction    Hip internal rotation    Hip  external rotation    Knee flexion    Knee extension    Ankle dorsiflexion    Ankle plantarflexion    Ankle inversion    Ankle eversion     (Blank rows = not tested)  LUMBAR SPECIAL TESTS:  Straight leg raise test: {pos/neg:25243} and Slump test: {pos/neg:25243}  FUNCTIONAL TESTS:  30 seconds chair stand test  GAIT: Distance walked: 76ft x2 Assistive device utilized: {Assistive devices:23999} Level of assistance: {Levels of assistance:24026} Comments: ***  TREATMENT:                                                                                                                         [facility] Adult PT Treatment:                                                DATE: 04/15/24 Eval and HEP Self Care: Additional minutes spent for educating on updated Therapeutic Home Exercise Program as well as comparing current status to condition at start of symptoms. This included exercises focusing on stretching, strengthening, with focus on eccentric aspects. Long term goals include an improvement in range of motion, strength, endurance as well as avoiding reinjury. Patient's frequency would include in 1-2 times a day, 3-5 times a week for a duration of 6-12 weeks. Proper technique shown and discussed handout in great detail. All questions were discussed and addressed.      PATIENT EDUCATION:  Education details: Discussed eval findings, rehab rationale and POC and patient is in agreement  Person educated: Patient Education method: Explanation and Handouts Education comprehension: verbalized understanding and needs further education  HOME EXERCISE PROGRAM: ***  ASSESSMENT:  CLINICAL IMPRESSION: Patient is a 48 y.o. female who was seen today for physical therapy  evaluation and treatment for ***.   OBJECTIVE IMPAIRMENTS: {opptimpairments:25111}.   ACTIVITY LIMITATIONS: {activitylimitations:27494}  PARTICIPATION LIMITATIONS: {participationrestrictions:25113}  PERSONAL FACTORS: {Personal factors:25162} are also affecting patient's functional outcome.   REHAB POTENTIAL: Good  CLINICAL DECISION MAKING: Stable/uncomplicated  EVALUATION COMPLEXITY: Moderate   GOALS: Goals reviewed with patient? No  SHORT TERM GOALS: Target date: ***  Patient to demonstrate independence in HEP  Baseline: Goal status: INITIAL  2.  *** Baseline:  Goal status: INITIAL  3.  *** Baseline:  Goal status: INITIAL  4.  *** Baseline:  Goal status: INITIAL  5.  *** Baseline:  Goal status: INITIAL  6.  *** Baseline:  Goal status: INITIAL  LONG TERM GOALS: Target date: ***  Patient will acknowledge ***/10 pain at least once during episode of care  Baseline:  Goal status: INITIAL  2.  Patient will score at least ***% on FOTO to signify clinically meaningful improvement in functional abilities.   Baseline:  Goal status: INITIAL  3.  *** Baseline:  Goal status: INITIAL  4.  Patient will increase 30s chair stand reps from ***  to *** with/without arms to demonstrate and improved functional ability with less pain/difficulty as well as reduce fall risk.  Baseline:  Goal status: INITIAL  5.  *** Baseline:  Goal status: INITIAL  6.  *** Baseline:  Goal status: INITIAL  PLAN:  PT FREQUENCY: 1-2x/week  PT DURATION: 6 weeks  PLANNED INTERVENTIONS: 97110-Therapeutic exercises, 97530- Therapeutic activity, [id number]- Neuromuscular re-education, 97535- Self Care, and 02859- Manual therapy.  PLAN FOR NEXT SESSION: HEP review and update, manual techniques as appropriate, aerobic tasks, ROM and flexibility activities, strengthening and PREs, TPDN, gait and balance training as needed     Reyes CHRISTELLA Kohut, PT 04/14/2024, 12:11 PM

## 2024-04-15 ENCOUNTER — Ambulatory Visit: Attending: Orthopaedic Surgery

## 2024-05-29 ENCOUNTER — Encounter: Payer: Self-pay | Admitting: Cardiology

## 2024-05-29 ENCOUNTER — Ambulatory Visit: Attending: Cardiology | Admitting: Cardiology

## 2024-05-29 ENCOUNTER — Ambulatory Visit

## 2024-05-29 ENCOUNTER — Other Ambulatory Visit (HOSPITAL_COMMUNITY): Payer: Self-pay

## 2024-05-29 VITALS — BP 148/92 | HR 67 | Ht 66.0 in | Wt 202.2 lb

## 2024-05-29 DIAGNOSIS — R002 Palpitations: Secondary | ICD-10-CM | POA: Insufficient documentation

## 2024-05-29 DIAGNOSIS — E782 Mixed hyperlipidemia: Secondary | ICD-10-CM | POA: Insufficient documentation

## 2024-05-29 DIAGNOSIS — I10 Essential (primary) hypertension: Secondary | ICD-10-CM | POA: Diagnosis present

## 2024-05-29 DIAGNOSIS — E559 Vitamin D deficiency, unspecified: Secondary | ICD-10-CM | POA: Insufficient documentation

## 2024-05-29 DIAGNOSIS — Z8673 Personal history of transient ischemic attack (TIA), and cerebral infarction without residual deficits: Secondary | ICD-10-CM | POA: Diagnosis present

## 2024-05-29 DIAGNOSIS — Z780 Asymptomatic menopausal state: Secondary | ICD-10-CM | POA: Diagnosis present

## 2024-05-29 DIAGNOSIS — R7303 Prediabetes: Secondary | ICD-10-CM | POA: Insufficient documentation

## 2024-05-29 LAB — LIPID PANEL

## 2024-05-29 MED ORDER — DILTIAZEM HCL ER COATED BEADS 240 MG PO CP24
240.0000 mg | ORAL_CAPSULE | Freq: Every day | ORAL | 3 refills | Status: DC
Start: 2024-05-29 — End: 2024-05-29

## 2024-05-29 MED ORDER — BLOOD PRESSURE MONITOR AUTOMAT DEVI
1.0000 [IU] | Freq: Once | 0 refills | Status: AC
  Filled 2024-05-29: qty 1, 30d supply, fill #0

## 2024-05-29 MED ORDER — DILTIAZEM HCL ER COATED BEADS 240 MG PO CP24
240.0000 mg | ORAL_CAPSULE | Freq: Every day | ORAL | 3 refills | Status: AC
  Filled 2024-05-29: qty 30, 30d supply, fill #0
  Filled 2024-07-07: qty 30, 30d supply, fill #1
  Filled 2024-08-11: qty 30, 30d supply, fill #2

## 2024-05-29 NOTE — Progress Notes (Signed)
 Cardiology Office Note:    Date:  05/29/2024   ID:  Angela Hahn, DOB [id number], MRN [id number]  PCP:  Johnny Garnette LABOR, MD  Cardiologist:  Dub Huntsman, DO  Electrophysiologist:  None   Referring MD: Johnny Garnette LABOR, MD    I am doing well  History of Present Illness:    Angela Hahn is a 48 y.o. female with a hx of  hypertension and a history of stroke who presents with palpitations and blood pressure management.  She experiences episodes of palpitations with a racing or fluttering heart, occurring occasionally. In 2022, she was placed on a heart monitor and started on metoprolol , which she did not tolerate. She is currently on Cardizem  180 mg.  Hypertension is managed with medication from her primary care provider, but she has not been monitoring her blood pressure at home.  She has a family history of high cholesterol, which raises concerns about potential cardiovascular risks.   Past Medical History:  Diagnosis Date   Anemia    Anxiety    Arthritis    7-8 years ago patient was told she had degenerative arthritis in her back   Chronic headaches    Concussion with loss of consciousness 09/24/2015   COVID-19 virus infection 03/08/2021   Dysmenorrhea    Heart murmur    dx'd since childhood   Sleep apnea    Stroke [facility])     Past Surgical History:  Procedure Laterality Date   BUBBLE STUDY  07/14/2021   Procedure: BUBBLE STUDY;  Surgeon: Raford Riggs, MD;  Location: [facility] ENDOSCOPY;  Service: Cardiovascular;;   HYSTERECTOMY ABDOMINAL WITH SALPINGECTOMY Bilateral 05/09/2022   Procedure: HYSTERECTOMY ABDOMINAL WITH BILATERAL SALPINGECTOMY;  Surgeon: Izell Harari, MD;  Location: [facility] OR;  Service: Gynecology;  Laterality: Bilateral;   LAPAROSCOPY N/A 05/09/2022   Procedure: LAPAROSCOPY DIAGNOSTIC;  Surgeon: Izell Harari, MD;  Location: [facility] OR;  Service: Gynecology;  Laterality: N/A;   TEE WITHOUT CARDIOVERSION N/A 07/14/2021   Procedure: TRANSESOPHAGEAL ECHOCARDIOGRAM  (TEE);  Surgeon: Raford Riggs, MD;  Location: [facility] ENDOSCOPY;  Service: Cardiovascular;  Laterality: N/A;   TUBAL LIGATION      Current Medications: Current Meds  Medication Sig   acetaminophen  (TYLENOL ) 325 MG tablet Take 2 tablets (650 mg total) by mouth every 6 (six) hours as needed.   ASPIRIN  LOW DOSE 81 MG tablet TAKE 1 TABLET(81 MG) BY MOUTH DAILY. SWALLOW WHOLE   atorvastatin  (LIPITOR) 40 MG tablet TAKE 1 TABLET(40 MG) BY MOUTH DAILY   Blood Pressure Monitoring (BLOOD PRESSURE MONITOR AUTOMAT) DEVI Use to monitor blood pressure.   celecoxib  (CELEBREX ) 200 MG capsule Take 1 capsule (200 mg total) by mouth 2 (two) times daily.   LORazepam  (ATIVAN ) 1 MG tablet TAKE 1 TABLET BY MOUTH EVERY 8 HOURS AS NEEDED FOR ANXIETY   meclizine  (ANTIVERT ) 25 MG tablet Take 1 tablet (25 mg total) by mouth 3 (three) times daily as needed for dizziness.   oxyCODONE -acetaminophen  (PERCOCET/ROXICET) 5-325 MG tablet Take 1 tablet by mouth every 6 (six) hours as needed for severe pain (pain score 7-10).   simethicone  (MYLICON) 80 MG chewable tablet Chew 1 tablet (80 mg total) by mouth 2 (two) times daily after a meal for 14 days.   triamcinolone  cream (KENALOG ) 0.1 % Apply 1 Application topically 2 (two) times daily as needed (eczema).   [DISCONTINUED] diltiazem  (CARDIZEM  CD) 180 MG 24 hr capsule Take 1 capsule (180 mg total) by mouth daily.   [DISCONTINUED] diltiazem  (CARDIZEM  CD) 240 MG  24 hr capsule Take 1 capsule (240 mg total) by mouth daily.     Allergies:   Gadolinium derivatives, Hydrocodone , and Tramadol    Social History   Socioeconomic History   Marital status: Single    Spouse name: Not on file   Number of children: 3   Years of education: Not on file   Highest education level: Some college, no degree  Occupational History   Not on file  Tobacco Use   Smoking status: Former    Current packs/day: 0.00    Types: Cigarettes    Quit date: 2020    Years since quitting: 5.7   Smokeless  tobacco: Never  Vaping Use   Vaping status: Some Days  Substance and Sexual Activity   Alcohol use: No    Alcohol/week: 0.0 standard drinks of alcohol   Drug use: No   Sexual activity: Yes    Partners: Male    Birth control/protection: Surgical    Comment: Tubal  Other Topics Concern   Not on file  Social History Narrative   Caffeine - might have 1 c coffee   Social Drivers of Corporate investment banker Strain: Not on file  Food Insecurity: No Food Insecurity (05/18/2022)   Hunger Vital Sign    Worried About Running Out of Food in the Last Year: Never true    Ran Out of Food in the Last Year: Never true  Transportation Needs: No Transportation Needs (05/18/2022)   PRAPARE - Administrator, Civil Service (Medical): No    Lack of Transportation (Non-Medical): No  Physical Activity: Not on file  Stress: Not on file  Social Connections: Not on file     Family History: The patient's family history includes Arthritis in her mother; Depression in her mother; Diabetes in her father and mother; Hypertension in her father and mother.  ROS:   Review of Systems  Constitution: Negative for decreased appetite, fever and weight gain.  HENT: Negative for congestion, ear discharge, hoarse voice and sore throat.   Eyes: Negative for discharge, redness, vision loss in right eye and visual halos.  Cardiovascular: Negative for chest pain, dyspnea on exertion, leg swelling, orthopnea and palpitations.  Respiratory: Negative for cough, hemoptysis, shortness of breath and snoring.   Endocrine: Negative for heat intolerance and polyphagia.  Hematologic/Lymphatic: Negative for bleeding problem. Does not bruise/bleed easily.  Skin: Negative for flushing, nail changes, rash and suspicious lesions.  Musculoskeletal: Negative for arthritis, joint pain, muscle cramps, myalgias, neck pain and stiffness.  Gastrointestinal: Negative for abdominal pain, bowel incontinence, diarrhea and excessive  appetite.  Genitourinary: Negative for decreased libido, genital sores and incomplete emptying.  Neurological: Negative for brief paralysis, focal weakness, headaches and loss of balance.  Psychiatric/Behavioral: Negative for altered mental status, depression and suicidal ideas.  Allergic/Immunologic: Negative for HIV exposure and persistent infections.    EKGs/Labs/Other Studies Reviewed:    The following studies were reviewed today:   EKG:  The ekg ordered today demonstrates   Recent Labs: 01/17/2024: BUN 12; Creatinine, Ser 0.70; Hemoglobin 13.3; Platelets 235; Potassium 3.5; Sodium 142  Recent Lipid Panel    Component Value Date/Time   CHOL 163 07/27/2021 1212   TRIG 68.0 07/27/2021 1212   HDL 71.80 07/27/2021 1212   CHOLHDL 2 07/27/2021 1212   VLDL 13.6 07/27/2021 1212   LDLCALC 78 07/27/2021 1212    Physical Exam:    VS:  BP (!) 148/92   Pulse 67   Ht 5' 6 (1.676  m)   Wt 202 lb 3.2 oz (91.7 kg)   LMP 04/26/2022 Comment: Urine preg negative 05/09/22  SpO2 99%   BMI 32.64 kg/m     Wt Readings from Last 3 Encounters:  05/29/24 202 lb 3.2 oz (91.7 kg)  03/03/24 189 lb 12.8 oz (86.1 kg)  02/01/24 190 lb 8 oz (86.4 kg)     GEN: Well nourished, well developed in no acute distress HEENT: Normal NECK: No JVD; No carotid bruits LYMPHATICS: No lymphadenopathy CARDIAC: S1S2 noted,RRR, no murmurs, rubs, gallops RESPIRATORY:  Clear to auscultation without rales, wheezing or rhonchi  ABDOMEN: Soft, non-tender, non-distended, +bowel sounds, no guarding. EXTREMITIES: No edema, No cyanosis, no clubbing MUSCULOSKELETAL:  No deformity  SKIN: Warm and dry NEUROLOGIC:  Alert and oriented x 3, non-focal PSYCHIATRIC:  Normal affect, good insight  ASSESSMENT:    1. Mixed hyperlipidemia   2. Vitamin D  deficiency   3. Prediabetes   4. Palpitations   5. History of CVA (cerebrovascular accident)   6. Primary hypertension   7. Menopause    PLAN:    Hypertension -  Blood  pressure not at target, worsened by menopause. Current Cardizem  180 mg. Consider second antihypertensive for control and kidney protection due to prediabetes. - Increase Cardizem  to 240 mg. - Prescribe blood pressure cuff for home monitoring. - Instruct to send blood pressure readings via MyChart in two weeks. - Plan to add olmesartan 20 mg for additional control and kidney protection.  Palpitations - Intermittent heart racing and fluttering. Intolerant to metoprolol . Cardizem  increase planned to address symptoms. - Increase Cardizem  to 240 mg. - Send heart monitor post-beach trip to evaluate palpitations and rule out atrial fibrillation.  Stroke - Stroke requires aggressive cardiovascular risk management. Hormone therapy not recommended at this post-hysterectomy due to stroke risk. - Avoid hormone therapy.  Menopause after hysterectomy with bilateral oophorectomy - Menopausal symptoms likely due to surgical menopause. Hormone therapy contraindicated due to stroke risk. - Manage symptoms conservatively.  Prediabetes - Previous hemoglobin A1c 6.3% indicates prediabetes. Monitoring essential to prevent diabetes progression. - Order hemoglobin A1c to assess glycemic control. - Consider olmesartan 20 mg for kidney protection if prediabetes persists.  Hyperlipidemia -  Last LDL 78 mg/dL in 7977. Monitoring necessary for lipid control. - Order lipid panel to assess current lipid levels.  Vitamin D  deficiency - History of Vitamin D  deficiency. Monitoring necessary for adequate levels. - Order vitamin D  level to assess current status.  The patient is in agreement with the above plan. The patient left the office in stable condition.  The patient will follow up in   Medication Adjustments/Labs and Tests Ordered: Current medicines are reviewed at length with the patient today.  Concerns regarding medicines are outlined above.  Orders Placed This Encounter  Procedures   Lipid panel   Lipoprotein  A (LPA)   VITAMIN D  25 Hydroxy (Vit-D Deficiency, Fractures)   Hemoglobin A1c   LONG TERM MONITOR (3-14 DAYS)   Meds ordered this encounter  Medications   DISCONTD: diltiazem  (CARDIZEM  CD) 240 MG 24 hr capsule    Sig: Take 1 capsule (240 mg total) by mouth daily.    Dispense:  90 capsule    Refill:  3   Blood Pressure Monitoring (BLOOD PRESSURE MONITOR AUTOMAT) DEVI    Sig: Use to monitor blood pressure.    Dispense:  1 each    Refill:  0   diltiazem  (CARDIZEM  CD) 240 MG 24 hr capsule    Sig: Take  1 capsule (240 mg total) by mouth daily.    Dispense:  90 capsule    Refill:  3    Patient Instructions  Medication Instructions:  Your physician has recommended you make the following change in your medication:  START: Cardizem  240 mg once daily  *If you need a refill on your cardiac medications before your next appointment, please call your pharmacy*  Lab Work: Lipids, Lp(a), HgbA1c, Vit D If you have labs (blood work) drawn today and your tests are completely normal, you will receive your results only by: MyChart Message (if you have MyChart) OR A paper copy in the mail If you have any lab test that is abnormal or we need to change your treatment, we will call you to review the results.  Testing/Procedures: GEOFFRY HEWS- Long Term Monitor Instructions  Your physician has requested you wear a ZIO patch monitor for 14 days.  This is a single patch monitor. [employer] supplies one patch monitor per enrollment. Additional stickers are not available. Please do not apply patch if you will be having a Nuclear Stress Test,  Echocardiogram, Cardiac CT, MRI, or Chest Xray during the period you would be wearing the  monitor. The patch cannot be worn during these tests. You cannot remove and re-apply the  ZIO XT patch monitor.  Your ZIO patch monitor will be mailed 3 day USPS to your address on file. It may take 3-5 days  to receive your monitor after you have been enrolled.  Once you have  received your monitor, please review the enclosed instructions. Your monitor  has already been registered assigning a specific monitor serial # to you.  Billing and Patient Assistance Program Information  We have supplied [employer] with any of your insurance information on file for billing purposes. [employer] offers a sliding scale Patient Assistance Program for patients that do not have  insurance, or whose insurance does not completely cover the cost of the ZIO monitor.  You must apply for the Patient Assistance Program to qualify for this discounted rate.  To apply, please call [employer] at [phone], select option 4, select option 2, ask to apply for  Patient Assistance Program. Meredeth will ask your household income, and how many people  are in your household. They will quote your out-of-pocket cost based on that information.  [employer] will also be able to set up a 61-month, interest-free payment plan if needed.  Applying the monitor   Shave hair from upper left chest.  Hold abrader disc by orange tab. Rub abrader in 40 strokes over the upper left chest as  indicated in your monitor instructions.  Clean area with 4 enclosed alcohol pads. Let dry.  Apply patch as indicated in monitor instructions. Patch will be placed under collarbone on left  side of chest with arrow pointing upward.  Rub patch adhesive wings for 2 minutes. Remove white label marked 1. Remove the white  label marked 2. Rub patch adhesive wings for 2 additional minutes.  While looking in a mirror, press and release button in center of patch. A small green light will  flash 3-4 times. This will be your only indicator that the monitor has been turned on.  Do not shower for the first 24 hours. You may shower after the first 24 hours.  Press the button if you feel a symptom. You will hear a small click. Record Date, Time and  Symptom in the Patient Logbook.  When you are ready to remove the patch, follow  instructions on  the last 2 pages of Patient  Logbook. Stick patch monitor onto the last page of Patient Logbook.  Place Patient Logbook in the blue and white box. Use locking tab on box and tape box closed  securely. The blue and white box has prepaid postage on it. Please place it in the mailbox as  soon as possible. Your physician should have your test results approximately 7 days after the  monitor has been mailed back to [facility].  Call [facility] Customer Care at [phone] if you have questions regarding  your ZIO XT patch monitor. Call them immediately if you see an orange light blinking on your  monitor.  If your monitor falls off in less than 4 days, contact our Monitor department at [phone].  If your monitor becomes loose or falls off after 4 days call [employer] at [phone] for  suggestions on securing your monitor   Follow-Up: At [facility], you and your health needs are our priority.  As part of our continuing mission to provide you with exceptional heart care, our providers are all part of one team.  This team includes your primary Cardiologist (physician) and Advanced Practice Providers or APPs (Physician Assistants and Nurse Practitioners) who all work together to provide you with the care you need, when you need it.  Your next appointment:   3-4 week(s)  Provider:   Pharm-D            Adopting a Healthy Lifestyle.  Know what a healthy weight is for you (roughly BMI <25) and aim to maintain this   Aim for 7+ servings of fruits and vegetables daily   65-80+ fluid ounces of water or unsweet tea for healthy kidneys   Limit to max 1 drink of alcohol per day; avoid smoking/tobacco   Limit animal fats in diet for cholesterol and heart health - choose grass fed whenever available   Avoid highly processed foods, and foods high in saturated/trans fats   Aim for low stress - take time to unwind and care for your mental health   Aim for 150 min of  moderate intensity exercise weekly for heart health, and weights twice weekly for bone health   Aim for 7-9 hours of sleep daily   When it comes to diets, agreement about the perfect plan isnt easy to find, even among the experts. Experts at the [facility] [employer] developed an idea known as the Healthy Eating Plate. Just imagine a plate divided into logical, healthy portions.   The emphasis is on diet quality:   Load up on vegetables and fruits - one-half of your plate: Aim for color and variety, and remember that potatoes dont count.   Go for whole grains - one-quarter of your plate: Whole wheat, barley, wheat berries, quinoa, oats, brown rice, and foods made with them. If you want pasta, go with whole wheat pasta.   Protein power - one-quarter of your plate: Fish, chicken, beans, and nuts are all healthy, versatile protein sources. Limit red meat.   The diet, however, does go beyond the plate, offering a few other suggestions.   Use healthy plant oils, such as olive, canola, soy, corn, sunflower and peanut. Check the labels, and avoid partially hydrogenated oil, which have unhealthy trans fats.   If youre thirsty, drink water. Coffee and tea are good in moderation, but skip sugary drinks and limit milk and dairy products to one or two daily servings.   The type  of carbohydrate in the diet is more important than the amount. Some sources of carbohydrates, such as vegetables, fruits, whole grains, and beans-are healthier than others.   Finally, stay active  Signed, Dub Huntsman, DO  05/29/2024 12:54 PM    [facility]

## 2024-05-29 NOTE — Patient Instructions (Addendum)
 Medication Instructions:  Your physician has recommended you make the following change in your medication:  START: Cardizem  240 mg once daily  *If you need a refill on your cardiac medications before your next appointment, please call your pharmacy*  Lab Work: Lipids, Lp(a), HgbA1c, Vit D If you have labs (blood work) drawn today and your tests are completely normal, you will receive your results only by: MyChart Message (if you have MyChart) OR A paper copy in the mail If you have any lab test that is abnormal or we need to change your treatment, we will call you to review the results.  Testing/Procedures: GEOFFRY HEWS- Long Term Monitor Instructions  Your physician has requested you wear a ZIO patch monitor for 14 days.  This is a single patch monitor. [employer] supplies one patch monitor per enrollment. Additional stickers are not available. Please do not apply patch if you will be having a Nuclear Stress Test,  Echocardiogram, Cardiac CT, MRI, or Chest Xray during the period you would be wearing the  monitor. The patch cannot be worn during these tests. You cannot remove and re-apply the  ZIO XT patch monitor.  Your ZIO patch monitor will be mailed 3 day USPS to your address on file. It may take 3-5 days  to receive your monitor after you have been enrolled.  Once you have received your monitor, please review the enclosed instructions. Your monitor  has already been registered assigning a specific monitor serial # to you.  Billing and Patient Assistance Program Information  We have supplied [employer] with any of your insurance information on file for billing purposes. [employer] offers a sliding scale Patient Assistance Program for patients that do not have  insurance, or whose insurance does not completely cover the cost of the ZIO monitor.  You must apply for the Patient Assistance Program to qualify for this discounted rate.  To apply, please call [employer] at [phone], select option 4,  select option 2, ask to apply for  Patient Assistance Program. Meredeth will ask your household income, and how many people  are in your household. They will quote your out-of-pocket cost based on that information.  [employer] will also be able to set up a 41-month, interest-free payment plan if needed.  Applying the monitor   Shave hair from upper left chest.  Hold abrader disc by orange tab. Rub abrader in 40 strokes over the upper left chest as  indicated in your monitor instructions.  Clean area with 4 enclosed alcohol pads. Let dry.  Apply patch as indicated in monitor instructions. Patch will be placed under collarbone on left  side of chest with arrow pointing upward.  Rub patch adhesive wings for 2 minutes. Remove white label marked 1. Remove the white  label marked 2. Rub patch adhesive wings for 2 additional minutes.  While looking in a mirror, press and release button in center of patch. A small green light will  flash 3-4 times. This will be your only indicator that the monitor has been turned on.  Do not shower for the first 24 hours. You may shower after the first 24 hours.  Press the button if you feel a symptom. You will hear a small click. Record Date, Time and  Symptom in the Patient Logbook.  When you are ready to remove the patch, follow instructions on the last 2 pages of Patient  Logbook. Stick patch monitor onto the last page of Patient Logbook.  Place Patient Logbook in the blue  and white box. Use locking tab on box and tape box closed  securely. The blue and white box has prepaid postage on it. Please place it in the mailbox as  soon as possible. Your physician should have your test results approximately 7 days after the  monitor has been mailed back to [facility].  Call [facility] Customer Care at [phone] if you have questions regarding  your ZIO XT patch monitor. Call them immediately if you see an orange light blinking on your  monitor.  If your  monitor falls off in less than 4 days, contact our Monitor department at [phone].  If your monitor becomes loose or falls off after 4 days call [employer] at [phone] for  suggestions on securing your monitor   Follow-Up: At [facility], you and your health needs are our priority.  As part of our continuing mission to provide you with exceptional heart care, our providers are all part of one team.  This team includes your primary Cardiologist (physician) and Advanced Practice Providers or APPs (Physician Assistants and Nurse Practitioners) who all work together to provide you with the care you need, when you need it.  Your next appointment:   3-4 week(s)  Provider:   Pharm-D

## 2024-05-29 NOTE — Progress Notes (Unsigned)
 Enrolled patient for a 14 day Zio XT  monitor to be mailed to patients home

## 2024-05-30 ENCOUNTER — Other Ambulatory Visit: Payer: Self-pay | Admitting: Family Medicine

## 2024-05-30 ENCOUNTER — Ambulatory Visit: Payer: Self-pay | Admitting: Cardiology

## 2024-05-30 LAB — LIPID PANEL
Cholesterol, Total: 155 mg/dL (ref 100–199)
HDL: 74 mg/dL (ref >39)
LDL CALC COMMENT:: 2.1 ratio (ref 0.0–4.4)
LDL Chol Calc (NIH): 67 mg/dL (ref 0–99)
Triglycerides: 72 mg/dL (ref 0–149)
VLDL Cholesterol Cal: 14 mg/dL (ref 5–40)

## 2024-05-30 LAB — LIPOPROTEIN A (LPA): Lipoprotein (a): 92.7 nmol/L — AB (ref <75.0)

## 2024-05-30 LAB — VITAMIN D 25 HYDROXY (VIT D DEFICIENCY, FRACTURES): Vit D, 25-Hydroxy: 38 ng/mL (ref 30.0–100.0)

## 2024-05-30 LAB — HEMOGLOBIN A1C
Est. average glucose Bld gHb Est-mCnc: 134 mg/dL
Hgb A1c MFr Bld: 6.3 % — ABNORMAL HIGH (ref 4.8–5.6)

## 2024-05-31 ENCOUNTER — Other Ambulatory Visit: Payer: Self-pay | Admitting: Cardiology

## 2024-06-10 ENCOUNTER — Other Ambulatory Visit: Payer: Self-pay | Admitting: Family Medicine

## 2024-06-10 DIAGNOSIS — E785 Hyperlipidemia, unspecified: Secondary | ICD-10-CM

## 2024-06-30 ENCOUNTER — Encounter: Payer: Self-pay | Admitting: Radiology

## 2024-07-06 NOTE — Progress Notes (Unsigned)
 Patient ID: Angela Hahn                 DOB: 09/21/1975                    MRN: [id number]      HPI: Angela Hahn is a 48 y.o. female patient referred to lipid clinic by Dr. Sheena. PMH is significant for stroke, hypertension, prediabetes, menopause (surgical), HLD, palpitations.  Angela Hahn was seen by Dr. Sheena for management of hyperlipidemia in early October 2025. New labs were drawn to assess risk on 05/29/2024. LDL-C 67, TG 72, TC 155, LP(a) 92.7 while on atorvastatin  40 mg daily. Also checked A1c which returned at 6.3%  I think she's here for elevated Lp(a)? Could connect her to clinical trials? Can ask about BP and palpitations after increase in diltiazem  at last appt   Reviewed options for lowering LDL cholesterol, including ezetimibe, PCSK-9 inhibitors, bempedoic acid and inclisiran.  Discussed mechanisms of action, dosing, side effects and potential decreases in LDL cholesterol.  Also reviewed cost information and potential options for patient assistance.  Current Medications: atorvastatin  40 mg daily Intolerances: n/a Risk Factors: prediabetes, HTN, hx of stroke LDL-C goal: < 70 mg/dL ApoB goal: < 80 mg/dL  Diet:   Exercise:   Family History:  Family History  Problem Relation Age of Onset   Arthritis Mother    Depression Mother    Hypertension Mother    Diabetes Mother    Hypertension Father    Diabetes Father     Social History:  Social Drivers of Health   Tobacco Use: Medium Risk (05/29/2024)   Patient History    Smoking Tobacco Use: Former    Smokeless Tobacco Use: Never    Passive Exposure: Not on Actuary Strain: Not on file  Food Insecurity: No Food Insecurity (05/18/2022)   Hunger Vital Sign    Worried About Running Out of Food in the Last Year: Never true    Ran Out of Food in the Last Year: Never true  Transportation Needs: No Transportation Needs (05/18/2022)   PRAPARE - Administrator, Civil Service (Medical): No    Lack  of Transportation (Non-Medical): No  Physical Activity: Not on file  Stress: Not on file  Social Connections: Not on file  Depression (PHQ2-9): Low Risk  (03/12/2023)   Depression (PHQ2-9)    PHQ-2 Score: 0  Alcohol Screen: Not on file  Housing: Not on file  Utilities: Not on file  Health Literacy: Not on file     Labs: Lipid Panel     Component Value Date/Time   CHOL 155 05/29/2024 1655   TRIG 72 05/29/2024 1655   HDL 74 05/29/2024 1655   CHOLHDL 2.1 05/29/2024 1655   CHOLHDL 2 07/27/2021 1212   VLDL 13.6 07/27/2021 1212   LDLCALC 67 05/29/2024 1655   LABVLDL 14 05/29/2024 1655    Past Medical History:  Diagnosis Date   Anemia    Anxiety    Arthritis    7-8 years ago patient was told she had 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)     Current Outpatient Medications on File Prior to Visit  Medication Sig Dispense Refill   acetaminophen  (  TYLENOL ) 325 MG tablet Take 2 tablets (650 mg total) by mouth every 6 (six) hours as needed. 30 tablet 1   ASPIRIN  LOW DOSE 81 MG tablet TAKE 1 TABLET(81 MG) BY MOUTH DAILY. SWALLOW WHOLE 90 tablet 3   atorvastatin  (LIPITOR) 40 MG tablet TAKE 1 TABLET(40 MG) BY MOUTH DAILY 90 tablet 3   celecoxib  (CELEBREX ) 200 MG capsule TAKE 1 CAPSULE(200 MG) BY MOUTH TWICE DAILY 60 capsule 2   diltiazem  (CARDIZEM  CD) 240 MG 24 hr capsule Take 1 capsule (240 mg total) by mouth daily. 90 capsule 3   LORazepam  (ATIVAN ) 1 MG tablet TAKE 1 TABLET BY MOUTH EVERY 8 HOURS AS NEEDED FOR ANXIETY 90 tablet 5   meclizine  (ANTIVERT ) 25 MG tablet Take 1 tablet (25 mg total) by mouth 3 (three) times daily as needed for dizziness. 60 tablet 5   oxyCODONE -acetaminophen  (PERCOCET/ROXICET) 5-325 MG tablet Take 1 tablet by mouth every 6 (six) hours as needed for severe pain (pain  score 7-10). 20 tablet 0   simethicone  (MYLICON) 80 MG chewable tablet Chew 1 tablet (80 mg total) by mouth 2 (two) times daily after a meal for 14 days. 28 tablet 0   triamcinolone  cream (KENALOG ) 0.1 % Apply 1 Application topically 2 (two) times daily as needed (eczema). 15 g 0   [DISCONTINUED] Cetirizine  HCl 10 MG CAPS Take 1 capsule (10 mg total) by mouth daily. 15 capsule 0   No current facility-administered medications on file prior to visit.    Allergies  Allergen Reactions   Gadolinium Derivatives Itching, Swelling and Cough   Hydrocodone  Other (See Comments)    Itching, takes Benadryl  with this medicine   Tramadol  Other (See Comments)    headache    Assessment/Plan:  1. Hyperlipidemia -  No problem-specific Assessment & Plan notes found for this encounter.    Thank you,  Nidia Schaffer, PharmD PGY2 Cardiology Pharmacy Resident

## 2024-07-07 ENCOUNTER — Telehealth: Payer: Self-pay | Admitting: Cardiology

## 2024-07-07 ENCOUNTER — Ambulatory Visit: Admitting: Pharmacist

## 2024-07-07 ENCOUNTER — Other Ambulatory Visit (HOSPITAL_COMMUNITY): Payer: Self-pay

## 2024-07-07 NOTE — Telephone Encounter (Signed)
 Pt would like a c/b to see if she need to come in for appt with pharmacist due to heart monitor coming off the second day she wore it. Please advise

## 2024-07-17 ENCOUNTER — Other Ambulatory Visit (HOSPITAL_COMMUNITY): Payer: Self-pay

## 2024-08-11 ENCOUNTER — Other Ambulatory Visit (HOSPITAL_BASED_OUTPATIENT_CLINIC_OR_DEPARTMENT_OTHER): Payer: Self-pay

## 2024-08-11 ENCOUNTER — Other Ambulatory Visit: Payer: Self-pay | Admitting: Family Medicine

## 2024-08-11 ENCOUNTER — Other Ambulatory Visit: Payer: Self-pay

## 2024-08-12 ENCOUNTER — Other Ambulatory Visit (HOSPITAL_BASED_OUTPATIENT_CLINIC_OR_DEPARTMENT_OTHER): Payer: Self-pay

## 2024-08-12 MED ORDER — OXYCODONE-ACETAMINOPHEN 5-325 MG PO TABS
1.0000 | ORAL_TABLET | Freq: Four times a day (QID) | ORAL | 0 refills | Status: AC | PRN
  Filled 2024-08-12: qty 20, 5d supply, fill #0

## 2024-08-13 ENCOUNTER — Emergency Department (HOSPITAL_BASED_OUTPATIENT_CLINIC_OR_DEPARTMENT_OTHER): Admitting: Radiology

## 2024-08-13 ENCOUNTER — Encounter (HOSPITAL_BASED_OUTPATIENT_CLINIC_OR_DEPARTMENT_OTHER): Payer: Self-pay | Admitting: *Deleted

## 2024-08-13 ENCOUNTER — Emergency Department (HOSPITAL_BASED_OUTPATIENT_CLINIC_OR_DEPARTMENT_OTHER)
Admission: EM | Admit: 2024-08-13 | Discharge: 2024-08-13 | Disposition: A | Discharge status: Home / Self Care | Attending: Emergency Medicine | Admitting: Emergency Medicine

## 2024-08-13 ENCOUNTER — Other Ambulatory Visit: Payer: Self-pay

## 2024-08-13 DIAGNOSIS — Z8673 Personal history of transient ischemic attack (TIA), and cerebral infarction without residual deficits: Secondary | ICD-10-CM | POA: Diagnosis not present

## 2024-08-13 DIAGNOSIS — Z7982 Long term (current) use of aspirin: Secondary | ICD-10-CM | POA: Insufficient documentation

## 2024-08-13 DIAGNOSIS — R5381 Other malaise: Secondary | ICD-10-CM | POA: Insufficient documentation

## 2024-08-13 DIAGNOSIS — R0789 Other chest pain: Secondary | ICD-10-CM | POA: Diagnosis present

## 2024-08-13 DIAGNOSIS — Z79899 Other long term (current) drug therapy: Secondary | ICD-10-CM | POA: Insufficient documentation

## 2024-08-13 DIAGNOSIS — R42 Dizziness and giddiness: Secondary | ICD-10-CM | POA: Insufficient documentation

## 2024-08-13 DIAGNOSIS — R002 Palpitations: Secondary | ICD-10-CM | POA: Insufficient documentation

## 2024-08-13 LAB — CBC
HCT: 42.6 % (ref 36.0–46.0)
Hemoglobin: 13.6 g/dL (ref 12.0–15.0)
MCH: 26.8 pg (ref 26.0–34.0)
MCHC: 31.9 g/dL (ref 30.0–36.0)
MCV: 84 fL (ref 80.0–100.0)
Platelets: 167 K/uL (ref 150–400)
RBC: 5.07 MIL/uL (ref 3.87–5.11)
RDW: 12.6 % (ref 11.5–15.5)
WBC: 5.9 K/uL (ref 4.0–10.5)
nRBC: 0 % (ref 0.0–0.2)

## 2024-08-13 LAB — BASIC METABOLIC PANEL WITH GFR
Anion gap: 12 (ref 5–15)
BUN: 12 mg/dL (ref 6–20)
CO2: 27 mmol/L (ref 22–32)
Calcium: 10.6 mg/dL — ABNORMAL HIGH (ref 8.9–10.3)
Chloride: 102 mmol/L (ref 98–111)
Creatinine, Ser: 0.66 mg/dL (ref 0.44–1.00)
GFR, Estimated: >60 mL/min (ref >60)
Glucose, Bld: 99 mg/dL (ref 70–99)
Potassium: 3.9 mmol/L (ref 3.5–5.1)
Sodium: 140 mmol/L (ref 135–145)

## 2024-08-13 LAB — TROPONIN T, HIGH SENSITIVITY: Troponin T High Sensitivity: <15 ng/L (ref 0–19)

## 2024-08-13 LAB — MAGNESIUM: Magnesium: 2 mg/dL (ref 1.7–2.4)

## 2024-08-13 LAB — PHOSPHORUS: Phosphorus: 4.3 mg/dL (ref 2.5–4.6)

## 2024-08-13 LAB — CK: Total CK: 70 U/L (ref 38–234)

## 2024-08-13 NOTE — Discharge Instructions (Signed)
 The workup in the ER is overall reassuring. We are not sure what is causing your symptoms. The workup in the ER is not complete, and is limited to screening for life threatening and emergent conditions only, so please see a primary care doctor for further evaluation. Return to the ER if you start having worsening symptoms.

## 2024-08-13 NOTE — ED Notes (Signed)
 Attempt IV x1 in LAC for blood draw without success.

## 2024-08-13 NOTE — ED Triage Notes (Signed)
 Pt is here for evaluation of left sided chest pain which feels like sharp little needle sticks and this began a few days ago and has been intermittent and she has had some lightheadedness with this.  Pt also feels fatigue.  No sob.  Pt was seen at [facility] where they did a EKG and CBG and they sent here here due to prolonged QT compared to EKG in June

## 2024-08-13 NOTE — ED Provider Notes (Signed)
 [place] EMERGENCY DEPARTMENT AT [facility] Provider Note   CSN: [id number] Arrival date & time: 08/13/24  2005     Patient presents with: Chest Pain   Angela Hahn is a 48 y.o. female.   HPI    48 year old female comes in with chief complaint of chest pain, palpitations.  Patient has history of stroke, palpitations and is followed by cardiologist.  Patient reports that she has been having worsening palpitations over the last couple of days.  She also has noted some left-sided chest discomfort.  Symptoms are intermittent and she has associated lightheadedness.  Patient also feels weak, like she did when she needed hysterectomy.  She denies any shortness of breath.  She went to urgent care, they did EKG and told her that she had prolonged QT and advised coming to the ER.  Patient did see any blood in the stool, but she states that her stool today was clay colored.  Prior to Admission medications  Medication Sig Start Date End Date Taking? Authorizing Provider  acetaminophen  (TYLENOL ) 325 MG tablet Take 2 tablets (650 mg total) by mouth every 6 (six) hours as needed. 05/12/22   Izell Harari, MD  ASPIRIN  LOW DOSE 81 MG tablet TAKE 1 TABLET(81 MG) BY MOUTH DAILY. SWALLOW WHOLE 03/13/24   Johnny Garnette LABOR, MD  atorvastatin  (LIPITOR) 40 MG tablet TAKE 1 TABLET(40 MG) BY MOUTH DAILY 06/12/24   Johnny Garnette LABOR, MD  celecoxib  (CELEBREX ) 200 MG capsule TAKE 1 CAPSULE(200 MG) BY MOUTH TWICE DAILY 05/30/24   Johnny Garnette LABOR, MD  diltiazem  (CARDIZEM  CD) 240 MG 24 hr capsule Take 1 capsule (240 mg total) by mouth daily. 05/29/24 09/15/24  Tobb, Kardie, DO  LORazepam  (ATIVAN ) 1 MG tablet TAKE 1 TABLET BY MOUTH EVERY 8 HOURS AS NEEDED FOR ANXIETY 12/03/23   Johnny Garnette LABOR, MD  meclizine  (ANTIVERT ) 25 MG tablet Take 1 tablet (25 mg total) by mouth 3 (three) times daily as needed for dizziness. 03/03/24   Johnny Garnette LABOR, MD  oxyCODONE -acetaminophen  (PERCOCET/ROXICET) 5-325 MG tablet Take 1 tablet  by mouth every 6 (six) hours as needed for severe pain (pain score 7-10). 08/12/24   Johnny Garnette LABOR, MD  simethicone  (MYLICON) 80 MG chewable tablet Chew 1 tablet (80 mg total) by mouth 2 (two) times daily after a meal for 14 days. 05/12/22   Izell Harari, MD  triamcinolone  cream (KENALOG ) 0.1 % Apply 1 Application topically 2 (two) times daily as needed (eczema). 11/14/22   Johnny Garnette LABOR, MD  Cetirizine  HCl 10 MG CAPS Take 1 capsule (10 mg total) by mouth daily. 08/28/19 12/17/20  Wieters, Hallie C, PA-C    Allergies: Gadolinium derivatives, Hydrocodone , and Tramadol     Review of Systems  All other systems reviewed and are negative.   Updated Vital Signs BP (!) 149/85 (BP Location: Right Arm)   Pulse 97   Temp 97.9 F (36.6 C)   Resp 17   LMP 04/26/2022   SpO2 100%   Physical Exam Vitals and nursing note reviewed.  Constitutional:      Appearance: She is well-developed.  HENT:     Head: Atraumatic.  Cardiovascular:     Rate and Rhythm: Normal rate.  Pulmonary:     Effort: Pulmonary effort is normal.  Musculoskeletal:     Cervical back: Normal range of motion and neck supple.  Skin:    General: Skin is warm and dry.  Neurological:     Mental Status: She is alert and  oriented to person, place, and time.     (all labs ordered are listed, but only abnormal results are displayed) Labs Reviewed  BASIC METABOLIC PANEL WITH GFR - Abnormal; Notable for the following components:      Result Value   Calcium  10.6 (*)    All other components within normal limits  CBC  MAGNESIUM  PHOSPHORUS  CK  TSH  TROPONIN T, HIGH SENSITIVITY  TROPONIN T, HIGH SENSITIVITY    EKG: EKG Interpretation Date/Time:  Wednesday August 13 2024 20:13:57 EST Ventricular Rate:  83 PR Interval:  149 QRS Duration:  98 QT Interval:  404 QTC Calculation: 475 R Axis:   -9  Text Interpretation: Sinus rhythm No acute changes normal intervals No significant change since last tracing Confirmed  by Charlyn Sora ([id number]) on 08/13/2024 8:57:32 PM  Radiology: [facility] Chest 2 View Result Date: 08/13/2024 EXAM: 2 VIEW(S) XRAY OF THE CHEST 08/13/2024 08:27:00 PM COMPARISON: 01/17/2024 CLINICAL HISTORY: chest pain FINDINGS: LUNGS AND PLEURA: No focal pulmonary opacity. No pleural effusion. No pneumothorax. HEART AND MEDIASTINUM: No acute abnormality of the cardiac and mediastinal silhouettes. BONES AND SOFT TISSUES: No acute osseous abnormality. IMPRESSION: 1. No acute cardiopulmonary pathology. Electronically signed by: Oneil Devonshire MD 08/13/2024 08:29 PM EST RP Workstation: [id number]     Procedures   Medications Ordered in the ED - No data to display                                  Medical Decision Making Amount and/or Complexity of Data Reviewed Labs: ordered. Radiology: ordered.   48 year old patient comes in with chief complaint of palpitations, chest discomfort, generalized malaise and concerns that she could be having bloody stools.  I have reviewed patient's records including discharge summary and cardiology notes.  It appears that patient has had palpitations in the past and has wore event monitor.  Urgent care center here with QT prolongation.  However, I independently reviewed patient's EKG.  There is no evidence of any QT prolongation.  There is no atrial arrhythmia or and all the intervals appear normal.  QTc is recorded at 470 on the EKG.  Patient's cardiac and pulmonary exam is normal.  She has no abdominal tenderness.  She has no focal neurocomplaints.  Differential diagnosis includes palpitations due to A-fib/flutter, PAC, PVCs, weakness because of severe anemia and GI bleed, severe electrolyte abnormality, AKI, dehydration.   Patient was placed on telemetry.  I have reviewed patient's telemetry strip, she has not had any arrhythmia in the ER.  Heart rate has remained less than 100 the entire time.  Basic labs including troponin is normal.  EKG in the ER is  reassuring. At this time, I feel comfortable discharging the patient.  We will advise that she follows up with her cardiologist.  The patient appears reasonably screened and/or stabilized for discharge and I doubt any other medical condition or other [facility] requiring further screening, evaluation, or treatment in the ED at this time prior to discharge.   Results from the ER workup discussed with the patient face to face and all questions answered to the best of my ability. The patient is safe for discharge with strict return precautions.   Final diagnoses:  Palpitations  Chest discomfort    ED Discharge Orders     None          Charlyn Sora, MD 08/13/24 [id number]

## 2024-08-13 NOTE — ED Notes (Signed)
 Delta trop deferred, pt asymptomatic upon d/c. Plans to f/u w PMD as instructed. Ambulatory out of ED with all belongings.

## 2024-08-15 ENCOUNTER — Other Ambulatory Visit: Payer: Self-pay | Admitting: Family Medicine

## 2024-08-18 NOTE — Telephone Encounter (Signed)
Pt request sent to PCP for advise 

## 2024-08-18 NOTE — Telephone Encounter (Signed)
 Pt LOV was 03/03/24 Last refill was done on 12/03/23 Please advise

## 2024-09-08 NOTE — Telephone Encounter (Addendum)
 Email sent to Angela Hahn requesting a redo monitor to be sent to patient. A response was received stating if the final report for the first order is already posted, [employer] would need a new order to send a new monitor.  The system would not allow her to send one once the report is posted.   Results will be assigned to Dr. Sheena to review.  If additional data needed, a new order would need to be placed.

## 2024-09-19 DIAGNOSIS — R002 Palpitations: Secondary | ICD-10-CM

## 2024-09-29 ENCOUNTER — Other Ambulatory Visit: Payer: Self-pay | Admitting: Family Medicine
